# Patient Record
Sex: Female | Born: 1950 | Race: Black or African American | Hispanic: No | Smoking: Former smoker
Health system: Southern US, Community
[De-identification: ages and names within clinical notes are randomized; demographics above are authoritative.]

## PROBLEM LIST (undated history)

## (undated) DIAGNOSIS — I10 Essential (primary) hypertension: Secondary | ICD-10-CM

## (undated) DIAGNOSIS — M1990 Unspecified osteoarthritis, unspecified site: Secondary | ICD-10-CM

## (undated) DIAGNOSIS — E7800 Pure hypercholesterolemia, unspecified: Secondary | ICD-10-CM

## (undated) HISTORY — PX: TUBAL LIGATION: SHX77

## (undated) HISTORY — PX: BACK SURGERY: SHX140

---

## 2000-01-22 ENCOUNTER — Encounter: Payer: Self-pay | Admitting: Neurosurgery

## 2000-01-22 ENCOUNTER — Ambulatory Visit (HOSPITAL_COMMUNITY): Admission: RE | Admit: 2000-01-22 | Discharge: 2000-01-22 | Payer: Self-pay | Admitting: Neurosurgery

## 2000-02-05 ENCOUNTER — Encounter: Payer: Self-pay | Admitting: Neurosurgery

## 2000-02-06 ENCOUNTER — Inpatient Hospital Stay (HOSPITAL_COMMUNITY): Admission: RE | Admit: 2000-02-06 | Discharge: 2000-02-06 | Payer: Self-pay | Admitting: Neurosurgery

## 2000-03-23 ENCOUNTER — Ambulatory Visit (HOSPITAL_COMMUNITY): Admission: RE | Admit: 2000-03-23 | Discharge: 2000-03-23 | Payer: Self-pay | Admitting: Neurosurgery

## 2000-03-23 ENCOUNTER — Encounter: Payer: Self-pay | Admitting: Neurosurgery

## 2002-10-08 ENCOUNTER — Encounter: Payer: Self-pay | Admitting: Preventative Medicine

## 2002-10-08 ENCOUNTER — Ambulatory Visit (HOSPITAL_COMMUNITY): Admission: RE | Admit: 2002-10-08 | Discharge: 2002-10-08 | Payer: Self-pay | Admitting: Preventative Medicine

## 2002-11-17 ENCOUNTER — Emergency Department (HOSPITAL_COMMUNITY): Admission: EM | Admit: 2002-11-17 | Discharge: 2002-11-17 | Payer: Self-pay | Admitting: Emergency Medicine

## 2004-08-22 ENCOUNTER — Ambulatory Visit (HOSPITAL_COMMUNITY): Admission: RE | Admit: 2004-08-22 | Discharge: 2004-08-22 | Payer: Self-pay | Admitting: Family Medicine

## 2006-12-15 ENCOUNTER — Encounter (INDEPENDENT_AMBULATORY_CARE_PROVIDER_SITE_OTHER): Payer: Self-pay | Admitting: Family Medicine

## 2007-01-15 ENCOUNTER — Ambulatory Visit: Payer: Self-pay | Admitting: Family Medicine

## 2007-01-15 DIAGNOSIS — M5137 Other intervertebral disc degeneration, lumbosacral region: Secondary | ICD-10-CM | POA: Insufficient documentation

## 2007-01-15 DIAGNOSIS — M545 Low back pain, unspecified: Secondary | ICD-10-CM | POA: Insufficient documentation

## 2007-01-15 DIAGNOSIS — K5900 Constipation, unspecified: Secondary | ICD-10-CM | POA: Insufficient documentation

## 2007-01-15 DIAGNOSIS — R5383 Other fatigue: Secondary | ICD-10-CM

## 2007-01-15 DIAGNOSIS — R5381 Other malaise: Secondary | ICD-10-CM | POA: Insufficient documentation

## 2007-01-15 DIAGNOSIS — K589 Irritable bowel syndrome without diarrhea: Secondary | ICD-10-CM | POA: Insufficient documentation

## 2007-01-15 DIAGNOSIS — J42 Unspecified chronic bronchitis: Secondary | ICD-10-CM | POA: Insufficient documentation

## 2007-01-15 DIAGNOSIS — G43909 Migraine, unspecified, not intractable, without status migrainosus: Secondary | ICD-10-CM | POA: Insufficient documentation

## 2007-01-16 ENCOUNTER — Encounter (INDEPENDENT_AMBULATORY_CARE_PROVIDER_SITE_OTHER): Payer: Self-pay | Admitting: Family Medicine

## 2007-01-18 LAB — CONVERTED CEMR LAB
ALT: 19 units/L (ref 0–35)
AST: 21 units/L (ref 0–37)
Albumin: 4.5 g/dL (ref 3.5–5.2)
Alkaline Phosphatase: 147 units/L — ABNORMAL HIGH (ref 39–117)
BUN: 13 mg/dL (ref 6–23)
Basophils Absolute: 0 10*3/uL (ref 0.0–0.1)
Basophils Relative: 0 % (ref 0–1)
CO2: 24 meq/L (ref 19–32)
Calcium: 10 mg/dL (ref 8.4–10.5)
Chloride: 105 meq/L (ref 96–112)
Cholesterol: 203 mg/dL — ABNORMAL HIGH (ref 0–200)
Creatinine, Ser: 0.82 mg/dL (ref 0.40–1.20)
Eosinophils Absolute: 0.4 10*3/uL (ref 0.0–0.7)
Eosinophils Relative: 4 % (ref 0–5)
Glucose, Bld: 101 mg/dL — ABNORMAL HIGH (ref 70–99)
HCT: 40.8 % (ref 36.0–46.0)
HDL: 65 mg/dL (ref >39)
Hemoglobin: 13.2 g/dL (ref 12.0–15.0)
LDL Cholesterol: 118 mg/dL — ABNORMAL HIGH (ref 0–99)
Lymphocytes Relative: 21 % (ref 12–46)
Lymphs Abs: 1.8 10*3/uL (ref 0.7–3.3)
MCHC: 32.4 g/dL (ref 30.0–36.0)
MCV: 92.9 fL (ref 78.0–100.0)
Monocytes Absolute: 0.6 10*3/uL (ref 0.2–0.7)
Monocytes Relative: 7 % (ref 3–11)
Neutro Abs: 6 10*3/uL (ref 1.7–7.7)
Neutrophils Relative %: 68 % (ref 43–77)
Platelets: 333 10*3/uL (ref 150–400)
Potassium: 4.7 meq/L (ref 3.5–5.3)
RBC: 4.39 M/uL (ref 3.87–5.11)
RDW: 14 % (ref 11.5–14.0)
Sodium: 141 meq/L (ref 135–145)
TSH: 1.022 microintl units/mL (ref 0.350–5.50)
Total Bilirubin: 0.4 mg/dL (ref 0.3–1.2)
Total CHOL/HDL Ratio: 3.1
Total Protein: 7.7 g/dL (ref 6.0–8.3)
Triglycerides: 99 mg/dL (ref <150)
VLDL: 20 mg/dL (ref 0–40)
WBC: 8.8 10*3/uL (ref 4.0–10.5)

## 2007-01-19 ENCOUNTER — Telehealth (INDEPENDENT_AMBULATORY_CARE_PROVIDER_SITE_OTHER): Payer: Self-pay | Admitting: *Deleted

## 2007-01-24 ENCOUNTER — Emergency Department (HOSPITAL_COMMUNITY): Admission: EM | Admit: 2007-01-24 | Discharge: 2007-01-24 | Payer: Self-pay | Admitting: Emergency Medicine

## 2007-02-03 ENCOUNTER — Encounter (INDEPENDENT_AMBULATORY_CARE_PROVIDER_SITE_OTHER): Payer: Self-pay | Admitting: Family Medicine

## 2007-02-04 ENCOUNTER — Ambulatory Visit: Payer: Self-pay | Admitting: Family Medicine

## 2007-02-04 ENCOUNTER — Telehealth (INDEPENDENT_AMBULATORY_CARE_PROVIDER_SITE_OTHER): Payer: Self-pay | Admitting: *Deleted

## 2007-02-04 DIAGNOSIS — S63509A Unspecified sprain of unspecified wrist, initial encounter: Secondary | ICD-10-CM | POA: Insufficient documentation

## 2007-02-04 DIAGNOSIS — E785 Hyperlipidemia, unspecified: Secondary | ICD-10-CM | POA: Insufficient documentation

## 2007-02-04 LAB — CONVERTED CEMR LAB
Cholesterol, target level: 200 mg/dL
HDL goal, serum: 40 mg/dL
LDL Goal: 160 mg/dL

## 2007-03-24 ENCOUNTER — Telehealth (INDEPENDENT_AMBULATORY_CARE_PROVIDER_SITE_OTHER): Payer: Self-pay | Admitting: Family Medicine

## 2007-03-28 ENCOUNTER — Encounter (INDEPENDENT_AMBULATORY_CARE_PROVIDER_SITE_OTHER): Payer: Self-pay | Admitting: Family Medicine

## 2009-02-26 ENCOUNTER — Emergency Department (HOSPITAL_COMMUNITY): Admission: EM | Admit: 2009-02-26 | Discharge: 2009-02-26 | Payer: Self-pay | Admitting: Emergency Medicine

## 2009-10-24 ENCOUNTER — Emergency Department (HOSPITAL_COMMUNITY): Admission: EM | Admit: 2009-10-24 | Discharge: 2009-10-24 | Payer: Self-pay | Admitting: Emergency Medicine

## 2010-05-06 ENCOUNTER — Emergency Department (HOSPITAL_COMMUNITY)
Admission: EM | Admit: 2010-05-06 | Discharge: 2010-05-06 | Payer: Self-pay | Source: Home / Self Care | Admitting: Emergency Medicine

## 2010-05-08 NOTE — Assessment & Plan Note (Signed)
Summary: new patient/arc   Vital Signs:  Patient Profile:   59 Years Old Female Height:     66 inches Weight:      172 pounds BMI:     27.86 O2 Sat:      99 % Temp:     98.0 degrees F Pulse rate:   69 / minute Resp:     14 per minute BP sitting:   157 / 94  Vitals Entered By: Sherilyn Banker (January 15, 2007 1:33 PM)                 PCP:  Franchot Heidelberg, MD  Chief Complaint:  establish.  History of Present Illness: Pt in for establishment.  She was just certified as disabled by [employer]. She had surgery under Dr. Aliene Beams. She had multiple slipped discs and she had numbness and tingling in her left leg after. This was done in 2001. 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 This involved hard physical labor and she had to lie on her back, strecth, shvel, stoop and crawl. She was not supposed to lift more than 50 pounds. She lost this job as she could not perform functions for it. This happened Mar 10, 2007. She applied for diasbility in June of this year. She never had an FCE and never went to vocational rehab. Saw Gaurino and he did physical for disability. She brings in note from him and this shows she had chronic low back pain with left leg radiation post back surgery.  He could not speak to fact if she had maximal improvement or not. She was granted disability.  She stopped her car payments i.e. payed half for the last few months. SHe has not made payment this month. She needs me to complete statement on her disability for the insurance company and this will reliquish her from making payments.  The paperwork is for [employer] and the second is for [employer].  She now presents.  Current Allergies (reviewed today): ! ASA   Family History:    Reviewed history and no changes required:       Father: deceased, Lung CA       Mother: 3, low back pain, HTN, arthritis       3 Brothers: 59, 37, 68, HTN, Kidney problems       2  sisters: 55, 50, HTN       3 Children: 12, 35, glaucoma, 40  Social History:    Reviewed history and no changes required:       Single       Former Smoker       Alcohol use-no       Drug use-no   Risk Factors:  Tobacco use:  quit    Year quit:  1985    Comments:  1/2 pack for 12 years Drug use:  no Alcohol use:  no   Review of Systems      See HPI  General      Complains of fatigue and malaise.      She does not sleepthe best. Back hurts. States pain is 8/10. Describes as sharp,shoots down left leg. Has not had repeat MRI since then. Usually uses Excedrin. Not helping much. No fecal or urinary incontinence. Left leg numb and left foot numb as well with little to no sensation.  Eyes      Denies discharge, halos, and red eye.  ENT  Denies decreased hearing, earache, ringing in ears, and sore throat.  CV      Denies bluish discoloration of lips or nails, difficulty breathing at night, near fainting, and weight gain.  Resp      Denies chest discomfort, shortness of breath, sputum productive, and wheezing.  GI      Denies abdominal pain, constipation, diarrhea, nausea, and vomiting.      Ha snever had colonscopy  GU      Denies abnormal vaginal bleeding, nocturia, urinary frequency, and urinary hesitancy.      Has not had female exam.  Derm      Denies changes in nail beds, hair loss, and lesion(s).  Neuro      See HPI      Denies brief paralysis, falling down, memory loss, and visual disturbances.  Psych      Denies anxiety and depression.      She denies depression and anxiety. Has done well getting use to not working. Does not have all her friends. She is irritable at times. Concentration good. energy level is good. No suicidal ideations. She has kids and they listen to her - helps her.  Endo      Denies cold intolerance, excessive hunger, excessive thirst, excessive urination, heat intolerance, and polyuria.  Heme      Denies abnormal bruising,  bleeding, enlarge lymph nodes, fevers, pallor, and skin discoloration.     Impression & Recommendations:  Problem # 1:  DISC DISEASE, LUMBOSACRAL SPINE (ICD-722.52) Discussed. Get records. Start Medrol pack,flexeril and Ultram. Councelled back stretching. Advised heating pad and possible repeat MRI. Await response to medications as well as records, then optomize. Cannot complete paperwrok untilmore info available. Notes request from Dr. Jeanelle Malling. See scanned document on Dr. Wende Crease.  Problem # 2:  ANEMIA NOS (ICD-285.9) Hx of. Check labs. Orders: T-CBC w/Diff ([id number])   Problem # 3:  CONSTIPATION (ICD-564.00) Push fluids, fiber and exersize. Labs. Orders: T-Comprehensive Metabolic Panel ([id number])   Problem # 4:  Preventive Health Care (ICD-V70.0) Fl-shot today. Female exam near future as well as referal for mammogram and screening colonoscopy.  Complete Medication List: 1)  Excedrin Sinus Headache 5-325 Mg Tabs (Phenylephrine-acetaminophen) .... As needed 2)  Medrol (pak) 4 Mg Tabs (Methylprednisolone) .... As directed 3)  Flexeril 10 Mg Tabs (Cyclobenzaprine hcl) .... Three times a day 4)  Ultram 50 Mg Tabs (Tramadol hcl) .... One every 6 hours as needed  Other Orders: T-Lipid Profile [phone]) T-TSH [phone]) Flu Vaccine 46yrs + [phone]) Admin 1st Vaccine ([id number])   Patient Instructions: 1)  Please schedule a follow-up appointment in 2 weeks.    Prescriptions: ULTRAM 50 MG  TABS (TRAMADOL HCL) One every 6 hours as needed  #60 x 0   Entered and Authorized by:   Franchot Heidelberg MD   Signed by:   Franchot Heidelberg MD on 01/15/2007   Method used:   Print then Give to Patient   RxID:   [id number] FLEXERIL 10 MG  TABS (CYCLOBENZAPRINE HCL) three times a day  #15 x 0   Entered and Authorized by:   Franchot Heidelberg MD   Signed by:   Franchot Heidelberg MD on 01/15/2007   Method used:   Print then Give to Patient   RxID:   [id number]  MEDROL (PAK) 4 MG  TABS (METHYLPREDNISOLONE) As directed  #1 x 0   Entered and Authorized by:   Franchot Heidelberg MD   Signed by:   Franchot Heidelberg MD on 01/15/2007  Method used:   Print then Give to Patient   RxID:   [id number]  ]  Hepatitis B Immunization History:    Hep B # 1:  Historical (01/06/2001)  Tetanus/Td Immunization History:    Tetanus/Td # 1:  Historical (01/07/2003)  Hepatitis A Immunization History:    Hep A # 1:  Historical (01/06/2001)     Influenza Vaccine    Vaccine Type: Fluvax 3+    Site: left deltoid    Mfr: novartis    Dose: 0.5 ml    Route: IM    Given by: Sherilyn Banker    Exp. Date: 08/07/2007    VIS given: 10/05/04 version given January 15, 2007.  Flu Vaccine Consent Questions    Do you have a history of severe allergic reactions to this vaccine? no    Any prior history of allergic reactions to egg and/or gelatin? no    Do you have a sensitivity to the preservative Thimersol? no    Do you have a past history of Guillan-Barre Syndrome? no    Do you currently have an acute febrile illness? no    Have you ever had a severe reaction to latex? no    Vaccine information given and explained to patient? yes    Are you currently pregnant? no

## 2010-05-08 NOTE — Assessment & Plan Note (Signed)
Summary: 2 week follow up/arc   Vital Signs:  Patient Profile:   60 Years Old Female Height:     66 inches Weight:      173 pounds BMI:     28.02 O2 Sat:      97 % Temp:     97.1 degrees F Pulse rate:   75 / minute Resp:     12 per minute BP sitting:   135 / 89  Vitals Entered By: Sherilyn Banker (February 04, 2007 2:17 PM)                 PCP:  Franchot Heidelberg, MD  Chief Complaint:  Lwrist pain.  History of Present Illness: Pt in for follow-up.  She notes she is doing well.  She had labs after last visit and this is reviewed:  1. CBC - normal 2. CMP - Alk Phos 147, Glucose 101 3. TSH - normal 4. Lipids - see below  She is disabled due to her DDD. She had surgery under Dr. Gerlene Fee in 2000. She was relaed back to work. She never went bck to see him once she developed problems. She could not do her job stooping and lifting at the [employer] and hence applied for disability. She had a diasbility physical and the states gave her disability Mar 11, 2007. She needs paperwork completed. This is for [employer]. She has a car payment and this is for [employer]. She states she has made payment but has now fell behind due to her disability.   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 She is not sure what she can do. Has not had an FCE eval and has not seen vocationbal rehab.  She now presents.       Lipid Management History:      Positive NCEP/ATP III risk factors include female age 81 years old or older.  Negative NCEP/ATP III risk factors include no history of early menopause without estrogen hormone replacement, non-diabetic, HDL cholesterol greater than 60, no family history for ischemic heart disease, non-tobacco-user status, non-hypertensive, no ASHD (atherosclerotic heart disease), no prior stroke/TIA, no peripheral vascular disease, and no history of aortic aneurysm.     Current  Allergies (reviewed today): ! ASA  Past Surgical History:    Reviewed history and no changes required:       Laminectomy L5 and S1 with microdissection L5-S1   Family History:    Reviewed history from 01/15/2007 and no changes required:       Father: deceased, Lung CA       Mother: 41, low back pain, HTN, arthritis       3 Brothers: 14, 2, 58, HTN, Kidney problems       2 sisters: 107, 78, HTN       3 Children: 47, 35, glaucoma, 40  Social History:    Reviewed history from 01/15/2007 and no changes required:       Single       Former Smoker       Alcohol use-no       Drug use-no    Review of Systems  General      Denies fatigue and malaise.  Resp      Denies chest discomfort, shortness of breath, sputum productive, and wheezing.  GI      Denies abdominal pain, constipation, diarrhea, nausea, and vomiting.  GU  Denies abnormal vaginal bleeding, nocturia, urinary frequency, and urinary hesitancy.  MS      She fell a few weeks ago.Went to [facility]. Had xrays. Told all was well and she just had sprain. Started pain med and brace and this has helped.States pain is worse with flexion and better with pain medication.   Physical Exam  General:     Well-developed,well-nourished,in no acute distress; alert,appropriate and cooperative throughout examination Lungs:     Normal respiratory effort, chest expands symmetrically. Lungs are clear to auscultation, no crackles or wheezes. Heart:     Normal rate and regular rhythm. S1 and S2 normal without gallop, murmur, click, rub or other extra sounds. Abdomen:     Bowel sounds positive,abdomen soft and non-tender without masses, organomegaly or hernias noted. Extremities:     No clubbing, cyanosis, edema, or deformity noted with normal full range of motion of all joints.  Left witrst exam done.Good ROM. Pain on flexion and extension and medial rotation. Normal cap fill and reflexes. Neurologic:     No cranial nerve  deficits noted. Station and gait are normal. Plantar reflexes are down-going bilaterally. DTRs are symmetrical throughout. Sensory, motor and coordinative functions appear intact. Psych:     Cognition and judgment appear intact. Alert and cooperative with normal attention span and concentration. No apparent delusions, illusions, hallucinations    Impression & Recommendations:  Problem # 1:  DISC DISEASE, LUMBOSACRAL SPINE (ICD-722.52) Discussed. I have seen her twice. She needs paperwork completed to help her with her car payments. She has fallen behind since she got disabled. I advised her I have only seen her twice. We will fill paoperwork out as best I can. I feel however she is a good candidate for vocational rehab and we will get an FCE eval to see what her level of impairment is. She agrees. I councelled her on diet, exersize. Await FCE and optomize.  Problem # 2:  HYPERLIPIDEMIA (ICD-272.4) TLC only. Recheck one year.  Problem # 3:  WRIST SPRAIN, LEFT (ICD-842.00) Improved.Normal films per Echart review. Advised left wrist brace, ROM exersizes and need for heat. DC Narcotic and switch to naproxen 500 mg two times a day for ten days given need for ant-inflammatory action. Agrees.  Problem # 4:  Preventive Health Care (ICD-V70.0) Female exam in 4 weeks. Note up to date for flu-shot. She needs breast exam, mammo, pelvic, dexa based on elevated alk phos and screening colonoscopy. Agrees.  Complete Medication List: 1)  Excedrin Sinus Headache 5-325 Mg Tabs (Phenylephrine-acetaminophen) .... As needed 2)  Medrol (pak) 4 Mg Tabs (Methylprednisolone) .... As directed 3)  Flexeril 10 Mg Tabs (Cyclobenzaprine hcl) .... Three times a day 4)  Ultram 50 Mg Tabs (Tramadol hcl) .... One every 6 hours as needed  Lipid Assessment/Plan:      Based on NCEP/ATP III, the patient's risk factor category is "0-1 risk factors".  From this information, the patient's calculated lipid goals are as follows:  Total cholesterol goal is 200; LDL cholesterol goal is 160; HDL cholesterol goal is 40; Triglyceride goal is 150.     Patient Instructions: 1)  Please schedule a follow-up appointment in 1 month.    ]    Preventive Care Screening  Last Flu Shot:    Next Due:  01/2008  Last Tetanus Booster:    Next Due:  01/2013

## 2010-05-08 NOTE — Letter (Signed)
Summary: diablity forms  diablity forms   Imported By: Donneta Romberg 04/01/2007 11:36:10  _____________________________________________________________________  External Attachment:    Type:   Image     Comment:   External Document

## 2010-05-08 NOTE — Progress Notes (Signed)
Summary: FCE referral  Phone Note Outgoing Call   Call placed by: Sonny Dandy,  February 04, 2007 3:22 PM Summary of Call: order faxed to PT and patient notified .................................................................Marland KitchenMarland KitchenSonny Dandy  February 04, 2007 3:22 PM  Initial call taken by: Sonny Dandy,  February 04, 2007 3:23 PM

## 2010-05-08 NOTE — Progress Notes (Signed)
Summary: Disability  Phone Note Other Incoming   Summary of Call: patient came to office asking about paperwork to deam her disabled. Brought paperwork for physician to fill out to say that she was disabled. I contacted patient to let her know that due to the fact that she didn't go for her [facility] evaluation he can not deam that she is disabled. This evaluation has to be done for further paperwork can be filled out.  patient verbalized understanding. Initial call taken by: Donneta Romberg,  March 24, 2007 4:54 PM  Follow-up for Phone Call        Agree. Follow-up by: Franchot Heidelberg MD,  March 24, 2007 4:58 PM

## 2010-05-08 NOTE — Letter (Signed)
Summary: Evaluation form for disability determination services  Evaluation form for disability determination services   Imported By: Pablo Lawrence 01/19/2007 16:38:57  _____________________________________________________________________  External Attachment:    Type:   Image     Comment:   External Document

## 2010-05-08 NOTE — Miscellaneous (Signed)
Summary: Orders Update   Clinical Lists Changes  Orders: Added new Referral order of Physical Therapy Referral (PT) - Signed 

## 2010-05-08 NOTE — Letter (Signed)
Summary: letter for disablity   letter for disablity   Imported By: Donneta Romberg 04/01/2007 11:35:46  _____________________________________________________________________  External Attachment:    Type:   Image     Comment:   External Document

## 2010-05-08 NOTE — Letter (Signed)
Summary: Neuro Surgery-Dr. Gerlene Fee  Neuro Surgery-Dr. Gerlene Fee   Imported By: Lutricia Horsfall 02/03/2007 15:50:40  _____________________________________________________________________  External Attachment:    Type:   Image     Comment:   External Document

## 2010-05-08 NOTE — Letter (Signed)
Summary: DISABLITY BENEFIT INFO  DISABLITY BENEFIT INFO   Imported By: Donneta Romberg 02/10/2007 10:17:29  _____________________________________________________________________  External Attachment:    Type:   Image     Comment:   External Document

## 2010-05-08 NOTE — Progress Notes (Signed)
Summary: 01/16/07 lab results  Phone Note Outgoing Call   Call placed by: Sonny Dandy,  January 19, 2007 12:42 PM Summary of Call: Results given to patient, voices understanding .................................................................Marland KitchenMarland KitchenSonny Dandy  January 19, 2007 12:42 PM   Initial call taken by: Sonny Dandy,  January 19, 2007 12:42 PM

## 2010-06-23 LAB — URINALYSIS, ROUTINE W REFLEX MICROSCOPIC
Bilirubin Urine: NEGATIVE
Glucose, UA: NEGATIVE mg/dL
Hgb urine dipstick: NEGATIVE
Ketones, ur: NEGATIVE mg/dL
Nitrite: NEGATIVE
Protein, ur: 30 mg/dL — AB
Specific Gravity, Urine: >1.03 — ABNORMAL HIGH (ref 1.005–1.030)
Urobilinogen, UA: 0.2 mg/dL (ref 0.0–1.0)
pH: 5.5 (ref 5.0–8.0)

## 2010-06-23 LAB — CBC
HCT: 38.5 % (ref 36.0–46.0)
Hemoglobin: 13.1 g/dL (ref 12.0–15.0)
MCH: 30.8 pg (ref 26.0–34.0)
MCHC: 33.9 g/dL (ref 30.0–36.0)
MCV: 90.7 fL (ref 78.0–100.0)
Platelets: 257 10*3/uL (ref 150–400)
RBC: 4.24 MIL/uL (ref 3.87–5.11)
RDW: 13.9 % (ref 11.5–15.5)
WBC: 11.6 10*3/uL — ABNORMAL HIGH (ref 4.0–10.5)

## 2010-06-23 LAB — BASIC METABOLIC PANEL
BUN: 10 mg/dL (ref 6–23)
BUN: 9 mg/dL (ref 6–23)
CO2: 25 mEq/L (ref 19–32)
CO2: 26 mEq/L (ref 19–32)
Calcium: 9.1 mg/dL (ref 8.4–10.5)
Calcium: 9.9 mg/dL (ref 8.4–10.5)
Chloride: 103 mEq/L (ref 96–112)
Chloride: 109 mEq/L (ref 96–112)
Creatinine, Ser: 1.05 mg/dL (ref 0.4–1.2)
Creatinine, Ser: 1.34 mg/dL — ABNORMAL HIGH (ref 0.4–1.2)
GFR calc Af Amer: 49 mL/min — ABNORMAL LOW (ref >60)
GFR calc Af Amer: >60 mL/min (ref >60)
GFR calc non Af Amer: 40 mL/min — ABNORMAL LOW (ref >60)
GFR calc non Af Amer: 53 mL/min — ABNORMAL LOW (ref >60)
Glucose, Bld: 125 mg/dL — ABNORMAL HIGH (ref 70–99)
Glucose, Bld: 99 mg/dL (ref 70–99)
Potassium: 3.8 mEq/L (ref 3.5–5.1)
Potassium: 4.1 mEq/L (ref 3.5–5.1)
Sodium: 138 mEq/L (ref 135–145)
Sodium: 140 mEq/L (ref 135–145)

## 2010-06-23 LAB — URINE MICROSCOPIC-ADD ON

## 2010-06-23 LAB — DIFFERENTIAL
Basophils Absolute: 0 10*3/uL (ref 0.0–0.1)
Basophils Relative: 0 % (ref 0–1)
Eosinophils Absolute: 0.4 10*3/uL (ref 0.0–0.7)
Eosinophils Relative: 4 % (ref 0–5)
Lymphocytes Relative: 13 % (ref 12–46)
Lymphs Abs: 1.5 10*3/uL (ref 0.7–4.0)
Monocytes Absolute: 0.8 10*3/uL (ref 0.1–1.0)
Monocytes Relative: 7 % (ref 3–12)
Neutro Abs: 8.8 10*3/uL — ABNORMAL HIGH (ref 1.7–7.7)
Neutrophils Relative %: 76 % (ref 43–77)

## 2010-06-23 LAB — POCT CARDIAC MARKERS
CKMB, poc: 1 ng/mL (ref 1.0–8.0)
Myoglobin, poc: 355 ng/mL (ref 12–200)
Troponin i, poc: <0.05 ng/mL (ref 0.00–0.09)

## 2010-06-23 LAB — URINE CULTURE: Colony Count: 80000

## 2010-07-22 ENCOUNTER — Ambulatory Visit (HOSPITAL_COMMUNITY): Payer: Medicare Other

## 2010-08-24 NOTE — Op Note (Signed)
[place]  Patient:    Kathryn Woods, Kathryn Woods                     MRN: [id number] Proc. Date: 02/05/00 Adm. Date:  [id number] Attending:  Gerald Dexter                           Operative Report  PREOPERATIVE DIAGNOSIS:  Herniated disk L5-S1 left.  POSTOPERATIVE DIAGNOSIS:  Herniated disk L5-S1 left.  PROCEDURE: 1. Left L5-S1 interlaminar laminotomy for excision of herniated disk without    operating microscope. 2. Microdissection L5-S1 disk and S1 nerve root.  SURGEON:  Reinaldo Meeker, M.D.  ASSISTANT:  Julio Sicks, M.D.  PROCEDURE IN DETAIL:  After being placed in the prone position, the patients back was prepped and draped in the usual sterile fashion.  A localizing x-ray was taken prior to incision to identify the L5-S1 level.  A midline incision was made over the spinous process of L5-S1.  Using the Bovie cautery and curet the incision was carried down to the spinous processes.  Subperiosteal dissection was then carried out on the left side of the spinous processes and lamina and the McCullough self-retaining retractor was placed for exposure.  A second x-ray was taken to confirm approach to the L5-S1 level, and this was correct.  Using the high speed drill, the inferior one third of the L5 lamina and the medial one third of the facet joint were removed.  The drill was then used to remove the superior one third of the S1 lamina.  Any residual bone and ligamentum flavum were removed in a piecemeal fashion.  The microscope was then draped, brought into the field and used for the remainder of the case. Using microdissection technique, the lateral aspect of the thecal sac and S1 nerve were identified.  Further coagulation was carried out down the ______ canal to identify the L5-S1 disk which was markedly herniated with extension into the superior area towards the foramen.  The annulus of the disk was incised.  Using pituitary rongeurs and  curets a thorough disk space clean out was carried out.  Inspection in the superolateral direction above the disk space showed 2 large fragments of subligamentous disk and these were removed to give excellent decompression of the thecal sac and L5, and S1 nerve roots.  At this point inspection was carried out in all directions for any evidence of residual compression and none could be identified.  Large amounts of irrigation were carried out and bleeding controlled with bipolar coagulation and Gelfoam.  The wound was then closed using interrupted Vicryl in the muscle, fascia, subcutaneous and subcuticular tissues and staples on the skin.  A sterile dressing was then applied.  The patient was extubated and taken to the recovery room in stable condition. DD:  02/05/00 TD:  02/05/00 Job: [id number]

## 2010-09-09 ENCOUNTER — Emergency Department (HOSPITAL_COMMUNITY)
Admission: EM | Admit: 2010-09-09 | Discharge: 2010-09-09 | Disposition: A | Discharge status: Home / Self Care | Payer: No Typology Code available for payment source | Attending: Emergency Medicine | Admitting: Emergency Medicine

## 2010-09-09 ENCOUNTER — Emergency Department (HOSPITAL_COMMUNITY): Admission: EM | Admit: 2010-09-09 | Payer: Medicare Other | Source: Home / Self Care

## 2010-09-09 DIAGNOSIS — M542 Cervicalgia: Secondary | ICD-10-CM | POA: Insufficient documentation

## 2010-09-09 DIAGNOSIS — M25519 Pain in unspecified shoulder: Secondary | ICD-10-CM | POA: Insufficient documentation

## 2010-09-09 DIAGNOSIS — I10 Essential (primary) hypertension: Secondary | ICD-10-CM | POA: Insufficient documentation

## 2010-09-09 DIAGNOSIS — S139XXA Sprain of joints and ligaments of unspecified parts of neck, initial encounter: Secondary | ICD-10-CM | POA: Insufficient documentation

## 2010-12-30 ENCOUNTER — Emergency Department (HOSPITAL_COMMUNITY)
Admission: EM | Admit: 2010-12-30 | Discharge: 2010-12-30 | Disposition: A | Discharge status: Home / Self Care | Payer: Medicare PPO | Attending: Emergency Medicine | Admitting: Emergency Medicine

## 2010-12-30 ENCOUNTER — Encounter: Payer: Self-pay | Admitting: *Deleted

## 2010-12-30 DIAGNOSIS — Z87891 Personal history of nicotine dependence: Secondary | ICD-10-CM | POA: Insufficient documentation

## 2010-12-30 DIAGNOSIS — L0231 Cutaneous abscess of buttock: Secondary | ICD-10-CM | POA: Insufficient documentation

## 2010-12-30 MED ORDER — DOXYCYCLINE HYCLATE 100 MG PO CAPS: 100.0000 mg | ORAL_CAPSULE | Freq: Two times a day (BID) | ORAL | Status: AC

## 2010-12-30 MED ORDER — HYDROCODONE-ACETAMINOPHEN 5-325 MG PO TABS: ORAL_TABLET | ORAL | Status: DC

## 2010-12-30 MED ORDER — IBUPROFEN 800 MG PO TABS
800.0000 mg | ORAL_TABLET | Freq: Once | ORAL | Status: AC
  Administered 2010-12-30: 800 mg via ORAL
  Filled 2010-12-30: qty 1

## 2010-12-30 MED ORDER — DOXYCYCLINE HYCLATE 100 MG PO TABS
100.0000 mg | ORAL_TABLET | Freq: Once | ORAL | Status: AC
  Administered 2010-12-30: 100 mg via ORAL
  Filled 2010-12-30: qty 1

## 2010-12-30 NOTE — ED Provider Notes (Signed)
History     CSN: [id number] Arrival date & time: 12/30/2010 10:19 AM  Chief Complaint  Patient presents with  . Abscess    HPI  (Consider location/radiation/quality/duration/timing/severity/associated sxs/prior treatment)  Patient is a 60 y.o. female presenting with abscess. The history is provided by the patient. No language interpreter was used.  Abscess  This is a new problem. Episode onset: started 5 days ago. The onset was gradual. The problem has been gradually worsening. The abscess is present on the right buttock. The problem is moderate. The abscess is characterized by swelling (redness and induration). Pertinent negatives include no fever. Her past medical history does not include skin abscesses in family. There were no sick contacts. She has received no recent medical care.    History reviewed. No pertinent past medical history.  Past Surgical History  Procedure Date  . Back surgery     History reviewed. No pertinent family history.  History  Substance Use Topics  . Smoking status: Former Games developer  . Smokeless tobacco: Not on file  . Alcohol Use: No    OB History    Grav Para Term Preterm Abortions TAB SAB Ect Mult Living                  Review of Systems  Review of Systems  Constitutional: Negative for fever.  Skin:       abscess  All other systems reviewed and are negative.    Allergies  Aspirin  Home Medications   Current Outpatient Rx  Name Route Sig Dispense Refill  . IBUPROFEN 800 MG PO TABS Oral Take 800 mg by mouth every 8 (eight) hours as needed. For pain       Physical Exam    BP 132/90  Pulse 86  Temp(Src) 97.5 F (36.4 C) (Oral)  Resp 18  Ht 5\' 6"  (1.676 m)  Wt 158 lb (71.668 kg)  BMI 25.50 kg/m2  SpO2 97%  Physical Exam  Nursing note and vitals reviewed. Constitutional: She is oriented to person, place, and time. She appears well-developed and well-nourished. No distress.  HENT:  Head: Normocephalic and atraumatic.    Eyes: EOM are normal.  Neck: Normal range of motion.  Cardiovascular: Normal rate, regular rhythm and normal heart sounds.   Pulmonary/Chest: Effort normal and breath sounds normal.  Abdominal: Soft. She exhibits no distension. There is no tenderness.  Musculoskeletal: Normal range of motion. She exhibits tenderness.  Neurological: She is alert and oriented to person, place, and time.  Skin: Skin is warm and dry. Lesion noted. She is not diaphoretic.     Psychiatric: She has a normal mood and affect. Judgment normal.    ED Course  Procedures (including critical care time)  Labs Reviewed - No data to display No results found.   No diagnosis found.   MDM         Worthy Rancher, PA 12/30/10 1110

## 2010-12-30 NOTE — ED Notes (Signed)
Pt c/o abscess to right buttock; pt states it is painful and has caused her to not be able to sleep

## 2010-12-30 NOTE — ED Provider Notes (Signed)
Medical screening examination/treatment/procedure(s) were performed by non-physician practitioner and as supervising physician I was immediately available for consultation/collaboration.   Shelda Jakes, MD 12/30/10 [phone]

## 2013-02-15 ENCOUNTER — Emergency Department (HOSPITAL_COMMUNITY): Payer: Medicare Other

## 2013-02-15 ENCOUNTER — Encounter (HOSPITAL_COMMUNITY): Payer: Self-pay | Admitting: Emergency Medicine

## 2013-02-15 ENCOUNTER — Emergency Department (HOSPITAL_COMMUNITY)
Admission: EM | Admit: 2013-02-15 | Discharge: 2013-02-15 | Disposition: A | Discharge status: Home / Self Care | Payer: Medicare Other | Attending: Emergency Medicine | Admitting: Emergency Medicine

## 2013-02-15 DIAGNOSIS — M25511 Pain in right shoulder: Secondary | ICD-10-CM

## 2013-02-15 DIAGNOSIS — Z9889 Other specified postprocedural states: Secondary | ICD-10-CM | POA: Insufficient documentation

## 2013-02-15 DIAGNOSIS — M25519 Pain in unspecified shoulder: Secondary | ICD-10-CM | POA: Insufficient documentation

## 2013-02-15 DIAGNOSIS — Z87891 Personal history of nicotine dependence: Secondary | ICD-10-CM | POA: Insufficient documentation

## 2013-02-15 MED ORDER — NAPROXEN 375 MG PO TABS: 375.0000 mg | ORAL_TABLET | Freq: Two times a day (BID) | ORAL | Status: DC | PRN

## 2013-02-15 MED ORDER — DIAZEPAM 5 MG PO TABS: 5.0000 mg | ORAL_TABLET | Freq: Three times a day (TID) | ORAL | Status: DC | PRN

## 2013-02-15 MED ORDER — HYDROCODONE-ACETAMINOPHEN 5-325 MG PO TABS: 1.0000 | ORAL_TABLET | ORAL | Status: DC | PRN

## 2013-02-15 NOTE — ED Notes (Signed)
Patient with no complaints at this time. Respirations even and unlabored. Skin warm/dry. Discharge instructions reviewed with patient at this time. Patient given opportunity to voice concerns/ask questions. Patient discharged at this time and left Emergency Department with steady gait.   

## 2013-02-15 NOTE — ED Provider Notes (Signed)
CSN: [id number]     Arrival date & time 02/15/13  0710 History  This chart was scribed for Raeford Razor, MD by Bennett Scrape, ED Scribe. This patient was seen in room APA19/APA19 and the patient's care was started at 7:49 AM.    Chief Complaint  Patient presents with  . Shoulder Pain    The history is provided by the patient. No language interpreter was used.    HPI Comments: Kathryn Woods is a 62 y.o. female who presents to the Emergency Department complaining of gradual onset right shoulder pain that has been persistent and gradually worsening for the past 3 months. The pain is described as a "deep" dull and throbbing ache that radiates into her right neck and shoots down her arm with movement. She reports associated decreased ROM secondary to pain. She denies taking any OTC medications to improve her symptoms. 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 She states that she used to work a job with repetitive heavy lifting. She has no other complaints currently and denies numbness or weakness. She has no h/o chronic medical conditions.    History reviewed. No pertinent past medical history. Past Surgical History  Procedure Laterality Date  . Back surgery     No family history on file. History  Substance Use Topics  . Smoking status: Former Games developer  . Smokeless tobacco: Not on file  . Alcohol Use: No  Pt used to work as a Scientist, product/process development  No OB history provided.  Review of Systems  Musculoskeletal: Positive for arthralgias.  Neurological: Negative for weakness and numbness.  All other systems reviewed and are negative.    Allergies  Aspirin-confirmed by pt at bedside  Home Medications   Current Outpatient Rx  Name  Route  Sig  Dispense  Refill  . HYDROcodone-acetaminophen (NORCO) 5-325 MG per tablet      One po q 4-6 hrs prn pain   20 tablet   0   . ibuprofen (ADVIL,MOTRIN) 800 MG  tablet   Oral   Take 800 mg by mouth every 8 (eight) hours as needed. For pain           Triage Vitals: BP 121/77  Pulse 71  Temp(Src) 98.1 F (36.7 C) (Oral)  Resp 18  Ht 5\' 6"  (1.676 m)  Wt 162 lb (73.483 kg)  BMI 26.16 kg/m2  SpO2 96%  Physical Exam  Nursing note and vitals reviewed. Constitutional: She is oriented to person, place, and time. She appears well-developed and well-nourished. No distress.  HENT:  Head: Normocephalic and atraumatic.  Eyes: EOM are normal.  Neck: Normal range of motion.  Cardiovascular: Normal rate, regular rhythm and normal heart sounds.   Pulmonary/Chest: Effort normal and breath sounds normal.  Abdominal: She exhibits no distension.  Musculoskeletal:  Right shoulder is symmetric compared to the left. No swelling or concerning skin changes. Mild TTP over the anterior and lateral left shoulder. Increased pain with ROM particularly with abduction. Unable to abduct beyond 70 degrees secondary to pain. NVI distally   Neurological: She is alert and oriented to person, place, and time.  Skin: Skin is warm and dry.  Psychiatric: She has a normal mood and affect. Judgment normal.    ED Course  Procedures (including critical care time)  Meds ordered this encounter  Medications  . HYDROcodone-acetaminophen (NORCO/VICODIN) 5-325 MG per tablet    Sig: Take 1  tablet by mouth every 4 (four) hours as needed.    Dispense:  20 tablet    Refill:  0  . naproxen (NAPROSYN) 375 MG tablet    Sig: Take 1 tablet (375 mg total) by mouth 2 (two) times daily as needed (pain).    Dispense:  20 tablet    Refill:  0  . diazepam (VALIUM) 5 MG tablet    Sig: Take 1 tablet (5 mg total) by mouth every 8 (eight) hours as needed (muscle spasm).    Dispense:  12 tablet    Refill:  0   DIAGNOSTIC STUDIES: Oxygen Saturation is 96% on room air, adequate by my interpretation.    COORDINATION OF CARE: 7:51 AM-Advised pt that her symptoms are most likely rotator cuff  related. Discussed treatment plan which includes review of shoulder x-ray with pt at bedside and pt agreed to plan. Informed pt that discharge plan with be pain medications and advised pt to follow up with an orthopedist to discuss steroid injections. Pt drove herself to the ED.   Labs Review Labs Reviewed - No data to display Imaging Review Dg Shoulder Right  02/15/2013   CLINICAL DATA:  Shoulder pain  EXAM: RIGHT SHOULDER - 2+ VIEW  COMPARISON:  None.  FINDINGS: There is no evidence of fracture or dislocation. There is no evidence of arthropathy or other focal bone abnormality. Soft tissues are unremarkable.  IMPRESSION: No acute bony findings.   Electronically Signed   By: Kennith Center M.D.   On: 02/15/2013 07:47    EKG Interpretation   None       MDM   1. Right shoulder pain    62yF with atraumatic R shoulder pain. Doubt septic joint or other potentially emergent pathology. Symptomatic tx. Outpt FU.   I personally preformed the services scribed in my presence. The recorded information has been reviewed is accurate. Raeford Razor, MD.    Raeford Razor, MD 02/18/13 [phone]

## 2013-02-15 NOTE — ED Notes (Signed)
Patient reports right shoulder pain x 3 months with progression of intensity. CMS intact. Patient has difficulty with rotating movements due to pain.

## 2013-02-24 ENCOUNTER — Ambulatory Visit (INDEPENDENT_AMBULATORY_CARE_PROVIDER_SITE_OTHER): Payer: Medicare Other | Admitting: Orthopedic Surgery

## 2013-02-24 ENCOUNTER — Encounter: Payer: Self-pay | Admitting: Orthopedic Surgery

## 2013-02-24 VITALS — BP 142/94 | Ht 66.0 in | Wt 162.0 lb

## 2013-02-24 DIAGNOSIS — M67919 Unspecified disorder of synovium and tendon, unspecified shoulder: Secondary | ICD-10-CM

## 2013-02-24 DIAGNOSIS — M7550 Bursitis of unspecified shoulder: Secondary | ICD-10-CM | POA: Insufficient documentation

## 2013-02-24 DIAGNOSIS — M7551 Bursitis of right shoulder: Secondary | ICD-10-CM

## 2013-02-24 MED ORDER — HYDROCODONE-ACETAMINOPHEN 5-325 MG PO TABS: 1.0000 | ORAL_TABLET | ORAL | Status: DC | PRN

## 2013-02-24 MED ORDER — IBUPROFEN 800 MG PO TABS: 800.0000 mg | ORAL_TABLET | Freq: Three times a day (TID) | ORAL | Status: DC | PRN

## 2013-02-24 NOTE — Patient Instructions (Addendum)
Wear sling when you can for 2 weeks  Take medication as ordered  REST   You have received a steroid shot. 15% of patients experience increased pain at the injection site with in the next 24 hours. This is best treated with ice and tylenol extra strength 2 tabs every 8 hours. If you are still having pain please call the office.  Impingement Syndrome, Rotator Cuff, Bursitis  Impingement syndrome is a condition that involves inflammation of the tendons of the rotator cuff and the subacromial bursa, that causes pain in the shoulder. The rotator cuff consists of four tendons and muscles that control much of the shoulder and upper arm function. The subacromial bursa is a fluid filled sac that helps reduce friction between the rotator cuff and one of the bones of the shoulder (acromion). Impingement syndrome is usually an overuse injury that causes swelling of the bursa (bursitis), swelling of the tendon (tendonitis), and/or a tear of the tendon (strain). Strains are classified into three categories. Grade 1 strains cause pain, but the tendon is not lengthened. Grade 2 strains include a lengthened ligament, due to the ligament being stretched or partially ruptured. With grade 2 strains there is still function, although the function may be decreased. Grade 3 strains include a complete tear of the tendon or muscle, and function is usually impaired. SYMPTOMS   Pain around the shoulder, often at the outer portion of the upper arm.  Pain that gets worse with shoulder function, especially when reaching overhead or lifting.  Sometimes, aching when not using the arm.  Pain that wakes you up at night.  Sometimes, tenderness, swelling, warmth, or redness over the affected area.  Loss of strength.  Limited motion of the shoulder, especially reaching behind the back (to the back pocket or to unhook bra) or across your body.  Crackling sound (crepitation) when moving the arm.  Biceps tendon pain and  inflammation (in the front of the shoulder). Worse when bending the elbow or lifting. CAUSES  Impingement syndrome is often an overuse injury, in which chronic (repetitive) motions cause the tendons or bursa to become inflamed. A strain occurs when a force is paced on the tendon or muscle that is greater than it can withstand. Common mechanisms of injury include: Stress from sudden increase in duration, frequency, or intensity of training.  Direct hit (trauma) to the shoulder.  Aging, erosion of the tendon with normal use.  Bony bump on shoulder (acromial spur). RISK INCREASES WITH:  Contact sports (football, wrestling, boxing).  Throwing sports (baseball, tennis, volleyball).  Weightlifting and bodybuilding.  Heavy labor.  Previous injury to the rotator cuff, including impingement.  Poor shoulder strength and flexibility.  Failure to warm up properly before activity.  Inadequate protective equipment.  Old age.  Bony bump on shoulder (acromial spur). PREVENTION   Warm up and stretch properly before activity.  Allow for adequate recovery between workouts.  Maintain physical fitness:  Strength, flexibility, and endurance.  Cardiovascular fitness.  Learn and use proper exercise technique. PROGNOSIS  If treated properly, impingement syndrome usually goes away within 6 weeks. Sometimes surgery is required.  RELATED COMPLICATIONS   Longer healing time if not properly treated, or if not given enough time to heal.  Recurring symptoms, that result in a chronic condition.  Shoulder stiffness, frozen shoulder, or loss of motion.  Rotator cuff tendon tear.  Recurring symptoms, especially if activity is resumed too soon, with overuse, with a direct blow, or when using poor technique. TREATMENT  Treatment first involves the use of ice and medicine, to reduce pain and inflammation. The use of strengthening and stretching exercises may help reduce pain with activity. These  exercises may be performed at home or with a therapist. If non-surgical treatment is unsuccessful after more than 6 months, surgery may be advised. After surgery and rehabilitation, activity is usually possible in 3 months.  MEDICATION  If pain medicine is needed, nonsteroidal anti-inflammatory medicines (aspirin and ibuprofen), or other minor pain relievers (acetaminophen), are often advised.  Do not take pain medicine for 7 days before surgery.  Prescription pain relievers may be given, if your caregiver thinks they are needed. Use only as directed and only as much as you need.  Corticosteroid injections may be given by your caregiver. These injections should be reserved for the most serious cases, because they may only be given a certain number of times. HEAT AND COLD  Cold treatment (icing) should be applied for 10 to 15 minutes every 2 to 3 hours for inflammation and pain, and immediately after activity that aggravates your symptoms. Use ice packs or an ice massage.  Heat treatment may be used before performing stretching and strengthening activities prescribed by your caregiver, physical therapist, or athletic trainer. Use a heat pack or a warm water soak. SEEK MEDICAL CARE IF:   Symptoms get worse or do not improve in 4 to 6 weeks, despite treatment.  New, unexplained symptoms develop. (Drugs used in treatment may produce side effects.)

## 2013-02-24 NOTE — Progress Notes (Signed)
Patient ID: Kathryn Woods, female   DOB: February 03, 1951, 62 y.o.   MRN: [id number]  Chief Complaint  Patient presents with  . Shoulder Pain    ER follow up right shoulder pain, no injury   HISTORY: This is a 62 year old female comes to [place] from the emergency room and followup for 4 month history of right shoulder pain without trauma no previous treatment other than ibuprofen Valium and Norco from the ER. She presents with gradual onset of atraumatic pain in the right shoulder described as dull, throbbing with intensity of 10 out of 10. The symptoms come and go worse at night better with massage worse with certain motions associated with some numbness and tingling above the elbow and not into the neck review of systems constipation frequency itching bruises easily seasonal allergies  Allergic to aspirin  Back surgery 2001 microdiscectomy  Takes no chronic medications her meds family history arthritis, cancer, lung disease, diabetes  Social history married disabled because of her back surgery. Does not smoke or drink. She cares for her husband who is in the hospital do home to stay and he is full care. She also takes her grandchildren to the [place] take scare them while her daughter works  Vital signs BP 142/94  Ht 5\' 6"  (1.676 m)  Wt 162 lb (73.483 kg)  BMI 26.16 kg/m2   General appearance: Development, nutrition are normal. Body habitus normal No gross deformities are noted and grooming normal.  Peripheral vascular system no swelling or varicose veins are noted and pulses are palpable without tenderness, temperature warm to touch no edema.  No palpable lymph nodes are noted in the cervical area or axillae.  The skin overlying the right and left shoulder cervical and thoracic spine is normal without rash, lesion or ulceration  Deep tendon reflexes are normal and equal. And pathologic reflexes such as Hoffman sign are negative.  Sensation remains normal.  The patient is oriented to  person place and time, the mood and affect are normal  Ambulation remains normal  Cervical spine no mass or tenderness. Range of motion is normal. Muscle tone is normal. Skin is normal.  Right shoulder inspection of the shoulder reveals tenderness around the bursal tissue soft tissues of the deltoid. There is no crepitation her range of motion is limited but she has normal external rotation she has painful for elevation passively at 80 of abduction and 90 of flexion. Her stability tests are normal but we could not test her in abduction external rotation because of pain she had normal strength internal and external rotation we cannot test supraspinatus. We could not get her in position for impingement sign or Hawkins maneuver   Left shoulder  inspection reveals no tenderness or malalignment. There is no crepitation. The range of motion remains full flexion internal and external rotation. Stability tests are normal in abduction external rotation inferior subluxation test as well as the posterior stress test. Manual muscle testing of the supraspinatus, internal and external rotators 5 over 5  Impingement sign is normal, Hawkins maneuver normal, a.c. joint stress test normal.  X-rays are negative  Impression Encounter Diagnosis  Name Primary?  . Bursitis, shoulder, right Yes    I am recommending Subacromial injection Ibuprofen Norco Sling Rest   Shoulder Injection Procedure Note   Pre-operative Diagnosis: right  RC Syndrome  Post-operative Diagnosis: same  Indications: pain   Anesthesia: ethyl chloride   Procedure Details   Verbal consent was obtained for the procedure. The shoulder was prepped  withalcohol and the skin was anesthetized. A 20 gauge needle was advanced into the subacromial space through posterior approach without difficulty  The space was then injected with 3 ml 1% lidocaine and 1 ml of depomedrol. The injection site was cleansed with isopropyl alcohol and a  dressing was applied.  Complications:  None; patient tolerated the procedure well.

## 2013-03-11 ENCOUNTER — Encounter (HOSPITAL_COMMUNITY): Payer: Self-pay | Admitting: Emergency Medicine

## 2013-03-11 ENCOUNTER — Emergency Department (HOSPITAL_COMMUNITY)
Admission: EM | Admit: 2013-03-11 | Discharge: 2013-03-11 | Disposition: A | Discharge status: Home / Self Care | Payer: Medicare Other | Attending: Emergency Medicine | Admitting: Emergency Medicine

## 2013-03-11 DIAGNOSIS — M545 Low back pain, unspecified: Secondary | ICD-10-CM | POA: Insufficient documentation

## 2013-03-11 DIAGNOSIS — Z87891 Personal history of nicotine dependence: Secondary | ICD-10-CM | POA: Insufficient documentation

## 2013-03-11 DIAGNOSIS — Z79899 Other long term (current) drug therapy: Secondary | ICD-10-CM | POA: Insufficient documentation

## 2013-03-11 DIAGNOSIS — Z9889 Other specified postprocedural states: Secondary | ICD-10-CM | POA: Insufficient documentation

## 2013-03-11 DIAGNOSIS — G8929 Other chronic pain: Secondary | ICD-10-CM | POA: Insufficient documentation

## 2013-03-11 MED ORDER — DEXAMETHASONE 4 MG PO TABS: ORAL_TABLET | ORAL | Status: DC

## 2013-03-11 MED ORDER — CYCLOBENZAPRINE HCL 10 MG PO TABS: 10.0000 mg | ORAL_TABLET | Freq: Three times a day (TID) | ORAL | Status: DC | PRN

## 2013-03-11 MED ORDER — DEXAMETHASONE SODIUM PHOSPHATE 4 MG/ML IJ SOLN
10.0000 mg | Freq: Once | INTRAMUSCULAR | Status: AC
  Administered 2013-03-11: 10 mg via INTRAMUSCULAR
  Filled 2013-03-11: qty 3

## 2013-03-11 MED ORDER — DIAZEPAM 5 MG/ML IJ SOLN
10.0000 mg | Freq: Once | INTRAMUSCULAR | Status: AC
  Administered 2013-03-11: 10 mg via INTRAMUSCULAR
  Filled 2013-03-11: qty 2

## 2013-03-11 MED ORDER — FENTANYL CITRATE 0.05 MG/ML IJ SOLN
50.0000 ug | Freq: Once | INTRAMUSCULAR | Status: AC
  Administered 2013-03-11: 50 ug via INTRAMUSCULAR
  Filled 2013-03-11: qty 2

## 2013-03-11 NOTE — ED Notes (Signed)
Pt reports lower back pain x 5 days, states "it just went out". States the pain has gotten worse, and occasionally shoots down her right buttocks.

## 2013-03-11 NOTE — ED Provider Notes (Signed)
CSN: [id number]     Arrival date & time 03/11/13  0446 History   First MD Initiated Contact with Patient 03/11/13 0459     Chief Complaint  Patient presents with  . Back Pain   (Consider location/radiation/quality/duration/timing/severity/associated sxs/prior Treatment) HPI Patient reports "my back went out" 5 days ago. Patient states her husband has had 2 strokes and she does total care for him. He was in the hospital for a wound on his foot and he has been home for one week. She states she has to take care of him and take care of inside and outside of the house. In addition she also helps take care of her grandchildren. She does not report a specific injury but states "I've been doing too much". She states 5 days ago she started having pain in her lower back and it radiates into her right medial and posterior thigh and stops above her knee. She denies any numbness in her feet. She states the pain is sharp when it radiates into her leg. She states if she stands up or moves she feels like "there is an explosion" in her lower back and then she is able to move. She denies any nausea, vomiting, or fever. She reports she had micro- discectomy surgery in 2001 by Dr. Gerlene Fee. She states this feels similar. However she had pain in her right leg that time.   PCP none Orthopedist Dr Romeo Apple has an appt at 9:45 this morning for followup of a rotator cuff problem Neurosurgeon Dr Gerlene Fee  History reviewed. No pertinent past medical history. Past Surgical History  Procedure Laterality Date  . Back surgery     No family history on file. History  Substance Use Topics  . Smoking status: Former Games developer  . Smokeless tobacco: Not on file  . Alcohol Use: No   Lives at home Takes care of her husband   OB History   Grav Para Term Preterm Abortions TAB SAB Ect Mult Living                 Review of Systems  All other systems reviewed and are negative.    Allergies  Aspirin  Home Medications    No regular medications  Current Outpatient Rx  Name  Route  Sig  Dispense  Refill  . diazepam (VALIUM) 5 MG tablet   Oral   Take 1 tablet (5 mg total) by mouth every 8 (eight) hours as needed (muscle spasm).   12 tablet   0   . HYDROcodone-acetaminophen (NORCO/VICODIN) 5-325 MG per tablet   Oral   Take 1 tablet by mouth every 4 (four) hours as needed.   56 tablet   0   . ibuprofen (ADVIL,MOTRIN) 800 MG tablet   Oral   Take 1 tablet (800 mg total) by mouth every 8 (eight) hours as needed. For pain   90 tablet   5   . naproxen (NAPROSYN) 375 MG tablet   Oral   Take 1 tablet (375 mg total) by mouth 2 (two) times daily as needed (pain).   20 tablet   0    BP 135/92  Pulse 75  Temp(Src) 98.3 F (36.8 C) (Oral)  Resp 20  Ht 5' 6.5" (1.689 m)  Wt 165 lb (74.844 kg)  BMI 26.24 kg/m2  SpO2 95%  Vital signs normal   Physical Exam  Nursing note and vitals reviewed. Constitutional: She is oriented to person, place, and time. She appears well-developed and well-nourished.  Non-toxic  appearance. She does not appear ill. No distress.  HENT:  Head: Normocephalic and atraumatic.  Right Ear: External ear normal.  Left Ear: External ear normal.  Nose: Nose normal. No mucosal edema or rhinorrhea.  Mouth/Throat: Oropharynx is clear and moist and mucous membranes are normal. No dental abscesses or uvula swelling.  Eyes: Conjunctivae and EOM are normal. Pupils are equal, round, and reactive to light.  Neck: Normal range of motion and full passive range of motion without pain. Neck supple.  Cardiovascular: Normal rate, regular rhythm and normal heart sounds.  Exam reveals no gallop and no friction rub.   No murmur heard. Pulmonary/Chest: Effort normal and breath sounds normal. No respiratory distress. She has no wheezes. She has no rhonchi. She has no rales. She exhibits no tenderness and no crepitus.  Abdominal: Soft. Normal appearance and bowel sounds are normal. She exhibits no  distension. There is no tenderness. There is no rebound and no guarding.  Musculoskeletal: Normal range of motion. She exhibits no edema and no tenderness.       Back:  Patient is uncomfortable when changing positions. She has tenderness in her lower lumbar/sacral area and extending bilaterally over the paraspinous muscles. She has limited range of motion due to pain. Her reflexes at the patella are 2+ and equal. She has no pain with straight leg raising on the left, straight leg raising on the right causes pain in her thigh but not her back.  Neurological: She is alert and oriented to person, place, and time. She has normal strength. No cranial nerve deficit.  Skin: Skin is warm, dry and intact. No rash noted. No erythema. No pallor.  Psychiatric: She has a normal mood and affect. Her speech is normal and behavior is normal. Her mood appears not anxious.    ED Course  Procedures (including critical care time)  Medications  diazepam (VALIUM) injection 10 mg (10 mg Intramuscular Given 03/11/13 0532)  fentaNYL (SUBLIMAZE) injection 50 mcg (50 mcg Intramuscular Given 03/11/13 0531)  dexamethasone (DECADRON) injection 10 mg (10 mg Intramuscular Given 03/11/13 0532)    Pt still has hydrocodone (many) and naprosyn pills left in her bottles. Ibuprofen and valium are empty.  Pt trying to call her daughter to come pick her up from the ED, upset that she can't get the injections and drive home.  Pt also upset she didn't get an xray, states when she was here for her shoulder she got an xray. Patient advised most back problems are going to be muscular and most will improve over the next 6-8 weeks. So unless there was a trauma such as a fall, which she denies, xrays will not be helpful.  However if her pain persists then at that time further testing could be considered such as MRI.   Labs Review Labs Reviewed - No data to display Imaging Review No results found.   2001 FINDINGS  CLINICAL HISTORY:  LOW BACK PAIN; S/P MICRODISCECTOMY FOR LOW BACK/LT. LEG PAIN IN OCTOBER OF THIS  YEAR.  MRI SCAN OF THE LUMBOSACRAL SPINE PRE- AND POST-INFUSION:   IMPRESSION  1. L3-4 MILD DIFFUSE BULGE.  2. 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  3. L5-S1 SHALLOW CENTRAL FOCAL DISC PROTRUSION, ECCENTRICALLY PROMINENT RIGHT POSTEROLATERALLY,  WITH CONTACT WITH THE RIGHT-SIDED S-1 NERVE ROOT.  4. ENHANCING SCAR TISSUE IN THE LEFT ANTERIOR AND POSTERIOR EPIDURAL SPACE OF L5-S1, EXTENDING  INTO THE LEFT-SIDED NEURAL FORAMEN, INCLUDING THE L-5  AND S-1 NERVE ROOTS.  5. ASYMMETRICAL PROMINENCE OF THE LEFT-SIDED L-5 NERVE ROOT ASSOCIATED WITH MILD MASS EFFECT.  6. APPROXIMATELY 1 CM X 4 MM RING-ENHANCING ENTITY IN THE LEFT LAMINECTOMY DEFECT. THIS PROBABLY  REPRESENTS A POST-SURGICAL SEROMA.  7. ENHANCEMENT OF THE DISC AT L5-S1 AND OF THE END PLATE REGIONS IS PROBABLY POST-SURGICAL.  HOWEVER, AN EARLY INFLAMMATORY PROCESS MAY HAVE A SIMILAR NEUR0 IMAGING APPEARANCE. CLINICAL  CORRELATION WOULD BE HELPFUL.   EKG Interpretation   None       MDM   1. Acute low back pain     New Prescriptions   CYCLOBENZAPRINE (FLEXERIL) 10 MG TABLET    Take 1 tablet (10 mg total) by mouth 3 (three) times daily as needed for muscle spasms.   DEXAMETHASONE (DECADRON) 4 MG TABLET    Take 1 po BID with meals for 4 days then 1 po QD x 4 days    Plan discharge  Devoria Albe, MD, Franz Dell, MD 03/11/13 [phone]

## 2013-03-16 ENCOUNTER — Ambulatory Visit: Payer: Medicare Other | Admitting: Orthopedic Surgery

## 2013-03-16 ENCOUNTER — Encounter: Payer: Self-pay | Admitting: Orthopedic Surgery

## 2013-03-23 ENCOUNTER — Ambulatory Visit (INDEPENDENT_AMBULATORY_CARE_PROVIDER_SITE_OTHER): Payer: Medicare Other | Admitting: Orthopedic Surgery

## 2013-03-23 VITALS — BP 154/106 | Ht 66.0 in | Wt 162.0 lb

## 2013-03-23 DIAGNOSIS — M549 Dorsalgia, unspecified: Secondary | ICD-10-CM

## 2013-03-23 MED ORDER — GABAPENTIN 100 MG PO CAPS: 100.0000 mg | ORAL_CAPSULE | Freq: Every day | ORAL | Status: DC

## 2013-03-23 NOTE — Patient Instructions (Signed)
Continue ibuprofen   Call [facility] reference back and leg pain   Start gabapentin 100 mg at night

## 2013-03-23 NOTE — Progress Notes (Signed)
Patient ID: Kathryn Woods, female   DOB: [birth year], 62 y.o.   MRN: [id number]  Chief Complaint  Patient presents with  . Follow-up    recheck right shoulder bursitis    HISTORY:  62 year old female recently treated for right shoulder bursitis with injection. She is recovered nicely.  However she has a new complaint today of right-sided back pain with radiation into her right thigh status post lumbar disc surgery Dr. Gerlene Fee back in 2001 for left-sided symptoms. She is no acute trauma she's been taking care of her husband may have brought it on. She had to go to the emergency room for inability to walk. She feels that the leg will give out. She was placed on dexamethasone and cyclobenzaprine along with ibuprofen. She became too hyper on the dexamethasone and stopped it and the cyclobenzaprine and is now just on ibuprofen  She denies any bowel or bladder symptoms at this time she's not having any numbness or tingling below the knee her symptoms are in the right side of her back buttock and posterior right thigh  BP 154/106  Ht 5\' 6"  (1.676 m)  Wt 162 lb (73.483 kg)  BMI 26.16 kg/m2 General appearance is normal, the patient is alert and oriented x3 with normal mood and affect. Her ambulatory pattern seems to be normal.  She has some tenderness in the lower back and the right side of her buttocks with normal range of motion and stability in the right hip motor exam is normal skin is intact pulses are good no sensory deficit straight leg raises are reactive for pain in the right leg  Recommend followup with Dr. Gerlene Fee as this is a lumbar disc related problem  Her shoulder has improved  I did start her on 100 mg of gabapentin

## 2013-04-12 ENCOUNTER — Other Ambulatory Visit (HOSPITAL_COMMUNITY): Payer: Self-pay | Admitting: Neurosurgery

## 2013-04-12 DIAGNOSIS — M549 Dorsalgia, unspecified: Secondary | ICD-10-CM

## 2013-04-15 ENCOUNTER — Ambulatory Visit: Payer: Medicare Other | Admitting: Orthopedic Surgery

## 2013-04-16 ENCOUNTER — Encounter (HOSPITAL_COMMUNITY): Payer: Self-pay

## 2013-04-16 ENCOUNTER — Ambulatory Visit (HOSPITAL_COMMUNITY)
Admission: RE | Admit: 2013-04-16 | Discharge: 2013-04-16 | Disposition: A | Discharge status: Home / Self Care | Payer: Medicare Other | Source: Ambulatory Visit | Attending: Neurosurgery | Admitting: Neurosurgery

## 2013-04-16 DIAGNOSIS — M545 Low back pain, unspecified: Secondary | ICD-10-CM | POA: Insufficient documentation

## 2013-04-16 DIAGNOSIS — M539 Dorsopathy, unspecified: Secondary | ICD-10-CM | POA: Insufficient documentation

## 2013-04-16 DIAGNOSIS — M5126 Other intervertebral disc displacement, lumbar region: Secondary | ICD-10-CM | POA: Insufficient documentation

## 2013-04-16 DIAGNOSIS — M51379 Other intervertebral disc degeneration, lumbosacral region without mention of lumbar back pain or lower extremity pain: Secondary | ICD-10-CM | POA: Insufficient documentation

## 2013-04-16 DIAGNOSIS — M48061 Spinal stenosis, lumbar region without neurogenic claudication: Secondary | ICD-10-CM | POA: Insufficient documentation

## 2013-04-16 DIAGNOSIS — M5137 Other intervertebral disc degeneration, lumbosacral region: Secondary | ICD-10-CM | POA: Insufficient documentation

## 2013-04-16 DIAGNOSIS — M549 Dorsalgia, unspecified: Secondary | ICD-10-CM

## 2013-04-16 LAB — POCT I-STAT, CHEM 8
BUN: 12 mg/dL (ref 6–23)
Calcium, Ion: 1.24 mmol/L (ref 1.13–1.30)
Chloride: 104 mEq/L (ref 96–112)
Creatinine, Ser: 0.9 mg/dL (ref 0.50–1.10)
Glucose, Bld: 89 mg/dL (ref 70–99)
HCT: 41 % (ref 36.0–46.0)
Hemoglobin: 13.9 g/dL (ref 12.0–15.0)
Potassium: 3.7 mEq/L (ref 3.7–5.3)
Sodium: 143 mEq/L (ref 137–147)
TCO2: 27 mmol/L (ref 0–100)

## 2013-04-16 MED ORDER — GADOBENATE DIMEGLUMINE 529 MG/ML IV SOLN
15.0000 mL | Freq: Once | INTRAVENOUS | Status: AC | PRN
  Administered 2013-04-16: 15 mL via INTRAVENOUS

## 2013-04-19 ENCOUNTER — Emergency Department (HOSPITAL_COMMUNITY)
Admission: EM | Admit: 2013-04-19 | Discharge: 2013-04-19 | Disposition: A | Discharge status: Home / Self Care | Payer: Medicare Other | Attending: Emergency Medicine | Admitting: Emergency Medicine

## 2013-04-19 ENCOUNTER — Encounter (HOSPITAL_COMMUNITY): Payer: Self-pay | Admitting: Emergency Medicine

## 2013-04-19 DIAGNOSIS — L02519 Cutaneous abscess of unspecified hand: Secondary | ICD-10-CM | POA: Insufficient documentation

## 2013-04-19 DIAGNOSIS — I891 Lymphangitis: Secondary | ICD-10-CM

## 2013-04-19 DIAGNOSIS — Z87891 Personal history of nicotine dependence: Secondary | ICD-10-CM | POA: Insufficient documentation

## 2013-04-19 DIAGNOSIS — L03019 Cellulitis of unspecified finger: Principal | ICD-10-CM | POA: Insufficient documentation

## 2013-04-19 MED ORDER — CEPHALEXIN 500 MG PO CAPS: 500.0000 mg | ORAL_CAPSULE | Freq: Four times a day (QID) | ORAL | Status: DC

## 2013-04-19 MED ORDER — CEPHALEXIN 500 MG PO CAPS
1000.0000 mg | ORAL_CAPSULE | Freq: Once | ORAL | Status: AC
  Administered 2013-04-19: 1000 mg via ORAL
  Filled 2013-04-19: qty 2

## 2013-04-19 MED ORDER — LIDOCAINE HCL (PF) 2 % IJ SOLN
INTRAMUSCULAR | Status: AC
  Filled 2013-04-19: qty 10

## 2013-04-19 MED ORDER — OXYCODONE-ACETAMINOPHEN 5-325 MG PO TABS: 1.0000 | ORAL_TABLET | ORAL | Status: DC | PRN

## 2013-04-19 NOTE — ED Provider Notes (Signed)
CSN: [id number]     Arrival date & time 04/19/13  1733 History  This chart was scribed for Flint Melter, MD by Dorothey Baseman, ED Scribe. This patient was seen in room APA01/APA01 and the patient's care was started at 9:56 PM.    Chief Complaint  Patient presents with  . finger infection    The history is provided by the patient. No language interpreter was used.   HPI Comments: Kathryn Woods is a 63 y.o. female who presents to the Emergency Department complaining of a possible infection to the left pinky finger. She reports noticing a small bump to the area about a week ago that has been progressively increasing in size with associated red streaking up the left forearm. She reports applying peroxide and attempting to squeeze the area at home without relief. She denies any potential injury or trauma to the area. She denies history of DM, HTN, or any other pertinent medical history.   History reviewed. No pertinent past medical history. Past Surgical History  Procedure Laterality Date  . Back surgery     No family history on file. History  Substance Use Topics  . Smoking status: Former Games developer  . Smokeless tobacco: Not on file  . Alcohol Use: No   OB History   Grav Para Term Preterm Abortions TAB SAB Ect Mult Living                 Review of Systems  Skin: Positive for color change and wound.  All other systems reviewed and are negative.    Allergies  Aspirin  Home Medications   Current Outpatient Rx  Name  Route  Sig  Dispense  Refill  . cephALEXin (KEFLEX) 500 MG capsule   Oral   Take 1 capsule (500 mg total) by mouth 4 (four) times daily.   28 capsule   0   . cyclobenzaprine (FLEXERIL) 10 MG tablet   Oral   Take 10 mg by mouth 3 (three) times daily as needed for muscle spasms.         . diazepam (VALIUM) 5 MG tablet   Oral   Take 5 mg by mouth every 8 (eight) hours as needed for anxiety.         . gabapentin (NEURONTIN) 100 MG capsule   Oral   Take  100 mg by mouth at bedtime.         Marland Kitchen HYDROcodone-acetaminophen (NORCO/VICODIN) 5-325 MG per tablet   Oral   Take 1 tablet by mouth every 4 (four) hours as needed for moderate pain.         Marland Kitchen ibuprofen (ADVIL,MOTRIN) 800 MG tablet   Oral   Take 800 mg by mouth every 8 (eight) hours as needed.         . naproxen (NAPROSYN) 375 MG tablet   Oral   Take 375 mg by mouth 2 (two) times daily as needed for mild pain or moderate pain.         Marland Kitchen oxyCODONE-acetaminophen (PERCOCET/ROXICET) 5-325 MG per tablet   Oral   Take 1 tablet by mouth every 4 (four) hours as needed for severe pain.   6 tablet   0    Triage Vitals: BP 135/91  Pulse 90  Temp(Src) 98.3 F (36.8 C) (Oral)  Resp 20  Ht 5' 6.5" (1.689 m)  Wt 162 lb (73.483 kg)  BMI 25.76 kg/m2  SpO2 97%  Physical Exam  Nursing note and vitals reviewed. Constitutional: She is  oriented to person, place, and time. She appears well-developed and well-nourished.  HENT:  Head: Normocephalic and atraumatic.  Eyes: Conjunctivae and EOM are normal. Pupils are equal, round, and reactive to light.  Neck: Normal range of motion and phonation normal. Neck supple.  Cardiovascular: Normal rate, regular rhythm and intact distal pulses.   Pulmonary/Chest: Effort normal. She exhibits no tenderness.  Abdominal: Soft. She exhibits no distension. There is no tenderness. There is no guarding.  Musculoskeletal: Normal range of motion.  Neurological: She is alert and oriented to person, place, and time. She exhibits normal muscle tone.  Skin: Skin is warm and dry. There is erythema.  Red swollen area of the dorsal proximal left 5th finger with a red streak eminating from the point up the ulnar aspect of the forearm to the elbow. There are no epitrochlear nodes.   Psychiatric: She has a normal mood and affect. Her behavior is normal. Judgment and thought content normal.    ED Course  Procedures (including critical care time)  DIAGNOSTIC  STUDIES: Oxygen Saturation is 97% on room air, normal by my interpretation.    COORDINATION OF CARE: 9:59 PM- Will perform an incision and drainage of the area. Discussed treatment plan with patient at bedside and patient verbalized agreement.   10:13 PM- Performed incision and drainage. Advised patient to soak the area in warm water. Will discharge patient with antibiotics and pain medication to manage symptoms. Return precautions given. Discussed treatment plan with patient at bedside and patient verbalized agreement.   INCISION AND DRAINAGE Performed by: Gray BernhardtElliot Breckon Reeves, MD Consent: Verbal consent obtained. Risks and benefits: risks, benefits and alternatives were discussed Type: abscess Body area: Left 5th finger Anesthesia: local infiltration Incision was made with a scalpel. Local anesthetic: lidocaine 2% without epinephrine Anesthetic total: 2.5 ml Complexity: simple Blunt dissection to break up loculations Drainage: bloody, purulent Drainage amount: mild Packing material: 1/4 in iodoform gauze Paient tolerance: Patient tolerated the procedure well with no immediate complications.  EKG Interpretation   None       MDM   1. Abscess of finger   2. Lymphangitis    Finger abscess, treated with I&D. Lymphangitis, without evidence for joint sepsis, or systemic involvement.  Nursing Notes Reviewed/ Care Coordinated Applicable Imaging Reviewed Interpretation of Laboratory Data incorporated into ED treatment  The patient appears reasonably screened and/or stabilized for discharge and I doubt any other medical condition or other [facility] requiring further screening, evaluation, or treatment in the ED at this time prior to discharge.  Plan: Home Medications- Keflex, Percocet; Home Treatments- warm soaks; return here if the recommended treatment, does not improve the symptoms; Recommended follow up- Return here prn   I personally performed the services described in this  documentation, which was scribed in my presence. The recorded information has been reviewed and is accurate.       Flint MelterElliott L Geremy Rister, MD 04/20/13 [phone]

## 2013-04-19 NOTE — ED Notes (Signed)
Pt states she started with a small bump to pinky finger of left hand a week ago, has not gotten worse, appears infected, streaks up forearm.

## 2013-04-19 NOTE — Discharge Instructions (Signed)
°  Soak in warm water 3-5 times a day for 30 minutes, until the wound and redness are improved. Return here, or see the doctor of your choice, as needed for problems.  Abscess An abscess is an infected area that contains a collection of pus and debris.It can occur in almost any part of the body. An abscess is also known as a furuncle or boil. CAUSES  An abscess occurs when tissue gets infected. This can occur from blockage of oil or sweat glands, infection of hair follicles, or a minor injury to the skin. As the body tries to fight the infection, pus collects in the area and creates pressure under the skin. This pressure causes pain. People with weakened immune systems have difficulty fighting infections and get certain abscesses more often.  SYMPTOMS Usually an abscess develops on the skin and becomes a painful mass that is red, warm, and tender. If the abscess forms under the skin, you may feel a moveable soft area under the skin. Some abscesses break open (rupture) on their own, but most will continue to get worse without care. The infection can spread deeper into the body and eventually into the bloodstream, causing you to feel ill.  DIAGNOSIS  Your caregiver will take your medical history and perform a physical exam. A sample of fluid may also be taken from the abscess to determine what is causing your infection. TREATMENT  Your caregiver may prescribe antibiotic medicines to fight the infection. However, taking antibiotics alone usually does not cure an abscess. Your caregiver may need to make a small cut (incision) in the abscess to drain the pus. In some cases, gauze is packed into the abscess to reduce pain and to continue draining the area. HOME CARE INSTRUCTIONS   Only take over-the-counter or prescription medicines for pain, discomfort, or fever as directed by your caregiver.  If you were prescribed antibiotics, take them as directed. Finish them even if you start to feel better.  If  gauze is used, follow your caregiver's directions for changing the gauze.  To avoid spreading the infection:  Keep your draining abscess covered with a bandage.  Wash your hands well.  Do not share personal care items, towels, or whirlpools with others.  Avoid skin contact with others.  Keep your skin and clothes clean around the abscess.  Keep all follow-up appointments as directed by your caregiver. SEEK MEDICAL CARE IF:   You have increased pain, swelling, redness, fluid drainage, or bleeding.  You have muscle aches, chills, or a general ill feeling.  You have a fever. MAKE SURE YOU:   Understand these instructions.  Will watch your condition.  Will get help right away if you are not doing well or get worse. Document Released: 01/02/2005 Document Revised: 09/24/2011 Document Reviewed: 06/07/2011 [facility] Patient Information 2014 [place], [place].

## 2013-04-19 NOTE — ED Notes (Signed)
Finger dressed.  Discharge instructions given and reviewed with patient.  Prescription given for Keflex; effects and use explained.  Patient verbalized understanding to take all antibiotic.  Percocet pre-pack given.  Patient ambulatory; discharged home in good condition.

## 2013-05-05 MED FILL — Oxycodone w/ Acetaminophen Tab 5-325 MG: ORAL | Qty: 6 | Status: AC

## 2013-05-18 ENCOUNTER — Other Ambulatory Visit: Payer: Self-pay | Admitting: Neurosurgery

## 2013-05-18 DIAGNOSIS — M549 Dorsalgia, unspecified: Secondary | ICD-10-CM

## 2013-05-24 ENCOUNTER — Other Ambulatory Visit: Payer: Medicare Other

## 2013-06-02 ENCOUNTER — Other Ambulatory Visit: Payer: Medicare Other

## 2013-06-09 ENCOUNTER — Ambulatory Visit
Admission: RE | Admit: 2013-06-09 | Discharge: 2013-06-09 | Disposition: A | Discharge status: Home / Self Care | Payer: Medicare Other | Source: Ambulatory Visit | Attending: Neurosurgery | Admitting: Neurosurgery

## 2013-06-09 DIAGNOSIS — M549 Dorsalgia, unspecified: Secondary | ICD-10-CM

## 2013-06-09 MED ORDER — METHYLPREDNISOLONE ACETATE 40 MG/ML INJ SUSP (RADIOLOG
120.0000 mg | Freq: Once | INTRAMUSCULAR | Status: AC
  Administered 2013-06-09: 120 mg via EPIDURAL

## 2013-06-09 MED ORDER — IOHEXOL 180 MG/ML  SOLN
1.0000 mL | Freq: Once | INTRAMUSCULAR | Status: AC | PRN
  Administered 2013-06-09: 1 mL via EPIDURAL

## 2013-06-09 NOTE — Discharge Instructions (Signed)

## 2014-10-03 ENCOUNTER — Encounter (HOSPITAL_COMMUNITY): Payer: Self-pay | Admitting: *Deleted

## 2014-10-03 ENCOUNTER — Emergency Department (HOSPITAL_COMMUNITY)
Admission: EM | Admit: 2014-10-03 | Discharge: 2014-10-03 | Disposition: A | Discharge status: Home / Self Care | Payer: Medicare Other | Attending: Emergency Medicine | Admitting: Emergency Medicine

## 2014-10-03 DIAGNOSIS — Z87891 Personal history of nicotine dependence: Secondary | ICD-10-CM | POA: Insufficient documentation

## 2014-10-03 DIAGNOSIS — Z79899 Other long term (current) drug therapy: Secondary | ICD-10-CM | POA: Insufficient documentation

## 2014-10-03 DIAGNOSIS — R51 Headache: Secondary | ICD-10-CM | POA: Diagnosis present

## 2014-10-03 DIAGNOSIS — R519 Headache, unspecified: Secondary | ICD-10-CM

## 2014-10-03 DIAGNOSIS — H1132 Conjunctival hemorrhage, left eye: Secondary | ICD-10-CM | POA: Insufficient documentation

## 2014-10-03 LAB — CBC WITH DIFFERENTIAL/PLATELET
Basophils Absolute: 0 10*3/uL (ref 0.0–0.1)
Basophils Relative: 1 % (ref 0–1)
Eosinophils Absolute: 0.4 10*3/uL (ref 0.0–0.7)
Eosinophils Relative: 5 % (ref 0–5)
HCT: 38.2 % (ref 36.0–46.0)
Hemoglobin: 12.6 g/dL (ref 12.0–15.0)
Lymphocytes Relative: 31 % (ref 12–46)
Lymphs Abs: 2.7 10*3/uL (ref 0.7–4.0)
MCH: 30 pg (ref 26.0–34.0)
MCHC: 33 g/dL (ref 30.0–36.0)
MCV: 91 fL (ref 78.0–100.0)
Monocytes Absolute: 0.6 10*3/uL (ref 0.1–1.0)
Monocytes Relative: 7 % (ref 3–12)
Neutro Abs: 4.8 10*3/uL (ref 1.7–7.7)
Neutrophils Relative %: 56 % (ref 43–77)
Platelets: 275 10*3/uL (ref 150–400)
RBC: 4.2 MIL/uL (ref 3.87–5.11)
RDW: 13.8 % (ref 11.5–15.5)
WBC: 8.6 10*3/uL (ref 4.0–10.5)

## 2014-10-03 LAB — BASIC METABOLIC PANEL
Anion gap: 6 (ref 5–15)
BUN: 12 mg/dL (ref 6–20)
CO2: 24 mmol/L (ref 22–32)
Calcium: 8.9 mg/dL (ref 8.9–10.3)
Chloride: 108 mmol/L (ref 101–111)
Creatinine, Ser: 0.82 mg/dL (ref 0.44–1.00)
GFR calc Af Amer: >60 mL/min (ref >60)
GFR calc non Af Amer: >60 mL/min (ref >60)
Glucose, Bld: 108 mg/dL — ABNORMAL HIGH (ref 65–99)
Potassium: 3.4 mmol/L — ABNORMAL LOW (ref 3.5–5.1)
Sodium: 138 mmol/L (ref 135–145)

## 2014-10-03 LAB — SEDIMENTATION RATE: Sed Rate: 10 mm/hr (ref 0–22)

## 2014-10-03 MED ORDER — METOCLOPRAMIDE HCL 10 MG PO TABS: 10.0000 mg | ORAL_TABLET | Freq: Four times a day (QID) | ORAL | Status: DC | PRN

## 2014-10-03 MED ORDER — METOCLOPRAMIDE HCL 5 MG/ML IJ SOLN
10.0000 mg | Freq: Once | INTRAMUSCULAR | Status: AC
  Administered 2014-10-03: 10 mg via INTRAVENOUS
  Filled 2014-10-03: qty 2

## 2014-10-03 MED ORDER — SODIUM CHLORIDE 0.9 % IV SOLN
1000.0000 mL | INTRAVENOUS | Status: DC
Start: 2014-10-03 — End: 2014-10-03
  Administered 2014-10-03: 1000 mL via INTRAVENOUS

## 2014-10-03 MED ORDER — SODIUM CHLORIDE 0.9 % IV SOLN
1000.0000 mL | Freq: Once | INTRAVENOUS | Status: AC
  Administered 2014-10-03: 1000 mL via INTRAVENOUS

## 2014-10-03 MED ORDER — DIPHENHYDRAMINE HCL 50 MG/ML IJ SOLN
25.0000 mg | Freq: Once | INTRAMUSCULAR | Status: AC
  Administered 2014-10-03: 25 mg via INTRAVENOUS
  Filled 2014-10-03: qty 1

## 2014-10-03 NOTE — ED Provider Notes (Signed)
CSN: [id number]     Arrival date & time 10/03/14  0027 History   First MD Initiated Contact with Patient 10/03/14 0106     Chief Complaint  Patient presents with  . Headache     (Consider location/radiation/quality/duration/timing/severity/associated sxs/prior Treatment) Patient is a 64 y.o. female presenting with headaches. The history is provided by the patient.  Headache She complains of a left retro-orbital headache for the last 3 days. Pain is throbbing and she rates it at 7/10. There is no associated visual change or nausea. Pain is slightly worse with exposure to bright light but no phonophobia. She took ibuprofen with no relief. 2 days ago, she noticed redness in her left eye. There is no pain or irritation other than the headache. She denies any trauma. She states that she checked her blood pressure today and it was elevated but cannot, the exact number. She is not under medical care, but states that she does check her blood pressure periodically and it is usually normal. She gets occasional sinus headaches but, otherwise, usually does not get headaches.  History reviewed. No pertinent past medical history. Past Surgical History  Procedure Laterality Date  . Back surgery     No family history on file. History  Substance Use Topics  . Smoking status: Former Games developer  . Smokeless tobacco: Not on file  . Alcohol Use: No   OB History    No data available     Review of Systems  Neurological: Positive for headaches.  All other systems reviewed and are negative.     Allergies  Aspirin  Home Medications   Prior to Admission medications   Medication Sig Start Date End Date Taking? Authorizing Provider  cephALEXin (KEFLEX) 500 MG capsule Take 1 capsule (500 mg total) by mouth 4 (four) times daily. 04/19/13   Mancel Bale, MD  cyclobenzaprine (FLEXERIL) 10 MG tablet Take 10 mg by mouth 3 (three) times daily as needed for muscle spasms.    Historical Provider, MD  diazepam  (VALIUM) 5 MG tablet Take 5 mg by mouth every 8 (eight) hours as needed for anxiety.    Historical Provider, MD  gabapentin (NEURONTIN) 100 MG capsule Take 100 mg by mouth at bedtime.    Historical Provider, MD  HYDROcodone-acetaminophen (NORCO/VICODIN) 5-325 MG per tablet Take 1 tablet by mouth every 4 (four) hours as needed for moderate pain.    Historical Provider, MD  ibuprofen (ADVIL,MOTRIN) 800 MG tablet Take 800 mg by mouth every 8 (eight) hours as needed.    Historical Provider, MD  naproxen (NAPROSYN) 375 MG tablet Take 375 mg by mouth 2 (two) times daily as needed for mild pain or moderate pain.    Historical Provider, MD  oxyCODONE-acetaminophen (PERCOCET/ROXICET) 5-325 MG per tablet Take 1 tablet by mouth every 4 (four) hours as needed for severe pain. 04/19/13   Mancel Bale, MD   BP 154/101 mmHg  Pulse 68  Temp(Src) 97.9 F (36.6 C) (Oral)  Resp 20  Ht 5\' 6"  (1.676 m)  Wt 159 lb (72.122 kg)  BMI 25.68 kg/m2  SpO2 100% Physical Exam  Nursing note and vitals reviewed.  64 year old female, resting comfortably and in no acute distress. Vital signs are significant for hypertension. Oxygen saturation is 100%, which is normal. Head is normocephalic and atraumatic. PERRLA, EOMI. Oropharynx is clear. Fundi show no hemorrhage, exudate, or papilledema. There is a subconjunctival hemorrhage on the lateral aspect of the left eye. There is tenderness palpation over the  left temporalis muscle. Temporal artery is not palpable. Neck is nontender and supple without adenopathy or JVD. Back is nontender and there is no CVA tenderness. Lungs are clear without rales, wheezes, or rhonchi. Chest is nontender. Heart has regular rate and rhythm without murmur. Abdomen is soft, flat, nontender without masses or hepatosplenomegaly and peristalsis is normoactive. Extremities have no cyanosis or edema, full range of motion is present. Skin is warm and dry without rash. Neurologic: Mental status is  normal, cranial nerves are intact, there are no motor or sensory deficits.  ED Course  Procedures (including critical care time) Labs Review Results for orders placed or performed during the hospital encounter of 10/03/14  Basic metabolic panel  Result Value Ref Range   Sodium 138 135 - 145 mmol/L   Potassium 3.4 (L) 3.5 - 5.1 mmol/L   Chloride 108 101 - 111 mmol/L   CO2 24 22 - 32 mmol/L   Glucose, Bld 108 (H) 65 - 99 mg/dL   BUN 12 6 - 20 mg/dL   Creatinine, Ser 4.09 0.44 - 1.00 mg/dL   Calcium 8.9 8.9 - 81.1 mg/dL   GFR calc non Af Amer >60 >60 mL/min   GFR calc Af Amer >60 >60 mL/min   Anion gap 6 5 - 15  CBC with Differential  Result Value Ref Range   WBC 8.6 4.0 - 10.5 K/uL   RBC 4.20 3.87 - 5.11 MIL/uL   Hemoglobin 12.6 12.0 - 15.0 g/dL   HCT 91.4 78.2 - 95.6 %   MCV 91.0 78.0 - 100.0 fL   MCH 30.0 26.0 - 34.0 pg   MCHC 33.0 30.0 - 36.0 g/dL   RDW 21.3 08.6 - 57.8 %   Platelets 275 150 - 400 K/uL   Neutrophils Relative % 56 43 - 77 %   Neutro Abs 4.8 1.7 - 7.7 K/uL   Lymphocytes Relative 31 12 - 46 %   Lymphs Abs 2.7 0.7 - 4.0 K/uL   Monocytes Relative 7 3 - 12 %   Monocytes Absolute 0.6 0.1 - 1.0 K/uL   Eosinophils Relative 5 0 - 5 %   Eosinophils Absolute 0.4 0.0 - 0.7 K/uL   Basophils Relative 1 0 - 1 %   Basophils Absolute 0.0 0.0 - 0.1 K/uL  Sedimentation rate  Result Value Ref Range   Sed Rate 10 0 - 22 mm/hr    MDM   Final diagnoses:  Headache, unspecified headache type  Subconjunctival hemorrhage of left eye    Left retro-orbital headache which may be muscle contraction headache. Subconjunctival hemorrhage. I do not see any red flags to indicate severe causes of headaches and she will be treated with a headache cocktail. Sedimentation rate will be obtained to screen for temporal arteritis. Old records are reviewed and I did not see any prior ED visits for headaches.  She feels much better after above noted treatment. Laboratory workup is  unremarkable including normal sedimentation rate. She is discharged with prescription for metoclopramide.  Dione Booze, MD 10/03/14 [phone]

## 2014-10-03 NOTE — ED Notes (Signed)
Patient verbalizes understanding of discharge instructions, prescription medications, home care and follow up care. Patient ambulatory out of department at this time. 

## 2014-10-03 NOTE — Discharge Instructions (Signed)
General Headache Without Cause A headache is pain or discomfort felt around the head or neck area. The specific cause of a headache may not be found. There are many causes and types of headaches. A few common ones are:  Tension headaches.  Migraine headaches.  Cluster headaches.  Chronic daily headaches. HOME CARE INSTRUCTIONS   Keep all follow-up appointments with your caregiver or any specialist referral.  Only take over-the-counter or prescription medicines for pain or discomfort as directed by your caregiver.  Lie down in a dark, quiet room when you have a headache.  Keep a headache journal to find out what may trigger your migraine headaches. For example, write down:  What you eat and drink.  How much sleep you get.  Any change to your diet or medicines.  Try massage or other relaxation techniques.  Put ice packs or heat on the head and neck. Use these 3 to 4 times per day for 15 to 20 minutes each time, or as needed.  Limit stress.  Sit up straight, and do not tense your muscles.  Quit smoking if you smoke.  Limit alcohol use.  Decrease the amount of caffeine you drink, or stop drinking caffeine.  Eat and sleep on a regular schedule.  Get 7 to 9 hours of sleep, or as recommended by your caregiver.  Keep lights dim if bright lights bother you and make your headaches worse. SEEK MEDICAL CARE IF:   You have problems with the medicines you were prescribed.  Your medicines are not working.  You have a change from the usual headache.  You have nausea or vomiting. SEEK IMMEDIATE MEDICAL CARE IF:   Your headache becomes severe.  You have a fever.  You have a stiff neck.  You have loss of vision.  You have muscular weakness or loss of muscle control.  You start losing your balance or have trouble walking.  You feel faint or pass out.  You have severe symptoms that are different from your first symptoms. MAKE SURE YOU:   Understand these  instructions.  Will watch your condition.  Will get help right away if you are not doing well or get worse. Document Released: 03/25/2005 Document Revised: 06/17/2011 Document Reviewed: 04/10/2011 [facility] Patient Information 2015 [place], [place]. This information is not intended to replace advice given to you by your health care provider. Make sure you discuss any questions you have with your health care provider.  Metoclopramide tablets What is this medicine? METOCLOPRAMIDE (met oh kloe PRA mide) is used to treat the symptoms of gastroesophageal reflux disease (GERD) like heartburn. It is also used to treat people with slow emptying of the stomach and intestinal tract. This medicine may be used for other purposes; ask your health care provider or pharmacist if you have questions. COMMON BRAND NAME(S): Reglan What should I tell my health care provider before I take this medicine? They need to know if you have any of these conditions: -breast cancer -depression -diabetes -heart failure -high blood pressure -kidney disease -liver disease -Parkinson's disease or a movement disorder -pheochromocytoma -seizures -stomach obstruction, bleeding, or perforation -an unusual or allergic reaction to metoclopramide, procainamide, sulfites, other medicines, foods, dyes, or preservatives -pregnant or trying to get pregnant -breast-feeding How should I use this medicine? Take this medicine by mouth with a glass of water. Follow the directions on the prescription label. Take this medicine on an empty stomach, about 30 minutes before eating. Take your doses at regular intervals. Do not take  your medicine more often than directed. Do not stop taking except on the advice of your doctor or health care professional. A special MedGuide will be given to you by the pharmacist with each prescription and refill. Be sure to read this information carefully each time. Talk to your pediatrician regarding the use  of this medicine in children. Special care may be needed. Overdosage: If you think you have taken too much of this medicine contact a poison control center or emergency room at once. NOTE: This medicine is only for you. Do not share this medicine with others. What if I miss a dose? If you miss a dose, take it as soon as you can. If it is almost time for your next dose, take only that dose. Do not take double or extra doses. What may interact with this medicine? -acetaminophen -cyclosporine -digoxin -medicines for blood pressure -medicines for diabetes, including insulin -medicines for hay fever and other allergies -medicines for depression, especially an Monoamine Oxidase Inhibitor (MAOI) -medicines for Parkinson's disease, like levodopa -medicines for sleep or for pain -tetracycline This list may not describe all possible interactions. Give your health care provider a list of all the medicines, herbs, non-prescription drugs, or dietary supplements you use. Also tell them if you smoke, drink alcohol, or use illegal drugs. Some items may interact with your medicine. What should I watch for while using this medicine? It may take a few weeks for your stomach condition to start to get better. However, do not take this medicine for longer than 12 weeks. The longer you take this medicine, and the more you take it, the greater your chances are of developing serious side effects. If you are an elderly patient, a female patient, or you have diabetes, you may be at an increased risk for side effects from this medicine. Contact your doctor immediately if you start having movements you cannot control such as lip smacking, rapid movements of the tongue, involuntary or uncontrollable movements of the eyes, head, arms and legs, or muscle twitches and spasms. Patients and their families should watch out for worsening depression or thoughts of suicide. Also watch out for any sudden or severe changes in feelings  such as feeling anxious, agitated, panicky, irritable, hostile, aggressive, impulsive, severely restless, overly excited and hyperactive, or not being able to sleep. If this happens, especially at the beginning of treatment or after a change in dose, call your doctor. Do not treat yourself for high fever. Ask your doctor or health care professional for advice. You may get drowsy or dizzy. Do not drive, use machinery, or do anything that needs mental alertness until you know how this drug affects you. Do not stand or sit up quickly, especially if you are an older patient. This reduces the risk of dizzy or fainting spells. Alcohol can make you more drowsy and dizzy. Avoid alcoholic drinks. What side effects may I notice from receiving this medicine? Side effects that you should report to your doctor or health care professional as soon as possible: -allergic reactions like skin rash, itching or hives, swelling of the face, lips, or tongue -abnormal production of milk in females -breast enlargement in both males and females -change in the way you walk -difficulty moving, speaking or swallowing -drooling, lip smacking, or rapid movements of the tongue -excessive sweating -fever -involuntary or uncontrollable movements of the eyes, head, arms and legs -irregular heartbeat or palpitations -muscle twitches and spasms -unusually weak or tired Side effects that usually do not  require medical attention (report to your doctor or health care professional if they continue or are bothersome): -change in sex drive or performance -depressed mood -diarrhea -difficulty sleeping -headache -menstrual changes -restless or nervous This list may not describe all possible side effects. Call your doctor for medical advice about side effects. You may report side effects to FDA at 1-800-FDA-1088. Where should I keep my medicine? Keep out of the reach of children. Store at room temperature between 20 and 25 degrees C  (68 and 77 degrees F). Protect from light. Keep container tightly closed. Throw away any unused medicine after the expiration date. NOTE: This sheet is a summary. It may not cover all possible information. If you have questions about this medicine, talk to your doctor, pharmacist, or health care provider.  2015, [employer] Standard. (2011-07-23 13:04:38)  Subconjunctival Hemorrhage A subconjunctival hemorrhage is a bright red patch covering a portion of the white of the eye. The white part of the eye is called the sclera, and it is covered by a thin membrane called the conjunctiva. This membrane is clear, except for tiny blood vessels that you can see with the naked eye. When your eye is irritated or inflamed and becomes red, it is because the vessels in the conjunctiva are swollen. Sometimes, a blood vessel in the conjunctiva can break and bleed. When this occurs, the blood builds up between the conjunctiva and the sclera, and spreads out to create a red area. The red spot may be very small at first. It may then spread to cover a larger part of the surface of the eye, or even all of the visible white part of the eye. In almost all cases, the blood will go away and the eye will become white again. Before completely dissolving, however, the red area may spread. It may also become brownish-yellow in color before going away. If a lot of blood collects under the conjunctiva, it may look like a bulge on the surface of the eye. This looks scary, but it will also eventually flatten out and go away. Subconjunctival hemorrhages do not cause pain, but if swollen, may cause a feeling of irritation. There is no effect on vision.  CAUSES   The most common cause is mild trauma (rubbing the eye, irritation).  Subconjunctival hemorrhages can happen because of coughing or straining (lifting heavy objects), vomiting, or sneezing.  In some cases, your doctor may want to check your blood pressure. High blood pressure  can also cause a subconjunctival hemorrhage.  Severe trauma or blunt injuries.  Diseases that affect blood clotting (hemophilia, leukemia).  Abnormalities of blood vessels behind the eye (carotid cavernous sinus fistula).  Tumors behind the eye.  Certain drugs (aspirin, Coumadin, heparin).  Recent eye surgery. HOME CARE INSTRUCTIONS   Do not worry about the appearance of your eye. You may continue your usual activities.  Often, follow-up is not necessary. SEEK MEDICAL CARE IF:   Your eye becomes painful.  The bleeding does not disappear within 3 weeks.  Bleeding occurs elsewhere, for example, under the skin, in the mouth, or in the other eye.  You have recurring subconjunctival hemorrhages. SEEK IMMEDIATE MEDICAL CARE IF:   Your vision changes or you have difficulty seeing.  You develop a severe headache, persistent vomiting, confusion, or abnormal drowsiness (lethargy).  Your eye seems to bulge or protrude from the eye socket.  You notice the sudden appearance of bruises or have spontaneous bleeding elsewhere on your body. Document Released: 03/25/2005 Document Revised:  08/09/2013 Document Reviewed: 02/20/2009 [employer] Patient Information 2015 [place], [place]. This information is not intended to replace advice given to you by your health care provider. Make sure you discuss any questions you have with your health care provider.

## 2014-10-03 NOTE — ED Notes (Signed)
Pt reports a headache that started on Thursday. Pt also has her left eye was red on Friday that has gotten worse. Pt denies any vision problems.

## 2016-10-13 ENCOUNTER — Emergency Department (HOSPITAL_COMMUNITY)
Admission: EM | Admit: 2016-10-13 | Discharge: 2016-10-14 | Disposition: A | Discharge status: Home / Self Care | Payer: Medicare Other | Attending: Emergency Medicine | Admitting: Emergency Medicine

## 2016-10-13 ENCOUNTER — Emergency Department (HOSPITAL_COMMUNITY): Payer: Medicare Other

## 2016-10-13 ENCOUNTER — Encounter (HOSPITAL_COMMUNITY): Payer: Self-pay | Admitting: *Deleted

## 2016-10-13 DIAGNOSIS — M5442 Lumbago with sciatica, left side: Secondary | ICD-10-CM | POA: Insufficient documentation

## 2016-10-13 DIAGNOSIS — Z87891 Personal history of nicotine dependence: Secondary | ICD-10-CM | POA: Insufficient documentation

## 2016-10-13 DIAGNOSIS — M545 Low back pain: Secondary | ICD-10-CM | POA: Diagnosis present

## 2016-10-13 MED ORDER — HYDROCODONE-ACETAMINOPHEN 5-325 MG PO TABS
1.0000 | ORAL_TABLET | Freq: Once | ORAL | Status: AC
  Administered 2016-10-13: 1 via ORAL
  Filled 2016-10-13: qty 1

## 2016-10-13 MED ORDER — DIAZEPAM 5 MG PO TABS
5.0000 mg | ORAL_TABLET | Freq: Once | ORAL | Status: AC
  Administered 2016-10-13: 5 mg via ORAL
  Filled 2016-10-13: qty 1

## 2016-10-13 MED ORDER — IBUPROFEN 800 MG PO TABS
800.0000 mg | ORAL_TABLET | Freq: Once | ORAL | Status: AC
  Administered 2016-10-13: 800 mg via ORAL
  Filled 2016-10-13: qty 1

## 2016-10-13 NOTE — ED Triage Notes (Signed)
Pt reports pain in her right and left buttocks that radiates down her left leg for over 2 months.

## 2016-10-13 NOTE — ED Provider Notes (Signed)
[facility]-EMERGENCY DEPT Provider Note   CSN: [id number] Arrival date & time: 10/13/16  2249     History   Chief Complaint Chief Complaint  Patient presents with  . Back Pain    HPI Kathryn Woods is a 66 y.o. female.  Patient reports progressively worsening back pain over the past week, has had back pain for several months. Reports pain goes across her entire low back and radiates down her left leg. She's been taking BC powders and Excedrin without relief. No prescription medications. She reports having back surgery in 2001 with microdiscectomy. She denies any weakness in her leg, numbness, tingling, bowel or bladder incontinence. No fever. No vomiting. No chest pain or shortness of breath. No history Cancer or IV drug abuse. She reports seeing Dr. Gerlene FeeKritzer several years ago for her back pain but has not seen him recently. She came in tonight because the pain is progressively worsening and she is unable to sleep. She did drive herself to the hospital.   The history is provided by the patient.  Back Pain   Pertinent negatives include no chest pain, no fever, no headaches, no abdominal pain, no dysuria and no weakness.    History reviewed. No pertinent past medical history.  Patient Active Problem List   Diagnosis Date Noted  . Bursitis, shoulder 02/24/2013  . HYPERLIPIDEMIA 02/04/2007  . WRIST SPRAIN, LEFT 02/04/2007  . MIGRAINE HEADACHE 01/15/2007  . BRONCHITIS, CHRONIC NOS 01/15/2007  . CONSTIPATION 01/15/2007  . IBS 01/15/2007  . DISC DISEASE, LUMBOSACRAL SPINE 01/15/2007  . LOW BACK PAIN, CHRONIC 01/15/2007  . MALAISE AND FATIGUE 01/15/2007    Past Surgical History:  Procedure Laterality Date  . BACK SURGERY      OB History    No data available       Home Medications    Prior to Admission medications   Medication Sig Start Date End Date Taking? Authorizing Provider  cephALEXin (KEFLEX) 500 MG capsule Take 1 capsule (500 mg total) by mouth 4 (four) times  daily. 04/19/13   Mancel BaleWentz, Elliott, MD  cyclobenzaprine (FLEXERIL) 10 MG tablet Take 10 mg by mouth 3 (three) times daily as needed for muscle spasms.    [provider]  diazepam (VALIUM) 5 MG tablet Take 5 mg by mouth every 8 (eight) hours as needed for anxiety.    [provider]  gabapentin (NEURONTIN) 100 MG capsule Take 100 mg by mouth at bedtime.    [provider]  HYDROcodone-acetaminophen (NORCO/VICODIN) 5-325 MG per tablet Take 1 tablet by mouth every 4 (four) hours as needed for moderate pain.    [provider]  ibuprofen (ADVIL,MOTRIN) 800 MG tablet Take 800 mg by mouth every 8 (eight) hours as needed.    [provider]  metoCLOPramide (REGLAN) 10 MG tablet Take 1 tablet (10 mg total) by mouth every 6 (six) hours as needed for nausea (or headache). 10/03/14   Dione BoozeGlick, David, MD  naproxen (NAPROSYN) 375 MG tablet Take 375 mg by mouth 2 (two) times daily as needed for mild pain or moderate pain.    [provider]  oxyCODONE-acetaminophen (PERCOCET/ROXICET) 5-325 MG per tablet Take 1 tablet by mouth every 4 (four) hours as needed for severe pain. 04/19/13   Mancel BaleWentz, Elliott, MD    Family History History reviewed. No pertinent family history.  Social History Social History  Substance Use Topics  . Smoking status: Former Games developermoker  . Smokeless tobacco: Never Used  . Alcohol use No  Allergies   Aspirin   Review of Systems Review of Systems  Constitutional: Negative for activity change, appetite change and fever.  HENT: Negative for congestion and rhinorrhea.   Eyes: Negative for visual disturbance.  Respiratory: Negative for cough, chest tightness and shortness of breath.   Cardiovascular: Negative for chest pain.  Gastrointestinal: Negative for abdominal pain, nausea and vomiting.  Genitourinary: Negative for dysuria, hematuria, vaginal bleeding and vaginal discharge.  Musculoskeletal: Positive for arthralgias, back pain and  myalgias.  Skin: Negative for rash.  Neurological: Negative for dizziness, weakness and headaches.   all other systems are negative except as noted in the HPI and PMH.    Physical Exam Updated Vital Signs BP (!) 183/98 (BP Location: Right Arm)   Pulse 70   Temp 98 F (36.7 C) (Oral)   Resp 18   Ht 5\' 6"  (1.676 m)   Wt 72.6 kg (160 lb)   SpO2 97%   BMI 25.82 kg/m   Physical Exam  Constitutional: She is oriented to person, place, and time. She appears well-developed and well-nourished. No distress.  HENT:  Head: Normocephalic and atraumatic.  Mouth/Throat: Oropharynx is clear and moist. No oropharyngeal exudate.  Eyes: Conjunctivae and EOM are normal. Pupils are equal, round, and reactive to light.  Neck: Normal range of motion. Neck supple.  No meningismus.  Cardiovascular: Normal rate, regular rhythm, normal heart sounds and intact distal pulses.   No murmur heard. Pulmonary/Chest: Effort normal and breath sounds normal. No respiratory distress.  Abdominal: Soft. There is no tenderness. There is no rebound and no guarding.  Musculoskeletal: Normal range of motion. She exhibits tenderness. She exhibits no edema.  Paraspinal lumbar tenderness. Tenderness to palpation at L SI joint. 5/5 strength in bilateral lower extremities. Ankle plantar and dorsiflexion intact. Great toe extension intact bilaterally. +2 DP and PT pulses. +2 patellar reflexes bilaterally. Normal gait.   Neurological: She is alert and oriented to person, place, and time. No cranial nerve deficit. She exhibits normal muscle tone. Coordination normal.   5/5 strength throughout. CN 2-12 intact.Equal grip strength.   Skin: Skin is warm. Capillary refill takes less than 2 seconds.  Psychiatric: She has a normal mood and affect. Her behavior is normal.  Nursing note and vitals reviewed.    ED Treatments / Results  Labs (all labs ordered are listed, but only abnormal results are displayed) Labs Reviewed - No  data to display  EKG  EKG Interpretation None       Radiology Dg Lumbar Spine Complete  Result Date: 10/13/2016 CLINICAL DATA:  Low back pain EXAM: LUMBAR SPINE - COMPLETE 4+ VIEW COMPARISON:  MRI 04/16/2013 FINDINGS: Five non rib-bearing lumbar type vertebra. SI joints are patent. Coarse calcification in the left pelvis. Trace anterolisthesis of L3 on L4. Marked narrowing at L5-S1. Mild degenerative changes at L1-L2 and L3-L4. Vertebral body heights are normal. IMPRESSION: Degenerative changes, most marked at L5-S1. No acute osseous abnormality. Electronically Signed   By: Jasmine Pang M.D.   On: 10/13/2016 23:57    Procedures Procedures (including critical care time)  Medications Ordered in ED Medications  HYDROcodone-acetaminophen (NORCO/VICODIN) 5-325 MG per tablet 1 tablet (not administered)  diazepam (VALIUM) tablet 5 mg (not administered)  ibuprofen (ADVIL,MOTRIN) tablet 800 mg (not administered)     Initial Impression / Assessment and Plan / ED Course  I have reviewed the triage vital signs and the nursing notes.  Pertinent labs & imaging results that were available during my care of the  patient were reviewed by me and considered in my medical decision making (see chart for details).     Presently worsening ongoing low back pain rating on her left leg for the past week. Patient did fall but did not sustain direct trauma. She has intact strength and sensation intact pulses.  Low suspicion for cord compression or cauda equina.  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 She'll be treated with a steroid Dosepak as well as muscle relaxers. Follow-up with her neurosurgeon. No evidence of cord compression or cauda equina. Her BP is elevated in the ED without history of the same. Advised to follow up with her PCP regarding this. Return precautions discussed.  Final Clinical Impressions(s)  / ED Diagnoses   Final diagnoses:  Left-sided low back pain with left-sided sciatica, unspecified chronicity    New Prescriptions New Prescriptions   No medications on file     Glynn Octave, MD 10/14/16 [phone]

## 2016-10-14 MED ORDER — METHYLPREDNISOLONE 4 MG PO TBPK: ORAL_TABLET | ORAL | 0 refills | Status: DC

## 2016-10-14 MED ORDER — METHOCARBAMOL 500 MG PO TABS: 500.0000 mg | ORAL_TABLET | Freq: Two times a day (BID) | ORAL | 0 refills | Status: DC

## 2016-10-14 NOTE — ED Notes (Signed)
Dr Manus Gunningancour aware of bp, advised to have pt follow up with pcp

## 2016-10-14 NOTE — Discharge Instructions (Signed)
Take the steroids as prescribed. Follow-up with Dr. Gerlene FeeKritzer. Return to the ED if you develop worsening pain, weakness, numbness, incontinence or any other concerns.

## 2016-11-06 ENCOUNTER — Other Ambulatory Visit (HOSPITAL_COMMUNITY): Payer: Self-pay | Admitting: Neurological Surgery

## 2016-11-06 DIAGNOSIS — R030 Elevated blood-pressure reading, without diagnosis of hypertension: Secondary | ICD-10-CM | POA: Insufficient documentation

## 2016-11-06 DIAGNOSIS — M48061 Spinal stenosis, lumbar region without neurogenic claudication: Secondary | ICD-10-CM

## 2016-11-11 ENCOUNTER — Ambulatory Visit (HOSPITAL_COMMUNITY)
Admission: RE | Admit: 2016-11-11 | Discharge: 2016-11-11 | Disposition: A | Discharge status: Home / Self Care | Payer: Medicare Other | Source: Ambulatory Visit | Attending: Neurological Surgery | Admitting: Neurological Surgery

## 2016-11-11 DIAGNOSIS — M48061 Spinal stenosis, lumbar region without neurogenic claudication: Secondary | ICD-10-CM | POA: Diagnosis present

## 2016-11-11 DIAGNOSIS — Z9889 Other specified postprocedural states: Secondary | ICD-10-CM | POA: Insufficient documentation

## 2016-11-11 DIAGNOSIS — M5126 Other intervertebral disc displacement, lumbar region: Secondary | ICD-10-CM | POA: Insufficient documentation

## 2016-11-11 DIAGNOSIS — M4316 Spondylolisthesis, lumbar region: Secondary | ICD-10-CM | POA: Diagnosis not present

## 2016-11-16 ENCOUNTER — Encounter (HOSPITAL_COMMUNITY): Payer: Self-pay | Admitting: Emergency Medicine

## 2016-11-16 ENCOUNTER — Emergency Department (HOSPITAL_COMMUNITY): Payer: Medicare Other

## 2016-11-16 ENCOUNTER — Emergency Department (HOSPITAL_COMMUNITY)
Admission: EM | Admit: 2016-11-16 | Discharge: 2016-11-17 | Disposition: A | Discharge status: Home / Self Care | Payer: Medicare Other | Attending: Emergency Medicine | Admitting: Emergency Medicine

## 2016-11-16 DIAGNOSIS — R109 Unspecified abdominal pain: Secondary | ICD-10-CM | POA: Diagnosis not present

## 2016-11-16 DIAGNOSIS — Z79899 Other long term (current) drug therapy: Secondary | ICD-10-CM | POA: Diagnosis not present

## 2016-11-16 DIAGNOSIS — R110 Nausea: Secondary | ICD-10-CM | POA: Insufficient documentation

## 2016-11-16 DIAGNOSIS — M545 Low back pain: Secondary | ICD-10-CM | POA: Diagnosis not present

## 2016-11-16 DIAGNOSIS — Z87891 Personal history of nicotine dependence: Secondary | ICD-10-CM | POA: Diagnosis not present

## 2016-11-16 LAB — CBC WITH DIFFERENTIAL/PLATELET
Basophils Absolute: 0 10*3/uL (ref 0.0–0.1)
Basophils Relative: 0 %
Eosinophils Absolute: 0.3 10*3/uL (ref 0.0–0.7)
Eosinophils Relative: 4 %
HCT: 40.4 % (ref 36.0–46.0)
Hemoglobin: 13.5 g/dL (ref 12.0–15.0)
Lymphocytes Relative: 23 %
Lymphs Abs: 1.7 10*3/uL (ref 0.7–4.0)
MCH: 30.7 pg (ref 26.0–34.0)
MCHC: 33.4 g/dL (ref 30.0–36.0)
MCV: 91.8 fL (ref 78.0–100.0)
Monocytes Absolute: 0.5 10*3/uL (ref 0.1–1.0)
Monocytes Relative: 7 %
Neutro Abs: 4.9 10*3/uL (ref 1.7–7.7)
Neutrophils Relative %: 66 %
Platelets: 312 10*3/uL (ref 150–400)
RBC: 4.4 MIL/uL (ref 3.87–5.11)
RDW: 13.8 % (ref 11.5–15.5)
WBC: 7.5 10*3/uL (ref 4.0–10.5)

## 2016-11-16 LAB — HEPATIC FUNCTION PANEL
ALT: 17 U/L (ref 14–54)
AST: 26 U/L (ref 15–41)
Albumin: 4.4 g/dL (ref 3.5–5.0)
Alkaline Phosphatase: 117 U/L (ref 38–126)
Bilirubin, Direct: 0.1 mg/dL (ref 0.1–0.5)
Indirect Bilirubin: 0.4 mg/dL (ref 0.3–0.9)
Total Bilirubin: 0.5 mg/dL (ref 0.3–1.2)
Total Protein: 7.8 g/dL (ref 6.5–8.1)

## 2016-11-16 LAB — BASIC METABOLIC PANEL
Anion gap: 9 (ref 5–15)
BUN: 10 mg/dL (ref 6–20)
CO2: 25 mmol/L (ref 22–32)
Calcium: 9.7 mg/dL (ref 8.9–10.3)
Chloride: 104 mmol/L (ref 101–111)
Creatinine, Ser: 0.92 mg/dL (ref 0.44–1.00)
GFR calc Af Amer: >60 mL/min (ref >60)
GFR calc non Af Amer: >60 mL/min (ref >60)
Glucose, Bld: 107 mg/dL — ABNORMAL HIGH (ref 65–99)
Potassium: 4.1 mmol/L (ref 3.5–5.1)
Sodium: 138 mmol/L (ref 135–145)

## 2016-11-16 LAB — URINALYSIS, ROUTINE W REFLEX MICROSCOPIC
Bacteria, UA: NONE SEEN
Bilirubin Urine: NEGATIVE
Glucose, UA: NEGATIVE mg/dL
Hgb urine dipstick: NEGATIVE
Ketones, ur: NEGATIVE mg/dL
Nitrite: NEGATIVE
Protein, ur: NEGATIVE mg/dL
Specific Gravity, Urine: 1.02 (ref 1.005–1.030)
pH: 5 (ref 5.0–8.0)

## 2016-11-16 LAB — LIPASE, BLOOD: Lipase: 23 U/L (ref 11–51)

## 2016-11-16 MED ORDER — ONDANSETRON HCL 4 MG/2ML IJ SOLN
4.0000 mg | Freq: Once | INTRAMUSCULAR | Status: AC
  Administered 2016-11-16: 4 mg via INTRAVENOUS
  Filled 2016-11-16: qty 2

## 2016-11-16 MED ORDER — KETOROLAC TROMETHAMINE 30 MG/ML IJ SOLN
30.0000 mg | Freq: Once | INTRAMUSCULAR | Status: AC
  Administered 2016-11-16: 30 mg via INTRAVENOUS
  Filled 2016-11-16: qty 1

## 2016-11-16 NOTE — ED Triage Notes (Signed)
Rt side pain since this morning, worse with movement

## 2016-11-16 NOTE — ED Notes (Signed)
Pt denies hx of kidney stones 

## 2016-11-17 MED ORDER — CYCLOBENZAPRINE HCL 10 MG PO TABS: 10.0000 mg | ORAL_TABLET | Freq: Three times a day (TID) | ORAL | 0 refills | Status: DC | PRN

## 2016-11-17 MED ORDER — NAPROXEN 250 MG PO TABS: ORAL_TABLET | ORAL | 0 refills | Status: DC

## 2016-11-17 NOTE — ED Provider Notes (Signed)
[facility] DEPT Provider Note   CSN: [id number] Arrival date & time: 11/16/16  2216  Time seen 23:25 PM  History   Chief Complaint Chief Complaint  Patient presents with  . Flank Pain    HPI Midge Momon is a 66 y.o. female.  HPI  patient reports she's been having pain of her lower back for the past 3 months and has had x-rays and MRI she states the pain radiates down into her legs. She's been evaluated by neurosurgeon, Dr. Danielle Dess. She states she is thinking about surgery but is not ready to have it done at this time. She reports this morning she started having pain and she points to her right lateral abdomen area, she states it started when she was moving in her sleep and it "grabbed me" and it woke her up. He states the pain has been constant. She states any type of movement makes it worse. She states standing still makes it feel better. She denies any change in her activity or any injury that could've started the pain. The pain does not radiate. She has had nausea without vomiting areas she denies dysuria, hematuria, or fever. She states she's never had this pain before. She states she took one of her pain pills today without relief. She states Dr. Danielle Dess prescribed her pain medication however she's only taking 2 pills because "they don't do anything".  PCP Patient, No Pcp Per  Neurosurgery Dr Danielle Dess   History reviewed. No pertinent past medical history.  Patient Active Problem List   Diagnosis Date Noted  . Bursitis, shoulder 02/24/2013  . HYPERLIPIDEMIA 02/04/2007  . WRIST SPRAIN, LEFT 02/04/2007  . MIGRAINE HEADACHE 01/15/2007  . BRONCHITIS, CHRONIC NOS 01/15/2007  . CONSTIPATION 01/15/2007  . IBS 01/15/2007  . DISC DISEASE, LUMBOSACRAL SPINE 01/15/2007  . LOW BACK PAIN, CHRONIC 01/15/2007  . MALAISE AND FATIGUE 01/15/2007    Past Surgical History:  Procedure Laterality Date  . BACK SURGERY      OB History    No data available       Home Medications     Prior to Admission medications   Medication Sig Start Date End Date Taking? Authorizing Provider  cephALEXin (KEFLEX) 500 MG capsule Take 1 capsule (500 mg total) by mouth 4 (four) times daily. 04/19/13   Mancel Bale, MD  cyclobenzaprine (FLEXERIL) 10 MG tablet Take 10 mg by mouth 3 (three) times daily as needed for muscle spasms.    [provider]  diazepam (VALIUM) 5 MG tablet Take 5 mg by mouth every 8 (eight) hours as needed for anxiety.    [provider]  gabapentin (NEURONTIN) 100 MG capsule Take 100 mg by mouth at bedtime.    [provider]  HYDROcodone-acetaminophen (NORCO/VICODIN) 5-325 MG per tablet Take 1 tablet by mouth every 4 (four) hours as needed for moderate pain.    [provider]  ibuprofen (ADVIL,MOTRIN) 800 MG tablet Take 800 mg by mouth every 8 (eight) hours as needed.    [provider]  methocarbamol (ROBAXIN) 500 MG tablet Take 1 tablet (500 mg total) by mouth 2 (two) times daily. 10/14/16   Rancour, Jeannett Senior, MD  methylPREDNISolone (MEDROL DOSEPAK) 4 MG TBPK tablet As directed 10/14/16   Rancour, Jeannett Senior, MD  metoCLOPramide (REGLAN) 10 MG tablet Take 1 tablet (10 mg total) by mouth every 6 (six) hours as needed for nausea (or headache). 10/03/14   Dione Booze, MD  naproxen (NAPROSYN) 375 MG tablet Take 375 mg by mouth  2 (two) times daily as needed for mild pain or moderate pain.    [provider]  oxyCODONE-acetaminophen (PERCOCET/ROXICET) 5-325 MG per tablet Take 1 tablet by mouth every 4 (four) hours as needed for severe pain. 04/19/13   Mancel Bale, MD    Family History No family history on file.  Social History Social History  Substance Use Topics  . Smoking status: Former Games developer  . Smokeless tobacco: Never Used  . Alcohol use No  retired   Allergies   Aspirin   Review of Systems Review of Systems  All other systems reviewed and are negative.    Physical Exam Updated Vital Signs BP (!)  155/96 (BP Location: Right Arm)   Pulse 80   Temp 98.3 F (36.8 C) (Oral)   Resp 18   Ht 5\' 6"  (1.676 m)   Wt 76.7 kg (169 lb)   SpO2 96%   BMI 27.28 kg/m   Vital signs normal    Physical Exam  Constitutional: She is oriented to person, place, and time. She appears well-developed and well-nourished.  Non-toxic appearance. She does not appear ill.  Appears uncomfortable, sitting on the side of the bed holding her right lateral abdomen  HENT:  Head: Normocephalic and atraumatic.  Right Ear: External ear normal.  Left Ear: External ear normal.  Nose: Nose normal. No mucosal edema or rhinorrhea.  Mouth/Throat: Oropharynx is clear and moist and mucous membranes are normal. No dental abscesses or uvula swelling.  Eyes: Pupils are equal, round, and reactive to light. Conjunctivae and EOM are normal.  Neck: Normal range of motion and full passive range of motion without pain. Neck supple.  Cardiovascular: Normal rate, regular rhythm and normal heart sounds.  Exam reveals no gallop and no friction rub.   No murmur heard. Pulmonary/Chest: Effort normal and breath sounds normal. No respiratory distress. She has no wheezes. She has no rhonchi. She has no rales. She exhibits no tenderness and no crepitus.  Abdominal: Soft. Normal appearance and bowel sounds are normal. She exhibits no distension. There is no tenderness. There is no rebound and no guarding.    She is tender to palpation along the right lateral abdominal wall.  Musculoskeletal: Normal range of motion. She exhibits no edema or tenderness.  Moves all extremities well.  nontender thoracic and lumbar spine, nontender in the right CVA Patient does not have pain when she does lumbar flexion to left, however lumbar flexion to the right is extremely limited due to discomfort. She also states when she took a big deep breath for her lung exam and made the area hurt.  Neurological: She is alert and oriented to person, place, and time. She  has normal strength. No cranial nerve deficit.  Skin: Skin is warm, dry and intact. No rash noted. No erythema. No pallor.  Psychiatric: She has a normal mood and affect. Her speech is normal and behavior is normal. Her mood appears not anxious.  Nursing note and vitals reviewed.    ED Treatments / Results  Labs (all labs ordered are listed, but only abnormal results are displayed) Results for orders placed or performed during the hospital encounter of 11/16/16  Urinalysis, Routine w reflex microscopic  Result Value Ref Range   Color, Urine YELLOW YELLOW   APPearance HAZY (A) CLEAR   Specific Gravity, Urine 1.020 1.005 - 1.030   pH 5.0 5.0 - 8.0   Glucose, UA NEGATIVE NEGATIVE mg/dL   Hgb urine dipstick NEGATIVE NEGATIVE   Bilirubin  Urine NEGATIVE NEGATIVE   Ketones, ur NEGATIVE NEGATIVE mg/dL   Protein, ur NEGATIVE NEGATIVE mg/dL   Nitrite NEGATIVE NEGATIVE   Leukocytes, UA LARGE (A) NEGATIVE   RBC / HPF 0-5 0 - 5 RBC/hpf   WBC, UA 0-5 0 - 5 WBC/hpf   Bacteria, UA NONE SEEN NONE SEEN   Squamous Epithelial / LPF 0-5 (A) NONE SEEN   Mucous PRESENT   CBC with Differential  Result Value Ref Range   WBC 7.5 4.0 - 10.5 K/uL   RBC 4.40 3.87 - 5.11 MIL/uL   Hemoglobin 13.5 12.0 - 15.0 g/dL   HCT 16.1 09.6 - 04.5 %   MCV 91.8 78.0 - 100.0 fL   MCH 30.7 26.0 - 34.0 pg   MCHC 33.4 30.0 - 36.0 g/dL   RDW 40.9 81.1 - 91.4 %   Platelets 312 150 - 400 K/uL   Neutrophils Relative % 66 %   Neutro Abs 4.9 1.7 - 7.7 K/uL   Lymphocytes Relative 23 %   Lymphs Abs 1.7 0.7 - 4.0 K/uL   Monocytes Relative 7 %   Monocytes Absolute 0.5 0.1 - 1.0 K/uL   Eosinophils Relative 4 %   Eosinophils Absolute 0.3 0.0 - 0.7 K/uL   Basophils Relative 0 %   Basophils Absolute 0.0 0.0 - 0.1 K/uL  Basic metabolic panel  Result Value Ref Range   Sodium 138 135 - 145 mmol/L   Potassium 4.1 3.5 - 5.1 mmol/L   Chloride 104 101 - 111 mmol/L   CO2 25 22 - 32 mmol/L   Glucose, Bld 107 (H) 65 - 99 mg/dL    BUN 10 6 - 20 mg/dL   Creatinine, Ser 7.82 0.44 - 1.00 mg/dL   Calcium 9.7 8.9 - 95.6 mg/dL   GFR calc non Af Amer >60 >60 mL/min   GFR calc Af Amer >60 >60 mL/min   Anion gap 9 5 - 15  Lipase, blood  Result Value Ref Range   Lipase 23 11 - 51 U/L  Hepatic function panel  Result Value Ref Range   Total Protein 7.8 6.5 - 8.1 g/dL   Albumin 4.4 3.5 - 5.0 g/dL   AST 26 15 - 41 U/L   ALT 17 14 - 54 U/L   Alkaline Phosphatase 117 38 - 126 U/L   Total Bilirubin 0.5 0.3 - 1.2 mg/dL   Bilirubin, Direct 0.1 0.1 - 0.5 mg/dL   Indirect Bilirubin 0.4 0.3 - 0.9 mg/dL   Laboratory interpretation all normal except Leukocytes in urine without other indication of UTI    EKG  EKG Interpretation None       Radiology Ct Renal Stone Study  Result Date: 11/17/2016 CLINICAL DATA:  Acute onset of right-sided abdominal pain. Initial encounter. EXAM: CT ABDOMEN AND PELVIS WITHOUT CONTRAST TECHNIQUE: Multidetector CT imaging of the abdomen and pelvis was performed following the standard protocol without IV contrast. COMPARISON:  MRI of the lumbar spine performed 11/11/2016 FINDINGS: Lower chest: Minimal bibasilar scarring is noted. The visualized portions of the mediastinum are unremarkable. Hepatobiliary: The liver is unremarkable in appearance. The gallbladder is unremarkable in appearance. The common bile duct remains normal in caliber. Pancreas: The pancreas is within normal limits. Spleen: The spleen is unremarkable in appearance. Adrenals/Urinary Tract: The adrenal glands are unremarkable in appearance. A 3 mm nonobstructing stone is noted at the interpole region of the right kidney. There is incomplete rotation of the left kidney. There is no evidence of hydronephrosis. No obstructing  ureteral stones are identified. No perinephric stranding is seen. Stomach/Bowel: The stomach is unremarkable in appearance. The small bowel is within normal limits. The appendix is normal in caliber, without evidence of  appendicitis. Minimal diverticulosis is noted along the ascending colon, without evidence of diverticulitis. Vascular/Lymphatic: Mild scattered calcification is seen along the abdominal aorta and its branches. No retroperitoneal or pelvic sidewall lymphadenopathy is seen. Reproductive: The bladder is mildly distended and within normal limits. Small calcified uterine fibroids are noted. The ovaries are relatively symmetric. No suspicious adnexal masses are seen. Other: No additional soft tissue abnormalities are seen. Musculoskeletal: No acute osseous abnormalities are identified. Endplate sclerotic change is noted at L5-S1. The visualized musculature is unremarkable in appearance. IMPRESSION: 1. No acute abnormality seen to explain the patient's symptoms. 2. 3 mm nonobstructing stone at the interpole region of the right kidney. 3. Minimal diverticulosis along the ascending colon, without evidence of diverticulitis. 4. Mild scattered aortic atherosclerosis. 5. Small calcified uterine fibroids noted. Electronically Signed   By: Roanna RaiderJeffery  Chang M.D.   On: 11/17/2016 00:08    Procedures Procedures (including critical care time)  Medications Ordered in ED Medications  ketorolac (TORADOL) 30 MG/ML injection 30 mg (30 mg Intravenous Given 11/16/16 2348)  ondansetron (ZOFRAN) injection 4 mg (4 mg Intravenous Given 11/16/16 2348)     Initial Impression / Assessment and Plan / ED Course  I have reviewed the triage vital signs and the nursing notes.  Pertinent labs & imaging results that were available during my care of the patient were reviewed by me and considered in my medical decision making (see chart for details).    She was given IV Toradol and IV Zofran for her pain and nausea. CT scan of the abdomen, renal study was done to look for kidney stone, inflammation gallbladder, colitis. Although by her exam I suspect this is going to be musculoskeletal in origin.  Recheck it 12:55 AM patient is laying flat  on the stretcher now. She states her pain is better but not gone. We discussed her test results. Patient was discharged home with anti-inflammatory and muscle relaxer.   [facility] shows Dr. Danielle DessElsner prescribed #20 hydrocodone 5/325 on August 8  Final Clinical Impressions(s) / ED Diagnoses   Final diagnoses:  None    New Prescriptions New Prescriptions   No medications on file     Devoria AlbeKnapp, Lisl Slingerland, MD 11/17/16 0126

## 2016-11-17 NOTE — Discharge Instructions (Signed)
Use ice and heat for comfort. Take the medications for your pain as prescribed. Recheck if you get a fever or seem worse.

## 2016-11-18 ENCOUNTER — Other Ambulatory Visit: Payer: Self-pay | Admitting: Neurological Surgery

## 2016-11-25 ENCOUNTER — Encounter (HOSPITAL_COMMUNITY): Payer: Self-pay

## 2016-11-25 ENCOUNTER — Telehealth (HOSPITAL_COMMUNITY): Payer: Self-pay | Admitting: General Practice

## 2016-11-25 ENCOUNTER — Ambulatory Visit (HOSPITAL_COMMUNITY): Payer: Medicare Other | Admitting: Physical Therapy

## 2016-11-25 NOTE — Telephone Encounter (Signed)
11/25/16  pt left a message that she was cancelling becuase she feels that therapy will be too painful, she is going back to her dr and if she decides to do therapy she will call us back

## 2016-12-04 NOTE — Pre-Procedure Instructions (Signed)
Nobie Putnamatricia Nicolini  12/04/2016      [facility] [id number] - [place], [place] - [address] [place] [address] Phone: [phone] Fax: [phone]  [facility] [address] - [place], [place] - [address] [place] [address] [place] [address] [place] [address] Phone: [phone] Fax: [phone]    Your procedure is scheduled on  Tuesday 12/10/16  [facility] at 530 A.M.  Call this number if you have problems the morning of surgery:  [phone]   Remember:  Do not eat food or drink liquids after midnight.  Take these medicines the morning of surgery with A SIP OF WATER  HYDROCODONE IF NEEDED  7 days prior to surgery STOP taking any Aspirin, Aleve, Naproxen, Ibuprofen, Motrin, Advil, Goody's, BC's, all herbal medications, fish oil, and all vitamins   Do not wear jewelry, make-up or nail polish.  Do not wear lotions, powders, or perfumes, or deoderant.  Do not shave 48 hours prior to surgery.  Men may shave face and neck.  Do not bring valuables to the hospital.  [facility] is not responsible for any belongings or valuables.  Contacts, dentures or bridgework may not be worn into surgery.  Leave your suitcase in the car.  After surgery it may be brought to your room.  For patients admitted to the hospital, discharge time will be determined by your treatment team.  Patients discharged the day of surgery will not be allowed to drive home.   Name and phone number of your driver:    Special instructions:  [facility] - Preparing for Surgery  Before surgery, you can play an important role.  Because skin is not sterile, your skin needs to be as free of germs as possible.  You can reduce the number of germs on you skin by washing with CHG (chlorahexidine gluconate) soap before surgery.  CHG is an antiseptic cleaner which kills germs and bonds with the skin to continue killing germs even  after washing.  Please DO NOT use if you have an allergy to CHG or antibacterial soaps.  If your skin becomes reddened/irritated stop using the CHG and inform your nurse when you arrive at Short Stay.  Do not shave (including legs and underarms) for at least 48 hours prior to the first CHG shower.  You may shave your face.  Please follow these instructions carefully:   1.  Shower with CHG Soap the night before surgery and the                                morning of Surgery.  2.  If you choose to wash your hair, wash your hair first as usual with your       normal shampoo.  3.  After you shampoo, rinse your hair and body thoroughly to remove the                      Shampoo.  4.  Use CHG as you would any other liquid soap.  You can apply chg directly       to the skin and wash gently with scrungie or a clean washcloth.  5.  Apply the CHG Soap to your body ONLY FROM THE NECK DOWN.        Do not use on open  wounds or open sores.  Avoid contact with your eyes,       ears, mouth and genitals (private parts).  Wash genitals (private parts)       with your normal soap.  6.  Wash thoroughly, paying special attention to the area where your surgery        will be performed.  7.  Thoroughly rinse your body with warm water from the neck down.  8.  DO NOT shower/wash with your normal soap after using and rinsing off       the CHG Soap.  9.  Pat yourself dry with a clean towel.            10.  Wear clean pajamas.            11.  Place clean sheets on your bed the night of your first shower and do not        sleep with pets.  Day of Surgery  Do not apply any lotions/deoderants the morning of surgery.  Please wear clean clothes to the hospital/surgery center.    Please read over the following fact sheets that you were given. Pain Booklet, Coughing and Deep Breathing, MRSA Information and Surgical Site Infection Prevention

## 2016-12-05 ENCOUNTER — Encounter (HOSPITAL_COMMUNITY)
Admission: RE | Admit: 2016-12-05 | Discharge: 2016-12-05 | Disposition: A | Discharge status: Home / Self Care | Payer: Medicare Other | Source: Ambulatory Visit | Attending: Neurological Surgery | Admitting: Neurological Surgery

## 2016-12-05 ENCOUNTER — Encounter (HOSPITAL_COMMUNITY): Payer: Self-pay

## 2016-12-05 DIAGNOSIS — Z01818 Encounter for other preprocedural examination: Secondary | ICD-10-CM | POA: Insufficient documentation

## 2016-12-05 DIAGNOSIS — M4316 Spondylolisthesis, lumbar region: Secondary | ICD-10-CM | POA: Insufficient documentation

## 2016-12-05 HISTORY — DX: Unspecified osteoarthritis, unspecified site: M19.90

## 2016-12-05 LAB — CBC
HCT: 38.3 % (ref 36.0–46.0)
Hemoglobin: 12.5 g/dL (ref 12.0–15.0)
MCH: 29.1 pg (ref 26.0–34.0)
MCHC: 32.6 g/dL (ref 30.0–36.0)
MCV: 89.3 fL (ref 78.0–100.0)
Platelets: 270 10*3/uL (ref 150–400)
RBC: 4.29 MIL/uL (ref 3.87–5.11)
RDW: 13.7 % (ref 11.5–15.5)
WBC: 6.5 10*3/uL (ref 4.0–10.5)

## 2016-12-05 LAB — ABO/RH: ABO/RH(D): O POS

## 2016-12-05 LAB — TYPE AND SCREEN
ABO/RH(D): O POS
Antibody Screen: NEGATIVE

## 2016-12-05 LAB — BASIC METABOLIC PANEL
Anion gap: 8 (ref 5–15)
BUN: 7 mg/dL (ref 6–20)
CO2: 27 mmol/L (ref 22–32)
Calcium: 9.5 mg/dL (ref 8.9–10.3)
Chloride: 105 mmol/L (ref 101–111)
Creatinine, Ser: 1.01 mg/dL — ABNORMAL HIGH (ref 0.44–1.00)
GFR calc Af Amer: >60 mL/min (ref >60)
GFR calc non Af Amer: 56 mL/min — ABNORMAL LOW (ref >60)
Glucose, Bld: 108 mg/dL — ABNORMAL HIGH (ref 65–99)
Potassium: 3.3 mmol/L — ABNORMAL LOW (ref 3.5–5.1)
Sodium: 140 mmol/L (ref 135–145)

## 2016-12-05 LAB — SURGICAL PCR SCREEN
MRSA, PCR: NEGATIVE
Staphylococcus aureus: NEGATIVE

## 2016-12-05 NOTE — Progress Notes (Signed)
Pt. Reports that she doesn't have a PCP.  Pt. States that she played softball regularly up until a yr. Ago.  Pt. Reports that she doesn't like to take med.s & much of what they have Rx for the back problem she has not taken.  The only med. that has helped is the Hydrocodone.  Pt. Denies chest concerns. Pt. Denies any cardiac testing in the past.

## 2016-12-09 NOTE — Anesthesia Preprocedure Evaluation (Addendum)
Anesthesia Evaluation  Patient identified by MRN, date of birth, ID band  Reviewed: Allergy & Precautions, NPO status , Patient's Chart, lab work & pertinent test results  History of Anesthesia Complications Negative for: history of anesthetic complications  Airway Mallampati: II  TM Distance: <3 FB Neck ROM: Full    Dental  (+) Edentulous Upper, Edentulous Lower   Pulmonary former smoker,    breath sounds clear to auscultation       Cardiovascular negative cardio ROS   Rhythm:Regular Rate:Normal     Neuro/Psych    GI/Hepatic negative GI ROS, Neg liver ROS,   Endo/Other  negative endocrine ROS  Renal/GU negative Renal ROS     Musculoskeletal  (+) Arthritis , Back pain   Abdominal   Peds  Hematology   Anesthesia Other Findings   Reproductive/Obstetrics                            Anesthesia Physical Anesthesia Plan  ASA: II  Anesthesia Plan: General   Post-op Pain Management:    Induction: Intravenous  PONV Risk Score and Plan: 4 or greater and Ondansetron, Dexamethasone, Midazolam, Scopolamine patch - Pre-op and Treatment may vary due to age or medical condition  Airway Management Planned: Oral ETT  Additional Equipment:   Intra-op Plan:   Post-operative Plan: Extubation in OR  Informed Consent: I have reviewed the patients History and Physical, chart, labs and discussed the procedure including the risks, benefits and alternatives for the proposed anesthesia with the patient or authorized representative who has indicated his/her understanding and acceptance.   Dental advisory given  Plan Discussed with: CRNA  Anesthesia Plan Comments:         Anesthesia Quick Evaluation

## 2016-12-10 ENCOUNTER — Encounter (HOSPITAL_COMMUNITY)
Admission: RE | Disposition: A | Discharge status: Home / Self Care | Payer: Self-pay | Source: Home / Self Care | Attending: Neurological Surgery

## 2016-12-10 ENCOUNTER — Inpatient Hospital Stay (HOSPITAL_COMMUNITY)
Admission: RE | Admit: 2016-12-10 | Discharge: 2016-12-12 | DRG: 455 | Disposition: A | Discharge status: Home / Self Care | Payer: Medicare Other | Attending: Neurological Surgery | Admitting: Neurological Surgery

## 2016-12-10 ENCOUNTER — Inpatient Hospital Stay (HOSPITAL_COMMUNITY): Payer: Medicare Other | Admitting: Anesthesiology

## 2016-12-10 ENCOUNTER — Inpatient Hospital Stay (HOSPITAL_COMMUNITY): Payer: Medicare Other

## 2016-12-10 ENCOUNTER — Encounter (HOSPITAL_COMMUNITY): Payer: Self-pay | Admitting: Anesthesiology

## 2016-12-10 DIAGNOSIS — M48061 Spinal stenosis, lumbar region without neurogenic claudication: Secondary | ICD-10-CM | POA: Diagnosis present

## 2016-12-10 DIAGNOSIS — M545 Low back pain: Secondary | ICD-10-CM | POA: Diagnosis present

## 2016-12-10 DIAGNOSIS — Z87891 Personal history of nicotine dependence: Secondary | ICD-10-CM

## 2016-12-10 DIAGNOSIS — M479 Spondylosis, unspecified: Secondary | ICD-10-CM | POA: Diagnosis present

## 2016-12-10 DIAGNOSIS — M5416 Radiculopathy, lumbar region: Secondary | ICD-10-CM | POA: Diagnosis present

## 2016-12-10 DIAGNOSIS — Z419 Encounter for procedure for purposes other than remedying health state, unspecified: Secondary | ICD-10-CM

## 2016-12-10 DIAGNOSIS — M4316 Spondylolisthesis, lumbar region: Secondary | ICD-10-CM

## 2016-12-10 SURGERY — POSTERIOR LUMBAR FUSION 1 LEVEL
Anesthesia: General | Site: Spine Lumbar

## 2016-12-10 MED ORDER — THROMBIN 20000 UNITS EX SOLR
CUTANEOUS | Status: AC
  Filled 2016-12-10: qty 20000

## 2016-12-10 MED ORDER — SODIUM CHLORIDE 0.9 % IV SOLN
INTRAVENOUS | Status: DC
  Administered 2016-12-10: 12:00:00 via INTRAVENOUS

## 2016-12-10 MED ORDER — EPHEDRINE SULFATE 50 MG/ML IJ SOLN
INTRAMUSCULAR | Status: DC | PRN
  Administered 2016-12-10: 5 mg via INTRAVENOUS

## 2016-12-10 MED ORDER — ONDANSETRON HCL 4 MG/2ML IJ SOLN
INTRAMUSCULAR | Status: AC
  Filled 2016-12-10: qty 2

## 2016-12-10 MED ORDER — SCOPOLAMINE 1 MG/3DAYS TD PT72
MEDICATED_PATCH | TRANSDERMAL | Status: DC | PRN
  Administered 2016-12-10: 1 via TRANSDERMAL

## 2016-12-10 MED ORDER — PROPOFOL 10 MG/ML IV BOLUS
INTRAVENOUS | Status: AC
  Filled 2016-12-10: qty 40

## 2016-12-10 MED ORDER — LIDOCAINE HCL (CARDIAC) 20 MG/ML IV SOLN
INTRAVENOUS | Status: DC | PRN
  Administered 2016-12-10: 100 mg via INTRAVENOUS

## 2016-12-10 MED ORDER — ALBUMIN HUMAN 5 % IV SOLN
INTRAVENOUS | Status: DC | PRN
  Administered 2016-12-10: 10:00:00 via INTRAVENOUS

## 2016-12-10 MED ORDER — DEXAMETHASONE SODIUM PHOSPHATE 10 MG/ML IJ SOLN
INTRAMUSCULAR | Status: AC
  Filled 2016-12-10: qty 2

## 2016-12-10 MED ORDER — SUCCINYLCHOLINE CHLORIDE 20 MG/ML IJ SOLN
INTRAMUSCULAR | Status: DC | PRN
  Administered 2016-12-10: 100 mg via INTRAVENOUS

## 2016-12-10 MED ORDER — BISACODYL 10 MG RE SUPP: 10.0000 mg | Freq: Every day | RECTAL | Status: DC | PRN

## 2016-12-10 MED ORDER — 0.9 % SODIUM CHLORIDE (POUR BTL) OPTIME
TOPICAL | Status: DC | PRN
  Administered 2016-12-10: 1000 mL

## 2016-12-10 MED ORDER — SUGAMMADEX SODIUM 200 MG/2ML IV SOLN
INTRAVENOUS | Status: DC | PRN
  Administered 2016-12-10: 160 mg via INTRAVENOUS

## 2016-12-10 MED ORDER — SURGIFOAM 100 EX MISC
CUTANEOUS | Status: DC | PRN
  Administered 2016-12-10: 09:00:00 via TOPICAL

## 2016-12-10 MED ORDER — SUCCINYLCHOLINE CHLORIDE 200 MG/10ML IV SOSY
PREFILLED_SYRINGE | INTRAVENOUS | Status: AC
  Filled 2016-12-10: qty 10

## 2016-12-10 MED ORDER — PHENYLEPHRINE HCL 10 MG/ML IJ SOLN
INTRAMUSCULAR | Status: DC | PRN
  Administered 2016-12-10: 80 ug via INTRAVENOUS

## 2016-12-10 MED ORDER — LIDOCAINE-EPINEPHRINE 1 %-1:100000 IJ SOLN
INTRAMUSCULAR | Status: DC | PRN
  Administered 2016-12-10: 5 mL

## 2016-12-10 MED ORDER — PROMETHAZINE HCL 25 MG/ML IJ SOLN: 6.2500 mg | INTRAMUSCULAR | Status: DC | PRN

## 2016-12-10 MED ORDER — FENTANYL CITRATE (PF) 100 MCG/2ML IJ SOLN
INTRAMUSCULAR | Status: DC | PRN
  Administered 2016-12-10 (×4): 50 ug via INTRAVENOUS

## 2016-12-10 MED ORDER — METHOCARBAMOL 500 MG PO TABS
500.0000 mg | ORAL_TABLET | Freq: Four times a day (QID) | ORAL | Status: DC | PRN
  Administered 2016-12-10 – 2016-12-12 (×7): 500 mg via ORAL
  Filled 2016-12-10 (×7): qty 1

## 2016-12-10 MED ORDER — ACETAMINOPHEN 650 MG RE SUPP: 650.0000 mg | RECTAL | Status: DC | PRN

## 2016-12-10 MED ORDER — SODIUM CHLORIDE 0.9 % IV SOLN: 250.0000 mL | INTRAVENOUS | Status: DC

## 2016-12-10 MED ORDER — FLEET ENEMA 7-19 GM/118ML RE ENEM: 1.0000 | ENEMA | Freq: Once | RECTAL | Status: DC | PRN

## 2016-12-10 MED ORDER — PHENOL 1.4 % MT LIQD: 1.0000 | OROMUCOSAL | Status: DC | PRN

## 2016-12-10 MED ORDER — DEXAMETHASONE SODIUM PHOSPHATE 10 MG/ML IJ SOLN
INTRAMUSCULAR | Status: DC | PRN
  Administered 2016-12-10: 10 mg via INTRAVENOUS

## 2016-12-10 MED ORDER — ROCURONIUM BROMIDE 10 MG/ML (PF) SYRINGE
PREFILLED_SYRINGE | INTRAVENOUS | Status: AC
  Filled 2016-12-10: qty 5

## 2016-12-10 MED ORDER — SENNA 8.6 MG PO TABS
1.0000 | ORAL_TABLET | Freq: Two times a day (BID) | ORAL | Status: DC
  Administered 2016-12-10 – 2016-12-11 (×4): 8.6 mg via ORAL
  Filled 2016-12-10 (×4): qty 1

## 2016-12-10 MED ORDER — SODIUM CHLORIDE 0.9% FLUSH
3.0000 mL | Freq: Two times a day (BID) | INTRAVENOUS | Status: DC
  Administered 2016-12-10: 3 mL via INTRAVENOUS

## 2016-12-10 MED ORDER — MIDAZOLAM HCL 2 MG/2ML IJ SOLN
INTRAMUSCULAR | Status: AC
  Filled 2016-12-10: qty 2

## 2016-12-10 MED ORDER — ROCURONIUM BROMIDE 100 MG/10ML IV SOLN
INTRAVENOUS | Status: DC | PRN
  Administered 2016-12-10: 20 mg via INTRAVENOUS
  Administered 2016-12-10: 50 mg via INTRAVENOUS

## 2016-12-10 MED ORDER — BUPIVACAINE HCL (PF) 0.5 % IJ SOLN
INTRAMUSCULAR | Status: AC
  Filled 2016-12-10: qty 30

## 2016-12-10 MED ORDER — HYDROMORPHONE HCL 1 MG/ML IJ SOLN
0.2500 mg | INTRAMUSCULAR | Status: DC | PRN
  Administered 2016-12-10: 0.5 mg via INTRAVENOUS

## 2016-12-10 MED ORDER — METHOCARBAMOL 1000 MG/10ML IJ SOLN
500.0000 mg | Freq: Four times a day (QID) | INTRAVENOUS | Status: DC | PRN
  Filled 2016-12-10: qty 5

## 2016-12-10 MED ORDER — ONDANSETRON HCL 4 MG PO TABS: 4.0000 mg | ORAL_TABLET | Freq: Four times a day (QID) | ORAL | Status: DC | PRN

## 2016-12-10 MED ORDER — ONDANSETRON HCL 4 MG/2ML IJ SOLN
INTRAMUSCULAR | Status: DC | PRN
  Administered 2016-12-10: 4 mg via INTRAVENOUS

## 2016-12-10 MED ORDER — THROMBIN 5000 UNITS EX SOLR
OROMUCOSAL | Status: DC | PRN
  Administered 2016-12-10: 09:00:00 via TOPICAL

## 2016-12-10 MED ORDER — HYDROCODONE-ACETAMINOPHEN 5-325 MG PO TABS: 1.0000 | ORAL_TABLET | Freq: Two times a day (BID) | ORAL | Status: DC | PRN

## 2016-12-10 MED ORDER — ACETAMINOPHEN 325 MG PO TABS: 650.0000 mg | ORAL_TABLET | ORAL | Status: DC | PRN

## 2016-12-10 MED ORDER — PROPOFOL 10 MG/ML IV BOLUS
INTRAVENOUS | Status: DC | PRN
  Administered 2016-12-10: 160 mg via INTRAVENOUS

## 2016-12-10 MED ORDER — DEXAMETHASONE SODIUM PHOSPHATE 10 MG/ML IJ SOLN
INTRAMUSCULAR | Status: AC
  Filled 2016-12-10: qty 1

## 2016-12-10 MED ORDER — CHLORHEXIDINE GLUCONATE CLOTH 2 % EX PADS: 6.0000 | MEDICATED_PAD | Freq: Once | CUTANEOUS | Status: DC

## 2016-12-10 MED ORDER — PHENYLEPHRINE 40 MCG/ML (10ML) SYRINGE FOR IV PUSH (FOR BLOOD PRESSURE SUPPORT)
PREFILLED_SYRINGE | INTRAVENOUS | Status: AC
  Filled 2016-12-10: qty 10

## 2016-12-10 MED ORDER — SUGAMMADEX SODIUM 200 MG/2ML IV SOLN
INTRAVENOUS | Status: AC
  Filled 2016-12-10: qty 2

## 2016-12-10 MED ORDER — BUPIVACAINE HCL (PF) 0.5 % IJ SOLN
INTRAMUSCULAR | Status: DC | PRN
  Administered 2016-12-10: 5 mL
  Administered 2016-12-10: 20 mL

## 2016-12-10 MED ORDER — SCOPOLAMINE 1 MG/3DAYS TD PT72
MEDICATED_PATCH | TRANSDERMAL | Status: AC
  Filled 2016-12-10: qty 1

## 2016-12-10 MED ORDER — LIDOCAINE-EPINEPHRINE 1 %-1:100000 IJ SOLN
INTRAMUSCULAR | Status: AC
  Filled 2016-12-10: qty 1

## 2016-12-10 MED ORDER — CEFAZOLIN SODIUM-DEXTROSE 2-4 GM/100ML-% IV SOLN
2.0000 g | Freq: Three times a day (TID) | INTRAVENOUS | Status: AC
  Administered 2016-12-10 (×2): 2 g via INTRAVENOUS
  Filled 2016-12-10 (×2): qty 100

## 2016-12-10 MED ORDER — SODIUM CHLORIDE 0.9 % IR SOLN
Status: DC | PRN
  Administered 2016-12-10: 08:00:00

## 2016-12-10 MED ORDER — HYDROMORPHONE HCL 1 MG/ML IJ SOLN
INTRAMUSCULAR | Status: AC
  Filled 2016-12-10: qty 1

## 2016-12-10 MED ORDER — SUGAMMADEX SODIUM 200 MG/2ML IV SOLN
INTRAVENOUS | Status: AC
  Filled 2016-12-10: qty 4

## 2016-12-10 MED ORDER — POLYETHYLENE GLYCOL 3350 17 G PO PACK
17.0000 g | PACK | Freq: Every day | ORAL | Status: DC | PRN
  Administered 2016-12-11: 17 g via ORAL
  Filled 2016-12-10: qty 1

## 2016-12-10 MED ORDER — HYDROCODONE-ACETAMINOPHEN 5-325 MG PO TABS
1.0000 | ORAL_TABLET | ORAL | Status: DC | PRN
  Administered 2016-12-10 – 2016-12-12 (×12): 2 via ORAL
  Filled 2016-12-10 (×12): qty 2

## 2016-12-10 MED ORDER — EPHEDRINE 5 MG/ML INJ
INTRAVENOUS | Status: AC
  Filled 2016-12-10: qty 10

## 2016-12-10 MED ORDER — SODIUM CHLORIDE 0.9% FLUSH: 3.0000 mL | INTRAVENOUS | Status: DC | PRN

## 2016-12-10 MED ORDER — ONDANSETRON HCL 4 MG/2ML IJ SOLN: 4.0000 mg | Freq: Four times a day (QID) | INTRAMUSCULAR | Status: DC | PRN

## 2016-12-10 MED ORDER — LACTATED RINGERS IV SOLN
INTRAVENOUS | Status: DC | PRN
  Administered 2016-12-10 (×2): via INTRAVENOUS

## 2016-12-10 MED ORDER — MENTHOL 3 MG MT LOZG: 1.0000 | LOZENGE | OROMUCOSAL | Status: DC | PRN

## 2016-12-10 MED ORDER — LIDOCAINE 2% (20 MG/ML) 5 ML SYRINGE
INTRAMUSCULAR | Status: AC
  Filled 2016-12-10: qty 5

## 2016-12-10 MED ORDER — THROMBIN 5000 UNITS EX SOLR
CUTANEOUS | Status: AC
  Filled 2016-12-10: qty 5000

## 2016-12-10 MED ORDER — CEFAZOLIN SODIUM-DEXTROSE 2-4 GM/100ML-% IV SOLN
2.0000 g | INTRAVENOUS | Status: AC
  Administered 2016-12-10: 2 g via INTRAVENOUS
  Filled 2016-12-10: qty 100

## 2016-12-10 MED ORDER — MORPHINE SULFATE (PF) 4 MG/ML IV SOLN: 2.0000 mg | INTRAVENOUS | Status: DC | PRN

## 2016-12-10 MED ORDER — FENTANYL CITRATE (PF) 250 MCG/5ML IJ SOLN
INTRAMUSCULAR | Status: AC
  Filled 2016-12-10: qty 5

## 2016-12-10 MED ORDER — DOCUSATE SODIUM 100 MG PO CAPS
100.0000 mg | ORAL_CAPSULE | Freq: Two times a day (BID) | ORAL | Status: DC
  Administered 2016-12-10 – 2016-12-12 (×5): 100 mg via ORAL
  Filled 2016-12-10 (×4): qty 1

## 2016-12-10 MED FILL — Sodium Chloride IV Soln 0.9%: INTRAVENOUS | Qty: 1000 | Status: AC

## 2016-12-10 MED FILL — Heparin Sodium (Porcine) Inj 1000 Unit/ML: INTRAMUSCULAR | Qty: 30 | Status: AC

## 2016-12-10 SURGICAL SUPPLY — 58 items
ADH SKN CLS APL DERMABOND .7 (GAUZE/BANDAGES/DRESSINGS) ×1
BAG DECANTER FOR FLEXI CONT (MISCELLANEOUS) ×2 IMPLANT
BASKET BONE COLLECTION (BASKET) ×2 IMPLANT
BONE EQUIVA 5CC (Bone Implant) ×2 IMPLANT
BUR MATCHSTICK NEURO 3.0 LAGG (BURR) ×2 IMPLANT
CANISTER SUCT 3000ML PPV (MISCELLANEOUS) ×2 IMPLANT
CONT SPEC 4OZ CLIKSEAL STRL BL (MISCELLANEOUS) ×2 IMPLANT
COVER BACK TABLE 60X90IN (DRAPES) ×2 IMPLANT
DERMABOND ADVANCED (GAUZE/BANDAGES/DRESSINGS) ×1
DERMABOND ADVANCED .7 DNX12 (GAUZE/BANDAGES/DRESSINGS) ×1 IMPLANT
DEVICE DISSECT PLASMABLAD 3.0S (MISCELLANEOUS) ×1 IMPLANT
DRAPE C-ARM 42X72 X-RAY (DRAPES) ×4 IMPLANT
DRAPE HALF SHEET 40X57 (DRAPES) IMPLANT
DRAPE LAPAROTOMY 100X72X124 (DRAPES) ×2 IMPLANT
DRAPE POUCH INSTRU U-SHP 10X18 (DRAPES) ×2 IMPLANT
DRSG OPSITE POSTOP 4X6 (GAUZE/BANDAGES/DRESSINGS) ×1 IMPLANT
DURAPREP 26ML APPLICATOR (WOUND CARE) ×2 IMPLANT
ELECT REM PT RETURN 9FT ADLT (ELECTROSURGICAL) ×2
ELECTRODE REM PT RTRN 9FT ADLT (ELECTROSURGICAL) ×1 IMPLANT
GAUZE SPONGE 4X4 12PLY STRL (GAUZE/BANDAGES/DRESSINGS) ×1 IMPLANT
GAUZE SPONGE 4X4 16PLY XRAY LF (GAUZE/BANDAGES/DRESSINGS) ×1 IMPLANT
GLOVE BIOGEL PI IND STRL 7.5 (GLOVE) IMPLANT
GLOVE BIOGEL PI IND STRL 8 (GLOVE) IMPLANT
GLOVE BIOGEL PI IND STRL 8.5 (GLOVE) ×2 IMPLANT
GLOVE BIOGEL PI INDICATOR 7.5 (GLOVE) ×1
GLOVE BIOGEL PI INDICATOR 8 (GLOVE) ×1
GLOVE BIOGEL PI INDICATOR 8.5 (GLOVE) ×2
GLOVE ECLIPSE 7.5 STRL STRAW (GLOVE) ×4 IMPLANT
GLOVE ECLIPSE 8.5 STRL (GLOVE) ×4 IMPLANT
GOWN STRL REUS W/ TWL LRG LVL3 (GOWN DISPOSABLE) IMPLANT
GOWN STRL REUS W/ TWL XL LVL3 (GOWN DISPOSABLE) IMPLANT
GOWN STRL REUS W/TWL 2XL LVL3 (GOWN DISPOSABLE) ×5 IMPLANT
GOWN STRL REUS W/TWL LRG LVL3 (GOWN DISPOSABLE)
GOWN STRL REUS W/TWL XL LVL3 (GOWN DISPOSABLE)
HEMOSTAT POWDER KIT SURGIFOAM (HEMOSTASIS) ×1 IMPLANT
KIT BASIN OR (CUSTOM PROCEDURE TRAY) ×2 IMPLANT
KIT ROOM TURNOVER OR (KITS) ×2 IMPLANT
NEEDLE HYPO 22GX1.5 SAFETY (NEEDLE) ×2 IMPLANT
NS IRRIG 1000ML POUR BTL (IV SOLUTION) ×2 IMPLANT
PACK LAMINECTOMY NEURO (CUSTOM PROCEDURE TRAY) ×2 IMPLANT
PAD ARMBOARD 7.5X6 YLW CONV (MISCELLANEOUS) ×6 IMPLANT
PATTIES SURGICAL .5 X1 (DISPOSABLE) ×2 IMPLANT
PLASMABLADE 3.0S (MISCELLANEOUS) ×2
ROD TI ALLOY CVD VIT 40MM (Rod) ×2 IMPLANT
SCREW VITALITY PA 6.5X45MM (Screw) ×4 IMPLANT
SPACER ZYSTON STR 11X25X10X8 (Spacer) ×2 IMPLANT
SPONGE LAP 4X18 X RAY DECT (DISPOSABLE) IMPLANT
SPONGE SURGIFOAM ABS GEL 100 (HEMOSTASIS) ×2 IMPLANT
SUT VIC AB 1 CT1 18XBRD ANBCTR (SUTURE) ×1 IMPLANT
SUT VIC AB 1 CT1 8-18 (SUTURE) ×2
SUT VIC AB 2-0 CP2 18 (SUTURE) ×2 IMPLANT
SUT VIC AB 3-0 SH 8-18 (SUTURE) ×2 IMPLANT
SYR 3ML LL SCALE MARK (SYRINGE) ×8 IMPLANT
TOP CLOSURE TORQ LIMIT (Neuro Prosthesis/Implant) ×4 IMPLANT
TOWEL GREEN STERILE (TOWEL DISPOSABLE) ×2 IMPLANT
TOWEL GREEN STERILE FF (TOWEL DISPOSABLE) ×2 IMPLANT
TRAY FOLEY W/METER SILVER 16FR (SET/KITS/TRAYS/PACK) ×2 IMPLANT
WATER STERILE IRR 1000ML POUR (IV SOLUTION) ×2 IMPLANT

## 2016-12-10 NOTE — H&P (Signed)
CHIEF COMPLAINT: Pain in the lower back into both buttocks and lower extremities.  HISTORY OF PRESENT ILLNESS: Kathryn Woods is a 66 year old right handed individual, who tells me that she had surgery back in 2001 by Dr. Gerlene FeeKritzer, which included a microdiscectomy. She did well for a long period of time, but then in 2015 she returned with increasing back pain and episodes of pain that would radiate into the buttocks or the lower extremities. Occasionally it would be on the left, and occasionally it would be on the right. Ultimately, she had a repeat MRI in January of 2015 that demonstrated spondylosis and stenosis at L3-4 and L4-5, in addition to some high-grade degenerative changes at the disc space at L5-S1. At that time, an epidural injection was recommended, which she underwent and she notes that she had some modest relief with that injection. Pain, however, has continued to bother her and she is seen now for further evaluation of this process. She feels the strength in her legs is good, although her capacity to engage in activity is somewhat limited. She does note that, in general, she has experienced good health, takes no medication on a chronic basis, and up until last year was playing softball on a regular basis.  PAST MEDICAL HISTORY: Her general health has been excellent.  PAST SURGICAL HISTORY: The only surgery has been the microdiscectomy.  REVIEW OF SYSTEMS: Her systems review notes that she has some sinus issues, some leg pain while walking, back pain, leg pain, and occasional difficulty with starting or stopping her urinary stream. This was all noted on a 14-point review sheet.  PHYSICAL EXAM: I note that she stands straight and erect. She walks without antalgia and has good motor strength in the iliopsoas, quads, tibialis anterior and gastrocs. Her deep tendon reflexes are 2+ in the patellae and 2+ in the Achilles. Babinski's downgoing. Straight leg raising is -45 degrees in either  lower extremity. Patrick's maneuver is negative bilaterally also.  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 She has been tolerating symptoms, but now they seem to be worsening. Plan: Repeat Mri shows that the patient has severe stenosis at L3-L4. L4-L5 appears well preserved. She is admitted now to undergo surgical decompression at L3-L4 with posterior lumbar interbody fusion and fixation L3-L4.

## 2016-12-10 NOTE — Anesthesia Postprocedure Evaluation (Signed)
Anesthesia Post Note  Patient: Kathryn Woods  Procedure(s) Performed: Procedure(s) (LRB): Lumbar Three-Four Posterior Lumbar Interbody Fusion (N/A)     Patient location during evaluation: PACU Anesthesia Type: General Level of consciousness: awake and sedated Pain management: pain level controlled (difficultt to control pain) Vital Signs Assessment: post-procedure vital signs reviewed and stable Respiratory status: spontaneous breathing, nonlabored ventilation, respiratory function stable and patient connected to nasal cannula oxygen Cardiovascular status: blood pressure returned to baseline and stable Postop Assessment: no signs of nausea or vomiting Anesthetic complications: no    Last Vitals:  Vitals:   12/10/16 1220 12/10/16 1241  BP: 125/78 134/86  Pulse: 77 79  Resp: 12 16  Temp: 36.5 C 36.6 C  SpO2: 93% 93%    Last Pain:  Vitals:   12/10/16 1410  TempSrc:   PainSc: 10-Worst pain ever                 Ezekiel Menzer,JAMES TERRILL

## 2016-12-10 NOTE — Transfer of Care (Signed)
Immediate Anesthesia Transfer of Care Note  Patient: Kathryn Woods  Procedure(s) Performed: Procedure(s) with comments: Lumbar Three-Four Posterior Lumbar Interbody Fusion (N/A) - L3-4 Posterior lumbar interbody fusion  Patient Location: PACU  Anesthesia Type:General  Level of Consciousness: awake, alert , oriented and sedated  Airway & Oxygen Therapy: Patient Spontanous Breathing and Patient connected to nasal cannula oxygen  Post-op Assessment: Report given to RN, Post -op Vital signs reviewed and stable and Patient moving all extremities  Post vital signs: Reviewed and stable  Last Vitals:  Vitals:   12/10/16 0636 12/10/16 1113  BP: (!) 164/99 123/82  Pulse: 75 85  Resp: 18 12  Temp: 36.6 C (!) 36.4 C  SpO2: 100% 97%    Last Pain:  Vitals:   12/10/16 1113  TempSrc:   PainSc: (P) Asleep      Patients Stated Pain Goal: 2 (12/10/16 0645)  Complications: No apparent anesthesia complications

## 2016-12-10 NOTE — Op Note (Signed)
Procedure: L3-4 decompressive laminectomy decompression of L3 and L4 nerve roots, posterior lumbar interbody arthrodesis with peek spacers local autograft and allograft, pedicle screw fixation L3-4, posterior lateral arthrodesis L3-4  Surgeon: Barnett AbuHenry Cortlin Marano M.D.  Asst.: Cherrie DistanceBenjamin ditty M.D.  Indications: Patient is Kathryn Woods is a 66 y.o. female who who's had significant back pain and lumbar radiculopathy for over a years period time. A lumbar MRI demonstrates advanced spondylolisthesis with high-grade canal stenosis. she was advised regarding surgical intervention.  Procedure: The patient was brought to the operating room supine on a stretcher. After the smooth induction of general endotracheal anesthesia she was turned prone and the back was prepped with alcohol and DuraPrep. The back was then draped sterilely. A midline incision was created and carried down to the lumbar dorsal fascia. A localizing radiograph identified the L3 and L4 spinous processes. A subligamentous dissection was created at L4 and L5 to expose the interlaminar space at L4 and L3 and the facet joints over the L3-4 interspace. Laminotomies were were then created removing the entire inferior margin of the lamina of L3 including the inferior facet at the L3-4joint. The yellow ligament was taken up and the common dural tube was exposed along with the L3 nerve root superiorly, and the L4 nerve root inferiorly, the disc space was exposed and epidural veins in this region were cauterized and divided. The L4 nerve roots and the L3 nerve root were dissected with care taken to protect them. The disc space was opened and a combination of curettes and rongeurs was used to evacuate the disc space fully. The endplates were removed using sharp curettes. An interbody spacer was placed to distract the disc space while the contralateral discectomy was performed. When the entirety of the disc was removed and the endplates were prepared final  sizing of the disc space was obtained 11 mm peek spacerswith a degrees of lordosis and 25 mm of length were chosen and packed with autograft and allograft and placed into the interspace. The remainder of the interspace was packed with autograft and allograft. Pedicle entry sites were then chosen using fluoroscopic guidance and 6.5 x 45 mm screws were placed in L3 and 6.5 x 45 mm screws were placed in L4. The lateral gutters were decorticated and graft was packed in the posterolateral gutters between L4 and L5. Final radiographs were obtained after placing appropriately sized rods between the pedicle screws at L4-L5 and torquing these to the appropriate tension. The surgical site was inspected carefully to assure the L4 and L5 nerve roots were well decompressed, hemostasis was obtained, and the graft was well packed. Then the retractors were removed and the wound was closed with #1 Vicryl in the lumbar dorsal fascia 2-0 Vicryl in the subcutaneous tissue and 3-0 Vicryl subcuticularly. When he cc of half percent Marcaine was injected into the paraspinous musculature at the time of closure. Blood loss was estimated at 250 cc. The patient tolerated procedure well and was returned to the recovery room in stable condition.

## 2016-12-10 NOTE — Anesthesia Procedure Notes (Signed)
Procedure Name: Intubation Date/Time: 12/10/2016 7:42 AM Performed by: Scheryl Darter Pre-anesthesia Checklist: Patient identified, Emergency Drugs available, Suction available and Patient being monitored Patient Re-evaluated:Patient Re-evaluated prior to induction Oxygen Delivery Method: Circle System Utilized Preoxygenation: Pre-oxygenation with 100% oxygen Induction Type: IV induction Ventilation: Mask ventilation without difficulty Laryngoscope Size: Mac and 3 Grade View: Grade I Tube type: Oral Tube size: 7.5 mm Number of attempts: 1 Airway Equipment and Method: Stylet and Oral airway Placement Confirmation: ETT inserted through vocal cords under direct vision,  positive ETCO2 and breath sounds checked- equal and bilateral Secured at: 21 cm Tube secured with: Tape Dental Injury: Teeth and Oropharynx as per pre-operative assessment

## 2016-12-10 NOTE — Evaluation (Signed)
Physical Therapy Evaluation Patient Details Name: Kathryn Woods MRN: [id number] DOB: 10/23/1950 Today's Date: 12/10/2016   History of Present Illness  Pt is a 66 y/o female s/p L3-4 PLIF. Pt with no significant PMH.   Clinical Impression  Patient is s/p above surgery resulting in the deficits listed below (see PT Problem List). PTA, pt was independent with functional mobility. Upon eval, pt limited by pain, weakness, and decreased balance. Pt requiring min guard to min A for mobility this session. Reports brother will be able to assist as needed upon d/c. Has all necessary DME at home. Patient will benefit from skilled PT to increase their independence and safety with mobility (while adhering to their precautions) to allow discharge to the venue listed below.     Follow Up Recommendations No PT follow up;Supervision for mobility/OOB    Equipment Recommendations  None recommended by PT (has necessary DMe )    Recommendations for Other Services       Precautions / Restrictions Precautions Precautions: Back Precaution Booklet Issued: Yes (comment) Precaution Comments: Reviewed back precaution handout with pt.  Required Braces or Orthoses: Spinal Brace Spinal Brace: Lumbar corset;Applied in sitting position Restrictions Weight Bearing Restrictions: No      Mobility  Bed Mobility Overal bed mobility: Needs Assistance Bed Mobility: Sidelying to Sit;Sit to Sidelying   Sidelying to sit: Min guard     Sit to sidelying: Min guard General bed mobility comments: Min guard to ensure maintenance of precautions. Pt requesting to stay in sidelying at end of session   Transfers Overall transfer level: Needs assistance Equipment used: None Transfers: Sit to/from Stand Sit to Stand: Min assist         General transfer comment: Min A for steadying assist.   Ambulation/Gait Ambulation/Gait assistance: Min guard Ambulation Distance (Feet): 150 Feet Assistive device:  (IV pole  ) Gait Pattern/deviations: Step-through pattern;Decreased stride length;Trunk flexed Gait velocity: Decreased Gait velocity interpretation: Below normal speed for age/gender General Gait Details: Slow, very cautious gait. Use of IV pole and min guard for steadying assist. Verbal cues for upright posture.   Stairs            Wheelchair Mobility    Modified Rankin (Stroke Patients Only)       Balance Overall balance assessment: Needs assistance Sitting-balance support: No upper extremity supported;Feet supported Sitting balance-[name]: Good     Standing balance support: Single extremity supported;During functional activity;No upper extremity supported Standing balance-[name]: Fair Standing balance comment: Able to maintain static standing without UE support                              Pertinent Vitals/Pain Pain Assessment: 0-10 Pain Score: 10-Worst pain ever Pain Location: back  Pain Descriptors / Indicators: Aching;Operative site guarding;Sore Pain Intervention(s): Limited activity within patient's tolerance;Premedicated before session;Monitored during session;Repositioned    Home Living Family/patient expects to be discharged to:: Private residence Living Arrangements: Parent;Other relatives (brother ) Available Help at Discharge: Family;Available 24 hours/day Type of Home: House Home Access: Stairs to enter Entrance Stairs-Rails: None Entrance Stairs-Number of Steps: 1 (threshold step ) Home Layout: One level Home Equipment: Walker - 2 wheels;Cane - single point;Bedside commode;Shower seat      Prior Function Level of Independence: Independent               Hand Dominance        Extremity/Trunk Assessment   Upper Extremity Assessment Upper  Extremity Assessment: Defer to OT evaluation    Lower Extremity Assessment Lower Extremity Assessment: Generalized weakness    Cervical / Trunk Assessment Cervical / Trunk Assessment:  Other exceptions Cervical / Trunk Exceptions: s/p PLIF   Communication   Communication: No difficulties  Cognition Arousal/Alertness: Awake/alert Behavior During Therapy: WFL for tasks assessed/performed Overall Cognitive Status: Within Functional Limits for tasks assessed                                        General Comments General comments (skin integrity, edema, etc.): Pt's daughter present during session. Educated about generalized walking program to perform at home     Exercises     Assessment/Plan    PT Assessment Patient needs continued PT services  PT Problem List Decreased strength;Decreased range of motion;Decreased balance;Decreased mobility;Decreased knowledge of precautions;Pain       PT Treatment Interventions DME instruction;Gait training;Stair training;Therapeutic activities;Functional mobility training;Balance training;Therapeutic exercise;Neuromuscular re-education;Patient/family education    PT Goals (Current goals can be found in the Care Plan section)  Acute Rehab PT Goals Patient Stated Goal: to decrease pain  PT Goal Formulation: With patient Time For Goal Achievement: 12/17/16 Potential to Achieve Goals: Good    Frequency Min 5X/week   Barriers to discharge        Co-evaluation               AM-PAC PT "6 Clicks" Daily Activity  Outcome Measure Difficulty turning over in bed (including adjusting bedclothes, sheets and blankets)?: A Little Difficulty moving from lying on back to sitting on the side of the bed? : A Little Difficulty sitting down on and standing up from a chair with arms (e.g., wheelchair, bedside commode, etc,.)?: Unable Help needed moving to and from a bed to chair (including a wheelchair)?: A Little Help needed walking in hospital room?: A Little Help needed climbing 3-5 steps with a railing? : A Little 6 Click Score: 16    End of Session Equipment Utilized During Treatment: Gait belt;Back  brace Activity Tolerance: Patient tolerated treatment well Patient left: in bed;with call bell/phone within reach;with family/visitor present Nurse Communication: Mobility status PT Visit Diagnosis: Other abnormalities of gait and mobility (R26.89);Pain Pain - part of body:  (back )    Time: 1420-1443 PT Time Calculation (min) (ACUTE ONLY): 23 min   Charges:   PT Evaluation $PT Eval Low Complexity: 1 Low PT Treatments $Gait Training: 8-22 mins   PT G Codes:        Gladys Damme, PT, DPT  Acute Rehabilitation Services  Pager: [phone]   Lehman Prom 12/10/2016, 3:48 PM

## 2016-12-11 MED ORDER — DEXAMETHASONE 1 MG PO TABS: ORAL_TABLET | ORAL | 0 refills | Status: DC

## 2016-12-11 MED ORDER — DEXAMETHASONE 4 MG PO TABS
2.0000 mg | ORAL_TABLET | Freq: Two times a day (BID) | ORAL | Status: DC
  Administered 2016-12-11 – 2016-12-12 (×3): 2 mg via ORAL
  Filled 2016-12-11 (×3): qty 1

## 2016-12-11 MED ORDER — BACLOFEN 5 MG PO TABS: 10.0000 mg | ORAL_TABLET | Freq: Four times a day (QID) | ORAL | 3 refills | Status: DC | PRN

## 2016-12-11 MED ORDER — MAGNESIUM CITRATE PO SOLN
1.0000 | Freq: Once | ORAL | Status: AC
  Administered 2016-12-11: 1 via ORAL
  Filled 2016-12-11: qty 296

## 2016-12-11 MED ORDER — HYDROCODONE-ACETAMINOPHEN 5-325 MG PO TABS: 1.0000 | ORAL_TABLET | ORAL | 0 refills | Status: DC | PRN

## 2016-12-11 NOTE — Evaluation (Signed)
Occupational Therapy Evaluation Patient Details Name: Kathryn Putnamatricia Oswald MRN: [id number] DOB: 09/01/1950 Today's Date: 12/11/2016    History of Present Illness Pt is a 66 y/o female s/p L3-4 PLIF. Pt with no significant PMH.    Clinical Impression   PTA, pt was independent with ADL and functional mobility. She currently requires min assist for dressing tasks and min guard assist for toilet transfers. Initiated education concerning compensatory strategies for ADL to adhere to back precautions. Additionally introduced AE for LB ADL and pt reports she is considering pursuing this. Pt has been caring for her mother along with assistance from her siblings. She would benefit from continued OT services while admitted to maximize independence and safety with ADL participation in preparation for return home with assistance from family. Will continue to follow.     Follow Up Recommendations  No OT follow up;Supervision/Assistance - 24 hour    Equipment Recommendations  None recommended by OT    Recommendations for Other Services       Precautions / Restrictions Precautions Precautions: Back Precaution Booklet Issued: Yes (comment) Precaution Comments: Educated pt concerning back precautions during ADL. Required Braces or Orthoses: Spinal Brace Spinal Brace: Lumbar corset;Applied in sitting position Restrictions Weight Bearing Restrictions: No      Mobility Bed Mobility Overal bed mobility: Needs Assistance Bed Mobility: Rolling;Sidelying to Sit;Sit to Sidelying Rolling: Supervision Sidelying to sit: Supervision     Sit to sidelying: Supervision General bed mobility comments: supervision for safety, demonstrated good log roll technique  Transfers Overall transfer level: Needs assistance Equipment used: None Transfers: Sit to/from Stand Sit to Stand: Min guard         General transfer comment: increased time, min guard for safety    Balance Overall balance assessment: Needs  assistance Sitting-balance support: No upper extremity supported;Feet supported Sitting balance-[name]: Good     Standing balance support: During functional activity;No upper extremity supported Standing balance-[name]: Fair Standing balance comment: Able to maintain static standing without UE support                            ADL either performed or assessed with clinical judgement   ADL Overall ADL's : Needs assistance/impaired Eating/Feeding: Set up;Sitting   Grooming: Standing;Min guard   Upper Body Bathing: Sitting;Supervision/ safety   Lower Body Bathing: With adaptive equipment;Sit to/from stand;Min guard   Upper Body Dressing : Minimal assistance;Sitting   Lower Body Dressing: Minimal assistance;With adaptive equipment;Sit to/from stand   Toilet Transfer: Min guard;Ambulation   Toileting- Clothing Manipulation and Hygiene: Min guard;Sit to/from stand   Tub/ Shower Transfer: Min guard;Ambulation;Walk-in shower   Functional mobility during ADLs: Min guard General ADL Comments: Pt educated concerning use of AE for LB ADL as well as compensatory strategies for dressing, bathing, and grooming tasks.     Vision Patient Visual Report: No change from baseline Vision Assessment?: No apparent visual deficits     Perception     Praxis      Pertinent Vitals/Pain Pain Assessment: Faces Faces Pain Scale: Hurts even more Pain Location: back  Pain Descriptors / Indicators: Aching;Operative site guarding;Sore Pain Intervention(s): Limited activity within patient's tolerance;Monitored during session;Repositioned     Hand Dominance     Extremity/Trunk Assessment Upper Extremity Assessment Upper Extremity Assessment: Overall WFL for tasks assessed   Lower Extremity Assessment Lower Extremity Assessment: Generalized weakness   Cervical / Trunk Assessment Cervical / Trunk Assessment: Other exceptions Cervical / Trunk Exceptions:  s/p PLIF     Communication Communication Communication: No difficulties   Cognition Arousal/Alertness: Awake/alert Behavior During Therapy: WFL for tasks assessed/performed Overall Cognitive Status: Within Functional Limits for tasks assessed                                     General Comments       Exercises     Shoulder Instructions      Home Living Family/patient expects to be discharged to:: Private residence Living Arrangements: Parent;Other relatives (brother) Available Help at Discharge: Family;Available 24 hours/day Type of Home: House Home Access: Stairs to enter [employer] of Steps: 1 (threshold step) Entrance Stairs-Rails: None Home Layout: One level     Bathroom Shower/Tub: Producer, television/film/video: Handicapped height     Home Equipment: Environmental consultant - 2 wheels;Cane - single point;Bedside commode;Shower seat          Prior Functioning/Environment Level of Independence: Independent                 OT Problem List: Decreased strength;Decreased activity tolerance;Impaired balance (sitting and/or standing);Decreased safety awareness;Decreased knowledge of use of DME or AE;Decreased knowledge of precautions;Pain      OT Treatment/Interventions: Self-care/ADL training;Therapeutic exercise;Energy conservation;DME and/or AE instruction;Therapeutic activities;Patient/family education;Balance training    OT Goals(Current goals can be found in the care plan section) Acute Rehab OT Goals Patient Stated Goal: to decrease pain  OT Goal Formulation: With patient Time For Goal Achievement: 12/25/16 Potential to Achieve Goals: Good ADL Goals Pt Will Perform Upper Body Dressing: with modified independence;sitting (including brace) Pt Will Perform Lower Body Dressing: with modified independence;sit to/from stand;with adaptive equipment Pt Will Transfer to Toilet: with modified independence;bedside commode;ambulating (BSC over toilet) Pt Will  Perform Toileting - Clothing Manipulation and hygiene: with modified independence;sit to/from stand  OT Frequency: Min 2X/week   Barriers to D/C:            Co-evaluation              AM-PAC PT "6 Clicks" Daily Activity     Outcome Measure Help from another person eating meals?: None Help from another person taking care of personal grooming?: A Little Help from another person toileting, which includes using toliet, bedpan, or urinal?: A Little Help from another person bathing (including washing, rinsing, drying)?: A Little Help from another person to put on and taking off regular upper body clothing?: A Little Help from another person to put on and taking off regular lower body clothing?: A Little 6 Click Score: 19   End of Session Nurse Communication: Mobility status  Activity Tolerance: Patient tolerated treatment well Patient left: in bed;with call bell/phone within reach  OT Visit Diagnosis: Unsteadiness on feet (R26.81);Other abnormalities of gait and mobility (R26.89)                Time: 6962-9528 OT Time Calculation (min): 18 min Charges:  OT General Charges $OT Visit: 1 Visit OT Evaluation $OT Eval Moderate Complexity: 1 Mod G-Codes:     Doristine Section, MS OTR/L  Pager: [phone]   Adelene Polivka A Arleene Settle 12/11/2016, 11:49 AM

## 2016-12-11 NOTE — Progress Notes (Signed)
Signs are stable Motor function appears to be doing quite well Patient a appears to be comfortable Dressing is clean and dry Plan discharge for 2 more morning We'll use slow Decadron taper

## 2016-12-11 NOTE — Progress Notes (Signed)
Physical Therapy Treatment Patient Details Name: Kathryn Woods MRN: [id number] DOB: 08/27/1950 Today's Date: 12/11/2016    History of Present Illness Pt is a 66 y/o female s/p L3-4 PLIF. Pt with no significant PMH.     PT Comments    Pt making good progress towards achieving her current functional mobility goals. PT will continue to follow acutely to ensure a safe d/c home.   Follow Up Recommendations  No PT follow up;Supervision for mobility/OOB     Equipment Recommendations  None recommended by PT    Recommendations for Other Services       Precautions / Restrictions Precautions Precautions: Back Precaution Booklet Issued: Yes (comment) Precaution Comments: Reviewed 3/3 back precautions with pt throughout Required Braces or Orthoses: Spinal Brace Spinal Brace: Lumbar corset;Applied in sitting position Restrictions Weight Bearing Restrictions: No    Mobility  Bed Mobility Overal bed mobility: Needs Assistance Bed Mobility: Rolling;Sidelying to Sit;Sit to Sidelying Rolling: Supervision Sidelying to sit: Supervision     Sit to sidelying: Supervision General bed mobility comments: supervision for safety, demonstrated good log roll technique  Transfers Overall transfer level: Needs assistance Equipment used: None Transfers: Sit to/from Stand Sit to Stand: Min guard         General transfer comment: increased time, min guard for safety  Ambulation/Gait Ambulation/Gait assistance: Min guard Ambulation Distance (Feet): 200 Feet Assistive device: None Gait Pattern/deviations: Step-through pattern;Decreased stride length Gait velocity: Decreased Gait velocity interpretation: Below normal speed for age/gender General Gait Details: slow, cautious gait with mild instability but no overt LOB or need for physical assistance, min guard for safety   Stairs            Wheelchair Mobility    Modified Rankin (Stroke Patients Only)       Balance Overall  balance assessment: Needs assistance Sitting-balance support: No upper extremity supported;Feet supported Sitting balance-[name]: Good     Standing balance support: During functional activity;No upper extremity supported Standing balance-[name]: Fair                              Cognition Arousal/Alertness: Awake/alert Behavior During Therapy: WFL for tasks assessed/performed Overall Cognitive Status: Within Functional Limits for tasks assessed                                        Exercises      General Comments        Pertinent Vitals/Pain Pain Assessment: Faces Faces Pain Scale: Hurts little more Pain Location: back  Pain Descriptors / Indicators: Aching;Operative site guarding;Sore Pain Intervention(s): Monitored during session;Repositioned    Home Living                      Prior Function            PT Goals (current goals can now be found in the care plan section) Acute Rehab PT Goals PT Goal Formulation: With patient Time For Goal Achievement: 12/17/16 Potential to Achieve Goals: Good Progress towards PT goals: Progressing toward goals    Frequency    Min 5X/week      PT Plan Current plan remains appropriate    Co-evaluation              AM-PAC PT "6 Clicks" Daily Activity  Outcome Measure  Difficulty turning over in bed (  including adjusting bedclothes, sheets and blankets)?: A Little Difficulty moving from lying on back to sitting on the side of the bed? : A Little Difficulty sitting down on and standing up from a chair with arms (e.g., wheelchair, bedside commode, etc,.)?: A Little Help needed moving to and from a bed to chair (including a wheelchair)?: A Little Help needed walking in hospital room?: A Little Help needed climbing 3-5 steps with a railing? : A Little 6 Click Score: 18    End of Session Equipment Utilized During Treatment: Gait belt;Back brace Activity Tolerance: Patient  tolerated treatment well Patient left: in bed;with call bell/phone within reach Nurse Communication: Mobility status PT Visit Diagnosis: Other abnormalities of gait and mobility (R26.89);Pain Pain - part of body:  (back)     Time: 1610-9604 PT Time Calculation (min) (ACUTE ONLY): 13 min  Charges:  $Gait Training: 8-22 mins                    G Codes:       [place], [place], [place] [phone]    Alessandra Bevels Raelynne Ludwick 12/11/2016, 11:09 AM

## 2016-12-11 NOTE — Discharge Summary (Signed)
Date of admission: 12/10/2016 Date of discharge: 12/12/2016 Admitting diagnosis: Spondylolisthesis with stenosis L3-L4, neurogenic claudication, lumbar radiculopathy Discharge and final diagnosis: Spondylolisthesis with stenosis L3-L4, neurogenic claudication, lumbar radiculopathy. Condition on discharge: Improved Hospital course: Patient was admitted to undergo surgical decompression and arthrodesis at L3-L4. She tolerated surgery well. She is ambulatory. Her incision is clean and dry. She is discharged home.

## 2016-12-12 NOTE — Progress Notes (Signed)
Physical Therapy Treatment Patient Details Name: Kathryn Woods MRN: [id number] DOB: 09/18/1950 Today's Date: 12/12/2016    History of Present Illness Pt is a 66 y/o female s/p L3-4 PLIF. Pt with no significant PMH.     PT Comments    Pt making steady progress with mobility. PT will continue to follow acutely to ensure a safe d/c home.   Follow Up Recommendations  No PT follow up;Supervision for mobility/OOB     Equipment Recommendations  None recommended by PT    Recommendations for Other Services       Precautions / Restrictions Precautions Precautions: Back Precaution Booklet Issued: Yes (comment) Precaution Comments: Pt able to recall 3/3 back precautions Required Braces or Orthoses: Spinal Brace Spinal Brace: Lumbar corset;Applied in sitting position Restrictions Weight Bearing Restrictions: No    Mobility  Bed Mobility Overal bed mobility: Needs Assistance Bed Mobility: Rolling;Sidelying to Sit Rolling: Supervision Sidelying to sit: Supervision       General bed mobility comments: HOB flat, no bed rails to simulate home set up  Transfers Overall transfer level: Needs assistance Equipment used: None Transfers: Sit to/from Stand Sit to Stand: Min guard         General transfer comment: increased time, min guard for safety  Ambulation/Gait Ambulation/Gait assistance: Supervision Ambulation Distance (Feet): 350 Feet Assistive device: None Gait Pattern/deviations: Step-through pattern;Decreased stride length Gait velocity: Decreased Gait velocity interpretation: Below normal speed for age/gender General Gait Details: slow, cautious gait with mild instability but no overt LOB or need for physical assistance, supervision for safety   Stairs            Wheelchair Mobility    Modified Rankin (Stroke Patients Only)       Balance Overall balance assessment: Needs assistance Sitting-balance support: No upper extremity supported;Feet  supported Sitting balance-[name]: Good Sitting balance - Comments: Pt able to sit EOB and use sock aide to don socks with no LOB    Standing balance support: During functional activity;No upper extremity supported Standing balance-[name]: Fair                              Cognition Arousal/Alertness: Awake/alert Behavior During Therapy: WFL for tasks assessed/performed Overall Cognitive Status: Within Functional Limits for tasks assessed                                        Exercises      General Comments General comments (skin integrity, edema, etc.): Pt interested in purchasing AE for LB ADLs.      Pertinent Vitals/Pain Pain Assessment: Faces Pain Score: 3  Faces Pain Scale: No hurt Pain Location: back  Pain Descriptors / Indicators: Aching;Sore;Operative site guarding Pain Intervention(s): Limited activity within patient's tolerance;Premedicated before session;Monitored during session    Home Living                      Prior Function            PT Goals (current goals can now be found in the care plan section) Acute Rehab PT Goals Patient Stated Goal: To go home  PT Goal Formulation: With patient Time For Goal Achievement: 12/17/16 Potential to Achieve Goals: Good Progress towards PT goals: Progressing toward goals    Frequency    Min 5X/week      PT  Plan Current plan remains appropriate    Co-evaluation              AM-PAC PT "6 Clicks" Daily Activity  Outcome Measure  Difficulty turning over in bed (including adjusting bedclothes, sheets and blankets)?: None Difficulty moving from lying on back to sitting on the side of the bed? : A Little Difficulty sitting down on and standing up from a chair with arms (e.g., wheelchair, bedside commode, etc,.)?: A Little Help needed moving to and from a bed to chair (including a wheelchair)?: None Help needed walking in hospital room?: None Help needed  climbing 3-5 steps with a railing? : A Little 6 Click Score: 21    End of Session Equipment Utilized During Treatment: Gait belt;Back brace Activity Tolerance: Patient tolerated treatment well Patient left: with call bell/phone within reach;in chair Nurse Communication: Mobility status PT Visit Diagnosis: Other abnormalities of gait and mobility (R26.89);Pain Pain - part of body:  (back)     Time: [id number] PT Time Calculation (min) (ACUTE ONLY): 10 min  Charges:  $Gait Training: 8-22 mins                    G Codes:       [place] PrairieJennifer Ouit[place], [place]PT, [place]PT [phone]    Kathryn BevelsJennifer Woods Amandalee Lacap 12/12/2016, 9:33 AM

## 2016-12-12 NOTE — Progress Notes (Signed)
Patient alert and oriented, mae's well, voiding adequate amount of urine, swallowing without difficulty, no c/o pain at time of discharge. Patient discharged home with family. Script and discharged instructions given to patient. Patient and family stated understanding of instructions given. Patient has an appointment with Dr. Elsner  

## 2016-12-12 NOTE — Therapy (Signed)
Occupational Therapy Treatment Patient Details Name: Kathryn Woods MRN: [id number] DOB: [birth year] Today's Date: 12/12/2016    History of present illness Pt is a 66 y/o female s/p L3-4 PLIF. Pt with no significant PMH.    OT comments  Pt educated on use of AE for LB ADLs. Pt completed shower transfer with min guard for safety and continues to require min guard for functional mobility. Will continue to follow acutely to address established goals.    Follow Up Recommendations  No OT follow up;Supervision/Assistance - 24 hour    Equipment Recommendations  None recommended by OT    Recommendations for Other Services      Precautions / Restrictions Precautions Precautions: Back Precaution Booklet Issued: Yes (comment) Precaution Comments: Pt able to recall 3/3 back precautions Required Braces or Orthoses: Spinal Brace Spinal Brace: Lumbar corset;Applied in sitting position Restrictions Weight Bearing Restrictions: No       Mobility Bed Mobility               General bed mobility comments: Pt sitting in chair upon OT arrival.  Transfers Overall transfer level: Needs assistance Equipment used: None Transfers: Sit to/from Stand Sit to Stand: Min guard         General transfer comment: increased time, min guard for safety    Balance Overall balance assessment: Needs assistance Sitting-balance support: No upper extremity supported;Feet supported Sitting balance-[name]: Good Sitting balance - Comments: Pt able to sit EOB and use sock aide to don socks with no LOB    Standing balance support: During functional activity;No upper extremity supported Standing balance-[name]: Fair                             ADL either performed or assessed with clinical judgement   ADL Overall ADL's : Needs assistance/impaired             Lower Body Bathing: Min guard;With adaptive equipment;Sit to/from stand Lower Body Bathing Details (indicate cue  type and reason): Pt educatd on use of long handled sponge to complete LB bathing.     Lower Body Dressing: Min guard;With adaptive equipment;Sitting/lateral leans Lower Body Dressing Details (indicate cue type and reason): Pt able to don socks using sock aide.          Tub/ Shower Transfer: Min guard;Ambulation;Walk-in shower;Grab bars Tub/Shower Transfer Details (indicate cue type and reason): Pt able to transfer in/out of shower with min guard for safety and use of grab bars to boost up.  Functional mobility during ADLs: Min guard General ADL Comments: Pt educated on AE to increase independence in ADLs. Completed shower transfer.      Vision   Vision Assessment?: No apparent visual deficits   Perception     Praxis      Cognition Arousal/Alertness: Awake/alert Behavior During Therapy: WFL for tasks assessed/performed Overall Cognitive Status: Within Functional Limits for tasks assessed                                          Exercises     Shoulder Instructions       General Comments Pt interested in purchasing AE for LB ADLs.    Pertinent Vitals/ Pain       Pain Assessment: 0-10 Pain Score: 3  Pain Location: back  Pain Descriptors / Indicators: Aching;Sore;Operative site guarding Pain  Intervention(s): Limited activity within patient's tolerance;Premedicated before session;Monitored during session  Home Living                                          Prior Functioning/Environment              Frequency  Min 2X/week        Progress Toward Goals  OT Goals(current goals can now be found in the care plan section)  Progress towards OT goals: Progressing toward goals  Acute Rehab OT Goals Patient Stated Goal: To go home  OT Goal Formulation: With patient Time For Goal Achievement: 12/25/16 Potential to Achieve Goals: Good  Plan Discharge plan remains appropriate    Co-evaluation                 AM-PAC PT  "6 Clicks" Daily Activity     Outcome Measure   Help from another person eating meals?: None Help from another person taking care of personal grooming?: None Help from another person toileting, which includes using toliet, bedpan, or urinal?: A Little Help from another person bathing (including washing, rinsing, drying)?: A Little Help from another person to put on and taking off regular upper body clothing?: None Help from another person to put on and taking off regular lower body clothing?: A Little 6 Click Score: 21    End of Session Equipment Utilized During Treatment: Gait belt  OT Visit Diagnosis: Unsteadiness on feet (R26.81);Other abnormalities of gait and mobility (R26.89)   Activity Tolerance Patient tolerated treatment well   Patient Left in chair;with call bell/phone within reach   Nurse Communication Mobility status        Time: [id number] OT Time Calculation (min): 13 min  Charges: OT General Charges $OT Visit: 1 Visit OT Treatments $Self Care/Home Management : 8-22 mins  Cammy Copaourtney Maysen Bonsignore, [place]OTS #[phone]    Cammy Copaourtney Elston Aldape 12/12/2016, 9:24 AM

## 2018-01-04 ENCOUNTER — Emergency Department (HOSPITAL_COMMUNITY): Payer: Medicare PPO

## 2018-01-04 ENCOUNTER — Encounter (HOSPITAL_COMMUNITY): Payer: Self-pay | Admitting: Radiology

## 2018-01-04 ENCOUNTER — Emergency Department (HOSPITAL_COMMUNITY)
Admission: EM | Admit: 2018-01-04 | Discharge: 2018-01-04 | Disposition: A | Discharge status: Home / Self Care | Payer: Medicare PPO | Attending: Emergency Medicine | Admitting: Emergency Medicine

## 2018-01-04 ENCOUNTER — Other Ambulatory Visit: Payer: Self-pay

## 2018-01-04 DIAGNOSIS — X58XXXA Exposure to other specified factors, initial encounter: Secondary | ICD-10-CM | POA: Insufficient documentation

## 2018-01-04 DIAGNOSIS — Y998 Other external cause status: Secondary | ICD-10-CM | POA: Diagnosis not present

## 2018-01-04 DIAGNOSIS — Z87891 Personal history of nicotine dependence: Secondary | ICD-10-CM | POA: Diagnosis not present

## 2018-01-04 DIAGNOSIS — S39012A Strain of muscle, fascia and tendon of lower back, initial encounter: Secondary | ICD-10-CM | POA: Diagnosis not present

## 2018-01-04 DIAGNOSIS — Y929 Unspecified place or not applicable: Secondary | ICD-10-CM | POA: Insufficient documentation

## 2018-01-04 DIAGNOSIS — Y9389 Activity, other specified: Secondary | ICD-10-CM | POA: Insufficient documentation

## 2018-01-04 DIAGNOSIS — S3992XA Unspecified injury of lower back, initial encounter: Secondary | ICD-10-CM | POA: Diagnosis present

## 2018-01-04 MED ORDER — IBUPROFEN 400 MG PO TABS
400.0000 mg | ORAL_TABLET | Freq: Once | ORAL | Status: AC
  Administered 2018-01-04: 400 mg via ORAL
  Filled 2018-01-04: qty 1

## 2018-01-04 MED ORDER — MELOXICAM 7.5 MG PO TABS: 7.5000 mg | ORAL_TABLET | Freq: Every day | ORAL | 0 refills | Status: AC

## 2018-01-04 MED ORDER — CYCLOBENZAPRINE HCL 5 MG PO TABS: 5.0000 mg | ORAL_TABLET | Freq: Three times a day (TID) | ORAL | 0 refills | Status: DC

## 2018-01-04 NOTE — Discharge Instructions (Addendum)
Thank you for allowing me to care for you today in the Emergency Department.   Take 1 tablet of meloxicam with food daily for pain.  Avoid ibuprofen, naproxen, Motrin, or Advil.  You can also take 650 mg of Tylenol every 6 hours or as prescribed on the bottle.  Take 1 tablet of Flexeril every 8 hours.  This is a muscle relaxer and can help with muscle pain and spasms.  Apply ice for 15 to 20 minutes up to 3-4 times a day.  Start to stretch as your pain allows.  I have included some exercise with your paperwork.  Return to the emergency department if you develop new or worsening symptoms including burning or discomfort when you pee, a high fever, if he start to pee or poop on yourself, have new numbness or weakness in your legs, develop vomiting, abdominal pain, or other new, concerning symptoms.

## 2018-01-04 NOTE — ED Triage Notes (Addendum)
Back pain in left lumbar region. Previous back surgery one year ago where hardware was installed. Denies incontinence. Tylenol last taken yesterday at 4pm without relief.

## 2018-01-04 NOTE — ED Provider Notes (Signed)
[facility] EMERGENCY DEPARTMENT Provider Note   CSN: [id number] Arrival date & time: 01/04/18  1257     History   Chief Complaint Chief Complaint  Patient presents with  . Back Pain    HPI Kathryn Woods is a 67 y.o. female with a history of spondylolisthesis at L3-L4 status post L3-L4 posterior lumbar interbody fusion who presents to the emergency department with a chief complaint of back pain.  The patient endorses left-sided low back pain that began 4 to 5 days ago.  She characterizes the pain as gnawing.  She states the pain is worse with walking and alleviated by nothing.  She has been treating her symptoms with Tylenol arthritis, last dose was yesterday at 4 PM with no relief.  No new exercises, falls, or trauma.  She denies urinary or fecal incontinence, saddle paresthesias, numbness, weakness, fever, chills, dysuria, hematuria, abdominal pain, nausea, vomiting, or diarrhea.  She states that she is concerned that some of the hardware is out of place because she feels as if her pain is "right over where 1 of the screws was placed".   The history is provided by the patient. No language interpreter was used.    Past Medical History:  Diagnosis Date  . Arthritis    lumbar region, spondylolisthesis     Patient Active Problem List   Diagnosis Date Noted  . Spondylolisthesis at L3-L4 level 12/10/2016  . Bursitis, shoulder 02/24/2013  . HYPERLIPIDEMIA 02/04/2007  . WRIST SPRAIN, LEFT 02/04/2007  . MIGRAINE HEADACHE 01/15/2007  . BRONCHITIS, CHRONIC NOS 01/15/2007  . CONSTIPATION 01/15/2007  . IBS 01/15/2007  . DISC DISEASE, LUMBOSACRAL SPINE 01/15/2007  . LOW BACK PAIN, CHRONIC 01/15/2007  . MALAISE AND FATIGUE 01/15/2007    Past Surgical History:  Procedure Laterality Date  . BACK SURGERY    . TUBAL LIGATION       OB History   None      Home Medications    Prior to Admission medications   Medication Sig Start Date End Date Taking? Authorizing Provider    Baclofen 5 MG TABS Take 10 mg by mouth 4 (four) times daily as needed. 12/11/16   Barnett Abu, MD  cyclobenzaprine (FLEXERIL) 5 MG tablet Take 1 tablet (5 mg total) by mouth 3 (three) times daily. 01/04/18   Lewi Drost A, PA-C  dexamethasone (DECADRON) 1 MG tablet 2 tablets twice daily for 2 days, one tablet twice daily for 2 days, one tablet daily for 2 days. 12/11/16   Barnett Abu, MD  HYDROcodone-acetaminophen (NORCO/VICODIN) 5-325 MG per tablet Take 1 tablet by mouth 2 (two) times daily as needed for moderate pain.     [provider]  HYDROcodone-acetaminophen (NORCO/VICODIN) 5-325 MG tablet Take 1-2 tablets by mouth every 4 (four) hours as needed (breakthrough pain). 12/11/16   Barnett Abu, MD  meloxicam (MOBIC) 7.5 MG tablet Take 1 tablet (7.5 mg total) by mouth daily for 7 days. 01/04/18 01/11/18  Charmane Protzman, Pedro Earls A, PA-C    Family History No family history on file.  Social History Social History   Tobacco Use  . Smoking status: Former Smoker    Last attempt to quit: 12/06/1982    Years since quitting: 35.1  . Smokeless tobacco: Never Used  Substance Use Topics  . Alcohol use: No  . Drug use: Yes    Types: Marijuana    Comment: a couple times- for pain relief, pt. informed to not use anymore before surgery      Allergies  Aspirin   Review of Systems Review of Systems  Constitutional: Negative for activity change and chills.  Respiratory: Negative for shortness of breath.   Cardiovascular: Negative for chest pain.  Gastrointestinal: Negative for abdominal pain, constipation, diarrhea, nausea and vomiting.  Genitourinary: Negative for dysuria, flank pain, pelvic pain, urgency and vaginal discharge.  Musculoskeletal: Positive for back pain.  Skin: Negative for rash.  Allergic/Immunologic: Negative for immunocompromised state.  Neurological: Negative for weakness, numbness and headaches.  Psychiatric/Behavioral: Negative for confusion.     Physical  Exam Updated Vital Signs Pulse 78   Temp 98.2 F (36.8 C) (Oral)   Resp 17   Ht 5\' 6"  (1.676 m)   Wt 76.7 kg   SpO2 98%   BMI 27.28 kg/m   Physical Exam  Constitutional: No distress.  HENT:  Head: Normocephalic.  Eyes: Conjunctivae are normal.  Neck: Neck supple.  Cardiovascular: Normal rate, regular rhythm, normal heart sounds and intact distal pulses. Exam reveals no gallop and no friction rub.  No murmur heard. Pulmonary/Chest: Effort normal. No stridor. No respiratory distress. She has no wheezes. She has no rales. She exhibits no tenderness.  Abdominal: Soft. Bowel sounds are normal. She exhibits no distension and no mass. There is no tenderness. There is no rebound and no guarding. No hernia.  Musculoskeletal: She exhibits tenderness. She exhibits no edema or deformity.  Tender to palpation to the musculature of the left lumbar spine.  Right lumbar musculature is nontender.  No tenderness to the bilateral SI joints. No tenderness to the cervical, thoracic, or lumbar spinous processes.  There is a well-healed midline scar to the lumbar spine.  No surrounding erythema, edema, or warmth.  No rashes.  5 out of 5 strength against resistance of the bilateral lower extremities with dorsiflexion plantarflexion.  Sensation is intact and equal throughout.  DP pulses are 2+ and symmetric.  Able to bear weight on the bilateral lower extremities.  Negative straight leg raise bilaterally.  Ambulatory without difficulty.  Neurological: She is alert.  Skin: Skin is warm. No rash noted.  Psychiatric: Her behavior is normal.  Nursing note and vitals reviewed.    ED Treatments / Results  Labs (all labs ordered are listed, but only abnormal results are displayed) Labs Reviewed - No data to display  EKG None  Radiology Dg Lumbar Spine Complete  Result Date: 01/04/2018 CLINICAL DATA:  Back pain without trauma. EXAM: LUMBAR SPINE - COMPLETE 4+ VIEW COMPARISON:  June 18, 2016 FINDINGS:  Pedicle rods and screws seen at L3 and L4. A disc spacer device is also identified in good position. No inter pedicular widening. No traumatic malalignment. Minimal anterior wedging of L1 was not seen in March of 2019. However, the patient reports no trauma in the finding could be positional. No hardware failure. No other abnormalities. IMPRESSION: 1. Minimal anterior wedging of L1 may be artifactual due to positioning. This was not seen in March of 2019. I suspect, even if real, it is nonacute. Recommend clinical correlation. 2. No hardware failure in the L3-4 surgical hardware. 3. No other abnormalities. Electronically Signed   By: Gerome Sam III M.D   On: 01/04/2018 15:50    Procedures Procedures (including critical care time)  Medications Ordered in ED Medications  ibuprofen (ADVIL,MOTRIN) tablet 400 mg (400 mg Oral Given 01/04/18 1523)     Initial Impression / Assessment and Plan / ED Course  I have reviewed the triage vital signs and the nursing notes.  Pertinent labs &  imaging results that were available during my care of the patient were reviewed by me and considered in my medical decision making (see chart for details).     67 year old female with a history of spondylolisthesis at L3-L4 status post L3-L4 posterior lumbar interbody fusion presenting with left-sided, atraumatic low back pain for 4-5 days.  Patient was discussed and evaluated by Dr. Juleen China, attending physician.  No neurological deficits and normal neuro exam.  Patient can walk but states it is painful.  No urinary or bowel incontinence.  No concern for cauda equina, infection, AAA, infection, or fracture.  Doubt UTI, pyelonephritis, or other intra-abdominal pathology.  No constitutional symptoms including fever, chills, or weight loss,  No h/o cancer, IVDU.  X-ray of the lumbar spine with no acute abnormalities.  There is suggestion of minimal anterior wedging of L1, but was thought to be due to positioning.  Suspect this  is positioning as the patient is nontender on exam in this area.  Will discharge to home with RICE protocol and anti-inflammatory medicine. Discussed ED return precaution and indications for PCP follow up. The patient acknowledges the plan and is agreeable at this time.   Final Clinical Impressions(s) / ED Diagnoses   Final diagnoses:  Acute myofascial strain of lumbar region, initial encounter    ED Discharge Orders         Ordered    cyclobenzaprine (FLEXERIL) 5 MG tablet  3 times daily     01/04/18 1632    meloxicam (MOBIC) 7.5 MG tablet  Daily     01/04/18 1632           Arrianna Catala A, PA-C 01/04/18 1656    Raeford Razor, MD 01/05/18 1356

## 2018-06-09 ENCOUNTER — Ambulatory Visit: Payer: Medicare Other | Admitting: Orthopaedic Surgery

## 2018-06-09 ENCOUNTER — Other Ambulatory Visit: Payer: Self-pay

## 2018-06-09 ENCOUNTER — Encounter: Payer: Self-pay | Admitting: Orthopaedic Surgery

## 2018-06-09 ENCOUNTER — Ambulatory Visit (INDEPENDENT_AMBULATORY_CARE_PROVIDER_SITE_OTHER): Payer: Medicare Other

## 2018-06-09 VITALS — BP 160/104 | HR 71 | Ht 66.0 in | Wt 179.0 lb

## 2018-06-09 DIAGNOSIS — M25511 Pain in right shoulder: Secondary | ICD-10-CM | POA: Diagnosis not present

## 2018-06-09 MED ORDER — HYDROCODONE-ACETAMINOPHEN 5-325 MG PO TABS: ORAL_TABLET | ORAL | 0 refills | Status: DC

## 2018-06-09 NOTE — Progress Notes (Signed)
Subjective:    Patient ID: Kathryn Woods, female    DOB: 1950-11-14, 68 y.o.   MRN: [id number]  HPI She started having right shoulder pain about three weeks ago that has gotten steadily worse.  She has no trauma, no numbness, no weakness, no swelling, no redness.  She does not know why it started then.  She has used ice, Tylenol and heat and rubs to the area with only slight help.  She has pain at night rolling over on it and with overhead use.   Review of Systems  Constitutional: Positive for activity change.  Musculoskeletal: Positive for arthralgias and back pain.  All other systems reviewed and are negative.  For Review of Systems, all other systems reviewed and are negative.  The following is a summary of the past history medically, past history surgically, known current medicines, social history and family history.  This information is gathered electronically by the computer from prior information and documentation.  I review this each visit and have found including this information at this point in the chart is beneficial and informative.   Past Medical History:  Diagnosis Date  . Arthritis    lumbar region, spondylolisthesis     Past Surgical History:  Procedure Laterality Date  . BACK SURGERY    . TUBAL LIGATION      No current outpatient medications on file prior to visit.   No current facility-administered medications on file prior to visit.     Social History   Socioeconomic History  . Marital status: Married    Spouse name: Not on file  . Number of children: Not on file  . Years of education: Not on file  . Highest education level: Not on file  Occupational History  . Not on file  Social Needs  . Financial resource strain: Not on file  . Food insecurity:    Worry: Not on file    Inability: Not on file  . Transportation needs:    Medical: Not on file    Non-medical: Not on file  Tobacco Use  . Smoking status: Former Smoker    Last attempt to  quit: 12/06/1982    Years since quitting: 35.5  . Smokeless tobacco: Never Used  Substance and Sexual Activity  . Alcohol use: No  . Drug use: Yes    Types: Marijuana    Comment: a couple times- for pain relief, pt. informed to not use anymore before surgery   . Sexual activity: Not on file  Lifestyle  . Physical activity:    Days per week: Not on file    Minutes per session: Not on file  . Stress: Not on file  Relationships  . Social connections:    Talks on phone: Not on file    Gets together: Not on file    Attends religious service: Not on file    Active member of club or organization: Not on file    Attends meetings of clubs or organizations: Not on file    Relationship status: Not on file  . Intimate partner violence:    Fear of current or ex partner: Not on file    Emotionally abused: Not on file    Physically abused: Not on file    Forced sexual activity: Not on file  Other Topics Concern  . Not on file  Social History Narrative  . Not on file    Family History  Problem Relation Age of Onset  . Heart attack Mother   .  Cancer Father        lung    BP (!) 160/104   Pulse 71   Ht 5\' 6"  (1.676 m)   Wt 179 lb (81.2 kg)   BMI 28.89 kg/m   Body mass index is 28.89 kg/m.     Objective:   Physical Exam Vitals signs reviewed.  Constitutional:      Appearance: She is well-developed.  HENT:     Head: Normocephalic and atraumatic.  Eyes:     Conjunctiva/sclera: Conjunctivae normal.     Pupils: Pupils are equal, round, and reactive to light.  Neck:     Musculoskeletal: Normal range of motion and neck supple.  Cardiovascular:     Rate and Rhythm: Normal rate and regular rhythm.  Pulmonary:     Effort: Pulmonary effort is normal.  Abdominal:     Palpations: Abdomen is soft.  Musculoskeletal:     Right shoulder: She exhibits decreased range of motion and tenderness.       Arms:  Skin:    General: Skin is warm and dry.  Neurological:     Mental Status:  She is alert and oriented to person, place, and time.     Cranial Nerves: No cranial nerve deficit.     Motor: No abnormal muscle tone.     Coordination: Coordination normal.     Deep Tendon Reflexes: Reflexes are normal and symmetric. Reflexes normal.  Psychiatric:        Behavior: Behavior normal.        Thought Content: Thought content normal.        Judgment: Judgment normal.      X-rays were done of the right shoulder, reported separately.  Negative.     Assessment & Plan:   Encounter Diagnosis  Name Primary?  . Pain in joint of right shoulder Yes   I feel she has a bursitis of the right shoulder.  PROCEDURE NOTE:  The patient request injection, verbal consent was obtained.  The right shoulder was prepped appropriately after time out was performed.   Sterile technique was observed and injection of 1 cc of Depo-Medrol 40 mg with several cc's of plain xylocaine. Anesthesia was provided by ethyl chloride and a 20-gauge needle was used to inject the shoulder area. A posterior approach was used.  The injection was tolerated well.  A band aid dressing was applied.  The patient was advised to apply ice later today and tomorrow to the injection sight as needed.  Begin Aleve one bid pc.  Return in two weeks.  Consider PT/OT if not better.  Call if any problem.  Precautions discussed.   Electronically Signed Darreld Mclean, MD 3/3/20209:09 AM

## 2018-06-22 DIAGNOSIS — M79601 Pain in right arm: Secondary | ICD-10-CM | POA: Diagnosis not present

## 2018-06-23 ENCOUNTER — Encounter: Payer: Self-pay | Admitting: Orthopaedic Surgery

## 2018-06-23 ENCOUNTER — Other Ambulatory Visit: Payer: Self-pay

## 2018-06-23 ENCOUNTER — Ambulatory Visit (INDEPENDENT_AMBULATORY_CARE_PROVIDER_SITE_OTHER): Payer: Medicare Other | Admitting: Orthopaedic Surgery

## 2018-06-23 VITALS — BP 150/96 | HR 87 | Ht 66.0 in | Wt 176.0 lb

## 2018-06-23 DIAGNOSIS — M25511 Pain in right shoulder: Secondary | ICD-10-CM | POA: Diagnosis not present

## 2018-06-23 MED ORDER — HYDROCODONE-ACETAMINOPHEN 5-325 MG PO TABS: ORAL_TABLET | ORAL | 0 refills | Status: DC

## 2018-06-23 NOTE — Progress Notes (Signed)
Patient [id number] Kathryn Woods, female DOB:06-30-1950, 68 y.o. OTR:[id number]  Chief Complaint  Patient presents with  . Shoulder Pain    right    HPI  Kathryn Woods is a 68 y.o. female who has increased pain of the right shoulder and right upper arm.  She went bowling and then had marked pain and swelling of the upper arm.  She has ecchymosis now and pain.  She has no other injury.  Nothing seems to help it.   Body mass index is 28.41 kg/m.  ROS  Review of Systems  Constitutional: Positive for activity change.  Musculoskeletal: Positive for arthralgias and back pain.  All other systems reviewed and are negative.   All other systems reviewed and are negative.  The following is a summary of the past history medically, past history surgically, known current medicines, social history and family history.  This information is gathered electronically by the computer from prior information and documentation.  I review this each visit and have found including this information at this point in the chart is beneficial and informative.    Past Medical History:  Diagnosis Date  . Arthritis    lumbar region, spondylolisthesis     Past Surgical History:  Procedure Laterality Date  . BACK SURGERY    . TUBAL LIGATION      Family History  Problem Relation Age of Onset  . Heart attack Mother   . Cancer Father        lung    Social History Social History   Tobacco Use  . Smoking status: Former Smoker    Last attempt to quit: 12/06/1982    Years since quitting: 35.5  . Smokeless tobacco: Never Used  Substance Use Topics  . Alcohol use: No  . Drug use: Yes    Types: Marijuana    Comment: a couple times- for pain relief, pt. informed to not use anymore before surgery     Allergies  Allergen Reactions  . Aspirin Other (See Comments)    "Messes stomach up real bad"    No current outpatient medications on file.   No current facility-administered medications for this  visit.      Physical Exam  Blood pressure (!) 150/96, pulse 87, height 5\' 6"  (1.676 m), weight 176 lb (79.8 kg).  Constitutional: overall normal hygiene, normal nutrition, well developed, normal grooming, normal body habitus. Assistive device:none  Musculoskeletal: gait and station Limp none, muscle tone and strength are normal, no tremors or atrophy is present.  .  Neurological: coordination overall normal.  Deep tendon reflex/nerve stretch intact.  Sensation normal.  Cranial nerves II-XII intact.   Skin:   Normal overall no scars, lesions, ulcers or rashes. No psoriasis.  Psychiatric: Alert and oriented x 3.  Recent memory intact, remote memory unclear.  Normal mood and affect. Well groomed.  Good eye contact.  Cardiovascular: overall no swelling, no varicosities, no edema bilaterally, normal temperatures of the legs and arms, no clubbing, cyanosis and good capillary refill.  Lymphatic: palpation is normal.  There is considerable ecchymosis of the right upper arm above elbow and extending proximally.  She is very tender over the biceps muscle.  NV intact. ROM is full but tender. All other systems reviewed and are negative   The patient has been educated about the nature of the problem(s) and counseled on treatment options.  The patient appeared to understand what I have discussed and is in agreement with it.  Encounter Diagnosis  Name Primary?  Marland Kitchen  Pain in joint of right shoulder Yes   I am concerned about a biceps tendon tear.  I will get MRI. I will give pain medicine. PLAN Call if any problems.  Precautions discussed.  Continue current medications.   Return to clinic after MRI of the right shoulder   I have reviewed the [facility] Controlled Substance Reporting System web site prior to prescribing narcotic medicine for this patient.   Electronically Signed Darreld Mclean, MD 3/17/20209:24 AM

## 2018-07-01 ENCOUNTER — Ambulatory Visit (HOSPITAL_COMMUNITY)
Admission: RE | Admit: 2018-07-01 | Discharge: 2018-07-01 | Disposition: A | Discharge status: Home / Self Care | Payer: Medicare Other | Source: Ambulatory Visit | Attending: Orthopaedic Surgery | Admitting: Orthopaedic Surgery

## 2018-07-01 ENCOUNTER — Other Ambulatory Visit: Payer: Self-pay

## 2018-07-01 DIAGNOSIS — M25511 Pain in right shoulder: Secondary | ICD-10-CM | POA: Insufficient documentation

## 2018-07-06 ENCOUNTER — Telehealth: Payer: Medicare Other | Admitting: Orthopedic Surgery

## 2018-07-07 ENCOUNTER — Telehealth (INDEPENDENT_AMBULATORY_CARE_PROVIDER_SITE_OTHER): Payer: Medicare Other | Admitting: Orthopaedic Surgery

## 2018-07-07 ENCOUNTER — Other Ambulatory Visit: Payer: Self-pay

## 2018-07-07 DIAGNOSIS — M25511 Pain in right shoulder: Secondary | ICD-10-CM

## 2018-07-07 MED ORDER — HYDROCODONE-ACETAMINOPHEN 5-325 MG PO TABS: ORAL_TABLET | ORAL | 0 refills | Status: DC

## 2018-07-07 NOTE — Progress Notes (Signed)
Virtual Visit via Telephone Note  I connected with Kathryn Woods on 07/07/18 at 10:10 AM EDT by telephone and verified that I am speaking with the correct person using two identifiers.   I discussed the limitations, risks, security and privacy concerns of performing an evaluation and management service by telephone and the availability of in person appointments. I also discussed with the patient that there may be a patient responsible charge related to this service. The patient expressed understanding and agreed to proceed.   History of Present Illness: Her right shoulder is worse.  Her MRI report shows: IMPRESSION: Complete tear of the long head of biceps from the superior labrum with retraction of 5-6 cm below the top of the humeral head.  Rotator cuff tendinopathy with a 1.6 cm from front to back full-thickness tear of the anterior and far lateral supraspinatus. Retraction is 1.5-2.5 cm. No atrophy.  Moderate acromioclavicular osteoarthritis.  Small volume of subacromial/subdeltoid fluid compatible with Bursitis.  I have explained the findings to her.  She still hurts.  She cannot have surgery secondary to the COVID-19 problem at this time.  I will call in more pain medicine for her.  She has no new trauma.   Observations/Objective: She says her right shoulder is more painful.  She has problems raising her arm up. She has no new trauma. She has no numbness.  Assessment and Plan: Encounter Diagnosis  Name Primary?  . Pain in joint of right shoulder Yes     Follow Up Instructions: I have called in pain medicine.  One month for another virtural visit.  I have reviewed the [place] Controlled Substance Reporting System web site prior to prescribing narcotic medicine for this patient.      I discussed the assessment and treatment plan with the patient. The patient was provided an opportunity to ask questions and all were answered. The patient agreed with the  plan and demonstrated an understanding of the instructions.   The patient was advised to call back or seek an in-person evaluation if the symptoms worsen or if the condition fails to improve as anticipated.  I provided 8 minutes of non-face-to-face time during this encounter.   Darreld Mclean, MD

## 2018-07-09 IMAGING — CR DG LUMBAR SPINE 2-3V
2 series · 2 of 2 positions shown · non-contrast
Comparison: MRI of 11/11/2016

CLINICAL DATA: L3-4 posterior lumbar interbody fusion.

EXAM:
LUMBAR SPINE - 2-3 VIEW

[lateral (1 of 2)]
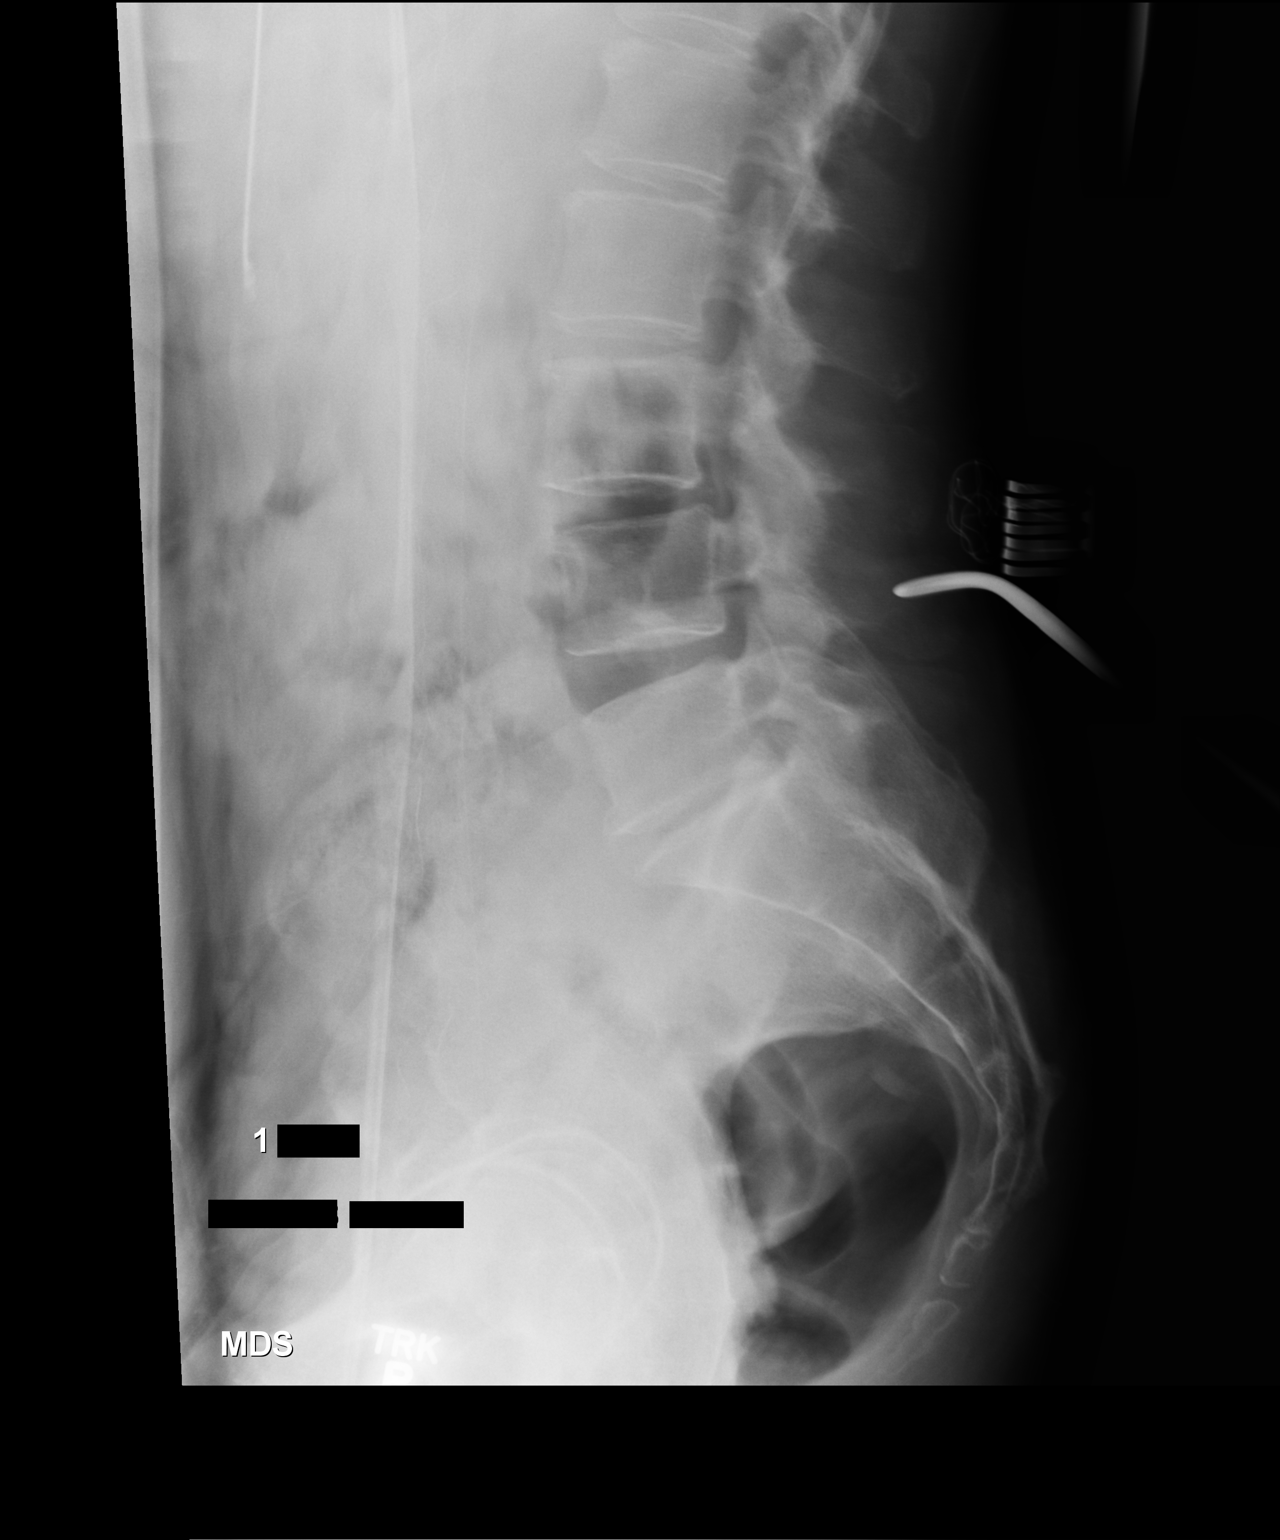

[lateral (2 of 2)]
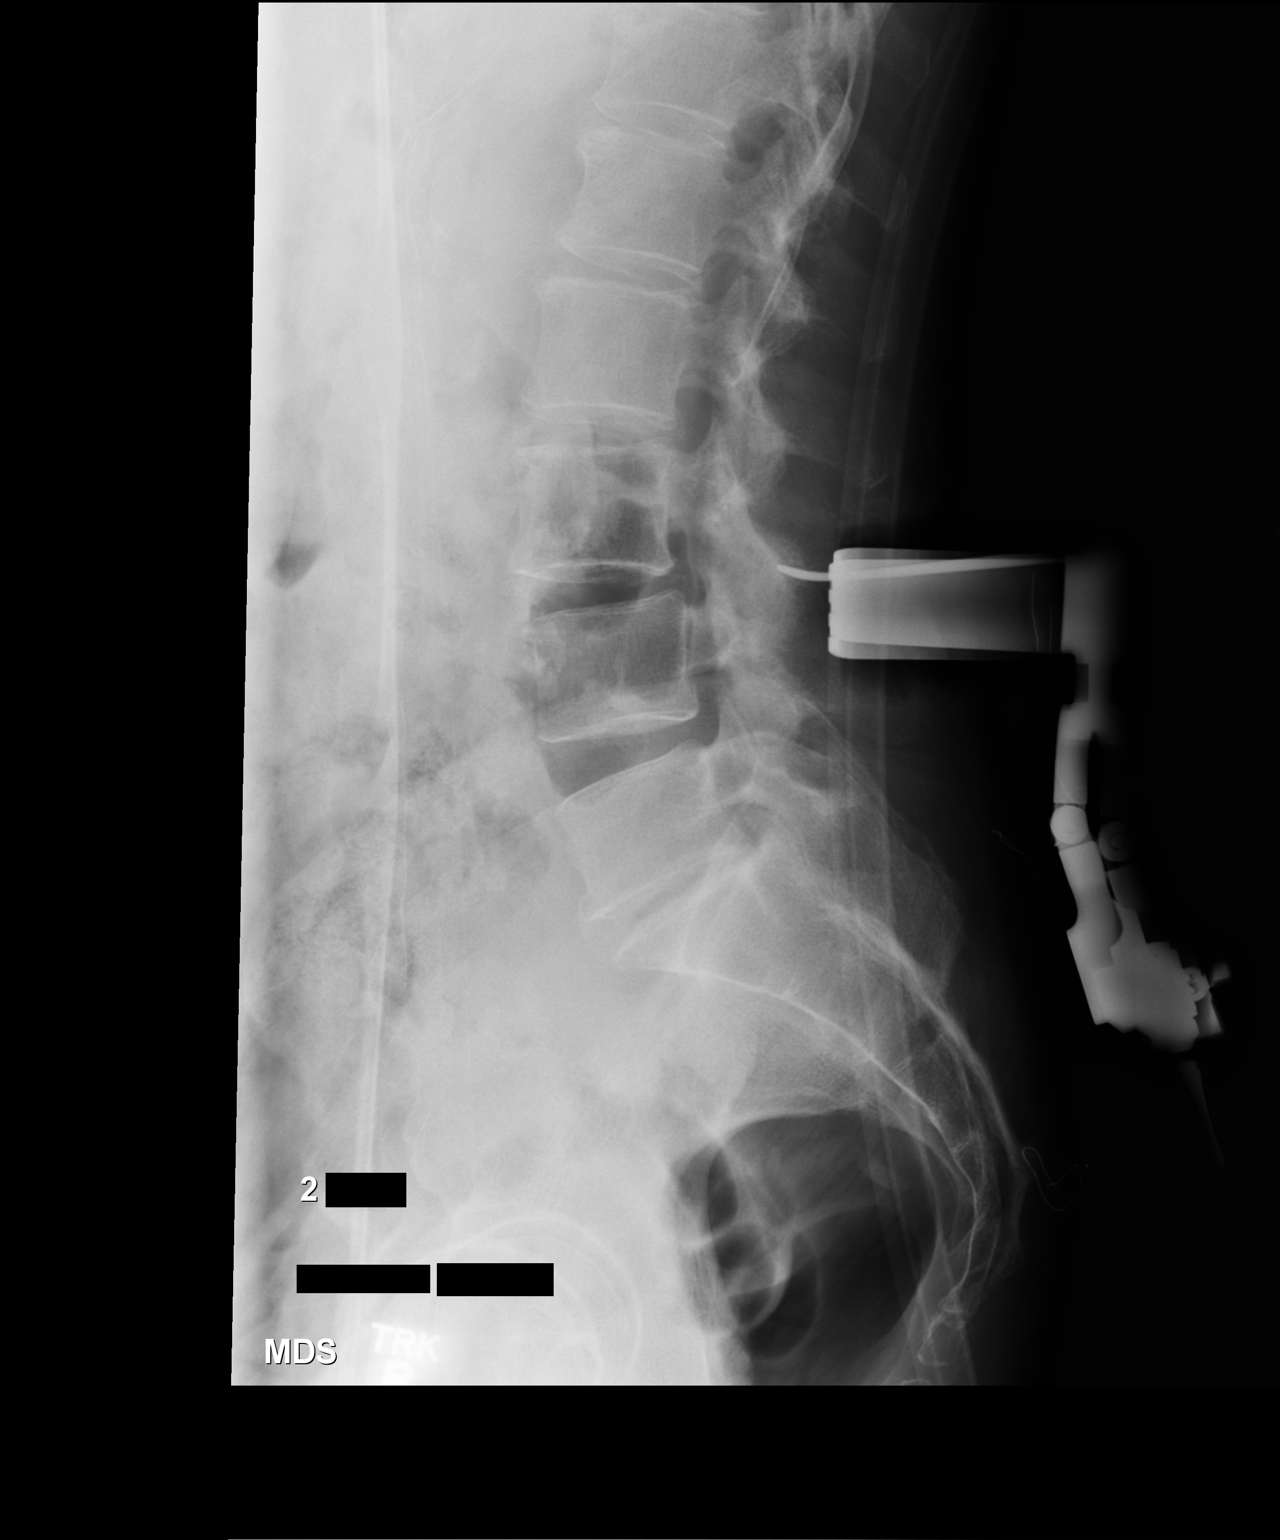

[2 of 2 positions shown; findings below may reference images not displayed]

FINDINGS: Single lateral view labeled 8926 hours. This demonstrates a surgical
device projecting posterior to the L4-5 interspace. Loss of
intervertebral disc height at the lumbosacral junction. Maintenance
of vertebral body height.
IMPRESSION: Intraoperative localization of L4-5.

## 2018-07-20 DIAGNOSIS — Z0000 Encounter for general adult medical examination without abnormal findings: Secondary | ICD-10-CM | POA: Diagnosis not present

## 2018-07-28 ENCOUNTER — Telehealth: Payer: Self-pay | Admitting: Orthopaedic Surgery

## 2018-07-28 DIAGNOSIS — E559 Vitamin D deficiency, unspecified: Secondary | ICD-10-CM | POA: Diagnosis not present

## 2018-07-28 DIAGNOSIS — Z0001 Encounter for general adult medical examination with abnormal findings: Secondary | ICD-10-CM | POA: Diagnosis not present

## 2018-07-28 DIAGNOSIS — E7849 Other hyperlipidemia: Secondary | ICD-10-CM | POA: Diagnosis not present

## 2018-07-28 DIAGNOSIS — R748 Abnormal levels of other serum enzymes: Secondary | ICD-10-CM | POA: Diagnosis not present

## 2018-07-28 MED ORDER — HYDROCODONE-ACETAMINOPHEN 5-325 MG PO TABS: ORAL_TABLET | ORAL | 0 refills | Status: DC

## 2018-07-28 NOTE — Telephone Encounter (Signed)
Patient requests refill on Hydrocodone/Acetaminophen 5-325  Mgs.   Qty 30  Sig: One tablet every four hours as needed for acute pain. Limit of five days per [place].  Patient states she uses [facility] in [place]

## 2018-08-03 ENCOUNTER — Emergency Department (HOSPITAL_COMMUNITY)
Admission: EM | Admit: 2018-08-03 | Discharge: 2018-08-03 | Disposition: A | Discharge status: Home / Self Care | Payer: Medicare Other | Attending: Emergency Medicine | Admitting: Emergency Medicine

## 2018-08-03 ENCOUNTER — Encounter (HOSPITAL_COMMUNITY): Payer: Self-pay | Admitting: *Deleted

## 2018-08-03 ENCOUNTER — Emergency Department (HOSPITAL_COMMUNITY): Payer: Medicare Other

## 2018-08-03 ENCOUNTER — Other Ambulatory Visit: Payer: Self-pay

## 2018-08-03 DIAGNOSIS — S8991XA Unspecified injury of right lower leg, initial encounter: Secondary | ICD-10-CM

## 2018-08-03 DIAGNOSIS — Y92009 Unspecified place in unspecified non-institutional (private) residence as the place of occurrence of the external cause: Secondary | ICD-10-CM | POA: Insufficient documentation

## 2018-08-03 DIAGNOSIS — Y998 Other external cause status: Secondary | ICD-10-CM | POA: Insufficient documentation

## 2018-08-03 DIAGNOSIS — Y9389 Activity, other specified: Secondary | ICD-10-CM | POA: Insufficient documentation

## 2018-08-03 DIAGNOSIS — X500XXA Overexertion from strenuous movement or load, initial encounter: Secondary | ICD-10-CM | POA: Diagnosis not present

## 2018-08-03 DIAGNOSIS — M150 Primary generalized (osteo)arthritis: Secondary | ICD-10-CM | POA: Diagnosis not present

## 2018-08-03 DIAGNOSIS — M25561 Pain in right knee: Secondary | ICD-10-CM | POA: Diagnosis not present

## 2018-08-03 DIAGNOSIS — Z87891 Personal history of nicotine dependence: Secondary | ICD-10-CM | POA: Insufficient documentation

## 2018-08-03 DIAGNOSIS — M1711 Unilateral primary osteoarthritis, right knee: Secondary | ICD-10-CM | POA: Diagnosis not present

## 2018-08-03 MED ORDER — HYDROCODONE-ACETAMINOPHEN 5-325 MG PO TABS
1.0000 | ORAL_TABLET | Freq: Once | ORAL | Status: AC
  Administered 2018-08-03: 1 via ORAL
  Filled 2018-08-03: qty 1

## 2018-08-03 NOTE — ED Triage Notes (Signed)
Patient stated she was going up a step when she felt a "pop" in her right knee one day ago.  Patient complains of unrelieved pain after use of ice, rest and Aleve.

## 2018-08-03 NOTE — ED Provider Notes (Signed)
[facility] Provider Note   [id number] Arrival date & time: 08/03/18  1333    History   Chief Complaint Chief Complaint  Patient presents with  . Knee Injury    right knee    HPI Kathryn Woods is a 68 y.o. female.     Pt presents to the ED today with right knee pain.  She was walking down a step yesterday evening and felt a pop in her knee.  Since then, she's had pain on the lateral aspect of her knee.  The pt has been walking with her husband's old crutches.  She denies any other injuries.     Past Medical History:  Diagnosis Date  . Arthritis    lumbar region, spondylolisthesis     Patient Active Problem List   Diagnosis Date Noted  . Spondylolisthesis at L3-L4 level 12/10/2016  . Bursitis, shoulder 02/24/2013  . HYPERLIPIDEMIA 02/04/2007  . WRIST SPRAIN, LEFT 02/04/2007  . MIGRAINE HEADACHE 01/15/2007  . BRONCHITIS, CHRONIC NOS 01/15/2007  . CONSTIPATION 01/15/2007  . IBS 01/15/2007  . DISC DISEASE, LUMBOSACRAL SPINE 01/15/2007  . LOW BACK PAIN, CHRONIC 01/15/2007  . MALAISE AND FATIGUE 01/15/2007    Past Surgical History:  Procedure Laterality Date  . BACK SURGERY    . TUBAL LIGATION       OB History   No obstetric history on file.      Home Medications    Prior to Admission medications   Medication Sig Start Date End Date Taking? Authorizing Provider  HYDROcodone-acetaminophen (NORCO/VICODIN) 5-325 MG tablet One tablet every six hours as needed for pain. 07/28/18   Darreld McleanKeeling, Wayne, MD    Family History Family History  Problem Relation Age of Onset  . Heart attack Mother   . Cancer Father        lung    Social History Social History   Tobacco Use  . Smoking status: Former Smoker    Last attempt to quit: 12/06/1982    Years since quitting: 35.6  . Smokeless tobacco: Never Used  Substance Use Topics  . Alcohol use: No  . Drug use: Yes    Types: Marijuana    Comment: a couple times- for pain relief,  pt. informed to not use anymore before surgery      Allergies   Aspirin   Review of Systems Review of Systems  Musculoskeletal:       Right knee pain  All other systems reviewed and are negative.    Physical Exam Updated Vital Signs BP 128/90   Pulse 77   Temp 98.1 F (36.7 C) (Oral)   Resp 16   Ht 5\' 6"  (1.676 m)   Wt 76.7 kg   SpO2 98%   BMI 27.28 kg/m   Physical Exam Vitals signs and nursing note reviewed.  Constitutional:      Appearance: Normal appearance.  HENT:     Head: Normocephalic and atraumatic.     Right Ear: External ear normal.     Left Ear: External ear normal.     Nose: Nose normal.     Mouth/Throat:     Mouth: Mucous membranes are moist.  Eyes:     Extraocular Movements: Extraocular movements intact.     Conjunctiva/sclera: Conjunctivae normal.     Pupils: Pupils are equal, round, and reactive to light.  Neck:     Musculoskeletal: Normal range of motion and neck supple.  Cardiovascular:     Rate and Rhythm: Normal rate  and regular rhythm.     Pulses: Normal pulses.     Heart sounds: Normal heart sounds.  Pulmonary:     Effort: Pulmonary effort is normal.     Breath sounds: Normal breath sounds.  Abdominal:     General: Abdomen is flat. Bowel sounds are normal.     Palpations: Abdomen is soft.  Musculoskeletal:     Right knee: She exhibits decreased range of motion and swelling. She exhibits normal patellar mobility. Tenderness found. Lateral joint line tenderness noted.  Skin:    General: Skin is warm.     Capillary Refill: Capillary refill takes less than 2 seconds.  Neurological:     General: No focal deficit present.     Mental Status: She is alert and oriented to person, place, and time.  Psychiatric:        Mood and Affect: Mood normal.        Behavior: Behavior normal.      ED Treatments / Results  Labs (all labs ordered are listed, but only abnormal results are displayed) Labs Reviewed - No data to display  EKG None   Radiology Dg Knee Complete 4 Views Right  Result Date: 08/03/2018 CLINICAL DATA:  Felt pop in right knee going up steps yesterday. EXAM: RIGHT KNEE - COMPLETE 4+ VIEW COMPARISON:  None. FINDINGS: Mild tricompartmental osteoarthritis. No evidence of acute fracture or dislocation. Possible small amount of fluid in the right knee joint. IMPRESSION: No acute findings. Mild osteoarthritis and possible small amount of joint fluid. Electronically Signed   By: Elberta Fortis M.D.   On: 08/03/2018 15:02    Procedures Procedures (including critical care time)  Medications Ordered in ED Medications  HYDROcodone-acetaminophen (NORCO/VICODIN) 5-325 MG per tablet 1 tablet (1 tablet Oral Given 08/03/18 1414)     Initial Impression / Assessment and Plan / ED Course  I have reviewed the triage vital signs and the nursing notes.  Pertinent labs & imaging results that were available during my care of the patient were reviewed by me and considered in my medical decision making (see chart for details).       No fx.  Pt placed in a knee immobilizer and given crutches.  She was also given a rx for a walker.  She sees Dr. Hilda Lias and wants to f/u with him.  She just received a rx for lortab a few days ago by Dr. Hilda Lias.  She is instructed to return if worse.  Final Clinical Impressions(s) / ED Diagnoses   Final diagnoses:  Injury of right knee, initial encounter    ED Discharge Orders         Ordered    For home use only DME 4 wheeled rolling walker with seat     08/03/18 1516           Jacalyn Lefevre, MD 08/04/18 [phone]

## 2018-08-03 NOTE — Discharge Instructions (Addendum)
Take your normal pain medications as needed.

## 2018-08-04 ENCOUNTER — Telehealth: Payer: Self-pay | Admitting: Orthopaedic Surgery

## 2018-08-04 NOTE — Telephone Encounter (Signed)
I can see, Wednesday or Thursday.

## 2018-08-04 NOTE — Telephone Encounter (Signed)
Patient has just been d/c from your care for her shoulder.  She went to the ED with a NEW PROBLEM of right knee popping on Monday, 08/03/18.  She said she was placed in a splint and given crutches to walk with.  She said she was not given any other medications.  She wants to come in this week to be seen by you for this right knee pain.  Do you want to see her this week or next week?

## 2018-08-06 ENCOUNTER — Ambulatory Visit: Payer: Medicare Other | Admitting: Orthopaedic Surgery

## 2018-08-06 ENCOUNTER — Other Ambulatory Visit: Payer: Self-pay

## 2018-08-06 ENCOUNTER — Encounter: Payer: Self-pay | Admitting: Orthopaedic Surgery

## 2018-08-06 ENCOUNTER — Telehealth: Payer: Self-pay | Admitting: Radiology

## 2018-08-06 VITALS — BP 143/91 | HR 78 | Temp 97.7°F | Ht 66.0 in | Wt 178.0 lb

## 2018-08-06 DIAGNOSIS — M25511 Pain in right shoulder: Secondary | ICD-10-CM

## 2018-08-06 DIAGNOSIS — M25561 Pain in right knee: Secondary | ICD-10-CM

## 2018-08-06 NOTE — Telephone Encounter (Signed)
Call patient with surgical consult appointment, I asked Dr Romeo Apple about scheduling and he will let me know.

## 2018-08-06 NOTE — Progress Notes (Signed)
Patient Kathryn Woods, female DOB:08/01/1950, 68 y.o. [id number]  Chief Complaint  Patient presents with  . Knee Pain    right     HPI  Kathryn Putnamatricia Woods is a 68 y.o. female who has acute pain in the right knee.  Monday she was making a step and felt severe pain and a pop in the right knee. She had swelling and lateral pain. She used ice and Advil but the pain did not go away. She went to the ER Tuesday.  X-rays were negative. She was given crutches and splint.  She has used ice.  She is better today.  She has no swelling today. She has no giving way. She has lateral tenderness.  She has no redness.  Her right shoulder is still painful. We had delayed seeing Dr. Romeo AppleHarrison about this because of COVID-19.  She has abnormal MRI with rotator cuff tear of supraspinatus and retraction and also biceps long head tear.  I will try to get appoint to see Dr. Romeo AppleHarrison.   Body mass index is 28.73 kg/m.  ROS  Review of Systems  Constitutional: Positive for activity change.  Musculoskeletal: Positive for arthralgias, back pain, gait problem and joint swelling.  All other systems reviewed and are negative.   All other systems reviewed and are negative.  The following is a summary of the past history medically, past history surgically, known current medicines, social history and family history.  This information is gathered electronically by the computer from prior information and documentation.  I review this each visit and have found including this information at this point in the chart is beneficial and informative.    Past Medical History:  Diagnosis Date  . Arthritis    lumbar region, spondylolisthesis     Past Surgical History:  Procedure Laterality Date  . BACK SURGERY    . TUBAL LIGATION      Family History  Problem Relation Age of Onset  . Heart attack Mother   . Cancer Father        lung    Social History Social History   Tobacco Use  . Smoking status: Former  Smoker    Last attempt to quit: 12/06/1982    Years since quitting: 35.6  . Smokeless tobacco: Never Used  Substance Use Topics  . Alcohol use: No  . Drug use: Yes    Types: Marijuana    Comment: a couple times- for pain relief, pt. informed to not use anymore before surgery     Allergies  Allergen Reactions  . Aspirin Other (See Comments)    "Messes stomach up real bad"    Current Outpatient Medications  Medication Sig Dispense Refill  . HYDROcodone-acetaminophen (NORCO/VICODIN) 5-325 MG tablet One tablet every six hours as needed for pain. 28 tablet 0   No current facility-administered medications for this visit.      Physical Exam  Blood pressure (!) 143/91, pulse 78, temperature 97.7 F (36.5 C), height 5\' 6"  (1.676 m), weight 178 lb (80.7 kg).  Constitutional: overall normal hygiene, normal nutrition, well developed, normal grooming, normal body habitus. Assistive device:none  Musculoskeletal: gait and station Limp right, muscle tone and strength are normal, no tremors or atrophy is present.  .  Neurological: coordination overall normal.  Deep tendon reflex/nerve stretch intact.  Sensation normal.  Cranial nerves II-XII intact.   Skin:   Normal overall no scars, lesions, ulcers or rashes. No psoriasis.  Psychiatric: Alert and oriented x 3.  Recent memory intact, remote  memory unclear.  Normal mood and affect. Well groomed.  Good eye contact.  Cardiovascular: overall no swelling, no varicosities, no edema bilaterally, normal temperatures of the legs and arms, no clubbing, cyanosis and good capillary refill.  Lymphatic: palpation is normal.  Right knee is tender laterally, she has no effusion, ROM is 0 to 110 with limp to the right.  The knee is stable.  NV intact.  Right shoulder has painful ROM more past 90 of abduction and flexion forward.  NV intact. She has no redness, no effusion.   All other systems reviewed and are negative   The patient has been educated  about the nature of the problem(s) and counseled on treatment options.  The patient appeared to understand what I have discussed and is in agreement with it.  Encounter Diagnoses  Name Primary?  . Acute pain of right knee Yes  . Pain in joint of right shoulder     PLAN Call if any problems.  Precautions discussed.  Continue current medications.   Return to clinic to see Dr. Romeo Apple about shoulder, contact me about knee if not improved in two weeks.   Electronically Signed Darreld Mclean, MD 4/30/20209:11 AM

## 2018-08-10 ENCOUNTER — Other Ambulatory Visit: Payer: Self-pay

## 2018-08-10 ENCOUNTER — Encounter: Payer: Self-pay | Admitting: Orthopedic Surgery

## 2018-08-10 ENCOUNTER — Telehealth (HOSPITAL_COMMUNITY): Payer: Self-pay

## 2018-08-10 ENCOUNTER — Ambulatory Visit: Payer: Medicare Other | Admitting: Orthopedic Surgery

## 2018-08-10 VITALS — BP 142/97 | HR 80 | Temp 98.4°F | Ht 66.0 in | Wt 178.0 lb

## 2018-08-10 DIAGNOSIS — S46211A Strain of muscle, fascia and tendon of other parts of biceps, right arm, initial encounter: Secondary | ICD-10-CM

## 2018-08-10 DIAGNOSIS — S46011A Strain of muscle(s) and tendon(s) of the rotator cuff of right shoulder, initial encounter: Secondary | ICD-10-CM | POA: Diagnosis not present

## 2018-08-10 DIAGNOSIS — M25511 Pain in right shoulder: Secondary | ICD-10-CM

## 2018-08-10 MED ORDER — HYDROCODONE-ACETAMINOPHEN 5-325 MG PO TABS: ORAL_TABLET | ORAL | 0 refills | Status: DC

## 2018-08-10 NOTE — Progress Notes (Signed)
Chief Complaint  Patient presents with  . Shoulder Pain    Right shoulder    Preop surgical consult referral from Dr. Hilda Lias  Chief complaint pain right shoulder  History 68 year old female injured her right shoulder bowling.  She was thought to have a biceps tear she had an injection she did not improve she had an MRI MRI shows tears of the right rotator cuff.  She was placed on hydrocodone in the interim but is still having pain and loss of function in the right shoulder  She would like to consider surgical treatment  Review of Systems  Constitutional: Negative for fever.  Respiratory: Negative for shortness of breath.   Cardiovascular: Negative for chest pain.  Skin: Negative.   Neurological: Negative for tingling and sensory change.   Past Medical History:  Diagnosis Date  . Arthritis    lumbar region, spondylolisthesis    Past Surgical History:  Procedure Laterality Date  . BACK SURGERY    . TUBAL LIGATION      BP (!) 142/97   Pulse 80   Temp 98.4 F (36.9 C)   Ht 5\' 6"  (1.676 m)   Wt 178 lb (80.7 kg)   BMI 28.73 kg/m  Physical Exam Vitals signs and nursing note reviewed.  Constitutional:      Appearance: Normal appearance.  Musculoskeletal:       Arms:  Lymphadenopathy:     Cervical: No cervical adenopathy.     Upper Body:     Right upper body: No supraclavicular adenopathy.     Left upper body: No supraclavicular adenopathy.  Neurological:     Mental Status: She is alert and oriented to person, place, and time.     Deep Tendon Reflexes:     Reflex Scores:      Bicep reflexes are 2+ on the right side and 2+ on the left side. Psychiatric:        Mood and Affect: Mood normal.     MEDICAL DECISION MAKING   Plain films  Clinical:   Right shoulder pain, no trauma   X-rays were done of the right shoulder, two views   There is some degenerative changes of the [facility] joint on the right.  Humeral head is well located within the glenoid area.  No  fracture or loose body is noted.  Bone quality is good.   Impression:  Negative left shoulder, no acute findings.   Electronically Signed Darreld Mclean, MD 3/3/20209:01 AM   MRI SCAN  CLINICAL DATA:  Right shoulder pain for 3 months.  No known injury.   EXAM: MRI OF THE RIGHT SHOULDER WITHOUT CONTRAST   TECHNIQUE: Multiplanar, multisequence MR imaging of the shoulder was performed. No intravenous contrast was administered.   COMPARISON:  Plain films right shoulder 06/09/2018.   FINDINGS: Rotator cuff: The patient has rotator cuff tendinopathy. There is a full-thickness and near full width tear of the supraspinatus measuring approximately 1.6 cm from front to back. Retraction is to the top of the humeral head, 1.5-2.5 cm. The rotator cuff is otherwise intact.   Muscles:  No focal atrophy or lesion.   Biceps long head: Completely torn from the superior labrum and retracted 5-6 cm below the top of the humeral head.   Acromioclavicular Joint: Moderate osteoarthritis. Type 2 acromion. Small volume of subacromial/subdeltoid fluid.   Glenohumeral Joint: Negative.   Labrum:  Intact.   Bones:  No fracture or focal lesion.   Other: None.   IMPRESSION: Complete tear of  the long head of biceps from the superior labrum with retraction of 5-6 cm below the top of the humeral head.   Rotator cuff tendinopathy with a 1.6 cm from front to back full-thickness tear of the anterior and far lateral supraspinatus. Retraction is 1.5-2.5 cm. No atrophy.   Moderate acromioclavicular osteoarthritis.   Small volume of subacromial/subdeltoid fluid compatible with bursitis.     Electronically Signed   By: Drusilla Kannerhomas  Dalessio M.D.   On: 07/01/2018 12:04   Both sets of x-rays were reviewed including the MRI scan On x-ray acromion appears to be type I there may be just a small osteophyte anteriorly  MRI shows a proximal biceps tendon tear 1.6 cm full-thickness rotator cuff tear of the  supraspinatus with 1-1/2 to 2-1/2 cm of retraction no atrophy moderate AC joint arthritis is seen without impinging osteophyte   Encounter Diagnoses  Name Primary?  . Pain in joint of right shoulder   . Rupture of right proximal biceps tendon, initial encounter   . Traumatic complete tear of right rotator cuff, initial encounter Yes    Nonoperative treatment has included injection of the right shoulder opioid medication  The patient will have physical therapy for the next 4 weeks she will follow-up in 5 weeks if she is not improved we will revisit the surgical option  Meds ordered this encounter  Medications  . HYDROcodone-acetaminophen (NORCO/VICODIN) 5-325 MG tablet    Sig: One tablet every six hours as needed for pain.    Dispense:  28 tablet    Refill:  0

## 2018-08-10 NOTE — Patient Instructions (Signed)
Physical therapy has been ordered for you at [facility] [phone] is the phone number to call if you want to call to schedule.  

## 2018-08-10 NOTE — Telephone Encounter (Signed)
Called patient to discuss recent referral for OT that was sent to our clinic. Patient did not answer and message was left. Will try to get a hold of patient at a later time.   Limmie Shagun, OTR/L,CBIS  [phone]

## 2018-08-14 NOTE — Telephone Encounter (Signed)
Patient seen on 08/04/18 by Dr Hilda Lias

## 2018-08-20 ENCOUNTER — Other Ambulatory Visit: Payer: Self-pay

## 2018-08-20 ENCOUNTER — Ambulatory Visit (HOSPITAL_COMMUNITY): Payer: Medicare Other | Attending: Orthopedic Surgery | Admitting: Occupational Therapy

## 2018-08-20 ENCOUNTER — Encounter (HOSPITAL_COMMUNITY): Payer: Self-pay | Admitting: Occupational Therapy

## 2018-08-20 DIAGNOSIS — M25511 Pain in right shoulder: Secondary | ICD-10-CM | POA: Diagnosis not present

## 2018-08-20 DIAGNOSIS — R29898 Other symptoms and signs involving the musculoskeletal system: Secondary | ICD-10-CM | POA: Insufficient documentation

## 2018-08-20 DIAGNOSIS — M25611 Stiffness of right shoulder, not elsewhere classified: Secondary | ICD-10-CM | POA: Insufficient documentation

## 2018-08-20 DIAGNOSIS — G8929 Other chronic pain: Secondary | ICD-10-CM | POA: Insufficient documentation

## 2018-08-20 NOTE — Patient Instructions (Signed)
Perform each exercise __10-15______ reps. 2-3x days.   Protraction  Start by holding a wand or cane at chest height.  Next, slowly push the wand outwards in front of your body so that your elbows become fully straightened. Then, return to the original position.     Shoulder FLEXION  In the standing position, hold a wand/cane with both arms, palms down on both sides. Raise up the wand/cane allowing your unaffected arm to perform most of the effort. Your affected arm should be partially relaxed.      Internal/External ROTATION  In the standing position, hold a wand/cane with both hands keeping your elbows bent. Move your arms and wand/cane to one side.  Your affected arm should be partially relaxed while your unaffected arm performs most of the effort.       Shoulder ABDUCTION   While holding a wand/cane palm face up on the injured side and palm face down on the uninjured side, slowly raise up your injured arm to the side.                     Horizontal Abduction/Adduction      Straight arms holding cane at shoulder height, bring cane to right, center, left. Repeat starting to left.   Copyright  VHI. All rights reserved.      Seated Row   Sit up straight with elbows by your sides. Pull back with shoulders/elbows, keeping forearms straight, as if pulling back on the reins of a horse. Squeeze shoulder blades together.      Shoulder Elevation    Sit up straight with arms by your sides. Slowly bring your shoulders up towards your ears.      Shoulder Extension    Sit up straight with both arms by your side, draw your arms back behind your waist. Keep your elbows straight.

## 2018-08-20 NOTE — Therapy (Signed)
[facility] [address] [place], [place], [address] Phone: [phone]   Fax:  [phone]  Occupational Therapy Evaluation  Patient Details  Name: Kathryn Woods MRN: [id number] Date of Birth: 07/31/1950 Referring Provider (OT): Dr. Fuller CanadaStanley Harrison   Encounter Date: 08/20/2018  OT End of Session - 08/20/18 1508    Visit Number  1    Number of Visits  2    Date for OT Re-Evaluation  09/04/18    Authorization Type  UHC Medicare; $35 copay    Authorization Time Period  no visit limit, based on medical necessity    OT Start Time  1426    OT Stop Time  1500    OT Time Calculation (min)  34 min    Activity Tolerance  Patient tolerated treatment well    Behavior During Therapy  [facility] assessed/performed       Past Medical History:  Diagnosis Date  . Arthritis    lumbar region, spondylolisthesis     Past Surgical History:  Procedure Laterality Date  . BACK SURGERY    . TUBAL LIGATION      There were no vitals filed for this visit.  Subjective Assessment - 08/20/18 1506    Subjective   S: I might have to have surgery.     Pertinent History  Pt is a 68 y/o female presenting with right shoulder pain due to a rotator cuff tear and complete tear of long head of biceps tendon. Pt has been referred to occupational therapy for evaluation and treatment by Dr. Fuller CanadaStanley Harrison.     Special Tests  FOTO: 61/100    Currently in Pain?  Yes    Pain Score  5     Pain Location  Shoulder    Pain Orientation  Right    Pain Descriptors / Indicators  Aching;Sore    Pain Type  Chronic pain    Pain Radiating Towards  n/a    Pain Onset  More than a month ago    Pain Frequency  Constant    Aggravating Factors   certain movements, reaching up and out    Pain Relieving Factors  ice, pain medication    Effect of Pain on Daily Activities  mod effect on ADLs    Multiple Pain Sites  No        [facility] OT Assessment - 08/20/18 1427      Assessment   Medical Diagnosis  right RC tear    Referring Provider (OT)  Dr. Fuller CanadaStanley Harrison    Onset Date/Surgical Date  03/22/19    Hand Dominance  Right    Prior Therapy  None      Precautions   Precautions  None      Restrictions   Weight Bearing Restrictions  No      Balance Screen   Has the patient fallen in the past 6 months  No    Has the patient had a decrease in activity level because of a fear of falling?   No    Is the patient reluctant to leave their home because of a fear of falling?   No      Prior Function   Level of Independence  Independent    Vocation  Retired    Electrical engineerLeisure  yardwork, plays softball      ADL   ADL comments  Pt is having difficulty with dressing, bathing, sweeping & housework, reaching up, lifting heavy/weighted objects  Written Expression   Dominant Hand  Right      Cognition   Overall Cognitive Status  Within Functional Limits for tasks assessed      Observation/Other Assessments   Focus on Therapeutic Outcomes (FOTO)   61/100      ROM / Strength   AROM / PROM / Strength  AROM;PROM;Strength      Palpation   Palpation comment  moderate fascial restrictions in right upper arm, deltoid, trapezius, and scapularis regions      AROM   Overall AROM Comments  Assessed seated, er/IR adducted    AROM Assessment Site  Shoulder    Right/Left Shoulder  Right    Right Shoulder Flexion  131 Degrees    Right Shoulder ABduction  85 Degrees    Right Shoulder Internal Rotation  90 Degrees    Right Shoulder External Rotation  25 Degrees      PROM   Overall PROM Comments  Assessed supine, er/IR adducted    PROM Assessment Site  Shoulder    Right/Left Shoulder  Right    Right Shoulder Flexion  155 Degrees    Right Shoulder ABduction  152 Degrees    Right Shoulder Internal Rotation  90 Degrees    Right Shoulder External Rotation  55 Degrees      Strength   Overall Strength  Within functional limits for tasks performed    Overall Strength  Comments  Assessed seated, er/IR adducted    Strength Assessment Site  Shoulder    Right/Left Shoulder  Right    Right Shoulder Flexion  4+/5    Right Shoulder ABduction  4/5    Right Shoulder Internal Rotation  4+/5    Right Shoulder External Rotation  4/5                      OT Education - 08/20/18 1508    Education Details  AA/ROM, scapular A/ROM, educated on self-myofascial release with tennis ball.     Person(s) Educated  Patient    Methods  Explanation;Demonstration;Handout    Comprehension  Verbalized understanding;Returned demonstration       OT Short Term Goals - 08/20/18 1512      OT SHORT TERM GOAL #1   Title  Pt will be provided with and educated on HEP to improve ability to use RUE as dominant during ADLs.     Time  2    Period  Weeks    Status  New    Target Date  09/04/18      OT SHORT TERM GOAL #2   Title  Pt will decrease pain in RUE to 3/10 to improve ability to perform yardwork tasks.     Time  2    Period  Weeks    Status  New      OT SHORT TERM GOAL #3   Title  Pt will increase RUE A/ROM to [facility] to increase ability to perform functional reaching tasks during ADLs and housework.     Time  2    Period  Weeks    Status  New      OT SHORT TERM GOAL #4   Title  Pt will decrease fascial restrictions in RUE to minimal amounts to improve mobility required for reaching overhead and behind back.     Time  2    Period  Weeks    Status  New  Plan - 08/20/18 1509    Clinical Impression Statement  A: Pt is a 68 y/o female presenting with right shoulder pain secondary to torn RC and bicep tendon limiting ability to complete functional tasks. Pt reports she is considering surgery, but wanted to try therapy as a conservative measure first. Pt would like to complete evaluation and then 1 treatment visit with plan to continue through HEP due to financial issues.     OT Occupational Profile and History  Problem Focused Assessment -  Including review of records relating to presenting problem    Occupational performance deficits (Please refer to evaluation for details):  ADL's;IADL's;Leisure    Body Structure / Function / Physical Skills  ADL;Strength;Pain;UE functional use;IADL;ROM;Fascial restriction    Rehab Potential  Good    Clinical Decision Making  Limited treatment options, no task modification necessary    Comorbidities Affecting Occupational Performance:  None    Modification or Assistance to Complete Evaluation   No modification of tasks or assist necessary to complete eval    OT Frequency  Biweekly    OT Duration  2 weeks    OT Treatment/Interventions  Self-care/ADL training;Moist Heat;Therapeutic activities;Ultrasound;Therapeutic exercise;Cryotherapy;Passive range of motion;Electrical Stimulation;Manual Therapy;Patient/family education    Plan  P: Pt will benefit from skilled OT services to decrease pain and fascial restrictions, increase ROM, strength, and functional use of RUE during ADLs. Treatment plan: eval and 1 treatment visit to update HEP; myofascial release, manual techniques, P/ROM, AA/ROM, A/ROM, general RUE strengthening, scapular mobility/strengthening, modalities prn    Consulted and Agree with Plan of Care  Patient       Patient will benefit from skilled therapeutic intervention in order to improve the following deficits and impairments:  Body Structure / Function / Physical Skills  Visit Diagnosis: Chronic right shoulder pain  Other symptoms and signs involving the musculoskeletal system  Stiffness of right shoulder, not elsewhere classified    Problem List Patient Active Problem List   Diagnosis Date Noted  . Spondylolisthesis at L3-L4 level 12/10/2016  . Bursitis, shoulder 02/24/2013  . HYPERLIPIDEMIA 02/04/2007  . WRIST SPRAIN, LEFT 02/04/2007  . MIGRAINE HEADACHE 01/15/2007  . BRONCHITIS, CHRONIC NOS 01/15/2007  . CONSTIPATION 01/15/2007  . IBS 01/15/2007  . DISC DISEASE,  LUMBOSACRAL SPINE 01/15/2007  . LOW BACK PAIN, CHRONIC 01/15/2007  . MALAISE AND FATIGUE 01/15/2007   Ezra Sites, OTR/L  [phone] 08/20/2018, 3:15 PM  [facility] [address] [place], [place], [address] Phone: [phone]   Fax:  [phone]  Name: Kathryn Woods MRN: [id number] Date of Birth: [birth year]

## 2018-08-26 ENCOUNTER — Other Ambulatory Visit: Payer: Self-pay | Admitting: Radiology

## 2018-08-26 DIAGNOSIS — M25511 Pain in right shoulder: Secondary | ICD-10-CM

## 2018-08-26 NOTE — Telephone Encounter (Signed)
Patient called and asked for a refill of hydrocodone.

## 2018-08-27 NOTE — Telephone Encounter (Signed)
PATIENT USES [facility]

## 2018-09-01 ENCOUNTER — Telehealth: Payer: Self-pay | Admitting: Orthopedic Surgery

## 2018-09-01 NOTE — Telephone Encounter (Signed)
I called her to advise. She voiced understanding.  

## 2018-09-01 NOTE — Telephone Encounter (Signed)
Declined, on opioids too long  Use alternate pain reliever s Tooical Tylenol Naprosyn

## 2018-09-01 NOTE — Telephone Encounter (Signed)
Patient requests refill on Hydrocodone/Acetaminophen 5-325  Mgs.  Qty  28  Sig: One tablet every six hours as needed for pain.  Patient uses [facility] in [place]

## 2018-09-04 ENCOUNTER — Ambulatory Visit (HOSPITAL_COMMUNITY): Payer: Medicare Other | Admitting: Occupational Therapy

## 2018-09-09 ENCOUNTER — Ambulatory Visit (HOSPITAL_COMMUNITY): Payer: Medicare Other | Attending: Orthopedic Surgery

## 2018-09-09 ENCOUNTER — Telehealth (HOSPITAL_COMMUNITY): Payer: Self-pay | Admitting: Internal Medicine

## 2018-09-09 ENCOUNTER — Other Ambulatory Visit: Payer: Self-pay

## 2018-09-09 ENCOUNTER — Encounter (HOSPITAL_COMMUNITY): Payer: Self-pay

## 2018-09-09 DIAGNOSIS — R29898 Other symptoms and signs involving the musculoskeletal system: Secondary | ICD-10-CM

## 2018-09-09 DIAGNOSIS — M25511 Pain in right shoulder: Secondary | ICD-10-CM | POA: Insufficient documentation

## 2018-09-09 DIAGNOSIS — M25611 Stiffness of right shoulder, not elsewhere classified: Secondary | ICD-10-CM | POA: Diagnosis not present

## 2018-09-09 DIAGNOSIS — G8929 Other chronic pain: Secondary | ICD-10-CM

## 2018-09-09 NOTE — Telephone Encounter (Signed)
09/09/18  8:48 spoke to patient and let her know that I moved her today's appt to 11:30 instead of 11:15 and she said ok

## 2018-09-09 NOTE — Patient Instructions (Signed)
(  Home) Extension: Isometric / Bilateral Arm Retraction - Sitting   Facing anchor, hold hands and elbow at shoulder height, with elbow bent.  Pull arms back to squeeze shoulder blades together. Repeat 10-15 times. 1-2 times/day.   (Clinic) Extension / Flexion (Assist)   Face anchor, pull arms back, keeping elbow straight, and squeze shoulder blades together. Repeat 10-15 times. 1-2 times/day.   Copyright  VHI. All rights reserved.   (Home) Retraction: Row - Bilateral (Anchor)   Facing anchor, arms reaching forward, pull hands toward stomach, keeping elbows bent and at your sides and pinching shoulder blades together. Repeat 10-15 times. 1-2 times/day.   Copyright  VHI. All rights reserved.

## 2018-09-09 NOTE — Therapy (Signed)
[facility] [address] [place], [place], [address] Phone: [phone]   Fax:  [phone]  Occupational Therapy Treatment Reassessment/discharge Patient Details  Name: Kathryn Woods MRN: [id number] Date of Birth: 06/07/1950 Referring Provider (OT): Dr. Arther Abbott   Encounter Date: 09/09/2018  OT End of Session - 09/09/18 1255    Visit Number  2    Number of Visits  2    Authorization Type  [employer]HC Medicare; $35 copay    Authorization Time Period  no visit limit, based on medical necessity    OT Start Time  1135   reassess and discharge   OT Stop Time  1210    OT Time Calculation (min)  35 min    Activity Tolerance  Patient tolerated treatment well    Behavior During Therapy  [facility] for tasks assessed/performed       Past Medical History:  Diagnosis Date  . Arthritis    lumbar region, spondylolisthesis     Past Surgical History:  Procedure Laterality Date  . BACK SURGERY    . TUBAL LIGATION      There were no vitals filed for this visit.  Subjective Assessment - 09/09/18 1251    Subjective   S: It hurts if I move it the wrong way or move it too much.     Special Tests  FOTO score: 68/100    Currently in Pain?  No/denies         [facility] OT Assessment - 09/09/18 1138      Assessment   Medical Diagnosis  right RC tear      Precautions   Precautions  None      ROM / Strength   AROM / PROM / Strength  Strength;PROM;AROM      Palpation   Palpation comment  moderate fascial restrictions in right upper arm, deltoid, trapezius, and scapularis regions      AROM   Overall AROM Comments  Assessed seated, er/IR adducted    AROM Assessment Site  Shoulder    Right/Left Shoulder  Right    Right Shoulder Flexion  155 Degrees   previous: 131   Right Shoulder ABduction  151 Degrees   previous: 85   Right Shoulder Internal Rotation  90 Degrees   previous: same   Right Shoulder External Rotation  58 Degrees   previous:  25     PROM   Overall PROM Comments  Assessed supine, er/IR adducted    PROM Assessment Site  Shoulder    Right/Left Shoulder  Right    Right Shoulder Flexion  165 Degrees   previous: 155   Right Shoulder ABduction  180 Degrees   previous: 152   Right Shoulder Internal Rotation  90 Degrees   previous: same   Right Shoulder External Rotation  65 Degrees   previous: 55     Strength   Overall Strength Comments  Assessed seated, er/IR adducted    Strength Assessment Site  Shoulder    Right/Left Shoulder  Right    Right Shoulder Flexion  5/5   previous: 4+/5   Right Shoulder ABduction  4+/5   previous: 4/5   Right Shoulder Internal Rotation  5/5   previous: 4+/5   Right Shoulder External Rotation  5/5   previous: 4/5              OT Treatments/Exercises (OP) - 09/09/18 1146      Exercises   Exercises  Shoulder  Shoulder Exercises: Supine   Protraction  PROM;5 reps;AROM;10 reps    Horizontal ABduction  PROM;5 reps;AROM;10 reps    External Rotation  PROM;5 reps;AROM;10 reps    Internal Rotation  PROM;5 reps;AROM;10 reps    Flexion  PROM;5 reps;AROM;10 reps    ABduction  PROM;5 reps;AROM;10 reps      Shoulder Exercises: Standing   Extension  Theraband;10 reps    Theraband Level (Shoulder Extension)  Level 3 (Green)    Row  Theraband;10 reps    Theraband Level (Shoulder Row)  Level 3 (Green)    Retraction  Theraband;10 reps    Theraband Level (Shoulder Retraction)  Level 3 (Green)      Manual Therapy   Manual Therapy  Myofascial release    Manual therapy comments  Manual therapy completed prior to exercises    Myofascial Release  myofascial release and manual stretching completed to right upper arm, trapezius, and scapularis region to decrease fascial restrictions and increase joint mobility in a pain free zone.              OT Education - 09/09/18 1254    Education Details  HEP updated. Provided green theraband scapular strengthening. Pt is to spot  AA/ROM exercises. Continue with self-myofascial release with ball. Education on alternating between ice and heat for pain management and to relax muscles.    Person(s) Educated  Patient    Methods  Explanation;Demonstration;Verbal cues;Handout    Comprehension  Returned demonstration;Verbalized understanding       OT Short Term Goals - 09/09/18 1256      OT SHORT TERM GOAL #1   Title  Pt will be provided with and educated on HEP to improve ability to use RUE as dominant during ADLs.     Time  2    Period  Weeks    Status  Achieved    Target Date  09/04/18      OT SHORT TERM GOAL #2   Title  Pt will decrease pain in RUE to 3/10 to improve ability to perform yardwork tasks.     Time  2    Period  Weeks    Status  Achieved      OT SHORT TERM GOAL #3   Title  Pt will increase RUE A/ROM to [facility] to increase ability to perform functional reaching tasks during ADLs and housework.     Time  2    Period  Weeks    Status  Achieved      OT SHORT TERM GOAL #4   Title  Pt will decrease fascial restrictions in RUE to minimal amounts to improve mobility required for reaching overhead and behind back.     Time  2    Period  Weeks    Status  Not Met               Plan - 09/09/18 1256    Clinical Impression Statement  A: Reassessment completed this date. patient has met 3/4 therapy goals. She has been completing her HEP independently at home since evaluation and reports completing 2 times a day. Patient has made improvement with both A/ROM and strength. She continues to have pain with overuse and certain movements. HEP was updated for scapular strengthening with the green band. Patient wishes to continue with HEP independently at home until her follow up with Dr. Aline Brochure to determine when her surgery will be.     Body Structure / Function / Physical Skills  ADL;Strength;Pain;UE functional  use;IADL;ROM;Fascial restriction    Plan  P: D/C from OT services with HEP.     Consulted and Agree  with Plan of Care  Patient       Patient will benefit from skilled therapeutic intervention in order to improve the following deficits and impairments:  Body Structure / Function / Physical Skills  Visit Diagnosis: Chronic right shoulder pain  Other symptoms and signs involving the musculoskeletal system  Stiffness of right shoulder, not elsewhere classified    Problem List Patient Active Problem List   Diagnosis Date Noted  . Spondylolisthesis at L3-L4 level 12/10/2016  . Bursitis, shoulder 02/24/2013  . HYPERLIPIDEMIA 02/04/2007  . WRIST SPRAIN, LEFT 02/04/2007  . MIGRAINE HEADACHE 01/15/2007  . BRONCHITIS, CHRONIC NOS 01/15/2007  . CONSTIPATION 01/15/2007  . IBS 01/15/2007  . [place] DISEASE, LUMBOSACRAL SPINE 01/15/2007  . LOW BACK PAIN, CHRONIC 01/15/2007  . MALAISE AND FATIGUE 01/15/2007   OCCUPATIONAL THERAPY DISCHARGE SUMMARY  Visits from Start of Care: 2  Current functional level related to goals / functional outcomes: See above   Remaining deficits: See above   Education / Equipment: See above Plan: Patient agrees to discharge.  Patient goals were met. Patient is being discharged due to meeting the stated rehab goals.  ?????        Ailene Ravel, OTR/L,CBIS  [phone]  09/09/2018, 12:59 PM  [facility] [address] [place], [place], [address] Phone: [phone]   Fax:  [phone]  Name: Shelvia Fojtik MRN: [id number] Date of Birth: 11/04/1950

## 2018-09-21 ENCOUNTER — Encounter: Payer: Self-pay | Admitting: Orthopedic Surgery

## 2018-09-21 ENCOUNTER — Other Ambulatory Visit: Payer: Self-pay

## 2018-09-21 ENCOUNTER — Ambulatory Visit (INDEPENDENT_AMBULATORY_CARE_PROVIDER_SITE_OTHER): Payer: Medicare Other | Admitting: Orthopedic Surgery

## 2018-09-21 DIAGNOSIS — M25511 Pain in right shoulder: Secondary | ICD-10-CM | POA: Diagnosis not present

## 2018-09-21 MED ORDER — HYDROCODONE-ACETAMINOPHEN 5-325 MG PO TABS: ORAL_TABLET | ORAL | 0 refills | Status: DC

## 2018-09-21 NOTE — Progress Notes (Signed)
Chief Complaint  Patient presents with  . Shoulder Pain    Right shoulder pain since March   Preop history and physical for right shoulder surgery  History: This is a 68 year old female's been treated since March 2020 with right shoulder pain.  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  Because of COVID-19 surgery was postponed she went to physical therapy this therapy has made her shoulder pain worse.  She complains of moderate to severe nonradiating aching pain over the right shoulder painful range of motion crepitance trouble sleeping and weakness in abduction and flexion despite physical therapy and hydrocodone for pain  Review of Systems  Musculoskeletal: Positive for joint pain.  Neurological: Positive for focal weakness.  All other systems reviewed and are negative.   Past Medical History:  Diagnosis Date  . Arthritis    lumbar region, spondylolisthesis    Past Surgical History:  Procedure Laterality Date  . BACK SURGERY    . TUBAL LIGATION     Social History   Tobacco Use  . Smoking status: Former Smoker    Quit date: 12/06/1982    Years since quitting: 35.8  . Smokeless tobacco: Never Used  Substance Use Topics  . Alcohol use: No  . Drug use: Yes    Types: Marijuana    Comment: a couple times- for pain relief, pt. informed to not use anymore before surgery     Current Outpatient Medications:  .  HYDROcodone-acetaminophen (NORCO/VICODIN) 5-325 MG tablet, One tablet every six hours as needed for pain., Disp: 28 tablet, Rfl: 0   BP (!) 159/104   Pulse 68   Temp (!) 97 F (36.1 C)   Ht 5\' 6"  (1.676 m)   Wt 179 lb (81.2 kg)   BMI 28.89 kg/m  Physical Exam Vitals signs and nursing note reviewed.  Constitutional:      Appearance: Normal appearance.  Neurological:     Mental Status: She is alert and oriented to person, place, and time.  Psychiatric:        Mood and  Affect: Mood normal.    Cardiovascular findings normal pulse and perfusion both upper extremities without swelling or varicose veins  Lymph nodes in the cervical region are normal  Her skin is clean dry and intact without caf au lait spots ulcerations or nodules  She has excellent coordination of her upper extremities normal reflexes normal sensation  Her right shoulder is weak decreased range of motion severe crepitation on range of motion she is not contracted as her external rotation is normal she has weakness in abduction and flexion and mild weakness in external rotation with a stable shoulder muscle tone is normal   Medical decision making  surgery will be recommended for the patient arthroscopy right shoulder rotator cuff repair with a speed bridge construct  The procedure has been fully reviewed with the patient; The risks and benefits of surgery have been discussed and explained and understood. Alternative treatment has also been reviewed, questions were encouraged and answered. The postoperative plan is also been reviewed.   Encounter Diagnosis  Name Primary?  . Pain in joint of right shoulder      Prescription written  Meds ordered this encounter  Medications  . HYDROcodone-acetaminophen (NORCO/VICODIN) 5-325 MG tablet    Sig: One tablet every six hours as needed for pain.    Dispense:  28 tablet    Refill:  0  Image interpretation The patient has chronic tendinopathy of the rotator cuff she has a full-thickness tear of the supraspinatus with retraction of 2-1/2 cm and a torn biceps tendon with further retraction and some arthritis and type II acromion near the [facility]  MRI right shoulder report  FINDINGS: Rotator cuff: The patient has rotator cuff tendinopathy. There is a full-thickness and near full width tear of the supraspinatus measuring approximately 1.6 cm from front to back. Retraction is to the top of the humeral head, 1.5-2.5 cm. The rotator cuff  is otherwise intact.  Muscles:  No focal atrophy or lesion.  Biceps long head: Completely torn from the superior labrum and retracted 5-6 cm below the top of the humeral head.  Acromioclavicular Joint: Moderate osteoarthritis. Type 2 acromion. Small volume of subacromial/subdeltoid fluid.  Glenohumeral Joint: Negative.  Labrum:  Intact.

## 2018-09-21 NOTE — Patient Instructions (Signed)
Surgery for Rotator Cuff Tear  Rotator cuff surgery is only recommended for individuals who have experienced persistent disability for greater than 3 months of non-surgical (conservative) treatment. Surgery is not necessary but is recommended for individuals who experience difficulty completing daily activities or athletes who are unable to compete. Rotator cuff tears do not usually heal without surgical intervention. If left alone small rotator cuff tears usually become larger. Younger athletes who have a rotator cuff tear may be recommended for surgery without attempting conservative rehabilitation. The purpose of surgery is to regain function of the shoulder joint and eliminate pain associated with the injury. In addition to repairing the tendon tear, the surgery may often remove a portion of the bony roof of the shoulder (acromion) as well as the chronically thickened and inflamed membrane below the acromion (subacromial bursa). REASONS NOT TO OPERATE  Infection of the shoulder. Inability to complete a rehabilitation program. Patients who have other conditions (emotional or psychological) conditions that contribute to their shoulder condition. RISKS AND COMPLICATIONS Infection. Re-tear of the rotator cuff tendons or muscles. Shoulder stiffness and/or weakness. Inability to compete in athletics. Acromioclavicular (AC) joint pain Risks of surgery: infection, bleeding, nerve damage, or damage to surrounding tissues. TECHNIQUE There are different surgical procedures used to treat rotator cuff tears. The type of procedure depends on the extent of injury as well as the surgeon's preference. All of the surgical techniques for rotator cuff tears have the same goal of repairing the torn tendon, removing part of the acromion, and removing the subacromial bursa. There are two main types of procedures: arthroscopic and open incision. Arthroscopic procedures are usually completed and you go home the same day  as surgery (outpatient). These procedures use multiple small incisions in which tools and a video camera are placed to work on the shoulder. An electric shaver removes the bursa, then a power burr shaves down the portion of the acromion that places pressure on the rotator cuff. Finally the rotator cuff is sewed (sutured) back to the humeral head. Open incision procedures require a larger incision. The deltoid muscle is detached from the acromion and a ligament in the shoulder (coracoacromial) is cut in order for the surgeon to access the rotator cuff. The subacromial bursa is removed as well as part of the acromion to give the rotator cuff room to move freely. The torn tendon is then sutured to the humeral head. After the rotator cuff is repaired, then the deltoid is reattached and the incision is closed up.  RECOVERY  Post-operative care depends on the surgical technique and the preferences of your therapist. Keep the wound clean and dry for the first 10 to 14 days after surgery. Keep your shoulder and arm in the sling provided to you for as long as you have been instructed to. You will be given pain medications by your caregiver. Passive (without using muscles) shoulder movements may be begun when instructed. It is important to follow through with you rehabilitation program in order to have the best possible recovery. RETURN TO SPORTS  The rehabilitation period will depend on the sport and position you play as well as the success of the operation. The minimum recovery period is 6 months. You must have regained complete shoulder motion and strength before returning to sports. SEEK IMMEDIATE MEDICAL CARE IF:  Any medications produce adverse side effects. Any complications from surgery occur: Pain, numbness, or coldness in the extremity operated upon. Discoloration of the nail beds (they become blue or gray) of   the extremity operated upon. Signs of infections (fever, pain, inflammation, redness, or  persistent bleeding).   You have decided to proceed with rotator cuff repair surgery. You have decided not to continue with nonoperative measures such as but not limited to oral medication,   activity modification, physical therapy, bracing, or injection.  We will perform rotator cuff repair. Some of the risks associated with rotator cuff repair include but are not limited to Bleeding Infection Swelling Stiffness Blood clot Pain Re-tearing of the rotator cuff Failure of the rotator cuff to heal   If you're not comfortable with these risks and would like to continue with nonoperative treatment please let Dr. Harrison know prior to your surgery.  

## 2018-10-05 ENCOUNTER — Emergency Department (HOSPITAL_COMMUNITY): Payer: Medicare Other

## 2018-10-05 ENCOUNTER — Emergency Department (HOSPITAL_COMMUNITY)
Admission: EM | Admit: 2018-10-05 | Discharge: 2018-10-05 | Disposition: A | Discharge status: Home / Self Care | Payer: Medicare Other | Attending: Emergency Medicine | Admitting: Emergency Medicine

## 2018-10-05 ENCOUNTER — Encounter (HOSPITAL_COMMUNITY): Payer: Self-pay | Admitting: Emergency Medicine

## 2018-10-05 ENCOUNTER — Other Ambulatory Visit: Payer: Self-pay

## 2018-10-05 DIAGNOSIS — M545 Low back pain, unspecified: Secondary | ICD-10-CM

## 2018-10-05 DIAGNOSIS — Y999 Unspecified external cause status: Secondary | ICD-10-CM | POA: Diagnosis not present

## 2018-10-05 DIAGNOSIS — S3992XA Unspecified injury of lower back, initial encounter: Secondary | ICD-10-CM | POA: Diagnosis not present

## 2018-10-05 DIAGNOSIS — Y939 Activity, unspecified: Secondary | ICD-10-CM | POA: Diagnosis not present

## 2018-10-05 DIAGNOSIS — W010XXA Fall on same level from slipping, tripping and stumbling without subsequent striking against object, initial encounter: Secondary | ICD-10-CM | POA: Diagnosis not present

## 2018-10-05 DIAGNOSIS — Z87891 Personal history of nicotine dependence: Secondary | ICD-10-CM | POA: Insufficient documentation

## 2018-10-05 DIAGNOSIS — M25561 Pain in right knee: Secondary | ICD-10-CM | POA: Insufficient documentation

## 2018-10-05 DIAGNOSIS — Z79899 Other long term (current) drug therapy: Secondary | ICD-10-CM | POA: Diagnosis not present

## 2018-10-05 DIAGNOSIS — M25551 Pain in right hip: Secondary | ICD-10-CM | POA: Diagnosis not present

## 2018-10-05 DIAGNOSIS — M25511 Pain in right shoulder: Secondary | ICD-10-CM | POA: Diagnosis not present

## 2018-10-05 DIAGNOSIS — Y92512 Supermarket, store or market as the place of occurrence of the external cause: Secondary | ICD-10-CM | POA: Diagnosis not present

## 2018-10-05 DIAGNOSIS — W19XXXA Unspecified fall, initial encounter: Secondary | ICD-10-CM

## 2018-10-05 DIAGNOSIS — M25461 Effusion, right knee: Secondary | ICD-10-CM | POA: Diagnosis not present

## 2018-10-05 DIAGNOSIS — S79911A Unspecified injury of right hip, initial encounter: Secondary | ICD-10-CM | POA: Diagnosis not present

## 2018-10-05 DIAGNOSIS — S4991XA Unspecified injury of right shoulder and upper arm, initial encounter: Secondary | ICD-10-CM | POA: Diagnosis not present

## 2018-10-05 NOTE — Discharge Instructions (Signed)
1. Medications: take tylenol for pain control, usual home medications 2. Treatment: rest, ice, elevate, drink plenty of fluids, gentle stretching 3. Follow Up: Please follow up with orthopedics as directed or your PCP in 1 week if no improvement for discussion of your diagnoses and further evaluation after today's visit; if you do not have a primary care doctor use the resource guide provided to find one; Please return to the ER for worsening symptoms or other concerns

## 2018-10-05 NOTE — ED Provider Notes (Signed)
  Face-to-face evaluation   History: She complains of injuries, when she fell at [employer], 2 days ago.  She is able to walk.  She has pre-existing problems with her right shoulder, and plans an upcoming surgery.  Currently she is complaining of pain in the right shoulder, low back, and right knee.  She is ambulating using a cane now because she has pain in her right knee.  She had another knee injury about 2 months ago and saw orthopedics at that time.  Physical exam: Elderly female.  She is able to walk but limps because of pain in the right knee.  Right knee is not swollen, or unstable.  Mild tenderness of the right shoulder without swelling, deformity or significant limitation of motion.  Patient does not exhibit back pain, while walking.   Medical screening examination/treatment/procedure(s) were conducted as a shared visit with non-physician practitioner(s) and myself.  I personally evaluated the patient during the encounter    Daleen Bo, MD 10/06/18 1740

## 2018-10-05 NOTE — ED Provider Notes (Signed)
[facility] EMERGENCY DEPARTMENT Provider Note   CSN: [id number] Arrival date & time: 10/05/18  1651    History   Chief Complaint Chief Complaint  Patient presents with  . Back Pain    HPI Kathryn Woods is a 68 y.o. female with a PMH of chronic low back pain, IBS, Migraines, and Shoulder Bursitis presenting after a fall yesterday. Patient reports she slipped on wet ground at [employer] and slid down the floor. Patient denies hitting her head or LOC. Patient reports non radiating back pain that she describes as an ache. Patient reports right shoulder pain and right knee pain. Patient is unsure if pain is the same or different than the pain she has had prior. Patient reports she is scheduled to have a rotator cuff repair on the right shoulder in July. Patient reports pain is worse with movement and better with rest. Patient denies taking any medications for her symptoms. Patient denies numbness, tingling, weakness, incontinence to bowel/bladder, fever, chills, IV drug use, or hx of cancer. Patient denies fever, cough, congestion, dysuria, sick contacts, or recent travel.       HPI  Past Medical History:  Diagnosis Date  . Arthritis    lumbar region, spondylolisthesis     Patient Active Problem List   Diagnosis Date Noted  . Spondylolisthesis at L3-L4 level 12/10/2016  . Bursitis, shoulder 02/24/2013  . HYPERLIPIDEMIA 02/04/2007  . WRIST SPRAIN, LEFT 02/04/2007  . MIGRAINE HEADACHE 01/15/2007  . BRONCHITIS, CHRONIC NOS 01/15/2007  . CONSTIPATION 01/15/2007  . IBS 01/15/2007  . DISC DISEASE, LUMBOSACRAL SPINE 01/15/2007  . LOW BACK PAIN, CHRONIC 01/15/2007  . MALAISE AND FATIGUE 01/15/2007    Past Surgical History:  Procedure Laterality Date  . BACK SURGERY    . TUBAL LIGATION       OB History   No obstetric history on file.      Home Medications    Prior to Admission medications   Medication Sig Start Date End Date Taking? Authorizing Provider   HYDROcodone-acetaminophen (NORCO/VICODIN) 5-325 MG tablet Take 1 tablet by mouth every 6 (six) hours as needed for moderate pain.    [provider]  vitamin B-12 (CYANOCOBALAMIN) 1000 MCG tablet Take 1,000 mcg by mouth daily.    [provider]    Family History Family History  Problem Relation Age of Onset  . Heart attack Mother   . Cancer Father        lung    Social History Social History   Tobacco Use  . Smoking status: Former Smoker    Quit date: 12/06/1982    Years since quitting: 35.8  . Smokeless tobacco: Never Used  Substance Use Topics  . Alcohol use: No  . Drug use: Yes    Types: Marijuana    Comment: a couple times- for pain relief, pt. informed to not use anymore before surgery      Allergies   Aspirin   Review of Systems Review of Systems  Constitutional: Negative for activity change, chills, diaphoresis, fever and unexpected weight change.  HENT: Negative for congestion, rhinorrhea and sore throat.   Eyes: Negative for visual disturbance.  Respiratory: Negative for cough and shortness of breath.   Cardiovascular: Negative for chest pain, palpitations and leg swelling.  Gastrointestinal: Negative for abdominal pain, constipation, diarrhea, nausea and vomiting.  Genitourinary: Negative for difficulty urinating, dysuria, flank pain and hematuria.  Musculoskeletal: Positive for arthralgias and back pain. Negative for gait problem, joint swelling, myalgias, neck  pain and neck stiffness.  Skin: Negative for rash.  Allergic/Immunologic: Negative for immunocompromised state.  Neurological: Negative for dizziness, tremors, seizures, syncope, facial asymmetry, speech difficulty, weakness, light-headedness, numbness and headaches.  Hematological: Does not bruise/bleed easily.     Physical Exam Updated Vital Signs BP (!) 148/90 (BP Location: Right Arm)   Pulse 99   Temp 98.5 F (36.9 C) (Temporal)   Resp 18   SpO2 99%   Physical Exam  Vitals signs and nursing note reviewed.  Constitutional:      General: She is not in acute distress.    Appearance: She is well-developed. She is not diaphoretic.  HENT:     Head: Normocephalic and atraumatic.     Right Ear: Tympanic membrane and ear canal normal.     Left Ear: Tympanic membrane, ear canal and external ear normal.     Nose: Nose normal.     Mouth/Throat:     Mouth: Mucous membranes are moist.     Pharynx: No posterior oropharyngeal erythema.  Eyes:     Extraocular Movements: Extraocular movements intact.     Conjunctiva/sclera: Conjunctivae normal.     Pupils: Pupils are equal, round, and reactive to light.  Neck:     Musculoskeletal: Normal range of motion.  Cardiovascular:     Rate and Rhythm: Normal rate and regular rhythm.     Heart sounds: Normal heart sounds. No murmur. No friction rub. No gallop.   Pulmonary:     Effort: Pulmonary effort is normal. No respiratory distress.     Breath sounds: Normal breath sounds. No wheezing or rales.  Abdominal:     Palpations: Abdomen is soft.     Tenderness: There is no abdominal tenderness.  Musculoskeletal:     Right shoulder: She exhibits decreased range of motion, tenderness and bony tenderness. She exhibits no swelling and no deformity.     Right hip: She exhibits decreased range of motion, tenderness and bony tenderness. She exhibits normal strength and no swelling.     Left hip: Normal. She exhibits normal range of motion, normal strength, no tenderness and no bony tenderness.     Right knee: She exhibits decreased range of motion. She exhibits no swelling, no effusion, no ecchymosis, no deformity, no laceration, no erythema, normal alignment, no LCL laxity, no bony tenderness, normal meniscus and no MCL laxity. Tenderness found. LCL tenderness noted. No medial joint line, no lateral joint line, no MCL and no patellar tendon tenderness noted.     Left knee: Normal. She exhibits normal range of motion, no swelling, no  effusion and no ecchymosis. No tenderness found.     Right ankle: Normal. She exhibits normal range of motion, no swelling, no ecchymosis and no deformity. No tenderness.     Cervical back: Normal. She exhibits normal range of motion, no tenderness and no bony tenderness.     Thoracic back: She exhibits normal range of motion, no tenderness and no bony tenderness.     Lumbar back: She exhibits decreased range of motion, tenderness and bony tenderness. She exhibits no swelling, no edema and no deformity.     Comments: No skin changes noted. Midline lumbar spine tenderness to palpation.  Tenderness to palpation diffusely over right shoulder. Lateral right knee tenderness to palpation. Decreased ROM of lumbar spine, right hip, and right knee. Decreased ROM of right shoulder. Negative anterior/posterior drawer of right knee. No ligament laxity noted on right knee. 5/5 strength in upper and lower extremities. 2+ DP  and radial pulses. Sensation intact. Patient is able to ambulate with a cane at this time.  Skin:    General: Skin is warm.     Findings: No erythema or rash.  Neurological:     Mental Status: She is alert.    ED Treatments / Results  Labs (all labs ordered are listed, but only abnormal results are displayed) Labs Reviewed - No data to display  EKG None  Radiology Dg Lumbar Spine Complete  Result Date: 10/05/2018 CLINICAL DATA:  Initial evaluation for acute trauma, fall. EXAM: LUMBAR SPINE - COMPLETE 4+ VIEW COMPARISON:  Prior radiograph from 01/04/2018 FINDINGS: Vertebral bodies normally aligned with preservation of the normal lumbar lordosis. No listhesis or subluxation. Vertebral body height maintained without evidence for acute or interval fracture. Minimal anterior wedging at the superior endplate of L1 is unchanged. Visualized sacrum and pelvis intact. Prior PLIF at L3-4 without adverse features. Moderate degenerative intervertebral disc space narrowing at L5-S1, stable. No acute  soft tissue abnormality. IMPRESSION: 1. No radiographic evidence for acute traumatic injury within the lumbar spine. 2. Prior PLIF at L3-4 without adverse features. Electronically Signed   By: Rise MuBenjamin  McClintock M.D.   On: 10/05/2018 18:27   Dg Shoulder Right  Result Date: 10/05/2018 CLINICAL DATA:  Initial evaluation for acute trauma, fall. EXAM: RIGHT SHOULDER - 2+ VIEW COMPARISON:  None. FINDINGS: No acute fracture or dislocation. Humeral head in normal alignment with the glenoid. AC joint approximated. No appreciable soft tissue injury. Visualized right hemithorax grossly clear. IMPRESSION: No acute osseous abnormality about the right shoulder. Electronically Signed   By: Rise MuBenjamin  McClintock M.D.   On: 10/05/2018 18:20   Dg Knee Complete 4 Views Right  Result Date: 10/05/2018 CLINICAL DATA:  Initial evaluation for acute trauma, fall. EXAM: RIGHT KNEE - COMPLETE 4+ VIEW COMPARISON:  Prior radiograph from 08/03/2018. FINDINGS: No acute fracture or dislocation. Small to moderate joint effusion present within the suprapatellar recess. Moderate osteoarthritic changes present about the knee, most notable at the lateral femorotibial joint space compartment. Osseous mineralization normal. No acute soft tissue abnormality. IMPRESSION: 1. No acute fracture or dislocation. 2. Small to moderate joint effusion. 3. Moderate degenerative osteoarthrosis. Electronically Signed   By: Rise MuBenjamin  McClintock M.D.   On: 10/05/2018 18:23   Dg Hip Unilat With Pelvis 2-3 Views Right  Result Date: 10/05/2018 CLINICAL DATA:  Initial evaluation for acute trauma, fall. Right posterior head pain. EXAM: DG HIP (WITH OR WITHOUT PELVIS) 2-3V RIGHT COMPARISON:  None. FINDINGS: No acute fracture or dislocation. Femoral head in normal alignment within the acetabulum. Femoral head height maintained. Bony pelvis intact. SI joints approximated. Moderate osteoarthritic changes about the hips bilaterally, right slightly worse than left.  Sequelae of prior fusion partially visualized L3-4. Degenerative spondylolysis noted within the lower lumbar spine. No acute soft tissue abnormality. IMPRESSION: 1. No acute osseous abnormality about the right hip. 2. Moderate degenerative osteoarthritic changes about the hips bilaterally, right slightly worse than left. Electronically Signed   By: Rise MuBenjamin  McClintock M.D.   On: 10/05/2018 18:30    Procedures Procedures (including critical care time)  Medications Ordered in ED Medications - No data to display   Initial Impression / Assessment and Plan / ED Course  I have reviewed the triage vital signs and the nursing notes.  Pertinent labs & imaging results that were available during my care of the patient were reviewed by me and considered in my medical decision making (see chart for details).  Clinical Course as  of Oct 05 2015  Mon Oct 05, 2018  1823 No acute osseous abnormality about the right shoulder.  DG Shoulder Right [AH]  1834 1. No radiographic evidence for acute traumatic injury within the lumbar spine. 2. Prior PLIF at L3-4 without adverse features.    DG Lumbar Spine Complete [AH]  1834 1. No acute fracture or dislocation. 2. Small to moderate joint effusion. 3. Moderate degenerative osteoarthrosis.    DG Knee Complete 4 Views Right [AH]  1835 1. No acute osseous abnormality about the right hip. 2. Moderate degenerative osteoarthritic changes about the hips bilaterally, right slightly worse than left.    DG HIP UNILAT WITH PELVIS 2-3 VIEWS RIGHT [AH]    Clinical Course User Index [AH] Arville Lime, PA-C      Patient presents after a mechanical fall. X rays without acute osseous abnormalities. No signs of other injury present. Pain has been managed in the ER without medications per patient's request. Patient is stable in no acute distress. Advised patient to follow up with orthopedics and PCP. Patient is able to ambulate with minimal discomfort. Discussed  return precautions. Patient states he understands and agrees with plan.   Findings and plan of care discussed with supervising physician Dr. Eulis Foster who personally evaluated and examined this patient.  Final Clinical Impressions(s) / ED Diagnoses   Final diagnoses:  Fall  Acute midline low back pain without sciatica  Acute pain of right shoulder  Acute pain of right knee  Right hip pain    ED Discharge Orders    None       Arville Lime, [place] 10/05/18 2017    Daleen Bo, MD 10/06/18 1740

## 2018-10-05 NOTE — ED Triage Notes (Signed)
Pt slipped in [employer] yesterday.  Never fell. Pt now c/o of back pain, right leg pain and right arm pain.

## 2018-10-05 NOTE — Patient Instructions (Signed)
Kathryn Woods  10/05/2018     @   Your procedure is scheduled on  10/15/2018  Report to [facility] at  615  A.M.  Call this number if you have problems the morning of surgery:  [phone]   Remember:  Do not eat or drink after midnight.                        Take these medicines the morning of surgery with A SIP OF WATER  Hydrocodoen(if needed).    Do not wear jewelry, make-up or nail polish.  Do not wear lotions, powders, or perfumes, or deodorant.  Do not shave 48 hours prior to surgery.  Men may shave face and neck.  Do not bring valuables to the hospital.  [facility] is not responsible for any belongings or valuables.  Contacts, dentures or bridgework may not be worn into surgery.  Leave your suitcase in the car.  After surgery it may be brought to your room.  For patients admitted to the hospital, discharge time will be determined by your treatment team.  Patients discharged the day of surgery will not be allowed to drive home.   Name and phone number of your driver:   family Special instructions:  None  Please read over the following fact sheets that you were given. Anesthesia Post-op Instructions and Care and Recovery After Surgery       Surgery for Rotator Cuff Tear, Care After This sheet gives you information about how to care for yourself after your procedure. Your health care provider may also give you more specific instructions. If you have problems or questions, contact your health care provider. What can I expect after the procedure? After the procedure, it is common to have:  Swelling.  Pain.  Stiffness.  Tenderness. Follow these instructions at home: If you have a sling or a shoulder immobilizer:  Wear it as told by your health care provider. Remove it only as told by your health care provider.  Loosen it if your fingers tingle, become numb, or turn cold and blue.  Keep it clean. Bathing  Do not  take baths, swim, or use a hot tub until your health care provider approves. Ask your health care provider if you may take showers. You may only be allowed to take sponge baths.  Keep your bandage (dressing) dry until your health care provider says it can be removed.  If your sling or shoulder immobilizer is not waterproof: ? Do not let it get wet. ? Remove it when you take a bath or shower as told by your health care provider. Once the sling or shoulder immobilizer is removed, try not to move your shoulder until your health care provider says that you can. Incision care   Follow instructions from your health care provider about how to take care of your incision. Make sure you: ? Wash your hands with soap and water before and after you change your dressing. If soap and water are not available, use hand sanitizer. ? Change your dressing as told by your health care provider. ? Leave stitches (sutures), skin glue, or adhesive strips in place. These skin closures may need to stay in place for 2 weeks or longer. If adhesive strip edges start to loosen and curl up, you may trim the loose edges. Do not remove adhesive strips completely unless your health care provider tells you to  do that.  Check your incision area every day for signs of infection. Check for: ? More redness, swelling, or pain. ? More fluid or blood. ? Warmth. ? Pus or a bad smell. Managing pain, stiffness, and swelling   If directed, put ice on your shoulder area. ? Put ice in a plastic bag. ? Place a towel between your skin and the bag. ? Leave the ice on for 20 minutes, 2-3 times a day.  Move your fingers often to reduce stiffness and swelling.  Raise (elevate) your upper body on pillows when you lie down and when you sleep. ? Do not sleep on the front of your body (abdomen). ? Do not sleep on the side that your surgery was performed on. Medicines  Take over-the-counter and prescription medicines only as told by your  health care provider.  Ask your health care provider if the medicine prescribed to you: ? Requires you to avoid driving or using heavy machinery. ? Can cause constipation. You may need to take actions to prevent or treat constipation, such as:  Drink enough fluid to keep your urine pale yellow.  Take over-the-counter or prescription medicines.  Eat foods that are high in fiber, such as beans, whole grains, and fresh fruits and vegetables.  Limit foods that are high in fat and processed sugars, such as fried or sweet foods. Driving  Do not drive for 24 hours if you were given a sedative during your procedure.  Do not drive while wearing a sling or a shoulder immobilizer. Ask your health care provider when it is safe to drive. Activity  Do not use your arm to support your body weight until your health care provider says that you can.  Do not lift or hold anything with your arm until your health care provider approves.  Return to your normal activities as told by your health care provider. Ask your health care provider what activities are safe for you.  Do exercises as told by your health care provider. General instructions  Do not use any products that contain nicotine or tobacco, such as cigarettes, e-cigarettes, and chewing tobacco. These can delay healing after surgery. If you need help quitting, ask your health care provider.  Keep all follow-up visits as told by your health care provider. This is important. Contact a health care provider if:  You have a fever.  You have more redness, swelling, or pain around your incision.  You have more fluid or blood coming from your incision.  Your incision feels warm to the touch.  You have pus or a bad smell coming from your incision.  You have pain that gets worse or does not get better with medicine. Get help right away if:  You have severe pain.  You lose feeling in your arm or hand.  Your hand or fingers turn very pale  or blue. Summary  If you have a sling, wear it as told by your health care provider. Remove it only as told by your health care provider.  Change your dressing as told by your health care provider. Check the incision area every day for signs of infection.  If directed, put ice on your shoulder area 2-3 times a day.  Do not use your arm to lift anything or to support your body weight until your health care provider says that you can. This information is not intended to replace advice given to you by your health care provider. Make sure you discuss any questions you  have with your health care provider. Document Released: 03/25/2005 Document Revised: 12/29/2017 Document Reviewed: 12/31/2017 Elsevier Patient Education  2020 [employer]  General Anesthesia, Adult, Care After This sheet gives you information about how to care for yourself after your procedure. Your health care provider may also give you more specific instructions. If you have problems or questions, contact your health care provider. What can I expect after the procedure? After the procedure, the following side effects are common:  Pain or discomfort at the IV site.  Nausea.  Vomiting.  Sore throat.  Trouble concentrating.  Feeling cold or chills.  Weak or tired.  Sleepiness and fatigue.  Soreness and body aches. These side effects can affect parts of the body that were not involved in surgery. Follow these instructions at home:  For at least 24 hours after the procedure:  Have a responsible adult stay with you. It is important to have someone help care for you until you are awake and alert.  Rest as needed.  Do not: ? Participate in activities in which you could fall or become injured. ? Drive. ? Use heavy machinery. ? Drink alcohol. ? Take sleeping pills or medicines that cause drowsiness. ? Make important decisions or sign legal documents. ? Take care of children on your own. Eating and drinking   Follow any instructions from your health care provider about eating or drinking restrictions.  When you feel hungry, start by eating small amounts of foods that are soft and easy to digest (bland), such as toast. Gradually return to your regular diet.  Drink enough fluid to keep your urine pale yellow.  If you vomit, rehydrate by drinking water, juice, or clear broth. General instructions  If you have sleep apnea, surgery and certain medicines can increase your risk for breathing problems. Follow instructions from your health care provider about wearing your sleep device: ? Anytime you are sleeping, including during daytime naps. ? While taking prescription pain medicines, sleeping medicines, or medicines that make you drowsy.  Return to your normal activities as told by your health care provider. Ask your health care provider what activities are safe for you.  Take over-the-counter and prescription medicines only as told by your health care provider.  If you smoke, do not smoke without supervision.  Keep all follow-up visits as told by your health care provider. This is important. Contact a health care provider if:  You have nausea or vomiting that does not get better with medicine.  You cannot eat or drink without vomiting.  You have pain that does not get better with medicine.  You are unable to pass urine.  You develop a skin rash.  You have a fever.  You have redness around your IV site that gets worse. Get help right away if:  You have difficulty breathing.  You have chest pain.  You have blood in your urine or stool, or you vomit blood. Summary  After the procedure, it is common to have a sore throat or nausea. It is also common to feel tired.  Have a responsible adult stay with you for the first 24 hours after general anesthesia. It is important to have someone help care for you until you are awake and alert.  When you feel hungry, start by eating small amounts  of foods that are soft and easy to digest (bland), such as toast. Gradually return to your regular diet.  Drink enough fluid to keep your urine pale yellow.  Return to your  normal activities as told by your health care provider. Ask your health care provider what activities are safe for you. This information is not intended to replace advice given to you by your health care provider. Make sure you discuss any questions you have with your health care provider. Document Released: 07/01/2000 Document Revised: 03/28/2017 Document Reviewed: 11/08/2016 Elsevier Patient Education  2020 [employer]. How to Use Chlorhexidine for Bathing Chlorhexidine gluconate (CHG) is a germ-killing (antiseptic) solution that is used to clean the skin. It can get rid of the bacteria that normally live on the skin and can keep them away for about 24 hours. To clean your skin with CHG, you may be given:  A CHG solution to use in the shower or as part of a sponge bath.  A prepackaged cloth that contains CHG. Cleaning your skin with CHG may help lower the risk for infection:  While you are staying in the intensive care unit of the hospital.  If you have a vascular access, such as a central line, to provide short-term or long-term access to your veins.  If you have a catheter to drain urine from your bladder.  If you are on a ventilator. A ventilator is a machine that helps you breathe by moving air in and out of your lungs.  After surgery. What are the risks? Risks of using CHG include:  A skin reaction.  Hearing loss, if CHG gets in your ears.  Eye injury, if CHG gets in your eyes and is not rinsed out.  The CHG product catching fire. Make sure that you avoid smoking and flames after applying CHG to your skin. Do not use CHG:  If you have a chlorhexidine allergy or have previously reacted to chlorhexidine.  On babies younger than 53 months of age. How to use CHG solution  Use CHG only as told by your  health care provider, and follow the instructions on the label.  Use the full amount of CHG as directed. Usually, this is one bottle. During a shower Follow these steps when using CHG solution during a shower (unless your health care provider gives you different instructions): 1. Start the shower. 2. Use your normal soap and shampoo to wash your face and hair. 3. Turn off the shower or move out of the shower stream. 4. Pour the CHG onto a clean washcloth. Do not use any type of brush or rough-edged sponge. 5. Starting at your neck, lather your body down to your toes. Make sure you follow these instructions: ? If you will be having surgery, pay special attention to the part of your body where you will be having surgery. Scrub this area for at least 1 minute. ? Do not use CHG on your head or face. If the solution gets into your ears or eyes, rinse them well with water. ? Avoid your genital area. ? Avoid any areas of skin that have broken skin, cuts, or scrapes. ? Scrub your back and under your arms. Make sure to wash skin folds. 6. Let the lather sit on your skin for 1-2 minutes or as long as told by your health care provider. 7. Thoroughly rinse your entire body in the shower. Make sure that all body creases and crevices are rinsed well. 8. Dry off with a clean towel. Do not put any substances on your body afterward-such as powder, lotion, or perfume-unless you are told to do so by your health care provider. Only use lotions that are recommended by  the manufacturer. 9. Put on clean clothes or pajamas. 10. If it is the night before your surgery, sleep in clean sheets.  During a sponge bath Follow these steps when using CHG solution during a sponge bath (unless your health care provider gives you different instructions): 1. Use your normal soap and shampoo to wash your face and hair. 2. Pour the CHG onto a clean washcloth. 3. Starting at your neck, lather your body down to your toes. Make sure  you follow these instructions: ? If you will be having surgery, pay special attention to the part of your body where you will be having surgery. Scrub this area for at least 1 minute. ? Do not use CHG on your head or face. If the solution gets into your ears or eyes, rinse them well with water. ? Avoid your genital area. ? Avoid any areas of skin that have broken skin, cuts, or scrapes. ? Scrub your back and under your arms. Make sure to wash skin folds. 4. Let the lather sit on your skin for 1-2 minutes or as long as told by your health care provider. 5. Using a different clean, wet washcloth, thoroughly rinse your entire body. Make sure that all body creases and crevices are rinsed well. 6. Dry off with a clean towel. Do not put any substances on your body afterward-such as powder, lotion, or perfume-unless you are told to do so by your health care provider. Only use lotions that are recommended by the manufacturer. 7. Put on clean clothes or pajamas. 8. If it is the night before your surgery, sleep in clean sheets. How to use CHG prepackaged cloths  Only use CHG cloths as told by your health care provider, and follow the instructions on the label.  Use the CHG cloth on clean, dry skin.  Do not use the CHG cloth on your head or face unless your health care provider tells you to.  When washing with the CHG cloth: ? Avoid your genital area. ? Avoid any areas of skin that have broken skin, cuts, or scrapes. Before surgery Follow these steps when using a CHG cloth to clean before surgery (unless your health care provider gives you different instructions): 1. Using the CHG cloth, vigorously scrub the part of your body where you will be having surgery. Scrub using a back-and-forth motion for 3 minutes. The area on your body should be completely wet with CHG when you are done scrubbing. 2. Do not rinse. Discard the cloth and let the area air-dry. Do not put any substances on the area afterward,  such as powder, lotion, or perfume. 3. Put on clean clothes or pajamas. 4. If it is the night before your surgery, sleep in clean sheets.  For general bathing Follow these steps when using CHG cloths for general bathing (unless your health care provider gives you different instructions). 1. Use a separate CHG cloth for each area of your body. Make sure you wash between any folds of skin and between your fingers and toes. Wash your body in the following order, switching to a new cloth after each step: ? The front of your neck, shoulders, and chest. ? Both of your arms, under your arms, and your hands. ? Your stomach and groin area, avoiding the genitals. ? Your right leg and foot. ? Your left leg and foot. ? The back of your neck, your back, and your buttocks. 2. Do not rinse. Discard the cloth and let the area air-dry. Do  not put any substances on your body afterward-such as powder, lotion, or perfume-unless you are told to do so by your health care provider. Only use lotions that are recommended by the manufacturer. 3. Put on clean clothes or pajamas. Contact a health care provider if:  Your skin gets irritated after scrubbing.  You have questions about using your solution or cloth. Get help right away if:  Your eyes become very red or swollen.  Your eyes itch badly.  Your skin itches badly and is red or swollen.  Your hearing changes.  You have trouble seeing.  You have swelling or tingling in your mouth or throat.  You have trouble breathing.  You swallow any chlorhexidine. Summary  Chlorhexidine gluconate (CHG) is a germ-killing (antiseptic) solution that is used to clean the skin. Cleaning your skin with CHG may help to lower your risk for infection.  You may be given CHG to use for bathing. It may be in a bottle or in a prepackaged cloth to use on your skin. Carefully follow your health care provider's instructions and the instructions on the product label.  Do not  use CHG if you have a chlorhexidine allergy.  Contact your health care provider if your skin gets irritated after scrubbing. This information is not intended to replace advice given to you by your health care provider. Make sure you discuss any questions you have with your health care provider. Document Released: 12/18/2011 Document Revised: 06/11/2018 Document Reviewed: 02/20/2017 Elsevier Patient Education  2020 [employer].

## 2018-10-08 DIAGNOSIS — M25551 Pain in right hip: Secondary | ICD-10-CM | POA: Diagnosis not present

## 2018-10-08 DIAGNOSIS — W19XXXD Unspecified fall, subsequent encounter: Secondary | ICD-10-CM | POA: Diagnosis not present

## 2018-10-08 DIAGNOSIS — M545 Low back pain: Secondary | ICD-10-CM | POA: Diagnosis not present

## 2018-10-08 DIAGNOSIS — M25561 Pain in right knee: Secondary | ICD-10-CM | POA: Diagnosis not present

## 2018-10-12 ENCOUNTER — Other Ambulatory Visit: Payer: Self-pay

## 2018-10-12 ENCOUNTER — Encounter (HOSPITAL_COMMUNITY)
Admission: RE | Admit: 2018-10-12 | Discharge: 2018-10-12 | Disposition: A | Discharge status: Home / Self Care | Payer: Medicare Other | Source: Ambulatory Visit | Attending: Orthopedic Surgery | Admitting: Orthopedic Surgery

## 2018-10-12 ENCOUNTER — Other Ambulatory Visit (HOSPITAL_COMMUNITY)
Admission: RE | Admit: 2018-10-12 | Discharge: 2018-10-12 | Disposition: A | Discharge status: Home / Self Care | Payer: Medicare Other | Source: Ambulatory Visit | Attending: Orthopedic Surgery | Admitting: Orthopedic Surgery

## 2018-10-12 DIAGNOSIS — M479 Spondylosis, unspecified: Secondary | ICD-10-CM | POA: Diagnosis not present

## 2018-10-12 DIAGNOSIS — Z87891 Personal history of nicotine dependence: Secondary | ICD-10-CM | POA: Diagnosis not present

## 2018-10-12 DIAGNOSIS — M75101 Unspecified rotator cuff tear or rupture of right shoulder, not specified as traumatic: Secondary | ICD-10-CM | POA: Diagnosis not present

## 2018-10-12 DIAGNOSIS — M7551 Bursitis of right shoulder: Secondary | ICD-10-CM | POA: Diagnosis not present

## 2018-10-12 DIAGNOSIS — M65811 Other synovitis and tenosynovitis, right shoulder: Secondary | ICD-10-CM | POA: Diagnosis not present

## 2018-10-12 DIAGNOSIS — Z1159 Encounter for screening for other viral diseases: Secondary | ICD-10-CM | POA: Diagnosis not present

## 2018-10-12 LAB — CBC WITH DIFFERENTIAL/PLATELET
Abs Immature Granulocytes: 0.01 10*3/uL (ref 0.00–0.07)
Basophils Absolute: 0 10*3/uL (ref 0.0–0.1)
Basophils Relative: 0 %
Eosinophils Absolute: 0.7 10*3/uL — ABNORMAL HIGH (ref 0.0–0.5)
Eosinophils Relative: 9 %
HCT: 37.5 % (ref 36.0–46.0)
Hemoglobin: 11.9 g/dL — ABNORMAL LOW (ref 12.0–15.0)
Immature Granulocytes: 0 %
Lymphocytes Relative: 30 %
Lymphs Abs: 2.4 10*3/uL (ref 0.7–4.0)
MCH: 29.4 pg (ref 26.0–34.0)
MCHC: 31.7 g/dL (ref 30.0–36.0)
MCV: 92.6 fL (ref 80.0–100.0)
Monocytes Absolute: 0.7 10*3/uL (ref 0.1–1.0)
Monocytes Relative: 8 %
Neutro Abs: 4.4 10*3/uL (ref 1.7–7.7)
Neutrophils Relative %: 53 %
Platelets: 292 10*3/uL (ref 150–400)
RBC: 4.05 MIL/uL (ref 3.87–5.11)
RDW: 14.6 % (ref 11.5–15.5)
WBC: 8.2 10*3/uL (ref 4.0–10.5)
nRBC: 0 % (ref 0.0–0.2)

## 2018-10-12 LAB — BASIC METABOLIC PANEL
Anion gap: 9 (ref 5–15)
BUN: 11 mg/dL (ref 8–23)
CO2: 24 mmol/L (ref 22–32)
Calcium: 9 mg/dL (ref 8.9–10.3)
Chloride: 108 mmol/L (ref 98–111)
Creatinine, Ser: 0.87 mg/dL (ref 0.44–1.00)
GFR calc Af Amer: >60 mL/min (ref >60)
GFR calc non Af Amer: >60 mL/min (ref >60)
Glucose, Bld: 94 mg/dL (ref 70–99)
Potassium: 3.6 mmol/L (ref 3.5–5.1)
Sodium: 141 mmol/L (ref 135–145)

## 2018-10-13 LAB — SARS CORONAVIRUS 2 (TAT 6-24 HRS): SARS Coronavirus 2: NEGATIVE

## 2018-10-14 NOTE — H&P (Signed)
Chief Complaint  Patient presents with  . Shoulder Pain      Right shoulder pain since March   Preop history and physical for right shoulder surgery   History: This is a 68 year old female's been treated since March 2020 with right shoulder pain.  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  Because of COVID-19 surgery was postponed she went to physical therapy this therapy has made her shoulder pain worse.   She complains of moderate to severe nonradiating aching pain over the right shoulder painful range of motion crepitance trouble sleeping and weakness in abduction and flexion despite physical therapy and hydrocodone for pain   Review of Systems  Musculoskeletal: Positive for joint pain.  Neurological: Positive for focal weakness.  All other systems reviewed and are negative.         Past Medical History:  Diagnosis Date  . Arthritis      lumbar region, spondylolisthesis         Past Surgical History:  Procedure Laterality Date  . BACK SURGERY      . TUBAL LIGATION       Social History         Tobacco Use  . Smoking status: Former Smoker      Quit date: 12/06/1982      Years since quitting: 35.8  . Smokeless tobacco: Never Used  Substance Use Topics  . Alcohol use: No  . Drug use: Yes      Types: Marijuana      Comment: a couple times- for pain relief, pt. informed to not use anymore before surgery     Current Outpatient Medications:  .  HYDROcodone-acetaminophen (NORCO/VICODIN) 5-325 MG tablet, One tablet every six hours as needed for pain., Disp: 28 tablet, Rfl: 0     BP (!) 159/104   Pulse 68   Temp (!) 97 F (36.1 C)   Ht 5\' 6"  (1.676 m)   Wt 179 lb (81.2 kg)   BMI 28.89 kg/m  Physical Exam Vitals signs and nursing note reviewed.  Constitutional:      Appearance: Normal appearance.  Neurological:     Mental Status: She is alert and oriented to person, place, and  time.  Psychiatric:        Mood and Affect: Mood normal.      Cardiovascular findings normal pulse and perfusion both upper extremities without swelling or varicose veins   Lymph nodes in the cervical region are normal   Her skin is clean dry and intact without caf au lait spots ulcerations or nodules   She has excellent coordination of her upper extremities normal reflexes normal sensation   Her right shoulder is weak decreased range of motion severe crepitation on range of motion she is not contracted as her external rotation is normal she has weakness in abduction and flexion and mild weakness in external rotation with a stable shoulder muscle tone is normal     Medical decision making  surgery will be recommended for the patient arthroscopy right shoulder rotator cuff repair with a speed bridge construct   The procedure has been fully reviewed with the patient; The risks and benefits of surgery have been discussed and explained and understood. Alternative treatment has also been reviewed, questions were encouraged and answered. The postoperative plan is also been reviewed.    Diagnoses torn right rotator cuff      Image interpretation  The patient has chronic tendinopathy of the rotator cuff she has a full-thickness tear of the supraspinatus with retraction of 2-1/2 cm and a torn biceps tendon with further retraction and some arthritis and type II acromion near the [facility] joint   MRI right shoulder report   FINDINGS: Rotator cuff: The patient has rotator cuff tendinopathy. There is a full-thickness and near full width tear of the supraspinatus measuring approximately 1.6 cm from front to back. Retraction is to the top of the humeral head, 1.5-2.5 cm. The rotator cuff is otherwise intact.   Muscles:  No focal atrophy or lesion.   Biceps long head: Completely torn from the superior labrum and retracted 5-6 cm below the top of the humeral head.   Acromioclavicular Joint:  Moderate osteoarthritis. Type 2 acromion. Small volume of subacromial/subdeltoid fluid.   Glenohumeral Joint: Negative.   Labrum:  Intact.   Plan arthroscopic rotator cuff repair

## 2018-10-15 ENCOUNTER — Encounter (HOSPITAL_COMMUNITY)
Admission: RE | Disposition: A | Discharge status: Home / Self Care | Payer: Self-pay | Source: Home / Self Care | Attending: Orthopedic Surgery

## 2018-10-15 ENCOUNTER — Other Ambulatory Visit: Payer: Self-pay

## 2018-10-15 ENCOUNTER — Ambulatory Visit (HOSPITAL_COMMUNITY): Payer: Medicare Other | Admitting: Anesthesiology

## 2018-10-15 ENCOUNTER — Ambulatory Visit (HOSPITAL_COMMUNITY)
Admission: RE | Admit: 2018-10-15 | Discharge: 2018-10-15 | Disposition: A | Discharge status: Home / Self Care | Payer: Medicare Other | Attending: Orthopedic Surgery | Admitting: Orthopedic Surgery

## 2018-10-15 DIAGNOSIS — M75101 Unspecified rotator cuff tear or rupture of right shoulder, not specified as traumatic: Secondary | ICD-10-CM | POA: Insufficient documentation

## 2018-10-15 DIAGNOSIS — M7581 Other shoulder lesions, right shoulder: Secondary | ICD-10-CM | POA: Diagnosis not present

## 2018-10-15 DIAGNOSIS — M65911 Unspecified synovitis and tenosynovitis, right shoulder: Secondary | ICD-10-CM

## 2018-10-15 DIAGNOSIS — S46211D Strain of muscle, fascia and tendon of other parts of biceps, right arm, subsequent encounter: Secondary | ICD-10-CM

## 2018-10-15 DIAGNOSIS — M479 Spondylosis, unspecified: Secondary | ICD-10-CM | POA: Insufficient documentation

## 2018-10-15 DIAGNOSIS — M19011 Primary osteoarthritis, right shoulder: Secondary | ICD-10-CM | POA: Diagnosis not present

## 2018-10-15 DIAGNOSIS — G8918 Other acute postprocedural pain: Secondary | ICD-10-CM | POA: Diagnosis not present

## 2018-10-15 DIAGNOSIS — M7551 Bursitis of right shoulder: Secondary | ICD-10-CM | POA: Insufficient documentation

## 2018-10-15 DIAGNOSIS — M24111 Other articular cartilage disorders, right shoulder: Secondary | ICD-10-CM | POA: Diagnosis not present

## 2018-10-15 DIAGNOSIS — M65811 Other synovitis and tenosynovitis, right shoulder: Secondary | ICD-10-CM | POA: Insufficient documentation

## 2018-10-15 DIAGNOSIS — M75121 Complete rotator cuff tear or rupture of right shoulder, not specified as traumatic: Secondary | ICD-10-CM | POA: Diagnosis not present

## 2018-10-15 DIAGNOSIS — Z87891 Personal history of nicotine dependence: Secondary | ICD-10-CM | POA: Diagnosis not present

## 2018-10-15 DIAGNOSIS — Z1159 Encounter for screening for other viral diseases: Secondary | ICD-10-CM | POA: Diagnosis not present

## 2018-10-15 HISTORY — PX: SHOULDER ARTHROSCOPY WITH OPEN ROTATOR CUFF REPAIR: SHX6092

## 2018-10-15 SURGERY — ARTHROSCOPY, SHOULDER WITH REPAIR, ROTATOR CUFF, OPEN
Anesthesia: General | Site: Shoulder | Laterality: Right

## 2018-10-15 MED ORDER — METHOCARBAMOL 1000 MG/10ML IJ SOLN
500.0000 mg | Freq: Once | INTRAVENOUS | Status: AC
  Administered 2018-10-15: 500 mg via INTRAVENOUS
  Filled 2018-10-15: qty 5

## 2018-10-15 MED ORDER — MIDAZOLAM HCL 5 MG/5ML IJ SOLN
INTRAMUSCULAR | Status: DC | PRN
  Administered 2018-10-15: 1 mg via INTRAVENOUS

## 2018-10-15 MED ORDER — PHENYLEPHRINE 40 MCG/ML (10ML) SYRINGE FOR IV PUSH (FOR BLOOD PRESSURE SUPPORT)
PREFILLED_SYRINGE | INTRAVENOUS | Status: AC
  Filled 2018-10-15: qty 20

## 2018-10-15 MED ORDER — MIDAZOLAM HCL 2 MG/2ML IJ SOLN
INTRAMUSCULAR | Status: AC
  Filled 2018-10-15: qty 2

## 2018-10-15 MED ORDER — MIDAZOLAM HCL 2 MG/2ML IJ SOLN: 0.5000 mg | Freq: Once | INTRAMUSCULAR | Status: DC | PRN

## 2018-10-15 MED ORDER — EPHEDRINE SULFATE 50 MG/ML IJ SOLN
INTRAMUSCULAR | Status: DC | PRN
  Administered 2018-10-15 (×4): 5 mg via INTRAVENOUS

## 2018-10-15 MED ORDER — BUPIVACAINE LIPOSOME 1.3 % IJ SUSP
INTRAMUSCULAR | Status: DC | PRN
  Administered 2018-10-15: 20 mL

## 2018-10-15 MED ORDER — PREGABALIN 50 MG PO CAPS
50.0000 mg | ORAL_CAPSULE | Freq: Once | ORAL | Status: AC
  Administered 2018-10-15: 50 mg via ORAL
  Filled 2018-10-15: qty 1

## 2018-10-15 MED ORDER — SUGAMMADEX SODIUM 200 MG/2ML IV SOLN
INTRAVENOUS | Status: DC | PRN
  Administered 2018-10-15: 200 mg via INTRAVENOUS

## 2018-10-15 MED ORDER — PHENYLEPHRINE HCL (PRESSORS) 10 MG/ML IV SOLN
INTRAVENOUS | Status: DC | PRN
  Administered 2018-10-15 (×4): 100 ug via INTRAVENOUS
  Administered 2018-10-15: 80 ug via INTRAVENOUS
  Administered 2018-10-15: 40 ug via INTRAVENOUS
  Administered 2018-10-15: 100 ug via INTRAVENOUS

## 2018-10-15 MED ORDER — DEXAMETHASONE SODIUM PHOSPHATE 10 MG/ML IJ SOLN
INTRAMUSCULAR | Status: DC | PRN
  Administered 2018-10-15: 4 mg via INTRAVENOUS

## 2018-10-15 MED ORDER — SUCCINYLCHOLINE CHLORIDE 200 MG/10ML IV SOSY
PREFILLED_SYRINGE | INTRAVENOUS | Status: AC
  Filled 2018-10-15: qty 10

## 2018-10-15 MED ORDER — ONDANSETRON HCL 4 MG/2ML IJ SOLN
4.0000 mg | Freq: Once | INTRAMUSCULAR | Status: AC
  Administered 2018-10-15: 4 mg via INTRAVENOUS

## 2018-10-15 MED ORDER — CEFAZOLIN SODIUM-DEXTROSE 2-4 GM/100ML-% IV SOLN
2.0000 g | INTRAVENOUS | Status: AC
  Administered 2018-10-15: 2 g via INTRAVENOUS

## 2018-10-15 MED ORDER — FENTANYL CITRATE (PF) 100 MCG/2ML IJ SOLN
INTRAMUSCULAR | Status: DC | PRN
  Administered 2018-10-15 (×3): 50 ug via INTRAVENOUS

## 2018-10-15 MED ORDER — BUPIVACAINE-EPINEPHRINE 0.5% -1:200000 IJ SOLN
INTRAMUSCULAR | Status: DC | PRN
  Administered 2018-10-15: 20 mL

## 2018-10-15 MED ORDER — PROPOFOL 10 MG/ML IV BOLUS
INTRAVENOUS | Status: AC
  Filled 2018-10-15: qty 40

## 2018-10-15 MED ORDER — 0.9 % SODIUM CHLORIDE (POUR BTL) OPTIME
TOPICAL | Status: DC | PRN
  Administered 2018-10-15: 1000 mL

## 2018-10-15 MED ORDER — DEXAMETHASONE SODIUM PHOSPHATE 10 MG/ML IJ SOLN
INTRAMUSCULAR | Status: AC
  Filled 2018-10-15: qty 1

## 2018-10-15 MED ORDER — LIDOCAINE 2% (20 MG/ML) 5 ML SYRINGE
INTRAMUSCULAR | Status: AC
  Filled 2018-10-15: qty 10

## 2018-10-15 MED ORDER — EPHEDRINE 5 MG/ML INJ
INTRAVENOUS | Status: AC
  Filled 2018-10-15: qty 10

## 2018-10-15 MED ORDER — ROPIVACAINE HCL 5 MG/ML IJ SOLN
INTRAMUSCULAR | Status: AC
  Filled 2018-10-15: qty 30

## 2018-10-15 MED ORDER — EPINEPHRINE PF 1 MG/ML IJ SOLN
INTRAMUSCULAR | Status: AC
  Filled 2018-10-15: qty 8

## 2018-10-15 MED ORDER — HYDROCODONE-ACETAMINOPHEN 7.5-325 MG PO TABS: 1.0000 | ORAL_TABLET | Freq: Once | ORAL | Status: DC | PRN

## 2018-10-15 MED ORDER — ONDANSETRON HCL 4 MG/2ML IJ SOLN
INTRAMUSCULAR | Status: AC
  Filled 2018-10-15: qty 2

## 2018-10-15 MED ORDER — HYDROMORPHONE HCL 1 MG/ML IJ SOLN: 0.2500 mg | INTRAMUSCULAR | Status: DC | PRN

## 2018-10-15 MED ORDER — ONDANSETRON HCL 4 MG/2ML IJ SOLN
INTRAMUSCULAR | Status: DC | PRN
  Administered 2018-10-15: 4 mg via INTRAVENOUS

## 2018-10-15 MED ORDER — TIZANIDINE HCL 4 MG PO TABS: 4.0000 mg | ORAL_TABLET | Freq: Three times a day (TID) | ORAL | 1 refills | Status: DC

## 2018-10-15 MED ORDER — ONDANSETRON HCL 4 MG/2ML IJ SOLN
4.0000 mg | Freq: Once | INTRAMUSCULAR | Status: DC
  Filled 2018-10-15: qty 2

## 2018-10-15 MED ORDER — BUPIVACAINE-EPINEPHRINE (PF) 0.5% -1:200000 IJ SOLN
INTRAMUSCULAR | Status: AC
  Filled 2018-10-15: qty 30

## 2018-10-15 MED ORDER — BUPIVACAINE LIPOSOME 1.3 % IJ SUSP
INTRAMUSCULAR | Status: AC
  Filled 2018-10-15: qty 20

## 2018-10-15 MED ORDER — SODIUM CHLORIDE (PF) 0.9 % IJ SOLN
INTRAMUSCULAR | Status: AC
  Filled 2018-10-15: qty 10

## 2018-10-15 MED ORDER — LIDOCAINE HCL (CARDIAC) PF 100 MG/5ML IV SOSY
PREFILLED_SYRINGE | INTRAVENOUS | Status: DC | PRN
  Administered 2018-10-15: 40 mg via INTRAVENOUS

## 2018-10-15 MED ORDER — CEFAZOLIN SODIUM-DEXTROSE 2-4 GM/100ML-% IV SOLN
INTRAVENOUS | Status: AC
  Filled 2018-10-15: qty 100

## 2018-10-15 MED ORDER — KETOROLAC TROMETHAMINE 30 MG/ML IJ SOLN
INTRAMUSCULAR | Status: AC
  Filled 2018-10-15: qty 1

## 2018-10-15 MED ORDER — PROMETHAZINE HCL 25 MG/ML IJ SOLN: 6.2500 mg | INTRAMUSCULAR | Status: DC | PRN

## 2018-10-15 MED ORDER — ROCURONIUM BROMIDE 100 MG/10ML IV SOLN
INTRAVENOUS | Status: DC | PRN
  Administered 2018-10-15: 50 mg via INTRAVENOUS

## 2018-10-15 MED ORDER — SODIUM CHLORIDE 0.9 % IR SOLN
Status: DC | PRN
  Administered 2018-10-15 (×6): 3000 mL

## 2018-10-15 MED ORDER — LACTATED RINGERS IV SOLN
INTRAVENOUS | Status: DC | PRN
  Administered 2018-10-15: 08:00:00 via INTRAVENOUS

## 2018-10-15 MED ORDER — LACTATED RINGERS IV SOLN
INTRAVENOUS | Status: DC
  Administered 2018-10-15: 10:00:00 via INTRAVENOUS

## 2018-10-15 MED ORDER — PROPOFOL 10 MG/ML IV BOLUS
INTRAVENOUS | Status: DC | PRN
  Administered 2018-10-15: 150 mg via INTRAVENOUS
  Administered 2018-10-15 (×2): 50 mg via INTRAVENOUS

## 2018-10-15 MED ORDER — CHLORHEXIDINE GLUCONATE 4 % EX LIQD: 60.0000 mL | Freq: Once | CUTANEOUS | Status: DC

## 2018-10-15 MED ORDER — PROMETHAZINE HCL 12.5 MG PO TABS: 12.5000 mg | ORAL_TABLET | Freq: Four times a day (QID) | ORAL | 0 refills | Status: DC | PRN

## 2018-10-15 MED ORDER — ROCURONIUM BROMIDE 10 MG/ML (PF) SYRINGE
PREFILLED_SYRINGE | INTRAVENOUS | Status: AC
  Filled 2018-10-15: qty 10

## 2018-10-15 MED ORDER — FENTANYL CITRATE (PF) 250 MCG/5ML IJ SOLN
INTRAMUSCULAR | Status: AC
  Filled 2018-10-15: qty 5

## 2018-10-15 MED ORDER — HYDROCODONE-ACETAMINOPHEN 7.5-325 MG PO TABS: 1.0000 | ORAL_TABLET | ORAL | 0 refills | Status: DC | PRN

## 2018-10-15 MED ORDER — HYDROCODONE-ACETAMINOPHEN 7.5-325 MG PO TABS
1.0000 | ORAL_TABLET | Freq: Once | ORAL | Status: AC
  Administered 2018-10-15: 1 via ORAL
  Filled 2018-10-15: qty 1

## 2018-10-15 SURGICAL SUPPLY — 86 items
ANCH SUT SWLK 19.1X4.75 (Anchor) ×4 IMPLANT
ANCHOR SUT BIO SW 4.75X19.1 (Anchor) ×2 IMPLANT
APL PRP STRL LF DISP 70% ISPRP (MISCELLANEOUS) ×2
APL SKNCLS STERI-STRIP NONHPOA (GAUZE/BANDAGES/DRESSINGS) ×2
BENZOIN TINCTURE PRP APPL 2/3 (GAUZE/BANDAGES/DRESSINGS) ×3 IMPLANT
BIT DRILL 2.0X128 (BIT) IMPLANT
BLADE 10 SAFETY STRL DISP (BLADE) ×6 IMPLANT
BLADE 11 SAFETY STRL DISP (BLADE) ×3 IMPLANT
BLADE HEX COATED 2.75 (ELECTRODE) ×3 IMPLANT
BNDG COHESIVE 4X5 TAN STRL (GAUZE/BANDAGES/DRESSINGS) ×3 IMPLANT
CANNULA DRILOCK 6.5X75 (CANNULA) ×1 IMPLANT
CHLORAPREP W/TINT 26 (MISCELLANEOUS) ×3 IMPLANT
CLOTH BEACON ORANGE TIMEOUT ST (SAFETY) ×3 IMPLANT
COVER LIGHT HANDLE STERIS (MISCELLANEOUS) ×10 IMPLANT
COVER WAND RF STERILE (DRAPES) ×1 IMPLANT
DECANTER SPIKE VIAL GLASS SM (MISCELLANEOUS) ×6 IMPLANT
DRAPE PROXIMA HALF (DRAPES) ×3 IMPLANT
DRAPE SHOULDER BEACH CHAIR (DRAPES) ×3 IMPLANT
DRAPE U-SHAPE 47X51 STRL (DRAPES) ×3 IMPLANT
DRESSING MEPILEX BORDER 6X8 (GAUZE/BANDAGES/DRESSINGS) ×2 IMPLANT
DRSG MEPILEX BORDER 4X8 (GAUZE/BANDAGES/DRESSINGS) ×3 IMPLANT
DRSG MEPILEX BORDER 6X8 (GAUZE/BANDAGES/DRESSINGS) ×3
ELECT REM PT RETURN 9FT ADLT (ELECTROSURGICAL) ×3
ELECTRODE REM PT RTRN 9FT ADLT (ELECTROSURGICAL) ×2 IMPLANT
FIBERSTICK 2 (SUTURE) IMPLANT
GLOVE BIO SURGEON STRL SZ7 (GLOVE) ×2 IMPLANT
GLOVE BIOGEL PI IND STRL 7.0 (GLOVE) ×2 IMPLANT
GLOVE BIOGEL PI INDICATOR 7.0 (GLOVE) ×3
GLOVE SKINSENSE NS SZ8.0 LF (GLOVE) ×1
GLOVE SKINSENSE STRL SZ8.0 LF (GLOVE) ×2 IMPLANT
GLOVE SS N UNI LF 8.5 STRL (GLOVE) ×3 IMPLANT
GOWN STRL REUS W/TWL LRG LVL3 (GOWN DISPOSABLE) ×10 IMPLANT
GOWN STRL REUS W/TWL XL LVL3 (GOWN DISPOSABLE) ×3 IMPLANT
IMMOBILIZER SHOULDER MED (ORTHOPEDIC SUPPLIES) ×1 IMPLANT
INST SET MINOR BONE (KITS) ×3 IMPLANT
IV NS IRRIG 3000ML ARTHROMATIC (IV SOLUTION) ×10 IMPLANT
KIT BLADEGUARD II DBL (SET/KITS/TRAYS/PACK) ×3 IMPLANT
KIT POSITION SHOULDER SCHLEI (MISCELLANEOUS) ×3 IMPLANT
KIT TURNOVER KIT A (KITS) ×3 IMPLANT
MANIFOLD NEPTUNE II (INSTRUMENTS) ×3 IMPLANT
MARKER SKIN DUAL TIP RULER LAB (MISCELLANEOUS) ×3 IMPLANT
NDL HYPO 21X1.5 SAFETY (NEEDLE) ×2 IMPLANT
NDL MAYO 6 CRC TAPER PT (NEEDLE) IMPLANT
NDL SCORPION (NEEDLE) IMPLANT
NDL SCORPION MULTI FIRE (NEEDLE) IMPLANT
NDL SPNL 18GX3.5 QUINCKE PK (NEEDLE) ×2 IMPLANT
NEEDLE HYPO 21X1.5 SAFETY (NEEDLE) ×3 IMPLANT
NEEDLE MAYO 6 CRC TAPER PT (NEEDLE) IMPLANT
NEEDLE SCORPION (NEEDLE) IMPLANT
NEEDLE SCORPION MULTI FIRE (NEEDLE) ×3 IMPLANT
NEEDLE SPNL 18GX3.5 QUINCKE PK (NEEDLE) ×3 IMPLANT
NS IRRIG 1000ML POUR BTL (IV SOLUTION) ×3 IMPLANT
PACK TOTAL JOINT (CUSTOM PROCEDURE TRAY) ×1 IMPLANT
PAD ARMBOARD 7.5X6 YLW CONV (MISCELLANEOUS) ×3 IMPLANT
PASSER SUT CAPTURE FIRST (SUTURE) IMPLANT
PENCIL HANDSWITCHING (ELECTRODE) ×3 IMPLANT
PROBE BIPOLAR ATHRO 135MM 90D (MISCELLANEOUS) ×3 IMPLANT
SET ARTHROSCOPY INST (INSTRUMENTS) ×3 IMPLANT
SET BASIN LINEN APH (SET/KITS/TRAYS/PACK) ×3 IMPLANT
SET TUBE SUCT SHAVER OUTFL 24K (TUBING) ×6 IMPLANT
SPONGE GAUZE 2X2 8PLY STRL LF (GAUZE/BANDAGES/DRESSINGS) ×3 IMPLANT
STOCKINETTE IMPERVIOUS LG (DRAPES) ×3 IMPLANT
STRIP CLOSURE SKIN 1/2X4 (GAUZE/BANDAGES/DRESSINGS) ×1 IMPLANT
SUT BONE WAX W31G (SUTURE) IMPLANT
SUT ETHIBOND NAB OS 4 #2 30IN (SUTURE) IMPLANT
SUT ETHILON 3 0 FSL (SUTURE) ×1 IMPLANT
SUT FIBERWIRE #2 38 REV NDL BL (SUTURE) ×3
SUT FIBERWIRE #2 38 T-5 BLUE (SUTURE)
SUT LASSO 45 DEGREE (SUTURE) IMPLANT
SUT LASSO 45 DEGREE LEFT (SUTURE) IMPLANT
SUT LASSO 45D RIGHT (SUTURE) IMPLANT
SUT MON AB 0 CT1 (SUTURE) ×3 IMPLANT
SUT MON AB 2-0 CT1 36 (SUTURE) IMPLANT
SUT PROLENE 2 0 FS (SUTURE) IMPLANT
SUT PROLENE 2 0 SH 30 (SUTURE) IMPLANT
SUT PROLENE 3 0 PS 1 (SUTURE) IMPLANT
SUT VIC AB 1 CT1 27 (SUTURE)
SUT VIC AB 1 CT1 27XBRD ANTBC (SUTURE) IMPLANT
SUTURE FIBERWR #2 38 T-5 BLUE (SUTURE) IMPLANT
SUTURE FIBERWR#2 38 REV NDL BL (SUTURE) IMPLANT
SYR 30ML LL (SYRINGE) ×3 IMPLANT
SYR BULB IRRIGATION 50ML (SYRINGE) ×5 IMPLANT
TAPE FIBER 2MM 7IN #2 BLUE (SUTURE) ×2 IMPLANT
TOWEL OR 17X26 4PK STRL BLUE (TOWEL DISPOSABLE) ×3 IMPLANT
YANKAUER SUCT 12FT TUBE ARGYLE (SUCTIONS) ×3 IMPLANT
YANKAUER SUCT BULB TIP 10FT TU (MISCELLANEOUS) ×6 IMPLANT

## 2018-10-15 NOTE — Anesthesia Postprocedure Evaluation (Signed)
Anesthesia Post Note  Patient: Kathryn Woods  Procedure(s) Performed: SHOULDER ARTHROSCOPY WITH OPEN ROTATOR CUFF REPAIR (Right Shoulder)  Patient location during evaluation: PACU Anesthesia Type: General Level of consciousness: awake and alert, patient cooperative and oriented Pain management: pain level controlled Vital Signs Assessment: post-procedure vital signs reviewed and stable Respiratory status: spontaneous breathing, respiratory function stable, nonlabored ventilation and patient connected to face mask oxygen Cardiovascular status: stable Postop Assessment: no apparent nausea or vomiting Anesthetic complications: no     Last Vitals:  Vitals:   10/15/18 0704 10/15/18 0739  BP: (!) 151/98 (!) 158/100  Pulse: 69 67  Resp:    Temp:    SpO2: 95% 92%    Last Pain:  Vitals:   10/15/18 0650  TempSrc: Oral  PainSc: 7                  Robbi Spells

## 2018-10-15 NOTE — Transfer of Care (Signed)
Immediate Anesthesia Transfer of Care Note  Patient: Kathryn Woods  Procedure(s) Performed: SHOULDER ARTHROSCOPY WITH OPEN ROTATOR CUFF REPAIR (Right Shoulder)  Patient Location: PACU  Anesthesia Type:General  Level of Consciousness: awake, alert  and oriented  Airway & Oxygen Therapy: Patient Spontanous Breathing and Patient connected to face mask oxygen  Post-op Assessment: Report given to RN and Post -op Vital signs reviewed and stable  Post vital signs: Reviewed and stable  Last Vitals:  Vitals Value Taken Time  BP 135/89 10/15/18 1000  Temp    Pulse 80 10/15/18 1003  Resp 16 10/15/18 1003  SpO2 99 % 10/15/18 1003  Vitals shown include unvalidated device data.  Last Pain:  Vitals:   10/15/18 0650  TempSrc: Oral  PainSc: 7       Patients Stated Pain Goal: 10 (61/44/31 5400)  Complications: No apparent anesthesia complications

## 2018-10-15 NOTE — Interval H&P Note (Signed)
History and Physical Interval Note:  10/15/2018 7:15 AM  Kathryn Woods  has presented today for surgery, with the diagnosis of TORN ROTATOR CUFF RIGHT / [place].  The various methods of treatment have been discussed with the patient and family. After consideration of risks, benefits and other options for treatment, the patient has consented to  Procedure(s): ARTHROSCOPIC ROTATOR CUFF REPAIR WITH  DEBRIDEMENT OF BICEPS RIGHT (Right) as a surgical intervention.  The patient's history has been reviewed, patient examined, no change in status, stable for surgery.  I have reviewed the patient's chart and labs.  Questions were answered to the patient's satisfaction.     Arther Abbott

## 2018-10-15 NOTE — Anesthesia Preprocedure Evaluation (Addendum)
Anesthesia Evaluation  Patient identified by MRN, date of birth, ID band Patient awake    Reviewed: Allergy & Precautions, NPO status , Patient's Chart, lab work & pertinent test results  Airway Mallampati: II  TM Distance: >3 FB Neck ROM: Full    Dental no notable dental hx. (+) Edentulous Upper, Edentulous Lower   Pulmonary neg pulmonary ROS, former smoker,    Pulmonary exam normal breath sounds clear to auscultation       Cardiovascular Exercise Tolerance: Good negative cardio ROS Normal cardiovascular examI Rhythm:Regular Rate:Normal     Neuro/Psych  Headaches, negative psych ROS   GI/Hepatic negative GI ROS, Neg liver ROS,   Endo/Other  negative endocrine ROS  Renal/GU Renal InsufficiencyRenal disease  negative genitourinary   Musculoskeletal  (+) Arthritis , Osteoarthritis,    Abdominal   Peds negative pediatric ROS (+)  Hematology negative hematology ROS (+) anemia ,   Anesthesia Other Findings   Reproductive/Obstetrics negative OB ROS                             Anesthesia Physical Anesthesia Plan  ASA: II  Anesthesia Plan: General   Post-op Pain Management: GA combined w/ Regional for post-op pain   Induction: Intravenous  PONV Risk Score and Plan: 3 and Treatment may vary due to age or medical condition, Ondansetron and Dexamethasone  Airway Management Planned: Oral ETT  Additional Equipment:   Intra-op Plan:   Post-operative Plan: Extubation in OR  Informed Consent: I have reviewed the patients History and Physical, chart, labs and discussed the procedure including the risks, benefits and alternatives for the proposed anesthesia with the patient or authorized representative who has indicated his/her understanding and acceptance.     Dental advisory given  Plan Discussed with: CRNA  Anesthesia Plan Comments: (Plan Full PPE use  Plan GETA with R ISB for POPM  -PSR-WTP with same )       Anesthesia Quick Evaluation

## 2018-10-15 NOTE — Anesthesia Procedure Notes (Signed)
Procedure Name: Intubation Date/Time: 10/15/2018 7:54 AM Performed by: Jonna Munro, CRNA Pre-anesthesia Checklist: Patient identified, Emergency Drugs available, Suction available, Patient being monitored and Timeout performed Patient Re-evaluated:Patient Re-evaluated prior to induction Oxygen Delivery Method: Circle system utilized Preoxygenation: Pre-oxygenation with 100% oxygen Induction Type: IV induction Ventilation: Mask ventilation without difficulty Laryngoscope Size: Mac and 3 Grade View: Grade I Tube type: Oral Tube size: 7.0 mm Number of attempts: 1 Airway Equipment and Method: Stylet Placement Confirmation: ETT inserted through vocal cords under direct vision,  positive ETCO2 and breath sounds checked- equal and bilateral Secured at: 22 cm Tube secured with: Tape Dental Injury: Teeth and Oropharynx as per pre-operative assessment

## 2018-10-15 NOTE — Brief Op Note (Addendum)
10/15/2018  9:51 AM  PATIENT:  Kathryn Woods  68 y.o. female  PRE-OPERATIVE DIAGNOSIS:  TORN ROTATOR CUFF RIGHT / BICEPS TEAR RIGHT SHOULDER  POST-OPERATIVE DIAGNOSIS:  TORN ROTATOR CUFF RIGHT / BICEPS TEAR RIGHT SHOULDER  Operative findings Glenohumeral joint  Mild arthrosis with 2 areas of chondral defect grade 3 on the glenoid side humeral head was normal Degenerative tearing of the labrum anteriorly and posteriorly Synovitis Torn biceps tendon with retraction  Subacromial space Extensive bursitis rotator cuff tear with retraction just past the midpoint of the humeral head 2.5 cm but easily reducible with external rotation of the arm Rotator cuff tear front to back 2 cm Acromion spurring  PROCEDURE:  Procedure(s): SHOULDER ARTHROSCOPY LIMITED DEBRIDEMENT 29 822, WITH OPEN ROTATOR CUFF REPAIR [id number] (Right)  SURGEON:  Surgeon(s) and Role:    Vickki Hearing* Harrison, Stanley E, MD - Primary  Details of surgery  Preop: The patient was seen in preop the chart was reviewed and the surgical site was confirmed and marked his right shoulder.  interscalene block was placed  Surgical suite: The patient was taken to the operating room Ancef was started she was intubated.  She was placed in the beachchair position.  Preop range of motion was normal.  After sterile prep and drape timeout was completed  -The posterior portal was made and the scope was placed in the joint.  Diagnostic arthroscopy was performed.  An anterior portal was established and a shaver blade was placed into the anterior portal above the subscapularis and the rim remnants of the biceps tendon were debrided the anterior labrum and posterior labrum were debrided the glenohumeral joint on the glenoid side was debrided for chondral flaps.  Synovitis was removed.  The cuff was evaluated and found to be a complete tear of the supraspinatus tendon  The axillary pouch was normal  -The scope was then placed in the subacromial  space and lateral portal was established and a bursectomy was performed.  The anterior cannula was placed into the subacromial space.  After cleaning out the bursal tissue and acromioplasty was performed.  The greater tuberosity was then debrided.  The cuff was then assessed for repair.  The tissue grasper was used to confirm reduction of the cuff with external rotation of the arm.  We passed 3 sutures 1 FiberWire 2 fiber tapes.  I then passed them out the anterolateral portal.  Extended the anterior portal to a mini open incision.  I split the deltoid.  I used finger dissection to free up the sub-deltoid tissue.  I then used a suture passer to place 2 of the sutures in an inverted mattress fashion.  And then I used a swivel lock anchors to anchor the inverted mattress sutures in a crisscross fashion.  This gave an excellent repair I used the 2 accessory sutures and the swivel lock anchor to repair dogears anteriorly and posteriorly.  I irrigated the wound I examined the repair it was excellent with a good footprint and complete coverage of the humeral head there was no tension on the repair with the arm at the side  I injected the exparel.  I repaired the deltoid with an inverted running 0 Monocryl suture I then placed 2-0 Monocryl sutures in the subcu tissue and then used a 2-0 Monocryl to close the subcutaneous tissue along with Steri-Strips.  I used 3-0 nylon to close the 3 portals  I placed a sterile dressing and a sling and then the patient was extubated and  taken to recovery room in stable condition   PHYSICIAN ASSISTANT:   ASSISTANTS: Simonne Maffucci  ANESTHESIA: Preop interscalene block and general anesthesia EBL:  none   BLOOD ADMINISTERED:none  DRAINS: none   LOCAL MEDICATIONS USED:  MARCAINE   30 cc and 20 cc of exparel undiluted  SPECIMEN:  No Specimen  DISPOSITION OF SPECIMEN:  N/A  COUNTS:  YES  DICTATION: .Dragon Dictation  PLAN OF CARE: Discharge to home after  PACU  PATIENT DISPOSITION:  PACU - hemodynamically stable.   Delay start of Pharmacological VTE agent (>24hrs) due to surgical blood loss or risk of bleeding: not applicable

## 2018-10-15 NOTE — Discharge Instructions (Signed)
Ice your shoulder 30 minutes every 2 hours while you are awake  Try to sleep sitting upright in a recliner for 5 to 7 days  You can remove your sling for changing your close.  No shower until you see the doctor.  When you take the sling off keep your arm touching your body do not move it away from your body or try to lift anything with your right arm.  At mealtime remove your sling keep your arm close to your body and let your elbow straighten completely out.  Leave the sling off for 30 minutes.            General Anesthesia, Adult, Care After This sheet gives you information about how to care for yourself after your procedure. Your health care provider may also give you more specific instructions. If you have problems or questions, contact your health care provider. What can I expect after the procedure? After the procedure, the following side effects are common:  Pain or discomfort at the IV site.  Nausea.  Vomiting.  Sore throat.  Trouble concentrating.  Feeling cold or chills.  Weak or tired.  Sleepiness and fatigue.  Soreness and body aches. These side effects can affect parts of the body that were not involved in surgery. Follow these instructions at home:  For at least 24 hours after the procedure:  Have a responsible adult stay with you. It is important to have someone help care for you until you are awake and alert.  Rest as needed.  Do not: ? Participate in activities in which you could fall or become injured. ? Drive. ? Use heavy machinery. ? Drink alcohol. ? Take sleeping pills or medicines that cause drowsiness. ? Make important decisions or sign legal documents. ? Take care of children on your own. Eating and drinking  Follow any instructions from your health care provider about eating or drinking restrictions.  When you feel hungry, start by eating small amounts of foods that are soft and easy to digest (bland), such as toast. Gradually  return to your regular diet.  Drink enough fluid to keep your urine pale yellow.  If you vomit, rehydrate by drinking water, juice, or clear broth. General instructions  If you have sleep apnea, surgery and certain medicines can increase your risk for breathing problems. Follow instructions from your health care provider about wearing your sleep device: ? Anytime you are sleeping, including during daytime naps. ? While taking prescription pain medicines, sleeping medicines, or medicines that make you drowsy.  Return to your normal activities as told by your health care provider. Ask your health care provider what activities are safe for you.  Take over-the-counter and prescription medicines only as told by your health care provider.  If you smoke, do not smoke without supervision.  Keep all follow-up visits as told by your health care provider. This is important. Contact a health care provider if:  You have nausea or vomiting that does not get better with medicine.  You cannot eat or drink without vomiting.  You have pain that does not get better with medicine.  You are unable to pass urine.  You develop a skin rash.  You have a fever.  You have redness around your IV site that gets worse. Get help right away if:  You have difficulty breathing.  You have chest pain.  You have blood in your urine or stool, or you vomit blood. Summary  After the procedure, it is common  to have a sore throat or nausea. It is also common to feel tired.  Have a responsible adult stay with you for the first 24 hours after general anesthesia. It is important to have someone help care for you until you are awake and alert.  When you feel hungry, start by eating small amounts of foods that are soft and easy to digest (bland), such as toast. Gradually return to your regular diet.  Drink enough fluid to keep your urine pale yellow.  Return to your normal activities as told by your health care  provider. Ask your health care provider what activities are safe for you. This information is not intended to replace advice given to you by your health care provider. Make sure you discuss any questions you have with your health care provider. Document Released: 07/01/2000 Document Revised: 03/28/2017 Document Reviewed: 11/08/2016 Elsevier Patient Education  2020 [employer].

## 2018-10-15 NOTE — Anesthesia Procedure Notes (Signed)
Anesthesia Regional Block: Interscalene brachial plexus block   Pre-Anesthetic Checklist: ,, timeout performed, Correct Patient, Correct Site, Correct Laterality, Correct Procedure, Correct Position, site marked, Risks and benefits discussed, at surgeon's request and post-op pain management  Laterality: Upper and Right  Prep: chloraprep       Needles:  Injection technique: Single-shot  Needle Type: Stimiplex     Needle Length: 4cm  Needle Gauge: 22     Additional Needles:   Procedures:,,,, ultrasound used (permanent image in chart),,,,   Nerve Stimulator or Paresthesia:  Response: Twitch elicited, 0.5 mA, 0.3 ms,   Additional Responses:   Narrative:  Start time: 10/15/2018 7:26 AM End time: 10/15/2018 7:36 AM  Performed by: Personally  Anesthesiologist: Lenice Llamas, MD  Additional Notes: Block assessed prior to start of surgery Using U/S -unable to print pictures-  Sitting in preop, + mon , 30cc of 0.5% ropiv  Plain  3-5 cc incremental injections, no POI, one slight parasthesia which resolved immediately  Tolerated well  To OR after block  +relief

## 2018-10-15 NOTE — Op Note (Addendum)
10/15/2018  9:51 AM  PATIENT:  Kathryn Woods  68 y.o. female  PRE-OPERATIVE DIAGNOSIS:  TORN ROTATOR CUFF RIGHT / BICEPS TEAR RIGHT SHOULDER  POST-OPERATIVE DIAGNOSIS:  TORN ROTATOR CUFF RIGHT / BICEPS TEAR RIGHT SHOULDER  Operative findings Glenohumeral joint  Mild arthrosis with 2 areas of chondral defect grade 3 on the glenoid side humeral head was normal Degenerative tearing of the labrum anteriorly and posteriorly Synovitis Torn biceps tendon with retraction  Subacromial space Extensive bursitis rotator cuff tear with retraction just past the midpoint of the humeral head 2.5 cm but easily reducible with external rotation of the arm Rotator cuff tear front to back 2 cm Acromion spurring  PROCEDURE:  Procedure(s): SHOULDER ARTHROSCOPY LIMITED DEBRIDEMENT 29 822, WITH OPEN ROTATOR CUFF REPAIR [id number] (Right)  SURGEON:  Surgeon(s) and Role:    Carole Civil, MD - Primary  Implants Arthrex fiber tape times 2/ FiberWire x2 Westley Chandler lock x2 Details of surgery  Preop: The patient was seen in preop the chart was reviewed and the surgical site was confirmed and marked his right shoulder.  Interscalene  block was placed  Surgical suite: The patient was taken to the operating room Ancef was started she was intubated.  She was placed in the beachchair position.  Preop range of motion was normal.  After sterile prep and drape timeout was completed  -The posterior portal was made and the scope was placed in the joint.  Diagnostic arthroscopy was performed.  An anterior portal was established and a shaver blade was placed into the anterior portal above the subscapularis and the rim remnants of the biceps tendon were debrided the anterior labrum and posterior labrum were debrided the glenohumeral joint on the glenoid side was debrided for chondral flaps.  Synovitis was removed.  The cuff was evaluated and found to be a complete tear of the supraspinatus tendon  The axillary  pouch was normal  -The scope was then placed in the subacromial space and lateral portal was established and a bursectomy was performed.  The anterior cannula was placed into the subacromial space.  After cleaning out the bursal tissue and acromioplasty was performed.  The greater tuberosity was then debrided.  The cuff was then assessed for repair.  The tissue grasper was used to confirm reduction of the cuff with external rotation of the arm.  We passed 3 sutures 1 FiberWire 2 fiber tapes.  I then passed them out the anterolateral portal.  Extended the anterior portal to a mini open incision.  I split the deltoid.  I used finger dissection to free up the sub-deltoid tissue.  I then used a suture passer to place 2 of the sutures in an inverted mattress fashion.  And then I used a swivel lock anchors to anchor the inverted mattress sutures in a crisscross fashion.  This gave an excellent repair I used the 2 accessory sutures and the swivel lock anchor to repair dogears anteriorly and posteriorly.  I irrigated the wound I examined the repair it was excellent with a good footprint and complete coverage of the humeral head there was no tension on the repair with the arm at the side  I injected the exparel.  I repaired the deltoid with an inverted running 0 Monocryl suture I then placed 2-0 Monocryl sutures in the subcu tissue and then used a 2-0 Monocryl to close the subcutaneous tissue along with Steri-Strips.  I used 3-0 nylon to close the 3 portals  I placed a  sterile dressing and a sling and then the patient was extubated and taken to recovery room in stable condition   PHYSICIAN ASSISTANT:   ASSISTANTS: [place] NationBetty Ashley  ANESTHESIA: Preop interscalene block and general anesthesia EBL:  none   BLOOD ADMINISTERED:none  DRAINS: none   LOCAL MEDICATIONS USED:  MARCAINE   30 cc and 20 cc of exparel undiluted  SPECIMEN:  No Specimen  DISPOSITION OF SPECIMEN:  N/A  COUNTS:   YES  DICTATION: .Dragon Dictation  PLAN OF CARE: Discharge to home after PACU  PATIENT DISPOSITION:  PACU - hemodynamically stable.   Delay start of Pharmacological VTE agent (>24hrs) due to surgical blood loss or risk of bleeding: not applicable

## 2018-10-16 ENCOUNTER — Encounter (HOSPITAL_COMMUNITY): Payer: Self-pay | Admitting: Orthopedic Surgery

## 2018-10-20 ENCOUNTER — Telehealth: Payer: Self-pay | Admitting: Orthopedic Surgery

## 2018-10-20 NOTE — Telephone Encounter (Signed)
Yes, she is using Tizanidine  She states she does not think the cramping is from surgery, thinks it is from falling in [employer] before the surgery.

## 2018-10-20 NOTE — Telephone Encounter (Signed)
Post operative bandage stays on until appointment. She complains of cramping, I have advised her to use ice and to make sure she relaxes her shoulder in the sling and to let the sling hold her shoulder  She voiced understanding, to you FYI only

## 2018-10-20 NOTE — Telephone Encounter (Signed)
I have her on xanaflex make sure she is taking it

## 2018-10-20 NOTE — Telephone Encounter (Signed)
Kathryn Woods called this morning stating she had surgery last week.  She wanted to know if she could take the bandage off or does she need to leave it on until her appointment?  She said her discharge summary did not tell her.  I told her that I would have to get a message to Dr. Aline Brochure as he was not in the office at this time.  I told her that someone from the office would call her back.

## 2018-10-23 ENCOUNTER — Other Ambulatory Visit: Payer: Self-pay

## 2018-10-23 ENCOUNTER — Ambulatory Visit (INDEPENDENT_AMBULATORY_CARE_PROVIDER_SITE_OTHER): Payer: Medicare Other | Admitting: Orthopedic Surgery

## 2018-10-23 ENCOUNTER — Encounter: Payer: Self-pay | Admitting: Orthopedic Surgery

## 2018-10-23 VITALS — Temp 97.2°F | Ht 66.0 in | Wt 172.0 lb

## 2018-10-23 DIAGNOSIS — Z9889 Other specified postprocedural states: Secondary | ICD-10-CM

## 2018-10-23 DIAGNOSIS — G8918 Other acute postprocedural pain: Secondary | ICD-10-CM

## 2018-10-23 MED ORDER — HYDROCODONE-ACETAMINOPHEN 7.5-325 MG PO TABS: 1.0000 | ORAL_TABLET | ORAL | 0 refills | Status: DC | PRN

## 2018-10-23 NOTE — Progress Notes (Signed)
POSTOP VISIT  POD # 8  Chief Complaint  Patient presents with  . Routine Post Op    Rt RCR 10/15/18    Status post cuff repair visit today to remove sutures in order therapy  Patient is having continued pain in the shoulder very sore  Her wounds look clean dry and intact portal sutures were removed absorbable stitch for mini open repair was normal  10/15/2018  9:51 AM  PATIENT:  Kathryn Woods  68 y.o. female  PRE-OPERATIVE DIAGNOSIS:  Reform / BICEPS TEAR RIGHT SHOULDER  POST-OPERATIVE DIAGNOSIS:  TORN ROTATOR CUFF RIGHT / BICEPS TEAR RIGHT SHOULDER  Operative findings Glenohumeral joint  Mild arthrosis with 2 areas of chondral defect grade 3 on the glenoid side humeral head was normal Degenerative tearing of the labrum anteriorly and posteriorly Synovitis Torn biceps tendon with retraction  Subacromial space Extensive bursitis rotator cuff tear with retraction just past the midpoint of the humeral head 2.5 cm but easily reducible with external rotation of the arm Rotator cuff tear front to back 2 cm Acromion spurring  PROCEDURE:  Procedure(s): SHOULDER ARTHROSCOPY LIMITED DEBRIDEMENT 29 822, WITH OPEN ROTATOR CUFF REPAIR [id number] (Right)  SURGEON:  Surgeon(s) and Role:    Carole Civil, MD - Primary     Encounter Diagnoses  Name Primary?  . Post-operative pain Yes  . Status post rotator cuff repair      Postoperative plan (Work, [employer],  Meds ordered this encounter  Medications  . HYDROcodone-acetaminophen (NORCO) 7.5-325 MG tablet    Sig: Take 1 tablet by mouth every 4 (four) hours as needed for moderate pain.    Dispense:  42 tablet    Refill:  0  ,FU)  Start therapy follow-up in 4 weeks

## 2018-10-23 NOTE — Patient Instructions (Signed)
Shower ok   Sling 2 more weeks   Start therapy   You can take the sling off for eating, bathing  but keep the arm close to the body

## 2018-10-26 DIAGNOSIS — R202 Paresthesia of skin: Secondary | ICD-10-CM | POA: Diagnosis not present

## 2018-10-26 DIAGNOSIS — M25461 Effusion, right knee: Secondary | ICD-10-CM | POA: Diagnosis not present

## 2018-10-26 DIAGNOSIS — M5441 Lumbago with sciatica, right side: Secondary | ICD-10-CM | POA: Diagnosis not present

## 2018-10-26 DIAGNOSIS — M25561 Pain in right knee: Secondary | ICD-10-CM | POA: Diagnosis not present

## 2018-10-26 DIAGNOSIS — M5442 Lumbago with sciatica, left side: Secondary | ICD-10-CM | POA: Diagnosis not present

## 2018-11-05 ENCOUNTER — Other Ambulatory Visit (HOSPITAL_COMMUNITY): Payer: Self-pay | Admitting: Family Medicine

## 2018-11-05 ENCOUNTER — Other Ambulatory Visit: Payer: Self-pay | Admitting: Family Medicine

## 2018-11-05 DIAGNOSIS — M5442 Lumbago with sciatica, left side: Secondary | ICD-10-CM

## 2018-11-05 DIAGNOSIS — M545 Low back pain, unspecified: Secondary | ICD-10-CM

## 2018-11-05 DIAGNOSIS — M5441 Lumbago with sciatica, right side: Secondary | ICD-10-CM

## 2018-11-09 DIAGNOSIS — M545 Low back pain: Secondary | ICD-10-CM | POA: Diagnosis not present

## 2018-11-09 DIAGNOSIS — M25561 Pain in right knee: Secondary | ICD-10-CM | POA: Diagnosis not present

## 2018-11-09 DIAGNOSIS — T148XXD Other injury of unspecified body region, subsequent encounter: Secondary | ICD-10-CM | POA: Diagnosis not present

## 2018-11-11 ENCOUNTER — Other Ambulatory Visit: Payer: Self-pay

## 2018-11-11 ENCOUNTER — Telehealth: Payer: Self-pay | Admitting: Orthopedic Surgery

## 2018-11-11 ENCOUNTER — Ambulatory Visit (HOSPITAL_COMMUNITY)
Admission: RE | Admit: 2018-11-11 | Discharge: 2018-11-11 | Disposition: A | Discharge status: Home / Self Care | Payer: Medicare Other | Source: Ambulatory Visit | Attending: Family Medicine | Admitting: Family Medicine

## 2018-11-11 DIAGNOSIS — M5442 Lumbago with sciatica, left side: Secondary | ICD-10-CM | POA: Diagnosis not present

## 2018-11-11 DIAGNOSIS — M545 Low back pain, unspecified: Secondary | ICD-10-CM

## 2018-11-11 DIAGNOSIS — M5441 Lumbago with sciatica, right side: Secondary | ICD-10-CM | POA: Insufficient documentation

## 2018-11-11 NOTE — Telephone Encounter (Signed)
Patient called following visit with her primary care provider, Denyce Robert at [facility]; said she will be referring her for new problem of knee pain. Patient aware of her current post op care and next scheduled appointment with Dr Aline Brochure 11/20/18. Chart notes indicate MRI of lumbar spine. Awaiting referral.

## 2018-11-20 ENCOUNTER — Ambulatory Visit (INDEPENDENT_AMBULATORY_CARE_PROVIDER_SITE_OTHER): Payer: Medicare Other | Admitting: Orthopedic Surgery

## 2018-11-20 ENCOUNTER — Other Ambulatory Visit: Payer: Self-pay

## 2018-11-20 VITALS — BP 139/93 | HR 76 | Temp 97.0°F | Ht 66.0 in | Wt 171.0 lb

## 2018-11-20 DIAGNOSIS — Z9889 Other specified postprocedural states: Secondary | ICD-10-CM

## 2018-11-20 DIAGNOSIS — G8918 Other acute postprocedural pain: Secondary | ICD-10-CM

## 2018-11-20 MED ORDER — HYDROCODONE-ACETAMINOPHEN 7.5-325 MG PO TABS: 1.0000 | ORAL_TABLET | ORAL | 0 refills | Status: DC | PRN

## 2018-11-20 NOTE — Progress Notes (Signed)
POST OP   RT RCR   Chief Complaint  Patient presents with  . Follow-up    Recheck on right shoulder, DOS 10-15-18.    36 DAYS   I removed the end of a suture knot from the shoulder it was cleaned  She can start outpatient therapy at [facility] in 4 weeks  Hydrocodone refill 7.5 mg

## 2018-11-23 DIAGNOSIS — M5126 Other intervertebral disc displacement, lumbar region: Secondary | ICD-10-CM | POA: Diagnosis not present

## 2018-11-23 DIAGNOSIS — M25561 Pain in right knee: Secondary | ICD-10-CM | POA: Diagnosis not present

## 2018-11-23 DIAGNOSIS — M5116 Intervertebral disc disorders with radiculopathy, lumbar region: Secondary | ICD-10-CM | POA: Diagnosis not present

## 2018-11-30 ENCOUNTER — Telehealth: Payer: Self-pay | Admitting: Radiology

## 2018-11-30 DIAGNOSIS — Z9889 Other specified postprocedural states: Secondary | ICD-10-CM

## 2018-11-30 NOTE — Telephone Encounter (Signed)
Thanks. I have put in the order, looks like I missed it when she was here.

## 2018-11-30 NOTE — Telephone Encounter (Signed)
Patient called.  1- Kathryn Woods no one has called her to setup PT.  I do not see a PT order.  2- Carol/Betty/Angie- she wants to know if we have received the faxed referral for her to be seen for her knee.   Please call her to discuss- thanks.

## 2018-11-30 NOTE — Addendum Note (Signed)
Addended byCandice Camp on: 11/30/2018 02:31 PM   Modules accepted: Orders

## 2018-12-03 ENCOUNTER — Encounter (HOSPITAL_COMMUNITY): Payer: Self-pay

## 2018-12-03 ENCOUNTER — Other Ambulatory Visit: Payer: Self-pay

## 2018-12-03 ENCOUNTER — Ambulatory Visit (HOSPITAL_COMMUNITY): Payer: Medicare Other | Attending: Orthopedic Surgery

## 2018-12-03 DIAGNOSIS — R29898 Other symptoms and signs involving the musculoskeletal system: Secondary | ICD-10-CM | POA: Diagnosis not present

## 2018-12-03 DIAGNOSIS — G8929 Other chronic pain: Secondary | ICD-10-CM

## 2018-12-03 DIAGNOSIS — M25511 Pain in right shoulder: Secondary | ICD-10-CM | POA: Diagnosis not present

## 2018-12-03 DIAGNOSIS — M25611 Stiffness of right shoulder, not elsewhere classified: Secondary | ICD-10-CM | POA: Insufficient documentation

## 2018-12-03 NOTE — Therapy (Signed)
[facility] [address] [place], [place], [address] Phone: [phone]   Fax:  [phone]  Occupational Therapy Evaluation  Patient Details  Name: Kathryn Woods MRN: [id number] Date of Birth: 08/27/1950 Referring Provider (OT): Ty Hilts, MD   Encounter Date: 12/03/2018  OT End of Session - 12/03/18 1700    Visit Number  1    Number of Visits  16    Date for OT Re-Evaluation  01/28/19   mini reassessment:12/31/18   Authorization Type  [employer] Medicare; $35 copay (copay waived until 01/06/19)    Authorization Time Period  no visit limit, based on medical necessity    OT Start Time  1631    OT Stop Time  1715    OT Time Calculation (min)  44 min    Activity Tolerance  Patient tolerated treatment well    Behavior During Therapy  [facility] for tasks assessed/performed       Past Medical History:  Diagnosis Date  . Arthritis    lumbar region, spondylolisthesis     Past Surgical History:  Procedure Laterality Date  . BACK SURGERY    . SHOULDER ARTHROSCOPY WITH OPEN ROTATOR CUFF REPAIR Right 10/15/2018   Procedure: SHOULDER ARTHROSCOPY WITH OPEN ROTATOR CUFF REPAIR;  Surgeon: Vickki Hearing, MD;  Location: [facility];  Service: Orthopedics;  Laterality: Right;  . TUBAL LIGATION      There were no vitals filed for this visit.  Subjective Assessment - 12/03/18 1637    Subjective   S: I don't think i'll be able to go back to playing softball but I'd like to be able to do my yardwork.    Pertinent History  Patien tis a 68 y/o female S/P right shoulder arthrscopy with debridement and RTR which was completed on 10/15/18. Dr. Romeo Apple has referred patient to occupational therapy for evaluation and treatment.    Special Tests  Completed FOTO next session    Patient Stated Goals  To be able to use her right arm as normal as possible.    Currently in Pain?  Yes    Pain Score  5     Pain Location  Shoulder    Pain Orientation  Right     Pain Descriptors / Indicators  Aching;Constant    Pain Type  Acute pain    Pain Radiating Towards  down arm    Pain Onset  More than a month ago    Pain Frequency  Constant    Aggravating Factors   certain movement    Pain Relieving Factors  pain medication, rest, ice    Effect of Pain on Daily Activities  severe effect    Multiple Pain Sites  No        [facility] OT Assessment - 12/03/18 1639      Assessment   Medical Diagnosis  right shoulder athroscopy debridement and repair    Referring Provider (OT)  Ty Hilts, MD    Onset Date/Surgical Date  10/15/18    Hand Dominance  Right    Next MD Visit  12/18/2018    Prior Therapy  Patient received an eval and 1 treatment prior to surgery at this clinic with OT      Precautions   Precautions  Shoulder    Type of Shoulder Precautions  Per patient report: She was removed from sling at last MD visit. She is to refrain from lifting and reaching above shoulder level.  Restrictions   Weight Bearing Restrictions  Yes    RUE Weight Bearing  Non weight bearing      Balance Screen   Has the patient fallen in the past 6 months  Yes    How many times?  2    Has the patient had a decrease in activity level because of a fear of falling?   No    Is the patient reluctant to leave their home because of a fear of falling?   No      Home  Environment   Family/patient expects to be discharged to:  Private residence      Prior Function   Level of Ida  Retired    Leisure  yardwork      ADL   ADL comments  Pt reports that she is unable to do any yardwork and housekeeping tasks. Difficulty with donning/doffing, reaching above shoulder level.       Mobility   Mobility Status  Independent      Written Expression   Dominant Hand  Right      Vision - History   Baseline Vision  No visual deficits      Cognition   Overall Cognitive Status  Within Functional Limits for tasks assessed      ROM /  Strength   AROM / PROM / Strength  AROM;PROM;Strength      Palpation   Palpation comment  moderate fascial restrictions in right upper arm, deltoid, trapezius, and scapularis regions      AROM   Overall AROM Comments  Assessed seated, er/IR adducted    AROM Assessment Site  Shoulder    Right/Left Shoulder  Right    Right Shoulder Flexion  115 Degrees    Right Shoulder ABduction  100 Degrees    Right Shoulder Internal Rotation  90 Degrees    Right Shoulder External Rotation  30 Degrees      PROM   Overall PROM Comments  Assessed supine, er/IR adducted    PROM Assessment Site  Shoulder    Right/Left Shoulder  Right    Right Shoulder Flexion  130 Degrees    Right Shoulder ABduction  151 Degrees    Right Shoulder Internal Rotation  90 Degrees    Right Shoulder External Rotation  46 Degrees      Strength   Overall Strength Comments  Assessed seated, er/IR adducted    Strength Assessment Site  Shoulder    Right/Left Shoulder  Right    Right Shoulder Flexion  3-/5    Right Shoulder ABduction  3-/5    Right Shoulder Internal Rotation  3/5    Right Shoulder External Rotation  3-/5                      OT Education - 12/03/18 1700    Education Details  AA/ROM shoulder exercises. - laying down    Person(s) Educated  Patient    Methods  Explanation;Demonstration;Verbal cues;Handout    Comprehension  Returned demonstration;Verbalized understanding       OT Short Term Goals - 12/03/18 1723      OT SHORT TERM GOAL #1   Title  Pt will be provided with and educated on HEP to improve ability to use RUE as dominant during ADLs.     Time  3    Period  Weeks    Status  New    Target Date  12/24/18  OT Long Term Goals - 12/03/18 1723      OT LONG TERM GOAL #1   Title  Patient will return to highest level of independence while using her RUE for all daily and household tasks.    Time  8    Period  Weeks    Status  New    Target Date  01/28/19      OT LONG  TERM GOAL #2   Title  Patient will increase RUE A/ROM to WNL in order to reach into overhead cabinets with less difficulty.    Time  8    Period  Weeks    Status  New      OT LONG TERM GOAL #3   Title  Patient will decrease fascial restrictions to min amount or less in order to increase functional mobilty needed to complete reaching and dressing tasks.    Time  8    Period  Weeks    Status  New      OT LONG TERM GOAL #4   Title  Patient will increase her RUE strength to 4+/5 in order to return to completing her required yardwork tasks.    Time  8    Period  Weeks    Status  New      OT LONG TERM GOAL #5   Title  Patient will report a decrease in pain of approximately 3/10 or less when completing daily and household chores using her RUE.    Time  8    Period  Weeks    Status  New            Plan - 12/03/18 1720    Clinical Impression Statement  A: Patient is a 68 y/o female S/P right shoulder arthroscopy with debridgement and open repair causing increased pain, fascial restrictions, and decreased ROM and strength resulting in difficulty completing daily tasks using her RUE as her dominant arm.    OT Occupational Profile and History  Problem Focused Assessment - Including review of records relating to presenting problem    Occupational performance deficits (Please refer to evaluation for details):  ADL's;IADL's;Leisure    Body Structure / Function / Physical Skills  ADL;Strength;Pain;UE functional use;IADL;ROM;Fascial restriction    Rehab Potential  Excellent    Clinical Decision Making  Limited treatment options, no task modification necessary    Comorbidities Affecting Occupational Performance:  None    Modification or Assistance to Complete Evaluation   No modification of tasks or assist necessary to complete eval    OT Frequency  2x / week    OT Duration  8 weeks    OT Treatment/Interventions  Self-care/ADL training;Moist Heat;Therapeutic activities;Ultrasound;Therapeutic  exercise;Cryotherapy;Passive range of motion;Electrical Stimulation;Manual Therapy;Patient/family education;DME and/or AE instruction;Neuromuscular education    Plan  P: Pt will benefit from skilled OT services to increase functional performance during daily tasks using her RUE. Treatment plan: Complete FOTO next session. Myofascial release, manual stretching, start with AA/ROM and progress as able. general shoulder and scapular strengthening. Modalities PRN.    Consulted and Agree with Plan of Care  Patient       Patient will benefit from skilled therapeutic intervention in order to improve the following deficits and impairments:   Body Structure / Function / Physical Skills: ADL, Strength, Pain, UE functional use, IADL, ROM, Fascial restriction       Visit Diagnosis: Stiffness of right shoulder, not elsewhere classified  Other symptoms and signs involving the musculoskeletal system  Chronic right  shoulder pain    Problem List Patient Active Problem List   Diagnosis Date Noted  . Nontraumatic complete tear of right rotator cuff   . Biceps tendon rupture, proximal, right, subsequent encounter   . Primary osteoarthritis of right shoulder   . Labral tear of shoulder, degenerative, right   . Synovitis of right shoulder   . Spondylolisthesis at L3-L4 level 12/10/2016  . Bursitis, shoulder 02/24/2013  . HYPERLIPIDEMIA 02/04/2007  . WRIST SPRAIN, LEFT 02/04/2007  . MIGRAINE HEADACHE 01/15/2007  . BRONCHITIS, CHRONIC NOS 01/15/2007  . CONSTIPATION 01/15/2007  . IBS 01/15/2007  . DISC DISEASE, LUMBOSACRAL SPINE 01/15/2007  . LOW BACK PAIN, CHRONIC 01/15/2007  . MALAISE AND FATIGUE 01/15/2007   Limmie PatriciaLaura Shomari Scicchitano, OTR/L,CBIS  [phone]  12/03/2018, 5:28 PM  [facility] [address] [place], [place], [address] Phone: [phone]   Fax:  [phone]  Name: Glorious Peachatricia Enoch Clavijo MRN: [id number] Date of Birth: 09/20/1950

## 2018-12-03 NOTE — Patient Instructions (Signed)
Perform each exercise ____10-12____ reps. 2-3x days.  DO LAYING DOWN ON THE FLOOR OR ON THE BED FOR NOW.   Protraction -   Start by holding a wand or cane at chest height.  Next, slowly push the wand outwards in front of your body so that your elbows become fully straightened. Then, return to the original position.     Shoulder FLEXION - STANDING - PALMS DOWN  In the standing position, hold a wand/cane with both arms, palms down on both sides. Raise up the wand/cane allowing your unaffected arm to perform most of the effort. Your affected arm should be partially relaxed.      Internal/External ROTATION - STANDING  In the standing position, hold a wand/cane with both hands keeping your elbows bent. Move your arms and wand/cane to one side.  Your affected arm should be partially relaxed while your unaffected arm performs most of the effort.       Shoulder ABDUCTION - STANDING  While holding a wand/cane palm face up on the injured side and palm face down on the uninjured side, slowly raise up your injured arm to the side.        Horizontal Abduction/Adduction      Straight arms holding cane at shoulder height, bring cane to right, center, left. Repeat starting to left.   Copyright  VHI. All rights reserved.

## 2018-12-07 ENCOUNTER — Encounter (HOSPITAL_COMMUNITY): Payer: Self-pay

## 2018-12-07 ENCOUNTER — Other Ambulatory Visit: Payer: Self-pay

## 2018-12-07 ENCOUNTER — Ambulatory Visit (HOSPITAL_COMMUNITY): Payer: Medicare Other

## 2018-12-07 DIAGNOSIS — G8929 Other chronic pain: Secondary | ICD-10-CM | POA: Diagnosis not present

## 2018-12-07 DIAGNOSIS — R29898 Other symptoms and signs involving the musculoskeletal system: Secondary | ICD-10-CM

## 2018-12-07 DIAGNOSIS — M25611 Stiffness of right shoulder, not elsewhere classified: Secondary | ICD-10-CM

## 2018-12-07 DIAGNOSIS — M25511 Pain in right shoulder: Secondary | ICD-10-CM | POA: Diagnosis not present

## 2018-12-07 NOTE — Therapy (Signed)
[facility] [address] [place], [place], [address] Phone: [phone]   Fax:  [phone]  Occupational Therapy Treatment  Patient Details  Name: Kathryn Woods MRN: [id number] Date of Birth: 1950-11-07 Referring Provider (OT): Adonis Huguenin, MD   Encounter Date: 12/07/2018  OT End of Session - 12/07/18 1145    Visit Number  2    Number of Visits  16    Date for OT Re-Evaluation  01/28/19   mini reassessment:12/31/18   Authorization Type  [employer] Medicare; $35 copay (copay waived until 01/06/19)    Authorization Time Period  no visit limit, based on medical necessity    OT Start Time  1125    OT Stop Time  1203    OT Time Calculation (min)  38 min    Activity Tolerance  Patient tolerated treatment well    Behavior During Therapy  [facility] for tasks assessed/performed       Past Medical History:  Diagnosis Date  . Arthritis    lumbar region, spondylolisthesis     Past Surgical History:  Procedure Laterality Date  . BACK SURGERY    . SHOULDER ARTHROSCOPY WITH OPEN ROTATOR CUFF REPAIR Right 10/15/2018   Procedure: SHOULDER ARTHROSCOPY WITH OPEN ROTATOR CUFF REPAIR;  Surgeon: Carole Civil, MD;  Location: [facility];  Service: Orthopedics;  Laterality: Right;  . TUBAL LIGATION      There were no vitals filed for this visit.  Subjective Assessment - 12/07/18 1143    Subjective   S: I think I did too much with it. I was mopping floors after I had people over for a cook out.    Currently in Pain?  Yes    Pain Score  8     Pain Location  Shoulder    Pain Orientation  Right    Pain Descriptors / Indicators  Aching;Constant;Sore    Pain Type  Acute pain         [facility] OT Assessment - 12/07/18 1146      Assessment   Medical Diagnosis  right shoulder athroscopy debridement and repair      Precautions   Precautions  Shoulder    Type of Shoulder Precautions  Per patient report: She was removed from sling at last MD visit.  She is to refrain from lifting and reaching above shoulder level.       Observation/Other Assessments   Focus on Therapeutic Outcomes (FOTO)   55/100               OT Treatments/Exercises (OP) - 12/07/18 1146      Exercises   Exercises  Shoulder      Shoulder Exercises: Supine   Protraction  PROM;5 reps;AROM;10 reps    Horizontal ABduction  PROM;5 reps;AAROM;10 reps    External Rotation  PROM;5 reps;AROM;10 reps    Internal Rotation  PROM;5 reps;AROM;10 reps    Flexion  PROM;5 reps;AAROM;10 reps    ABduction  PROM;5 reps;AAROM;10 reps      Shoulder Exercises: Pulleys   Flexion  1 minute    ABduction  1 minute      Shoulder Exercises: ROM/Strengthening   Wall Wash  1'      Manual Therapy   Manual Therapy  Myofascial release    Manual therapy comments  Manual therapy completed prior to exercises    Myofascial Release  myofascial release and manual stretching completed to right upper arm, trapezius, and scapularis region to decrease fascial restrictions  and increase joint mobility in a pain free zone.              OT Education - 12/07/18 1144    Education Details  reviewed therapy goals.    Person(s) Educated  Patient    Methods  Explanation    Comprehension  Verbalized understanding       OT Short Term Goals - 12/07/18 1144      OT SHORT TERM GOAL #1   Title  Pt will be provided with and educated on HEP to improve ability to use RUE as dominant during ADLs.     Time  3    Period  Weeks    Status  On-going    Target Date  12/24/18        OT Long Term Goals - 12/07/18 1144      OT LONG TERM GOAL #1   Title  Patient will return to highest level of independence while using her RUE for all daily and household tasks.    Time  8    Period  Weeks    Status  On-going      OT LONG TERM GOAL #2   Title  Patient will increase RUE A/ROM to WNL in order to reach into overhead cabinets with less difficulty.    Time  8    Period  Weeks    Status  On-going       OT LONG TERM GOAL #3   Title  Patient will decrease fascial restrictions to min amount or less in order to increase functional mobilty needed to complete reaching and dressing tasks.    Time  8    Period  Weeks    Status  On-going      OT LONG TERM GOAL #4   Title  Patient will increase her RUE strength to 4+/5 in order to return to completing her required yardwork tasks.    Time  8    Period  Weeks    Status  On-going      OT LONG TERM GOAL #5   Title  Patient will report a decrease in pain of approximately 3/10 or less when completing daily and household chores using her RUE.    Time  8    Period  Weeks    Status  On-going            Plan - 12/07/18 1218    Clinical Impression Statement  A: Initiated myofascial release, manual stretching, supine A/ROM and AA/ROM and standing AA/ROM. patient completed all exercises and tolerated well even with a high level of pain reported. Encouraged patient to complete HEP standing now versus only laying down as directed at evaluation. VC were provided during session for form and technique.    Body Structure / Function / Physical Skills  ADL;Strength;Pain;UE functional use;IADL;ROM;Fascial restriction    Plan  P: Continue with AA/ROM. Add PVC pipe slide.    Consulted and Agree with Plan of Care  Patient       Patient will benefit from skilled therapeutic intervention in order to improve the following deficits and impairments:   Body Structure / Function / Physical Skills: ADL, Strength, Pain, UE functional use, IADL, ROM, Fascial restriction       Visit Diagnosis: Chronic right shoulder pain  Other symptoms and signs involving the musculoskeletal system  Stiffness of right shoulder, not elsewhere classified    Problem List Patient Active Problem List   Diagnosis Date Noted  .  Nontraumatic complete tear of right rotator cuff   . Biceps tendon rupture, proximal, right, subsequent encounter   . Primary osteoarthritis of  right shoulder   . Labral tear of shoulder, degenerative, right   . Synovitis of right shoulder   . Spondylolisthesis at L3-L4 level 12/10/2016  . Bursitis, shoulder 02/24/2013  . HYPERLIPIDEMIA 02/04/2007  . WRIST SPRAIN, LEFT 02/04/2007  . MIGRAINE HEADACHE 01/15/2007  . BRONCHITIS, CHRONIC NOS 01/15/2007  . CONSTIPATION 01/15/2007  . IBS 01/15/2007  . DISC DISEASE, LUMBOSACRAL SPINE 01/15/2007  . LOW BACK PAIN, CHRONIC 01/15/2007  . MALAISE AND FATIGUE 01/15/2007   Limmie PatriciaLaura Mathew Postiglione, OTR/L,CBIS  [phone]  12/07/2018, 12:20 PM  [facility] [address] [place] [place], [place], [address] Phone: [phone]   Fax:  [phone]  Name: Kathryn Woods MRN: [id number] Date of Birth: 08/13/1950

## 2018-12-09 ENCOUNTER — Encounter (HOSPITAL_COMMUNITY): Payer: Self-pay

## 2018-12-09 ENCOUNTER — Other Ambulatory Visit: Payer: Self-pay

## 2018-12-09 ENCOUNTER — Ambulatory Visit (HOSPITAL_COMMUNITY): Payer: Medicare Other | Attending: Orthopedic Surgery

## 2018-12-09 DIAGNOSIS — R29898 Other symptoms and signs involving the musculoskeletal system: Secondary | ICD-10-CM

## 2018-12-09 DIAGNOSIS — G8929 Other chronic pain: Secondary | ICD-10-CM

## 2018-12-09 DIAGNOSIS — M25611 Stiffness of right shoulder, not elsewhere classified: Secondary | ICD-10-CM | POA: Insufficient documentation

## 2018-12-09 DIAGNOSIS — M25511 Pain in right shoulder: Secondary | ICD-10-CM | POA: Diagnosis not present

## 2018-12-09 NOTE — Telephone Encounter (Signed)
Patient aware of appointments  

## 2018-12-09 NOTE — Therapy (Signed)
[facility] [address] [place], [place], [address] Phone: [phone]   Fax:  [phone]  Occupational Therapy Treatment  Patient Details  Name: Kathryn Woods MRN: [id number] Date of Birth: 11/03/1950 Referring Provider (OT): Ty Hilts, MD   Encounter Date: 12/09/2018  OT End of Session - 12/09/18 1722    Visit Number  3    Number of Visits  16    Date for OT Re-Evaluation  01/28/19   mini reassessment:12/31/18   Authorization Type  [employer] Medicare; $35 copay (copay waived until 01/06/19)    Authorization Time Period  no visit limit, based on medical necessity    OT Start Time  1649    OT Stop Time  1724    OT Time Calculation (min)  35 min    Activity Tolerance  Patient tolerated treatment well    Behavior During Therapy  [facility] for tasks assessed/performed       Past Medical History:  Diagnosis Date  . Arthritis    lumbar region, spondylolisthesis     Past Surgical History:  Procedure Laterality Date  . BACK SURGERY    . SHOULDER ARTHROSCOPY WITH OPEN ROTATOR CUFF REPAIR Right 10/15/2018   Procedure: SHOULDER ARTHROSCOPY WITH OPEN ROTATOR CUFF REPAIR;  Surgeon: Vickki Hearing, MD;  Location: [facility];  Service: Orthopedics;  Laterality: Right;  . TUBAL LIGATION      There were no vitals filed for this visit.  Subjective Assessment - 12/09/18 1706    Subjective   S: I'm still sore. I am just doing a lot with it.    Currently in Pain?  Yes    Pain Score  7     Pain Location  Shoulder    Pain Orientation  Right    Pain Descriptors / Indicators  Aching;Constant    Pain Type  Acute pain         [facility] OT Assessment - 12/09/18 1707      Assessment   Medical Diagnosis  right shoulder athroscopy debridement and repair      Precautions   Precautions  Shoulder    Type of Shoulder Precautions  Per patient report: She was removed from sling at last MD visit. She is to refrain from lifting and reaching above  shoulder level.                OT Treatments/Exercises (OP) - 12/09/18 1707      Exercises   Exercises  Shoulder      Shoulder Exercises: Supine   Protraction  PROM;5 reps;AROM;10 reps    Horizontal ABduction  PROM;5 reps;AAROM;10 reps    External Rotation  PROM;5 reps;AROM;10 reps    Internal Rotation  PROM;5 reps;AROM;10 reps    Flexion  PROM;5 reps;AAROM;10 reps    ABduction  PROM;5 reps;AAROM;10 reps      Shoulder Exercises: Standing   Protraction  AAROM;10 reps    Flexion  AAROM;10 reps      Shoulder Exercises: Pulleys   Flexion  1 minute    ABduction  1 minute      Shoulder Exercises: ROM/Strengthening   Wall Wash  1'    Other ROM/Strengthening Exercises  PVC pipe slide; 10X      Manual Therapy   Manual Therapy  Myofascial release    Manual therapy comments  Manual therapy completed prior to exercises    Myofascial Release  myofascial release and manual stretching completed to right upper arm, trapezius, and scapularis  region to decrease fascial restrictions and increase joint mobility in a pain free zone.                OT Short Term Goals - 12/07/18 1144      OT SHORT TERM GOAL #1   Title  Pt will be provided with and educated on HEP to improve ability to use RUE as dominant during ADLs.     Time  3    Period  Weeks    Status  On-going    Target Date  12/24/18        OT Long Term Goals - 12/07/18 1144      OT LONG TERM GOAL #1   Title  Patient will return to highest level of independence while using her RUE for all daily and household tasks.    Time  8    Period  Weeks    Status  On-going      OT LONG TERM GOAL #2   Title  Patient will increase RUE A/ROM to WNL in order to reach into overhead cabinets with less difficulty.    Time  8    Period  Weeks    Status  On-going      OT LONG TERM GOAL #3   Title  Patient will decrease fascial restrictions to min amount or less in order to increase functional mobilty needed to complete  reaching and dressing tasks.    Time  8    Period  Weeks    Status  On-going      OT LONG TERM GOAL #4   Title  Patient will increase her RUE strength to 4+/5 in order to return to completing her required yardwork tasks.    Time  8    Period  Weeks    Status  On-going      OT LONG TERM GOAL #5   Title  Patient will report a decrease in pain of approximately 3/10 or less when completing daily and household chores using her RUE.    Time  8    Period  Weeks    Status  On-going            Plan - 12/09/18 1723    Clinical Impression Statement  A: Added PVC pipe slide and standing AA/ROM shoulder exercises. Pt completed with mild fatigue. VC for form and technique were provided as needed. Less fascial restrictions noted during manual techniques in right shoulder region.    Body Structure / Function / Physical Skills  ADL;Strength;Pain;UE functional use;IADL;ROM;Fascial restriction    Plan  P: Add proximal shoulder strengthening supine and standing.    Consulted and Agree with Plan of Care  Patient       Patient will benefit from skilled therapeutic intervention in order to improve the following deficits and impairments:   Body Structure / Function / Physical Skills: ADL, Strength, Pain, UE functional use, IADL, ROM, Fascial restriction       Visit Diagnosis: Other symptoms and signs involving the musculoskeletal system  Chronic right shoulder pain  Stiffness of right shoulder, not elsewhere classified    Problem List Patient Active Problem List   Diagnosis Date Noted  . Nontraumatic complete tear of right rotator cuff   . Biceps tendon rupture, proximal, right, subsequent encounter   . Primary osteoarthritis of right shoulder   . Labral tear of shoulder, degenerative, right   . Synovitis of right shoulder   . Spondylolisthesis at L3-L4 level 12/10/2016  .  Bursitis, shoulder 02/24/2013  . HYPERLIPIDEMIA 02/04/2007  . WRIST SPRAIN, LEFT 02/04/2007  . MIGRAINE  HEADACHE 01/15/2007  . BRONCHITIS, CHRONIC NOS 01/15/2007  . CONSTIPATION 01/15/2007  . IBS 01/15/2007  . [place] DISEASE, LUMBOSACRAL SPINE 01/15/2007  . LOW BACK PAIN, CHRONIC 01/15/2007  . MALAISE AND FATIGUE 01/15/2007   Ailene Ravel, OTR/L,CBIS  [phone]  12/09/2018, 5:32 PM  [facility] [address] [place], [place], [address] Phone: [phone]   Fax:  [phone]  Name: Kaziah Krizek MRN: [id number] Date of Birth: 1951/03/14

## 2018-12-16 ENCOUNTER — Encounter (HOSPITAL_COMMUNITY): Payer: Self-pay

## 2018-12-16 ENCOUNTER — Other Ambulatory Visit: Payer: Self-pay

## 2018-12-16 ENCOUNTER — Ambulatory Visit (HOSPITAL_COMMUNITY): Payer: Medicare Other

## 2018-12-16 DIAGNOSIS — G8929 Other chronic pain: Secondary | ICD-10-CM

## 2018-12-16 DIAGNOSIS — M25611 Stiffness of right shoulder, not elsewhere classified: Secondary | ICD-10-CM

## 2018-12-16 DIAGNOSIS — R29898 Other symptoms and signs involving the musculoskeletal system: Secondary | ICD-10-CM

## 2018-12-16 DIAGNOSIS — M25511 Pain in right shoulder: Secondary | ICD-10-CM | POA: Diagnosis not present

## 2018-12-16 NOTE — Therapy (Signed)
[place] [facility] [address] [place], [place], [address] Phone: [phone]   Fax:  [phone]  Occupational Therapy Treatment  Patient Details  Name: Kathryn Woods MRN: [id number] Date of Birth: [birth year] Referring Provider (OT): Ty Hilts, MD   Encounter Date: 12/16/2018  OT End of Session - 12/16/18 1058    Visit Number  4    Number of Visits  16    Date for OT Re-Evaluation  01/28/19   mini reassessment:12/31/18   Authorization Type  [employer] Medicare; $35 copay (copay waived until 01/06/19)    Authorization Time Period  no visit limit, based on medical necessity    OT Start Time  1031    OT Stop Time  1110    OT Time Calculation (min)  39 min    Activity Tolerance  Patient tolerated treatment well    Behavior During Therapy  [facility] for tasks assessed/performed       Past Medical History:  Diagnosis Date  . Arthritis    lumbar region, spondylolisthesis     Past Surgical History:  Procedure Laterality Date  . BACK SURGERY    . SHOULDER ARTHROSCOPY WITH OPEN ROTATOR CUFF REPAIR Right 10/15/2018   Procedure: SHOULDER ARTHROSCOPY WITH OPEN ROTATOR CUFF REPAIR;  Surgeon: Vickki Hearing, MD;  Location: [facility];  Service: Orthopedics;  Laterality: Right;  . TUBAL LIGATION      There were no vitals filed for this visit.  Subjective Assessment - 12/16/18 1047    Subjective   S: It's getting better. It doesn't hurt as bad.    Currently in Pain?  No/denies         [facility] OT Assessment - 12/16/18 1210      Assessment   Medical Diagnosis  right shoulder athroscopy debridement and repair      Precautions   Precautions  Shoulder    Type of Shoulder Precautions  Per patient report: She was removed from sling at last MD visit. She is to refrain from lifting and reaching above shoulder level.                OT Treatments/Exercises (OP) - 12/16/18 1049      Exercises   Exercises  Shoulder      Shoulder  Exercises: Supine   Protraction  PROM;5 reps;AROM;12 reps    Horizontal ABduction  PROM;5 reps;AROM;12 reps    External Rotation  PROM;5 reps;AROM;12 reps    Internal Rotation  PROM;5 reps;AROM;12 reps    Flexion  PROM;5 reps;AROM;12 reps    ABduction  PROM;5 reps;AROM;12 reps      Shoulder Exercises: Standing   Protraction  AROM;10 reps    Horizontal ABduction  AROM;10 reps    External Rotation  AROM;10 reps    Internal Rotation  AROM;10 reps    Flexion  AROM;10 reps    ABduction  AROM;10 reps    Extension  Theraband;10 reps    Theraband Level (Shoulder Extension)  Level 2 (Red)    Row  Theraband;10 reps    Theraband Level (Shoulder Row)  Level 2 (Red)    Retraction  Theraband;10 reps    Theraband Level (Shoulder Retraction)  Level 2 (Red)      Shoulder Exercises: ROM/Strengthening   UBE (Upper Arm Bike)  Level 2' forward 2' reverse   pace: 5.0-6.0   Wall Wash  1'    Rhythmic Stabilization, Supine  10X A/ROM    Rhythmic Stabilization, Seated  10X A/ROM  Manual Therapy   Manual Therapy  Myofascial release    Manual therapy comments  Manual therapy completed prior to exercises    Myofascial Release  myofascial release and manual stretching completed to right upper arm, trapezius, and scapularis region to decrease fascial restrictions and increase joint mobility in a pain free zone.              OT Education - 12/16/18 1058    Education Details  A/ROM shoulder exercises. Patient made stop AA/ROM exercises.    Person(s) Educated  Patient    Methods  Explanation;Demonstration;Handout;Verbal cues    Comprehension  Returned demonstration;Verbalized understanding       OT Short Term Goals - 12/07/18 1144      OT SHORT TERM GOAL #1   Title  Pt will be provided with and educated on HEP to improve ability to use RUE as dominant during ADLs.     Time  3    Period  Weeks    Status  On-going    Target Date  12/24/18        OT Long Term Goals - 12/07/18 1144       OT LONG TERM GOAL #1   Title  Patient will return to highest level of independence while using her RUE for all daily and household tasks.    Time  8    Period  Weeks    Status  On-going      OT LONG TERM GOAL #2   Title  Patient will increase RUE A/ROM to WNL in order to reach into overhead cabinets with less difficulty.    Time  8    Period  Weeks    Status  On-going      OT LONG TERM GOAL #3   Title  Patient will decrease fascial restrictions to min amount or less in order to increase functional mobilty needed to complete reaching and dressing tasks.    Time  8    Period  Weeks    Status  On-going      OT LONG TERM GOAL #4   Title  Patient will increase her RUE strength to 4+/5 in order to return to completing her required yardwork tasks.    Time  8    Period  Weeks    Status  On-going      OT LONG TERM GOAL #5   Title  Patient will report a decrease in pain of approximately 3/10 or less when completing daily and household chores using her RUE.    Time  8    Period  Weeks    Status  On-going            Plan - 12/16/18 1208    Clinical Impression Statement  A: Pt was able to complete all supine and standing A/ROM shoulder exercises this date. Added scapular strengthening with red band and UBE bike during session to increase shoulder endurance and strength. Pt demonstrates near full ROM during session. VC for form and technique were provided. Completed manual techniques to address fascial restrictions in right upper trapezius. Updated HEP this session as well.    Body Structure / Function / Physical Skills  ADL;Strength;Pain;UE functional use;IADL;ROM;Fascial restriction    Plan  P: Take measurements for MD appointment on Monday as patient is demonstrating improvement from evaluation. Complete functional reaching task with cones in cabinet.    Consulted and Agree with Plan of Care  Patient       Patient  will benefit from skilled therapeutic intervention in order to  improve the following deficits and impairments:   Body Structure / Function / Physical Skills: ADL, Strength, Pain, UE functional use, IADL, ROM, Fascial restriction       Visit Diagnosis: Other symptoms and signs involving the musculoskeletal system  Chronic right shoulder pain  Stiffness of right shoulder, not elsewhere classified    Problem List Patient Active Problem List   Diagnosis Date Noted  . Nontraumatic complete tear of right rotator cuff   . Biceps tendon rupture, proximal, right, subsequent encounter   . Primary osteoarthritis of right shoulder   . Labral tear of shoulder, degenerative, right   . Synovitis of right shoulder   . Spondylolisthesis at L3-L4 level 12/10/2016  . Bursitis, shoulder 02/24/2013  . HYPERLIPIDEMIA 02/04/2007  . WRIST SPRAIN, LEFT 02/04/2007  . MIGRAINE HEADACHE 01/15/2007  . BRONCHITIS, CHRONIC NOS 01/15/2007  . CONSTIPATION 01/15/2007  . IBS 01/15/2007  . [place] DISEASE, LUMBOSACRAL SPINE 01/15/2007  . LOW BACK PAIN, CHRONIC 01/15/2007  . MALAISE AND FATIGUE 01/15/2007   Ailene Ravel, OTR/L,CBIS  [phone]  12/16/2018, 12:11 PM  [place] [address] [place], [place], [address] Phone: [phone]   Fax:  [phone]  Name: Kathryn Woods MRN: [id number] Date of Birth: 16-Aug-1950

## 2018-12-16 NOTE — Patient Instructions (Signed)
Repeat all exercises 10-15 times, 1-2 times per day.  1) Shoulder Protraction    Begin with elbows by your side, slowly "punch" straight out in front of you.      2) Shoulder Flexion  Standing:         Begin with arms at your side with thumbs pointed up, slowly raise both arms up and forward towards overhead.       3) Horizontal abduction/adduction    Standing:           Begin with arms straight out in front of you, bring out to the side in at "T" shape. Keep arms straight entire time.      4) Internal & External Rotation    *No band* -Stand with elbows at the side and elbows bent 90 degrees. Move your forearms away from your body, then bring back inward toward the body.     5) Shoulder Abduction  Standing:       Begin with your arms flat on the table next to your side. Slowly move your arms out to the side so that they go overhead, in a jumping jack or snow angel movement.         

## 2018-12-17 ENCOUNTER — Telehealth (HOSPITAL_COMMUNITY): Payer: Self-pay | Admitting: Internal Medicine

## 2018-12-17 ENCOUNTER — Ambulatory Visit (HOSPITAL_COMMUNITY): Payer: Medicare Other | Admitting: Occupational Therapy

## 2018-12-17 NOTE — Telephone Encounter (Signed)
12/17/18  Magda Paganini called to see if patient wanted to come in earlier today but the patient told her she needed to cancel because they were going out of town today.  I told her we would add to the end of her schedule since she had appts already scheduled.

## 2018-12-18 ENCOUNTER — Ambulatory Visit: Payer: Medicare Other | Admitting: Orthopedic Surgery

## 2018-12-21 ENCOUNTER — Other Ambulatory Visit: Payer: Self-pay

## 2018-12-21 ENCOUNTER — Encounter: Payer: Self-pay | Admitting: Orthopedic Surgery

## 2018-12-21 ENCOUNTER — Encounter (HOSPITAL_COMMUNITY): Payer: Medicare Other

## 2018-12-21 ENCOUNTER — Ambulatory Visit (INDEPENDENT_AMBULATORY_CARE_PROVIDER_SITE_OTHER): Payer: Medicare Other | Admitting: Orthopedic Surgery

## 2018-12-21 DIAGNOSIS — Z9889 Other specified postprocedural states: Secondary | ICD-10-CM

## 2018-12-21 MED ORDER — HYDROCODONE-ACETAMINOPHEN 5-325 MG PO TABS: 1.0000 | ORAL_TABLET | Freq: Four times a day (QID) | ORAL | 0 refills | Status: DC | PRN

## 2018-12-21 NOTE — Progress Notes (Signed)
Chief Complaint  Patient presents with  . Post-op Follow-up    10/15/18 right shoulder Rotator cuff repair    Kathryn Woods is doing very well at 2 months after cuff repair.  He has pain when she goes to therapy otherwise has returned to some activities including mowing the lawn and weed eating  Her wound looks good her shoulder swelling is gone down she has excellent passive range of motion of 280 degrees flexion 110 degrees abduction normal external rotation  She can transition to home program at the end of the month follow-up in 6 weeks  Meds ordered this encounter  Medications  . HYDROcodone-acetaminophen (NORCO/VICODIN) 5-325 MG tablet    Sig: Take 1 tablet by mouth every 6 (six) hours as needed for moderate pain.    Dispense:  30 tablet    Refill:  0    Encounter Diagnosis  Name Primary?  . Status post rotator cuff repair 10/15/2018 Yes

## 2018-12-21 NOTE — Progress Notes (Signed)
Chief Complaint  Patient presents with  . Post-op Follow-up    10/15/18 right shoulder Rotator cuff repair

## 2018-12-22 ENCOUNTER — Ambulatory Visit (HOSPITAL_COMMUNITY): Payer: Medicare Other | Admitting: Occupational Therapy

## 2018-12-22 ENCOUNTER — Telehealth (HOSPITAL_COMMUNITY): Payer: Self-pay | Admitting: Occupational Therapy

## 2018-12-22 NOTE — Telephone Encounter (Signed)
She will be out of town and can not get back today for this visit

## 2018-12-23 ENCOUNTER — Other Ambulatory Visit: Payer: Self-pay

## 2018-12-23 ENCOUNTER — Encounter (HOSPITAL_COMMUNITY): Payer: Self-pay | Admitting: Occupational Therapy

## 2018-12-23 ENCOUNTER — Ambulatory Visit (HOSPITAL_COMMUNITY): Payer: Medicare Other | Admitting: Occupational Therapy

## 2018-12-23 DIAGNOSIS — M25611 Stiffness of right shoulder, not elsewhere classified: Secondary | ICD-10-CM | POA: Diagnosis not present

## 2018-12-23 DIAGNOSIS — M25511 Pain in right shoulder: Secondary | ICD-10-CM | POA: Diagnosis not present

## 2018-12-23 DIAGNOSIS — R29898 Other symptoms and signs involving the musculoskeletal system: Secondary | ICD-10-CM

## 2018-12-23 DIAGNOSIS — G8929 Other chronic pain: Secondary | ICD-10-CM | POA: Diagnosis not present

## 2018-12-23 NOTE — Therapy (Signed)
[facility] [address] [place], [place], [address] Phone: [phone]   Fax:  [phone]  Occupational Therapy Treatment  Patient Details  Name: Kathryn Woods MRN: [id number] Date of Birth: 07/27/1950 Referring Provider (OT): Ty HiltsStanely Harrison, MD   Encounter Date: 12/23/2018  OT End of Session - 12/23/18 1640    Visit Number  5    Number of Visits  16    Date for OT Re-Evaluation  01/28/19   mini reassessment:12/31/18   Authorization Type  [employer] Medicare; $35 copay (copay waived until 01/06/19)    Authorization Time Period  no visit limit, based on medical necessity    OT Start Time  1603    OT Stop Time  1642    OT Time Calculation (min)  39 min    Activity Tolerance  Patient tolerated treatment well    Behavior During Therapy  [facility] for tasks assessed/performed       Past Medical History:  Diagnosis Date  . Arthritis    lumbar region, spondylolisthesis     Past Surgical History:  Procedure Laterality Date  . BACK SURGERY    . SHOULDER ARTHROSCOPY WITH OPEN ROTATOR CUFF REPAIR Right 10/15/2018   Procedure: SHOULDER ARTHROSCOPY WITH OPEN ROTATOR CUFF REPAIR;  Surgeon: Vickki HearingHarrison, Stanley E, MD;  Location: [facility];  Service: Orthopedics;  Laterality: Right;  . TUBAL LIGATION      There were no vitals filed for this visit.  Subjective Assessment - 12/23/18 1602    Subjective   S: The doctor said after this month we'll go to a home program.    Currently in Pain?  No/denies         [facility] - 12/23/18 1602      Assessment   Medical Diagnosis  right shoulder athroscopy debridement and repair      Precautions   Precautions  Shoulder    Type of Shoulder Precautions  progress as tolerated               OT Treatments/Exercises (OP) - 12/23/18 1605      Exercises   Exercises  Shoulder      Shoulder Exercises: Supine   Protraction  PROM;5 reps;Strengthening;12 reps    Protraction Weight (lbs)   1    Horizontal ABduction  PROM;5 reps;Strengthening;12 reps    Horizontal ABduction Weight (lbs)  1    External Rotation  PROM;5 reps;Strengthening;12 reps    External Rotation Weight (lbs)  1    Internal Rotation  PROM;5 reps;Strengthening;12 reps    Internal Rotation Weight (lbs)  1    Flexion  PROM;5 reps;Strengthening;12 reps    Shoulder Flexion Weight (lbs)  1    ABduction  PROM;5 reps;Strengthening;12 reps    Shoulder ABduction Weight (lbs)  1      Shoulder Exercises: Standing   Protraction  AROM;12 reps    Horizontal ABduction  AROM;12 reps    External Rotation  AROM;12 reps    Internal Rotation  AROM;12 reps    Flexion  AROM;12 reps    ABduction  AROM;12 reps    Extension  Theraband;10 reps    Theraband Level (Shoulder Extension)  Level 2 (Red)    Row  Theraband;10 reps    Theraband Level (Shoulder Row)  Level 2 (Red)    Retraction  Theraband;10 reps    Theraband Level (Shoulder Retraction)  Level 2 (Red)      Shoulder Exercises: ROM/Strengthening   UBE (Upper Arm  Bike)  Level 1 3' forward 3' reverse   pace: 5.0   Wall Wash  1'    Proximal Shoulder Strengthening, Supine  10X each, 1# weights no rest breaks    Proximal Shoulder Strengthening, Seated  10X each, no rest breaks    Rhythmic Stabilization, Supine  25X at 45,90, and 120 degrees, min difficulty    Other ROM/Strengthening Exercises  ball pass behind back working on IR, 10X using tennis ball    Other ROM/Strengthening Exercises  proximal shoulder strengthening on door with washcloth, flexion, 1'       Shoulder Exercises: Stretch   Internal Rotation Stretch  3 reps   10" horizontal towel     Functional Reaching Activities   High Level  Pt placed 10 cones on top shelf in cabinet in flexion, removed in abduction, min difficulty with task      Manual Therapy   Manual Therapy  Myofascial release    Manual therapy comments  Manual therapy completed prior to exercises    Myofascial Release  myofascial release  and manual stretching completed to right upper arm, trapezius, and scapularis region to decrease fascial restrictions and increase joint mobility in a pain free zone.                OT Short Term Goals - 12/07/18 1144      OT SHORT TERM GOAL #1   Title  Pt will be provided with and educated on HEP to improve ability to use RUE as dominant during ADLs.     Time  3    Period  Weeks    Status  On-going    Target Date  12/24/18        OT Long Term Goals - 12/07/18 1144      OT LONG TERM GOAL #1   Title  Patient will return to highest level of independence while using her RUE for all daily and household tasks.    Time  8    Period  Weeks    Status  On-going      OT LONG TERM GOAL #2   Title  Patient will increase RUE A/ROM to WNL in order to reach into overhead cabinets with less difficulty.    Time  8    Period  Weeks    Status  On-going      OT LONG TERM GOAL #3   Title  Patient will decrease fascial restrictions to min amount or less in order to increase functional mobilty needed to complete reaching and dressing tasks.    Time  8    Period  Weeks    Status  On-going      OT LONG TERM GOAL #4   Title  Patient will increase her RUE strength to 4+/5 in order to return to completing her required yardwork tasks.    Time  8    Period  Weeks    Status  On-going      OT LONG TERM GOAL #5   Title  Patient will report a decrease in pain of approximately 3/10 or less when completing daily and household chores using her RUE.    Time  8    Period  Weeks    Status  On-going            Plan - 12/23/18 1638    Clinical Impression Statement  A: Pt reports MD is pleased with her progress and has instructed her to complete this month in  therapy and then transition to HEP. Progressed to strengthening in supine today, continued with A/ROM in standing and added scapular stability tasks. Also added functional reaching task, pt completing with min difficulty. Pt with min  fatigue during session. Verbal cuing for form and technique.    Body Structure / Function / Physical Skills  ADL;Strength;Pain;UE functional use;IADL;ROM;Fascial restriction    Plan  P: Add x to v arms, update HEP for scapular theraband       Patient will benefit from skilled therapeutic intervention in order to improve the following deficits and impairments:   Body Structure / Function / Physical Skills: ADL, Strength, Pain, UE functional use, IADL, ROM, Fascial restriction       Visit Diagnosis: Other symptoms and signs involving the musculoskeletal system  Stiffness of right shoulder, not elsewhere classified    Problem List Patient Active Problem List   Diagnosis Date Noted  . Status post rotator cuff repair 10/15/2018 12/21/2018  . Biceps tendon rupture, proximal, right, subsequent encounter   . Primary osteoarthritis of right shoulder   . Labral tear of shoulder, degenerative, right   . Synovitis of right shoulder   . Spondylolisthesis at L3-L4 level 12/10/2016  . Bursitis, shoulder 02/24/2013  . HYPERLIPIDEMIA 02/04/2007  . WRIST SPRAIN, LEFT 02/04/2007  . MIGRAINE HEADACHE 01/15/2007  . BRONCHITIS, CHRONIC NOS 01/15/2007  . CONSTIPATION 01/15/2007  . IBS 01/15/2007  . [place]C DISEASE, LUMBOSACRAL SPINE 01/15/2007  . LOW BACK PAIN, CHRONIC 01/15/2007  . MALAISE AND FATIGUE 01/15/2007   Ezra Sites, OTR/L  [phone] 12/23/2018, 4:42 PM  [place] [facility] [address] [place], [place], [address] Phone: [phone]   Fax:  [phone]  Name: Kathryn Woods MRN: [id number] Date of Birth: [birth year]

## 2018-12-28 ENCOUNTER — Ambulatory Visit (HOSPITAL_COMMUNITY): Payer: Medicare Other

## 2018-12-28 ENCOUNTER — Other Ambulatory Visit: Payer: Self-pay

## 2018-12-28 DIAGNOSIS — G8929 Other chronic pain: Secondary | ICD-10-CM | POA: Diagnosis not present

## 2018-12-28 DIAGNOSIS — M25511 Pain in right shoulder: Secondary | ICD-10-CM | POA: Diagnosis not present

## 2018-12-28 DIAGNOSIS — R29898 Other symptoms and signs involving the musculoskeletal system: Secondary | ICD-10-CM

## 2018-12-28 DIAGNOSIS — M25611 Stiffness of right shoulder, not elsewhere classified: Secondary | ICD-10-CM | POA: Diagnosis not present

## 2018-12-28 NOTE — Therapy (Signed)
[place], [place], [address] Phone: [phone]   Fax:  [phone]  Occupational Therapy Treatment  Patient Details  Name: Kathryn Woods MRN: [id number] Date of Birth: 13-Mar-1951 Referring Provider (OT): Adonis Huguenin, MD   Encounter Date: 12/28/2018  OT End of Session - 12/28/18 1808    Visit Number  6    Number of Visits  16    Date for OT Re-Evaluation  01/28/19   mini reassessment:12/31/18   Authorization Type  [employer] Medicare; $35 copay (copay waived until 01/06/19)    Authorization Time Period  no visit limit, based on medical necessity    OT Start Time  1734    OT Stop Time  1812    OT Time Calculation (min)  38 min    Activity Tolerance  Patient tolerated treatment well    Behavior During Therapy  [facility] for tasks assessed/performed       Past Medical History:  Diagnosis Date  . Arthritis    lumbar region, spondylolisthesis     Past Surgical History:  Procedure Laterality Date  . BACK SURGERY    . SHOULDER ARTHROSCOPY WITH OPEN ROTATOR CUFF REPAIR Right 10/15/2018   Procedure: SHOULDER ARTHROSCOPY WITH OPEN ROTATOR CUFF REPAIR;  Surgeon: Carole Civil, MD;  Location: [facility];  Service: Orthopedics;  Laterality: Right;  . TUBAL LIGATION      There were no vitals filed for this visit.  Subjective Assessment - 12/28/18 1757    Subjective   S: It's sore. I've been doing stuff with it.    Currently in Pain?  Yes    Pain Score  3     Pain Location  Shoulder    Pain Orientation  Right    Pain Descriptors / Indicators  Sore    Pain Type  Acute pain    Pain Radiating Towards  N/A    Pain Onset  In the past 7 days    Pain Frequency  Constant    Aggravating Factors   increased use    Pain Relieving Factors  ice, heat, pain medication    Effect of Pain on Daily Activities  min effect    Multiple Pain Sites  No         [facility] OT Assessment - 12/28/18 1757      Assessment   Medical  Diagnosis  right shoulder athroscopy debridement and repair      Precautions   Precautions  Shoulder    Type of Shoulder Precautions  progress as tolerated               OT Treatments/Exercises (OP) - 12/28/18 1750      Exercises   Exercises  Shoulder      Shoulder Exercises: Supine   Protraction  PROM;5 reps;Strengthening;12 reps    Protraction Weight (lbs)  1    Horizontal ABduction  PROM;5 reps;Strengthening;12 reps    Horizontal ABduction Weight (lbs)  1    External Rotation  PROM;5 reps;Strengthening;12 reps    External Rotation Weight (lbs)  1    Internal Rotation  PROM;5 reps;Strengthening;12 reps    Internal Rotation Weight (lbs)  1    Flexion  PROM;5 reps;Strengthening;12 reps    Shoulder Flexion Weight (lbs)  1    ABduction  PROM;5 reps;Strengthening;12 reps    Shoulder ABduction Weight (lbs)  1      Shoulder Exercises: Standing   Protraction  Strengthening;10 reps    Protraction  Weight (lbs)  1    Horizontal ABduction  Strengthening;10 reps    Horizontal ABduction Weight (lbs)  1    External Rotation  Strengthening;10 reps    External Rotation Weight (lbs)  1    Internal Rotation  Strengthening;10 reps    Internal Rotation Weight (lbs)  1    Flexion  Strengthening;10 reps    Shoulder Flexion Weight (lbs)  1    ABduction  Strengthening;10 reps    Shoulder ABduction Weight (lbs)  1    Extension  Theraband;10 reps    Theraband Level (Shoulder Extension)  Level 2 (Red)    Row  Theraband;10 reps    Theraband Level (Shoulder Row)  Level 2 (Red)    Retraction  Theraband;10 reps    Theraband Level (Shoulder Retraction)  Level 2 (Red)      Shoulder Exercises: ROM/Strengthening   UBE (Upper Arm Bike)  Level  2' forward 2' reverse   pace: 6.0-8.0   X to V Arms  10X A/ROM    Proximal Shoulder Strengthening, Supine  12X each, 1# weights no rest breaks    Proximal Shoulder Strengthening, Seated  10X each, no rest breaks      Manual Therapy   Manual Therapy   Myofascial release    Manual therapy comments  Manual therapy completed prior to exercises    Myofascial Release  myofascial release and manual stretching completed to right upper arm, trapezius, and scapularis region to decrease fascial restrictions and increase joint mobility in a pain free zone.              OT Education - 12/28/18 1802    Education Details  red theraband scapular exercises.    Person(s) Educated  Patient    Methods  Explanation;Demonstration;Handout;Verbal cues    Comprehension  Returned demonstration;Verbalized understanding       OT Short Term Goals - 12/07/18 1144      OT SHORT TERM GOAL #1   Title  Pt will be provided with and educated on HEP to improve ability to use RUE as dominant during ADLs.     Time  3    Period  Weeks    Status  On-going    Target Date  12/24/18        OT Long Term Goals - 12/07/18 1144      OT LONG TERM GOAL #1   Title  Patient will return to highest level of independence while using her RUE for all daily and household tasks.    Time  8    Period  Weeks    Status  On-going      OT LONG TERM GOAL #2   Title  Patient will increase RUE A/ROM to WNL in order to reach into overhead cabinets with less difficulty.    Time  8    Period  Weeks    Status  On-going      OT LONG TERM GOAL #3   Title  Patient will decrease fascial restrictions to min amount or less in order to increase functional mobilty needed to complete reaching and dressing tasks.    Time  8    Period  Weeks    Status  On-going      OT LONG TERM GOAL #4   Title  Patient will increase her RUE strength to 4+/5 in order to return to completing her required yardwork tasks.    Time  8    Period  Weeks  Status  On-going      OT LONG TERM GOAL #5   Title  Patient will report a decrease in pain of approximately 3/10 or less when completing daily and household chores using her RUE.    Time  8    Period  Weeks    Status  On-going            Plan -  12/28/18 1808    Clinical Impression Statement  A: Pt reports some pain/discomfort along bicep region. Manual techniques were completed to address fascial restrictions. Complete strengthening exercises supine and standing this session. Added scapular strengthening with red band to HEP. VC were provided for form and technique during session.    Body Structure / Function / Physical Skills  ADL;Strength;Pain;UE functional use;IADL;ROM;Fascial restriction    Plan  P: Add overhead lacing.    Consulted and Agree with Plan of Care  Patient       Patient will benefit from skilled therapeutic intervention in order to improve the following deficits and impairments:   Body Structure / Function / Physical Skills: ADL, Strength, Pain, UE functional use, IADL, ROM, Fascial restriction       Visit Diagnosis: Chronic right shoulder pain  Stiffness of right shoulder, not elsewhere classified  Other symptoms and signs involving the musculoskeletal system    Problem List Patient Active Problem List   Diagnosis Date Noted  . Status post rotator cuff repair 10/15/2018 12/21/2018  . Biceps tendon rupture, proximal, right, subsequent encounter   . Primary osteoarthritis of right shoulder   . Labral tear of shoulder, degenerative, right   . Synovitis of right shoulder   . Spondylolisthesis at L3-L4 level 12/10/2016  . Bursitis, shoulder 02/24/2013  . HYPERLIPIDEMIA 02/04/2007  . WRIST SPRAIN, LEFT 02/04/2007  . MIGRAINE HEADACHE 01/15/2007  . BRONCHITIS, CHRONIC NOS 01/15/2007  . CONSTIPATION 01/15/2007  . IBS 01/15/2007  . DISC DISEASE, LUMBOSACRAL SPINE 01/15/2007  . LOW BACK PAIN, CHRONIC 01/15/2007  . MALAISE AND FATIGUE 01/15/2007   Limmie Jisella, OTR/L,CBIS  [phone]  12/28/2018, 6:29 PM  [facility] [address] [place], [place], [address] Phone: [phone]   Fax:  [phone]  Name: Kathryn Woods MRN: [id number] Date  of Birth: [birth year]

## 2018-12-28 NOTE — Patient Instructions (Signed)

## 2018-12-30 ENCOUNTER — Other Ambulatory Visit: Payer: Self-pay

## 2018-12-30 ENCOUNTER — Ambulatory Visit (HOSPITAL_COMMUNITY): Payer: Medicare Other

## 2018-12-30 ENCOUNTER — Encounter (HOSPITAL_COMMUNITY): Payer: Self-pay

## 2018-12-30 DIAGNOSIS — G8929 Other chronic pain: Secondary | ICD-10-CM | POA: Diagnosis not present

## 2018-12-30 DIAGNOSIS — R29898 Other symptoms and signs involving the musculoskeletal system: Secondary | ICD-10-CM

## 2018-12-30 DIAGNOSIS — M25511 Pain in right shoulder: Secondary | ICD-10-CM | POA: Diagnosis not present

## 2018-12-30 DIAGNOSIS — M25611 Stiffness of right shoulder, not elsewhere classified: Secondary | ICD-10-CM | POA: Diagnosis not present

## 2018-12-30 NOTE — Patient Instructions (Signed)
Strengthening: Chest Pull - Resisted   Hold Theraband in front of body with hands about shoulder width a part. Pull band a part and back together slowly. Repeat _10-12___ times. Complete __1__ set(s) per session.. Repeat _1___ session(s) per day.  http://orth.exer.us/926   Copyright  VHI. All rights reserved.   PNF Strengthening: Resisted   Standing with resistive band around each hand, bring right arm up and away, thumb back. Repeat _10-12___ times per set. Do __1__ sets per session. Do __1__ sessions per day.       Resisted External Rotation: in Neutral - Bilateral   Sit or stand, tubing in both hands, elbows at sides, bent to 90, forearms forward. Pinch shoulder blades together and rotate forearms out. Keep elbows at sides. Repeat _10-12___ times per set. Do __1__ sets per session. Do ___1_ sessions per day.  http://orth.exer.us/966   Copyright  VHI. All rights reserved.   PNF Strengthening: Resisted   Standing, hold resistive band above head. Bring right arm down and out from side. Repeat __10-12__ times per set. Do __1__ sets per session. Do __1__ sessions per day.  http://orth.exer.us/922   Copyright  VHI. All rights reserved.   

## 2018-12-30 NOTE — Therapy (Signed)
[facility] [address] [place], [place], [address] Phone: [phone]   Fax:  [phone]  Occupational Therapy Treatment reassessment Patient Details  Name: Kathryn Woods MRN: [id number] Date of Birth: Sep 07, 1950 Referring Provider (OT): Adonis Huguenin, MD   Encounter Date: 12/30/2018  OT End of Session - 12/30/18 1642    Visit Number  7    Number of Visits  16    Date for OT Re-Evaluation  01/28/19   mini reassessment:12/31/18   Authorization Type  [employer] Medicare; $35 copay (copay waived until 01/06/19)    Authorization Time Period  no visit limit, based on medical necessity    OT Start Time  1601    OT Stop Time  1633    OT Time Calculation (min)  32 min    Activity Tolerance  Patient tolerated treatment well    Behavior During Therapy  [facility] for tasks assessed/performed       Past Medical History:  Diagnosis Date  . Arthritis    lumbar region, spondylolisthesis     Past Surgical History:  Procedure Laterality Date  . BACK SURGERY    . SHOULDER ARTHROSCOPY WITH OPEN ROTATOR CUFF REPAIR Right 10/15/2018   Procedure: SHOULDER ARTHROSCOPY WITH OPEN ROTATOR CUFF REPAIR;  Surgeon: Carole Civil, MD;  Location: [facility];  Service: Orthopedics;  Laterality: Right;  . TUBAL LIGATION      There were no vitals filed for this visit.  Subjective Assessment - 12/30/18 1622    Subjective   S: I haven't tried to do any yardwork. I'm sure I could though.    Currently in Pain?  No/denies         [facility] OT Assessment - 12/30/18 1604      Assessment   Medical Diagnosis  right shoulder athroscopy debridement and repair      Precautions   Precautions  Shoulder    Type of Shoulder Precautions  progress as tolerated      Observation/Other Assessments   Focus on Therapeutic Outcomes (FOTO)   70/100      ROM / Strength   AROM / PROM / Strength  AROM;PROM;Strength      Palpation   Palpation comment  min fascial  restrictions noted in the right upper arm, trapezius, and scapularis region.       AROM   Overall AROM Comments  Assessed seated, er/IR adducted    AROM Assessment Site  Shoulder    Right/Left Shoulder  Right    Right Shoulder Flexion  155 Degrees   previous: 115   Right Shoulder ABduction  160 Degrees   previous: 100   Right Shoulder Internal Rotation  90 Degrees   previous: same   Right Shoulder External Rotation  60 Degrees   previous: 30     PROM   Overall PROM Comments  Assessed supine, er/IR adducted    PROM Assessment Site  Shoulder    Right/Left Shoulder  Right    Right Shoulder Flexion  170 Degrees   previous: 130   Right Shoulder ABduction  180 Degrees   previous: 151   Right Shoulder Internal Rotation  90 Degrees   previous: same   Right Shoulder External Rotation  70 Degrees   previous: 46     Strength   Overall Strength Comments  Assessed seated, er/IR adducted    Strength Assessment Site  Shoulder    Right/Left Shoulder  Right    Right Shoulder Flexion  5/5  Right Shoulder ABduction  5/5    Right Shoulder Internal Rotation  5/5    Right Shoulder External Rotation  5/5               OT Treatments/Exercises (OP) - 12/30/18 0001      Exercises   Exercises  Shoulder      Shoulder Exercises: Supine   Protraction  PROM;5 reps    Horizontal ABduction  PROM;5 reps    External Rotation  PROM;5 reps    Internal Rotation  PROM;5 reps    Flexion  PROM;5 reps    ABduction  PROM;5 reps      Shoulder Exercises: Seated   Horizontal ABduction  Theraband;10 reps    Theraband Level (Shoulder Horizontal ABduction)  Level 2 (Red)    External Rotation  Theraband;10 reps    Theraband Level (Shoulder External Rotation)  Level 2 (Red)    Internal Rotation  Theraband;10 reps    Theraband Level (Shoulder Internal Rotation)  Level 2 (Red)    Abduction  Theraband;10 reps    Theraband Level (Shoulder ABduction)  Level 2 (Red)    Other Seated Exercises  red  theraband; adduction; 10X;       Shoulder Exercises: ROM/Strengthening   UBE (Upper Arm Bike)  Level  2' forward 2' reverse   pace: 6.0-8.0            OT Education - 12/30/18 1622    Education Details  red theraband shoulder strengthening    Person(s) Educated  Patient    Methods  Explanation;Demonstration;Handout;Verbal cues    Comprehension  Returned demonstration;Verbalized understanding       OT Short Term Goals - 12/30/18 1628      OT SHORT TERM GOAL #1   Title  Pt will be provided with and educated on HEP to improve ability to use RUE as dominant during ADLs.     Time  3    Period  Weeks    Status  Achieved    Target Date  12/24/18        OT Long Term Goals - 12/30/18 1628      OT LONG TERM GOAL #1   Title  Patient will return to highest level of independence while using her RUE for all daily and household tasks.    Time  8    Period  Weeks    Status  Achieved      OT LONG TERM GOAL #2   Title  Patient will increase RUE A/ROM to WNL in order to reach into overhead cabinets with less difficulty.    Time  8    Period  Weeks    Status  Achieved      OT LONG TERM GOAL #3   Title  Patient will decrease fascial restrictions to min amount or less in order to increase functional mobilty needed to complete reaching and dressing tasks.    Time  8    Period  Weeks    Status  Achieved      OT LONG TERM GOAL #4   Title  Patient will increase her RUE strength to 4+/5 in order to return to completing her required yardwork tasks.    Time  8    Period  Weeks    Status  Achieved      OT LONG TERM GOAL #5   Title  Patient will report a decrease in pain of approximately 3/10 or less when completing daily and household chores using  her RUE.    Time  8    Period  Weeks    Status  Achieved            Plan - 12/30/18 1643    Clinical Impression Statement  A: Mini reassessment completed this date. patient has met all therapy goals at this time. She has normal  ROM both passively and actively. She reports that her pain is low and minimal when using her RUE for daily tasks. She has not attempted her yardwork besides using her weedwacker although she believes she would be fine. Her shoulder strength is a 5/5. HEP was reviewed and updated. At this time, patient is ready to discharge home with a HEP. patient is in agreement.    Body Structure / Function / Physical Skills  ADL;Strength;Pain;UE functional use;IADL;ROM;Fascial restriction    Plan  P: D/C from OT Services with HEP.    Consulted and Agree with Plan of Care  Patient       Patient will benefit from skilled therapeutic intervention in order to improve the following deficits and impairments:   Body Structure / Function / Physical Skills: ADL, Strength, Pain, UE functional use, IADL, ROM, Fascial restriction       Visit Diagnosis: Other symptoms and signs involving the musculoskeletal system  Chronic right shoulder pain  Stiffness of right shoulder, not elsewhere classified    Problem List Patient Active Problem List   Diagnosis Date Noted  . Status post rotator cuff repair 10/15/2018 12/21/2018  . Biceps tendon rupture, proximal, right, subsequent encounter   . Primary osteoarthritis of right shoulder   . Labral tear of shoulder, degenerative, right   . Synovitis of right shoulder   . Spondylolisthesis at L3-L4 level 12/10/2016  . Bursitis, shoulder 02/24/2013  . HYPERLIPIDEMIA 02/04/2007  . WRIST SPRAIN, LEFT 02/04/2007  . MIGRAINE HEADACHE 01/15/2007  . BRONCHITIS, CHRONIC NOS 01/15/2007  . CONSTIPATION 01/15/2007  . IBS 01/15/2007  . [place] DISEASE, LUMBOSACRAL SPINE 01/15/2007  . LOW BACK PAIN, CHRONIC 01/15/2007  . MALAISE AND FATIGUE 01/15/2007     OCCUPATIONAL THERAPY DISCHARGE SUMMARY  Visits from Start of Care: 7 Current functional level related to goals / functional outcomes: See above   Remaining deficits: See above   Education / Equipment: See  above Plan: Patient agrees to discharge.  Patient goals were met. Patient is being discharged due to meeting the stated rehab goals.  ?????          Ailene Ravel, OTR/L,CBIS  [phone]  12/30/2018, 4:49 PM  [facility] [address] [place], [place], [address] Phone: [phone]   Fax:  [phone]  Name: Kathryn Woods MRN: [id number] Date of Birth: Jan 16, 1951

## 2019-01-05 ENCOUNTER — Ambulatory Visit (HOSPITAL_COMMUNITY): Payer: Medicare Other | Admitting: Occupational Therapy

## 2019-01-06 ENCOUNTER — Encounter: Payer: Self-pay | Admitting: Orthopedic Surgery

## 2019-01-07 ENCOUNTER — Encounter (HOSPITAL_COMMUNITY): Payer: Medicare Other

## 2019-01-11 ENCOUNTER — Encounter (HOSPITAL_COMMUNITY): Payer: Medicare Other

## 2019-01-11 DIAGNOSIS — M545 Low back pain: Secondary | ICD-10-CM | POA: Diagnosis not present

## 2019-01-11 DIAGNOSIS — G8929 Other chronic pain: Secondary | ICD-10-CM | POA: Diagnosis not present

## 2019-01-18 ENCOUNTER — Ambulatory Visit: Payer: Medicare Other | Admitting: Orthopedic Surgery

## 2019-01-27 ENCOUNTER — Other Ambulatory Visit: Payer: Self-pay

## 2019-01-27 ENCOUNTER — Encounter: Payer: Self-pay | Admitting: Orthopedic Surgery

## 2019-01-27 ENCOUNTER — Ambulatory Visit (INDEPENDENT_AMBULATORY_CARE_PROVIDER_SITE_OTHER): Payer: Medicare Other | Admitting: Orthopedic Surgery

## 2019-01-27 VITALS — BP 125/90 | HR 91 | Ht 66.0 in | Wt 174.0 lb

## 2019-01-27 DIAGNOSIS — M25561 Pain in right knee: Secondary | ICD-10-CM

## 2019-01-27 DIAGNOSIS — G8929 Other chronic pain: Secondary | ICD-10-CM | POA: Diagnosis not present

## 2019-01-27 DIAGNOSIS — Z9889 Other specified postprocedural states: Secondary | ICD-10-CM

## 2019-01-27 MED ORDER — HYDROCODONE-ACETAMINOPHEN 5-325 MG PO TABS: 1.0000 | ORAL_TABLET | Freq: Four times a day (QID) | ORAL | 0 refills | Status: AC | PRN

## 2019-01-27 NOTE — Progress Notes (Signed)
NEW PROBLEM OFFICE VISIT  Chief Complaint  Patient presents with  . Knee Pain    right / since fall June 28th     68 year old female fell June 28 eventually had right shoulder surgery she has a history of a previous lumbar fusion presents with pain in her right knee swelling catching locking with pain over the quadriceps tendon lateral and anterior joint line.  Once the knee locks she has to manually unlock it   Review of Systems  Constitutional: Negative for chills and fever.  Musculoskeletal: Positive for back pain and joint pain.  Neurological: Positive for tingling.     Past Medical History:  Diagnosis Date  . Arthritis    lumbar region, spondylolisthesis     Past Surgical History:  Procedure Laterality Date  . BACK SURGERY    . SHOULDER ARTHROSCOPY WITH OPEN ROTATOR CUFF REPAIR Right 10/15/2018   Procedure: SHOULDER ARTHROSCOPY WITH OPEN ROTATOR CUFF REPAIR;  Surgeon: Vickki Hearing, MD;  Location: [facility];  Service: Orthopedics;  Laterality: Right;  . TUBAL LIGATION      Family History  Problem Relation Age of Onset  . Heart attack Mother   . Cancer Father        lung   Social History   Tobacco Use  . Smoking status: Former Smoker    Quit date: 12/06/1982    Years since quitting: 36.1  . Smokeless tobacco: Never Used  Substance Use Topics  . Alcohol use: No  . Drug use: Yes    Types: Marijuana    Comment: a couple times- for pain relief, pt. informed to not use anymore before surgery     Allergies  Allergen Reactions  . Aspirin Other (See Comments)    "Messes stomach up real bad"    Current Meds  Medication Sig  . ergocalciferol (VITAMIN D2) 1.25 MG (50000 UT) capsule Take 50,000 Units by mouth once a week.  Marland Kitchen HYDROcodone-acetaminophen (NORCO/VICODIN) 5-325 MG tablet Take 1 tablet by mouth every 6 (six) hours as needed for up to 5 days for moderate pain.  . [DISCONTINUED] HYDROcodone-acetaminophen (NORCO/VICODIN) 5-325 MG tablet Take 1 tablet by  mouth every 6 (six) hours as needed for moderate pain.    BP 125/90   Pulse 91   Ht 5\' 6"  (1.676 m)   Wt 174 lb (78.9 kg)   BMI 28.08 kg/m   Physical Exam She is awake and alert she is walking she has on a back brace she is well-developed small frame mood and affect normal    Ortho Exam Right knee is swollen with a joint effusion mild to moderate she has 90 degrees of knee flexion 5 degrees of knee extension knee feels stable muscle tone is normal skin is intact pulses good sensation is normal she has a positive McMurray sign a clicking sensation with that as well  Left knee no tenderness skins normal good strength normal stability normal range of motion  MEDICAL DECISION SECTION  Xrays were done at [facility] My independent reading of xrays:  Arthritis lateral compartment reasonable alignment  Encounter Diagnoses  Name Primary?  . Chronic pain of right knee Yes  . Status post rotator cuff repair 10/15/2018     PLAN: (Rx., injectx, surgery, frx, mri/ct) Torn meniscus chronic knee pain recommend MRI  Meds ordered this encounter  Medications  . HYDROcodone-acetaminophen (NORCO/VICODIN) 5-325 MG tablet    Sig: Take 1 tablet by mouth every 6 (six) hours as needed for up to  5 days for moderate pain.    Dispense:  20 tablet    Refill:  0    Arther Abbott, MD  01/27/2019 4:24 PM

## 2019-01-27 NOTE — Patient Instructions (Signed)
We will obtain pre-certification from the insurer and call you to schedule the study. Dr Harrison will call you with the results    

## 2019-02-01 ENCOUNTER — Encounter: Payer: Self-pay | Admitting: Orthopedic Surgery

## 2019-02-01 ENCOUNTER — Other Ambulatory Visit: Payer: Self-pay

## 2019-02-01 ENCOUNTER — Ambulatory Visit (INDEPENDENT_AMBULATORY_CARE_PROVIDER_SITE_OTHER): Payer: Medicare Other | Admitting: Orthopedic Surgery

## 2019-02-01 VITALS — BP 107/79 | HR 88 | Temp 97.3°F | Ht 65.0 in | Wt 174.0 lb

## 2019-02-01 DIAGNOSIS — R748 Abnormal levels of other serum enzymes: Secondary | ICD-10-CM | POA: Diagnosis not present

## 2019-02-01 DIAGNOSIS — E7849 Other hyperlipidemia: Secondary | ICD-10-CM | POA: Diagnosis not present

## 2019-02-01 DIAGNOSIS — Z9889 Other specified postprocedural states: Secondary | ICD-10-CM

## 2019-02-01 DIAGNOSIS — E559 Vitamin D deficiency, unspecified: Secondary | ICD-10-CM | POA: Diagnosis not present

## 2019-02-01 NOTE — Progress Notes (Signed)
Chief Complaint  Patient presents with  . Follow-up    Recheck on right shoulder, DOS 10-15-18.   Patient happy with the right shoulder no complaints of pain  Status post rotator cuff repair right shoulder patient has regained full range of motion and strength of the right shoulder  We are following her for her knee waiting for MRI and will see her after that but for the shoulder she can be released

## 2019-02-04 DIAGNOSIS — E559 Vitamin D deficiency, unspecified: Secondary | ICD-10-CM | POA: Diagnosis not present

## 2019-02-04 DIAGNOSIS — E7849 Other hyperlipidemia: Secondary | ICD-10-CM | POA: Diagnosis not present

## 2019-02-05 ENCOUNTER — Other Ambulatory Visit: Payer: Self-pay

## 2019-02-05 ENCOUNTER — Ambulatory Visit (HOSPITAL_COMMUNITY)
Admission: RE | Admit: 2019-02-05 | Discharge: 2019-02-05 | Disposition: A | Discharge status: Home / Self Care | Payer: Medicare Other | Source: Ambulatory Visit | Attending: Orthopedic Surgery | Admitting: Orthopedic Surgery

## 2019-02-05 DIAGNOSIS — M25461 Effusion, right knee: Secondary | ICD-10-CM | POA: Diagnosis not present

## 2019-02-05 DIAGNOSIS — G8929 Other chronic pain: Secondary | ICD-10-CM | POA: Diagnosis not present

## 2019-02-05 DIAGNOSIS — M25561 Pain in right knee: Secondary | ICD-10-CM | POA: Insufficient documentation

## 2019-02-05 DIAGNOSIS — S83281A Other tear of lateral meniscus, current injury, right knee, initial encounter: Secondary | ICD-10-CM | POA: Diagnosis not present

## 2019-02-15 ENCOUNTER — Telehealth: Payer: Self-pay | Admitting: Orthopedic Surgery

## 2019-02-15 NOTE — Telephone Encounter (Signed)
Patient called and stated that she had her MRI done on the 30th.  She would like to get the results.  Do you want to call her with the results or have her come in the office?  Please advise  Thanks

## 2019-02-16 ENCOUNTER — Telehealth: Payer: Self-pay | Admitting: Orthopedic Surgery

## 2019-02-16 ENCOUNTER — Other Ambulatory Visit: Payer: Self-pay | Admitting: Orthopedic Surgery

## 2019-02-16 ENCOUNTER — Telehealth: Payer: Self-pay | Admitting: Radiology

## 2019-02-16 NOTE — Telephone Encounter (Signed)
Patient needs SARK lateral meniscectomy per Dr Aline Brochure I have called her to give times for surgery

## 2019-02-16 NOTE — Telephone Encounter (Signed)
Results given  MRI shows severe arthritis of the knee especially the patellofemoral lateral compartments and a severely torn lateral meniscus  Patient indicates she had no symptoms until she fell  Arthritis of course had been brewing but probably tore the meniscus when she fell  Recommend arthroscopic surgery versus medical treatment versus more extensive surgery but since she was asymptomatic recommend arthroscopic is the best option she agrees we will schedule it at her convenience in her convenience

## 2019-02-16 NOTE — Addendum Note (Signed)
Addended byCandice Camp on: 02/16/2019 04:39 PM   Modules accepted: Orders, SmartSet

## 2019-02-17 ENCOUNTER — Telehealth: Payer: Self-pay | Admitting: Orthopedic Surgery

## 2019-02-17 NOTE — Telephone Encounter (Signed)
Left message for her to call me back. 

## 2019-02-17 NOTE — Telephone Encounter (Signed)
Tylenol 1000 mg evry 8 hours

## 2019-02-17 NOTE — Telephone Encounter (Signed)
I have advised, she voiced understanding  

## 2019-02-17 NOTE — Telephone Encounter (Signed)
Patient called to ask if Dr Aline Brochure would order medication to help with the pain; aware of surgery scheduled next week, 02/25/19; if so, patient uses [facility], [place].

## 2019-02-17 NOTE — Telephone Encounter (Signed)
Done (separate documentation entered)

## 2019-02-22 NOTE — Patient Instructions (Signed)
Kathryn Woods  02/22/2019     @   Your procedure is scheduled on  02/25/2019 .  Report to [facility] at  [phone]  A.M.  Call this number if you have problems the morning of surgery:  [phone]   Remember:  Do not eat or drink after midnight.                      Take these medicines the morning of surgery with A SIP OF WATER None    Do not wear jewelry, make-up or nail polish.  Do not wear lotions, powders, or perfumes. Please wear deodorant and brush your teeth.  Do not shave 48 hours prior to surgery.  Men may shave face and neck.  Do not bring valuables to the hospital.  [facility] is not responsible for any belongings or valuables.  Contacts, dentures or bridgework may not be worn into surgery.  Leave your suitcase in the car.  After surgery it may be brought to your room.  For patients admitted to the hospital, discharge time will be determined by your treatment team.  Patients discharged the day of surgery will not be allowed to drive home.   Name and phone number of your driver:   family Special instructions:  None  Please read over the following fact sheets that you were given. Anesthesia Post-op Instructions and Care and Recovery After Surgery       Arthroscopic Knee Ligament Repair, Care After This sheet gives you information about how to care for yourself after your procedure. Your health care provider may also give you more specific instructions. If you have problems or questions, contact your health care provider. What can I expect after the procedure? After the procedure, it is common to have:  Pain in your knee.  Bruising and swelling on your knee, calf, and ankle for 3-4 days.  Fatigue. Follow these instructions at home: If you have a brace or immobilizer:  Wear the brace or immobilizer as told by your health care provider. Remove it only as told by your health care provider.  Loosen the splint or  immobilizer if your toes tingle, become numb, or turn cold and blue.  Keep the brace or immobilizer clean. Bathing  Do not take baths, swim, or use a hot tub until your health care provider approves. Ask your health care provider if you can take showers.  Keep your bandage (dressing) dry until your health care provider says that it can be removed. Cover it and your brace or immobilizer with a watertight covering when you take a shower. Incision care   Follow instructions from your health care provider about how to take care of your incision. Make sure you: ? Wash your hands with soap and water before you change your bandage (dressing). If soap and water are not available, use hand sanitizer. ? Change your dressing as told by your health care provider. ? Leave stitches (sutures), skin glue, or adhesive strips in place. These skin closures may need to stay in place for 2 weeks or longer. If adhesive strip edges start to loosen and curl up, you may trim the loose edges. Do not remove adhesive strips completely unless your health care provider tells you to do that.  Check your incision area every day for signs of infection. Check for: ? More redness, swelling, or pain. ? More fluid or blood. ? Warmth. ? Pus or  a bad smell. Managing pain, stiffness, and swelling   If directed, put ice on the affected area. ? If you have a removable brace or immobilizer, remove it as told by your health care provider. ? Put ice in a plastic bag. ? Place a towel between your skin and the bag or between your brace or immobilizer and the bag. ? Leave the ice on for 20 minutes, 2-3 times a day.  Move your toes often to avoid stiffness and to lessen swelling.  Raise (elevate) the injured area above the level of your heart while you are sitting or lying down. Driving  Do not drive until your health care provider approves. If you have a brace or immobilizer on your leg, ask your health care provider when it is  safe for you to drive.  Do not drive or use heavy machinery while taking prescription pain medicine. Activity  Rest as directed. Ask your health care provider what activities are safe for you.  Do physical therapy exercises as told by your health care provider. Physical therapy will help you regain strength and motion in your knee.  Follow instructions from your health care provider about: ? When you may start motion exercises. ? When you may start riding a stationary bike and doing other low-impact activities. ? When you may start to jog and do other high-impact activities. Safety  Do not use the injured limb to support your body weight until your health care provider says that you can. Use crutches as told by your health care provider. General instructions  Do not use any products that contain nicotine or tobacco, such as cigarettes and e-cigarettes. These can delay bone healing. If you need help quitting, ask your health care provider.  To prevent or treat constipation while you are taking prescription pain medicine, your health care provider may recommend that you: ? Drink enough fluid to keep your urine clear or pale yellow. ? Take over-the-counter or prescription medicines. ? Eat foods that are high in fiber, such as fresh fruits and vegetables, whole grains, and beans. ? Limit foods that are high in fat and processed sugars, such as fried and sweet foods.  Take over-the-counter and prescription medicines only as told by your health care provider.  Keep all follow-up visits as told by your health care provider. This is important. Contact a health care provider if:  You have more redness, swelling, or pain around an incision.  You have more fluid or blood coming from an incision.  Your incision feels warm to the touch.  You have a fever.  You have pain or swelling in your knee, and it gets worse.  You have pain that does not get better with medicine. Get help right away  if:  You have trouble breathing.  You have pus or a bad smell coming from an incision.  You have numbness and tingling near the knee joint. Summary  After the procedure, it is common to have knee pain with bruising and swelling on your knee, calf, and ankle.  Icing your knee and raising your leg above the level of your heart will help control the pain and the swelling.  Do physical therapy exercises as told by your health care provider. Physical therapy will help you regain strength and motion in your knee. This information is not intended to replace advice given to you by your health care provider. Make sure you discuss any questions you have with your health care provider. Document Released: 01/13/2013  Document Revised: 03/07/2017 Document Reviewed: 03/19/2016 Elsevier Patient Education  2020 [employer]  General Anesthesia, Adult, Care After This sheet gives you information about how to care for yourself after your procedure. Your health care provider may also give you more specific instructions. If you have problems or questions, contact your health care provider. What can I expect after the procedure? After the procedure, the following side effects are common:  Pain or discomfort at the IV site.  Nausea.  Vomiting.  Sore throat.  Trouble concentrating.  Feeling cold or chills.  Weak or tired.  Sleepiness and fatigue.  Soreness and body aches. These side effects can affect parts of the body that were not involved in surgery. Follow these instructions at home:  For at least 24 hours after the procedure:  Have a responsible adult stay with you. It is important to have someone help care for you until you are awake and alert.  Rest as needed.  Do not: ? Participate in activities in which you could fall or become injured. ? Drive. ? Use heavy machinery. ? Drink alcohol. ? Take sleeping pills or medicines that cause drowsiness. ? Make important decisions or sign  legal documents. ? Take care of children on your own. Eating and drinking  Follow any instructions from your health care provider about eating or drinking restrictions.  When you feel hungry, start by eating small amounts of foods that are soft and easy to digest (bland), such as toast. Gradually return to your regular diet.  Drink enough fluid to keep your urine pale yellow.  If you vomit, rehydrate by drinking water, juice, or clear broth. General instructions  If you have sleep apnea, surgery and certain medicines can increase your risk for breathing problems. Follow instructions from your health care provider about wearing your sleep device: ? Anytime you are sleeping, including during daytime naps. ? While taking prescription pain medicines, sleeping medicines, or medicines that make you drowsy.  Return to your normal activities as told by your health care provider. Ask your health care provider what activities are safe for you.  Take over-the-counter and prescription medicines only as told by your health care provider.  If you smoke, do not smoke without supervision.  Keep all follow-up visits as told by your health care provider. This is important. Contact a health care provider if:  You have nausea or vomiting that does not get better with medicine.  You cannot eat or drink without vomiting.  You have pain that does not get better with medicine.  You are unable to pass urine.  You develop a skin rash.  You have a fever.  You have redness around your IV site that gets worse. Get help right away if:  You have difficulty breathing.  You have chest pain.  You have blood in your urine or stool, or you vomit blood. Summary  After the procedure, it is common to have a sore throat or nausea. It is also common to feel tired.  Have a responsible adult stay with you for the first 24 hours after general anesthesia. It is important to have someone help care for you until  you are awake and alert.  When you feel hungry, start by eating small amounts of foods that are soft and easy to digest (bland), such as toast. Gradually return to your regular diet.  Drink enough fluid to keep your urine pale yellow.  Return to your normal activities as told by your health care provider.  Ask your health care provider what activities are safe for you. This information is not intended to replace advice given to you by your health care provider. Make sure you discuss any questions you have with your health care provider. Document Released: 07/01/2000 Document Revised: 03/28/2017 Document Reviewed: 11/08/2016 Elsevier Patient Education  2020 [employer]. How to Use Chlorhexidine for Bathing Chlorhexidine gluconate (CHG) is a germ-killing (antiseptic) solution that is used to clean the skin. It can get rid of the bacteria that normally live on the skin and can keep them away for about 24 hours. To clean your skin with CHG, you may be given:  A CHG solution to use in the shower or as part of a sponge bath.  A prepackaged cloth that contains CHG. Cleaning your skin with CHG may help lower the risk for infection:  While you are staying in the intensive care unit of the hospital.  If you have a vascular access, such as a central line, to provide short-term or long-term access to your veins.  If you have a catheter to drain urine from your bladder.  If you are on a ventilator. A ventilator is a machine that helps you breathe by moving air in and out of your lungs.  After surgery. What are the risks? Risks of using CHG include:  A skin reaction.  Hearing loss, if CHG gets in your ears.  Eye injury, if CHG gets in your eyes and is not rinsed out.  The CHG product catching fire. Make sure that you avoid smoking and flames after applying CHG to your skin. Do not use CHG:  If you have a chlorhexidine allergy or have previously reacted to chlorhexidine.  On babies  younger than 54 months of age. How to use CHG solution  Use CHG only as told by your health care provider, and follow the instructions on the label.  Use the full amount of CHG as directed. Usually, this is one bottle. During a shower Follow these steps when using CHG solution during a shower (unless your health care provider gives you different instructions): 1. Start the shower. 2. Use your normal soap and shampoo to wash your face and hair. 3. Turn off the shower or move out of the shower stream. 4. Pour the CHG onto a clean washcloth. Do not use any type of brush or rough-edged sponge. 5. Starting at your neck, lather your body down to your toes. Make sure you follow these instructions: ? If you will be having surgery, pay special attention to the part of your body where you will be having surgery. Scrub this area for at least 1 minute. ? Do not use CHG on your head or face. If the solution gets into your ears or eyes, rinse them well with water. ? Avoid your genital area. ? Avoid any areas of skin that have broken skin, cuts, or scrapes. ? Scrub your back and under your arms. Make sure to wash skin folds. 6. Let the lather sit on your skin for 1-2 minutes or as long as told by your health care provider. 7. Thoroughly rinse your entire body in the shower. Make sure that all body creases and crevices are rinsed well. 8. Dry off with a clean towel. Do not put any substances on your body afterward-such as powder, lotion, or perfume-unless you are told to do so by your health care provider. Only use lotions that are recommended by the manufacturer. 9. Put on clean clothes or pajamas.  10. If it is the night before your surgery, sleep in clean sheets.  During a sponge bath Follow these steps when using CHG solution during a sponge bath (unless your health care provider gives you different instructions): 1. Use your normal soap and shampoo to wash your face and hair. 2. Pour the CHG onto a  clean washcloth. 3. Starting at your neck, lather your body down to your toes. Make sure you follow these instructions: ? If you will be having surgery, pay special attention to the part of your body where you will be having surgery. Scrub this area for at least 1 minute. ? Do not use CHG on your head or face. If the solution gets into your ears or eyes, rinse them well with water. ? Avoid your genital area. ? Avoid any areas of skin that have broken skin, cuts, or scrapes. ? Scrub your back and under your arms. Make sure to wash skin folds. 4. Let the lather sit on your skin for 1-2 minutes or as long as told by your health care provider. 5. Using a different clean, wet washcloth, thoroughly rinse your entire body. Make sure that all body creases and crevices are rinsed well. 6. Dry off with a clean towel. Do not put any substances on your body afterward-such as powder, lotion, or perfume-unless you are told to do so by your health care provider. Only use lotions that are recommended by the manufacturer. 7. Put on clean clothes or pajamas. 8. If it is the night before your surgery, sleep in clean sheets. How to use CHG prepackaged cloths  Only use CHG cloths as told by your health care provider, and follow the instructions on the label.  Use the CHG cloth on clean, dry skin.  Do not use the CHG cloth on your head or face unless your health care provider tells you to.  When washing with the CHG cloth: ? Avoid your genital area. ? Avoid any areas of skin that have broken skin, cuts, or scrapes. Before surgery Follow these steps when using a CHG cloth to clean before surgery (unless your health care provider gives you different instructions): 1. Using the CHG cloth, vigorously scrub the part of your body where you will be having surgery. Scrub using a back-and-forth motion for 3 minutes. The area on your body should be completely wet with CHG when you are done scrubbing. 2. Do not rinse.  Discard the cloth and let the area air-dry. Do not put any substances on the area afterward, such as powder, lotion, or perfume. 3. Put on clean clothes or pajamas. 4. If it is the night before your surgery, sleep in clean sheets.  For general bathing Follow these steps when using CHG cloths for general bathing (unless your health care provider gives you different instructions). 1. Use a separate CHG cloth for each area of your body. Make sure you wash between any folds of skin and between your fingers and toes. Wash your body in the following order, switching to a new cloth after each step: ? The front of your neck, shoulders, and chest. ? Both of your arms, under your arms, and your hands. ? Your stomach and groin area, avoiding the genitals. ? Your right leg and foot. ? Your left leg and foot. ? The back of your neck, your back, and your buttocks. 2. Do not rinse. Discard the cloth and let the area air-dry. Do not put any substances on your body afterward-such as  powder, lotion, or perfume-unless you are told to do so by your health care provider. Only use lotions that are recommended by the manufacturer. 3. Put on clean clothes or pajamas. Contact a health care provider if:  Your skin gets irritated after scrubbing.  You have questions about using your solution or cloth. Get help right away if:  Your eyes become very red or swollen.  Your eyes itch badly.  Your skin itches badly and is red or swollen.  Your hearing changes.  You have trouble seeing.  You have swelling or tingling in your mouth or throat.  You have trouble breathing.  You swallow any chlorhexidine. Summary  Chlorhexidine gluconate (CHG) is a germ-killing (antiseptic) solution that is used to clean the skin. Cleaning your skin with CHG may help to lower your risk for infection.  You may be given CHG to use for bathing. It may be in a bottle or in a prepackaged cloth to use on your skin. Carefully follow  your health care provider's instructions and the instructions on the product label.  Do not use CHG if you have a chlorhexidine allergy.  Contact your health care provider if your skin gets irritated after scrubbing. This information is not intended to replace advice given to you by your health care provider. Make sure you discuss any questions you have with your health care provider. Document Released: 12/18/2011 Document Revised: 06/11/2018 Document Reviewed: 02/20/2017 Elsevier Patient Education  2020 [employer].

## 2019-02-24 ENCOUNTER — Other Ambulatory Visit: Payer: Self-pay

## 2019-02-24 ENCOUNTER — Encounter (HOSPITAL_COMMUNITY): Payer: Self-pay

## 2019-02-24 ENCOUNTER — Encounter (HOSPITAL_COMMUNITY)
Admission: RE | Admit: 2019-02-24 | Discharge: 2019-02-24 | Disposition: A | Discharge status: Home / Self Care | Payer: Medicare Other | Source: Ambulatory Visit | Attending: Orthopedic Surgery | Admitting: Orthopedic Surgery

## 2019-02-24 ENCOUNTER — Other Ambulatory Visit (HOSPITAL_COMMUNITY)
Admission: RE | Admit: 2019-02-24 | Discharge: 2019-02-24 | Disposition: A | Discharge status: Home / Self Care | Payer: Medicare Other | Source: Ambulatory Visit | Attending: Orthopedic Surgery | Admitting: Orthopedic Surgery

## 2019-02-24 DIAGNOSIS — Z01812 Encounter for preprocedural laboratory examination: Secondary | ICD-10-CM | POA: Diagnosis not present

## 2019-02-24 DIAGNOSIS — S83281A Other tear of lateral meniscus, current injury, right knee, initial encounter: Secondary | ICD-10-CM | POA: Diagnosis not present

## 2019-02-24 DIAGNOSIS — M1711 Unilateral primary osteoarthritis, right knee: Secondary | ICD-10-CM | POA: Diagnosis not present

## 2019-02-24 LAB — BASIC METABOLIC PANEL
Anion gap: 11 (ref 5–15)
BUN: 11 mg/dL (ref 8–23)
CO2: 24 mmol/L (ref 22–32)
Calcium: 9.3 mg/dL (ref 8.9–10.3)
Chloride: 106 mmol/L (ref 98–111)
Creatinine, Ser: 0.93 mg/dL (ref 0.44–1.00)
GFR calc Af Amer: >60 mL/min (ref >60)
GFR calc non Af Amer: >60 mL/min (ref >60)
Glucose, Bld: 105 mg/dL — ABNORMAL HIGH (ref 70–99)
Potassium: 3.4 mmol/L — ABNORMAL LOW (ref 3.5–5.1)
Sodium: 141 mmol/L (ref 135–145)

## 2019-02-24 LAB — CBC
HCT: 39.7 % (ref 36.0–46.0)
Hemoglobin: 12.7 g/dL (ref 12.0–15.0)
MCH: 29.3 pg (ref 26.0–34.0)
MCHC: 32 g/dL (ref 30.0–36.0)
MCV: 91.5 fL (ref 80.0–100.0)
Platelets: 309 10*3/uL (ref 150–400)
RBC: 4.34 MIL/uL (ref 3.87–5.11)
RDW: 14.3 % (ref 11.5–15.5)
WBC: 7.2 10*3/uL (ref 4.0–10.5)
nRBC: 0 % (ref 0.0–0.2)

## 2019-02-24 LAB — SARS CORONAVIRUS 2 (TAT 6-24 HRS): SARS Coronavirus 2: NEGATIVE

## 2019-02-24 NOTE — H&P (Signed)
Kathryn Woods  02/24/2019     HISTORY SECTION :  Chief complaint pain right knee  HPI The patient presents for preop for surgery on the right knee (mild/moderate/severe/ ) moderate pain, in the (right /left) right knee, for 6 months, associated with painful gait painful range of motion.  Prior treatment rest activity modification  Apparently she fell at a [employer] injured her shoulder and knee.  She had the shoulder repaired and still has residual knee pain which prompted an MRI which showed a torn meniscus and arthritis of the knee   Review of Systems  Musculoskeletal: Positive for joint pain.  All other systems reviewed and are negative.  Family History  Problem Relation Age of Onset  . Heart attack Mother   . Cancer Father        lung    Social History   Tobacco Use  . Smoking status: Former Smoker    Quit date: 12/06/1982    Years since quitting: 36.2  . Smokeless tobacco: Never Used  Substance Use Topics  . Alcohol use: No  . Drug use: Yes    Types: Marijuana    Comment: a couple times- for pain relief, pt. informed to not use anymore before surgery       has a past medical history of Arthritis.   Past Surgical History:  Procedure Laterality Date  . BACK SURGERY    . SHOULDER ARTHROSCOPY WITH OPEN ROTATOR CUFF REPAIR Right 10/15/2018   Procedure: SHOULDER ARTHROSCOPY WITH OPEN ROTATOR CUFF REPAIR;  Surgeon: Vickki Hearing, MD;  Location: [facility];  Service: Orthopedics;  Laterality: Right;  . TUBAL LIGATION      There is no height or weight on file to calculate BMI.   Allergies  Allergen Reactions  . Aspirin Other (See Comments)    "Messes stomach up real bad"    No current facility-administered medications for this encounter.  No current outpatient medications on file.   PHYSICAL EXAM SECTION: 1) There were no vitals taken for this visit.  There is no height or weight on file to calculate BMI. General appearance:  Well-developed well-nourished no gross deformities  2) Cardiovascular normal pulse and perfusion in the lower extremities normal color without edema  3) Neurologically deep tendon reflexes are equal and normal, no sensation loss or deficits no pathologic reflexes  4) Psychological: Awake alert and oriented x3 mood and affect normal  5) Skin no lacerations or ulcerations no nodularity no palpable masses, no erythema or nodularity  6) Musculoskeletal:   Right knee skin is normal no erythema redness or tenderness  Lateral joint line is tender alignment is normal  Range of motion remains painful with ligamentous stability confirmed by stress testing  Provocative tests for meniscus include Apley's test negative McMurray's test positive screw home test negative   MEDICAL DECISION SECTION:  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 Persistent knee pain.   EXAM: MRI OF THE RIGHT KNEE WITHOUT CONTRAST   TECHNIQUE: Multiplanar, multisequence MR imaging of the knee was performed. No intravenous contrast was administered.   COMPARISON:  Radiograph 10/05/2018   FINDINGS: MENISCI   Medial meniscus:  Intact.   Lateral meniscus: Severely degenerated and torn. The entire posterior horn and midbody regions have a macerated appearance.   LIGAMENTS   Cruciates:  Intact   Collaterals:  Intact   CARTILAGE   Patellofemoral: Advanced degenerative chondrosis with areas of  full thickness cartilage loss, joint space narrowing and spurring.   Medial: Advanced degenerative chondrosis with areas of full-thickness cartilage loss, joint space narrowing and spurring.   Lateral: Severe degenerative chondrosis with full-thickness cartilage loss, joint space narrowing, osteophytic spurring and subchondral cystic change   Joint:  Large joint effusion.   Popliteal Fossa:  No popliteal mass.  Very  small Baker's cyst.   Extensor Mechanism: The patella retinacular structures are intact and the quadriceps and patellar tendons are intact. Mild patella Alta.   Bones:  No acute bony findings.   Other: Normal knee musculature.   IMPRESSION: 1. Severely degenerated and extensively torn lateral meniscus. 2. Significant tricompartmental degenerative changes most severe in the lateral compartment. 3. Intact ligamentous structures and no acute bony findings. 4. Large joint effusion.     Electronically Signed   By: Marijo Sanes M.D.   On: 02/06/2019 12:57  Plan:  (Rx., Inj., surg., Frx, MRI/CT, XR:2)  Arthroscopy right knee lateral meniscectomy  The procedure has been fully reviewed with the patient; The risks and benefits of surgery have been discussed and explained and understood. Alternative treatment has also been reviewed, questions were encouraged and answered. The postoperative plan is also been reviewed. We discussed that she will could have pain related to the arthritic changes in the knee which are not acute but chronic  We suspect that the lateral meniscus tear is acute based on the patient's symptoms and history  9:55 AM Arther Abbott, MD  02/24/2019

## 2019-02-25 ENCOUNTER — Encounter (HOSPITAL_COMMUNITY): Payer: Self-pay | Admitting: *Deleted

## 2019-02-25 ENCOUNTER — Ambulatory Visit (HOSPITAL_COMMUNITY)
Admission: RE | Admit: 2019-02-25 | Discharge: 2019-02-25 | Disposition: A | Discharge status: Home / Self Care | Payer: Medicare Other | Attending: Orthopedic Surgery | Admitting: Orthopedic Surgery

## 2019-02-25 ENCOUNTER — Other Ambulatory Visit: Payer: Self-pay

## 2019-02-25 ENCOUNTER — Encounter (HOSPITAL_COMMUNITY)
Admission: RE | Disposition: A | Discharge status: Home / Self Care | Payer: Self-pay | Source: Home / Self Care | Attending: Orthopedic Surgery

## 2019-02-25 ENCOUNTER — Ambulatory Visit (HOSPITAL_COMMUNITY): Payer: Medicare Other | Admitting: Anesthesiology

## 2019-02-25 DIAGNOSIS — S83271D Complex tear of lateral meniscus, current injury, right knee, subsequent encounter: Secondary | ICD-10-CM | POA: Diagnosis not present

## 2019-02-25 DIAGNOSIS — Z87891 Personal history of nicotine dependence: Secondary | ICD-10-CM | POA: Diagnosis not present

## 2019-02-25 DIAGNOSIS — M1711 Unilateral primary osteoarthritis, right knee: Secondary | ICD-10-CM | POA: Diagnosis not present

## 2019-02-25 DIAGNOSIS — W19XXXA Unspecified fall, initial encounter: Secondary | ICD-10-CM | POA: Diagnosis not present

## 2019-02-25 DIAGNOSIS — S83281A Other tear of lateral meniscus, current injury, right knee, initial encounter: Secondary | ICD-10-CM | POA: Diagnosis not present

## 2019-02-25 DIAGNOSIS — Y92512 Supermarket, store or market as the place of occurrence of the external cause: Secondary | ICD-10-CM | POA: Diagnosis not present

## 2019-02-25 DIAGNOSIS — S83271A Complex tear of lateral meniscus, current injury, right knee, initial encounter: Secondary | ICD-10-CM

## 2019-02-25 HISTORY — PX: KNEE ARTHROSCOPY WITH LATERAL MENISECTOMY: SHX6193

## 2019-02-25 SURGERY — ARTHROSCOPY, KNEE, WITH LATERAL MENISCECTOMY
Anesthesia: General | Site: Knee | Laterality: Right

## 2019-02-25 MED ORDER — FENTANYL CITRATE (PF) 100 MCG/2ML IJ SOLN
INTRAMUSCULAR | Status: DC | PRN
  Administered 2019-02-25 (×2): 50 ug via INTRAVENOUS
  Administered 2019-02-25 (×2): 25 ug via INTRAVENOUS

## 2019-02-25 MED ORDER — MEPERIDINE HCL 50 MG/ML IJ SOLN: 6.2500 mg | INTRAMUSCULAR | Status: DC | PRN

## 2019-02-25 MED ORDER — DEXAMETHASONE SODIUM PHOSPHATE 10 MG/ML IJ SOLN
INTRAMUSCULAR | Status: AC
  Filled 2019-02-25: qty 1

## 2019-02-25 MED ORDER — ONDANSETRON HCL 4 MG/2ML IJ SOLN
INTRAMUSCULAR | Status: DC | PRN
  Administered 2019-02-25: 4 mg via INTRAVENOUS

## 2019-02-25 MED ORDER — PROPOFOL 10 MG/ML IV BOLUS
INTRAVENOUS | Status: DC | PRN
  Administered 2019-02-25: 30 mg via INTRAVENOUS
  Administered 2019-02-25: 150 mg via INTRAVENOUS

## 2019-02-25 MED ORDER — METHYLPREDNISOLONE SODIUM SUCC 40 MG IJ SOLR
40.0000 mg | Freq: Once | INTRAMUSCULAR | Status: AC
  Administered 2019-02-25: 40 mg via INTRAVENOUS
  Filled 2019-02-25: qty 1

## 2019-02-25 MED ORDER — LACTATED RINGERS IV SOLN
Freq: Once | INTRAVENOUS | Status: AC
  Administered 2019-02-25: 10:00:00 via INTRAVENOUS

## 2019-02-25 MED ORDER — PROPOFOL 10 MG/ML IV BOLUS
INTRAVENOUS | Status: AC
  Filled 2019-02-25: qty 20

## 2019-02-25 MED ORDER — PHENYLEPHRINE 40 MCG/ML (10ML) SYRINGE FOR IV PUSH (FOR BLOOD PRESSURE SUPPORT)
PREFILLED_SYRINGE | INTRAVENOUS | Status: AC
  Filled 2019-02-25: qty 10

## 2019-02-25 MED ORDER — BUPIVACAINE-EPINEPHRINE (PF) 0.25% -1:200000 IJ SOLN
INTRAMUSCULAR | Status: DC | PRN
  Administered 2019-02-25: 60 mL via PERINEURAL

## 2019-02-25 MED ORDER — LACTATED RINGERS IV SOLN
INTRAVENOUS | Status: DC | PRN
  Administered 2019-02-25: 11:00:00 via INTRAVENOUS

## 2019-02-25 MED ORDER — 0.9 % SODIUM CHLORIDE (POUR BTL) OPTIME
TOPICAL | Status: DC | PRN
  Administered 2019-02-25: 1000 mL

## 2019-02-25 MED ORDER — SODIUM CHLORIDE (PF) 0.9 % IJ SOLN
INTRAMUSCULAR | Status: AC
  Filled 2019-02-25: qty 10

## 2019-02-25 MED ORDER — EPINEPHRINE PF 1 MG/ML IJ SOLN
INTRAMUSCULAR | Status: AC
  Filled 2019-02-25: qty 6

## 2019-02-25 MED ORDER — GLYCOPYRROLATE PF 0.2 MG/ML IJ SOSY
PREFILLED_SYRINGE | INTRAMUSCULAR | Status: DC | PRN
  Administered 2019-02-25: .2 mg via INTRAVENOUS

## 2019-02-25 MED ORDER — LIDOCAINE 2% (20 MG/ML) 5 ML SYRINGE
INTRAMUSCULAR | Status: AC
  Filled 2019-02-25: qty 5

## 2019-02-25 MED ORDER — OXYCODONE-ACETAMINOPHEN 5-325 MG PO TABS: 1.0000 | ORAL_TABLET | ORAL | 0 refills | Status: AC | PRN

## 2019-02-25 MED ORDER — FENTANYL CITRATE (PF) 250 MCG/5ML IJ SOLN
INTRAMUSCULAR | Status: AC
  Filled 2019-02-25: qty 5

## 2019-02-25 MED ORDER — CEFAZOLIN SODIUM-DEXTROSE 2-4 GM/100ML-% IV SOLN
2.0000 g | INTRAVENOUS | Status: AC
  Administered 2019-02-25: 2 g via INTRAVENOUS
  Filled 2019-02-25: qty 100

## 2019-02-25 MED ORDER — GLYCOPYRROLATE PF 0.2 MG/ML IJ SOSY
PREFILLED_SYRINGE | INTRAMUSCULAR | Status: AC
  Filled 2019-02-25: qty 1

## 2019-02-25 MED ORDER — BUPIVACAINE-EPINEPHRINE (PF) 0.25% -1:200000 IJ SOLN
INTRAMUSCULAR | Status: AC
  Filled 2019-02-25: qty 60

## 2019-02-25 MED ORDER — OXYCODONE HCL 5 MG PO TABS
5.0000 mg | ORAL_TABLET | Freq: Once | ORAL | Status: AC
  Administered 2019-02-25: 5 mg via ORAL
  Filled 2019-02-25: qty 1

## 2019-02-25 MED ORDER — SODIUM CHLORIDE 0.9 % IR SOLN
Status: DC | PRN
  Administered 2019-02-25 (×4): 3000 mL

## 2019-02-25 MED ORDER — ONDANSETRON HCL 4 MG/2ML IJ SOLN: 4.0000 mg | Freq: Once | INTRAMUSCULAR | Status: DC | PRN

## 2019-02-25 MED ORDER — ONDANSETRON HCL 4 MG/2ML IJ SOLN
INTRAMUSCULAR | Status: AC
  Filled 2019-02-25: qty 2

## 2019-02-25 MED ORDER — HYDRALAZINE HCL 20 MG/ML IJ SOLN
INTRAMUSCULAR | Status: AC
  Filled 2019-02-25: qty 1

## 2019-02-25 MED ORDER — LIDOCAINE HCL (CARDIAC) PF 100 MG/5ML IV SOSY
PREFILLED_SYRINGE | INTRAVENOUS | Status: DC | PRN
  Administered 2019-02-25: 40 mg via INTRAVENOUS

## 2019-02-25 MED ORDER — CHLORHEXIDINE GLUCONATE 4 % EX LIQD: 60.0000 mL | Freq: Once | CUTANEOUS | Status: DC

## 2019-02-25 MED ORDER — DEXAMETHASONE SODIUM PHOSPHATE 4 MG/ML IJ SOLN
INTRAMUSCULAR | Status: DC | PRN
  Administered 2019-02-25: 8 mg via INTRAVENOUS

## 2019-02-25 MED ORDER — HYDROMORPHONE HCL 1 MG/ML IJ SOLN
0.2500 mg | INTRAMUSCULAR | Status: DC | PRN
  Administered 2019-02-25 (×2): 0.5 mg via INTRAVENOUS
  Filled 2019-02-25 (×2): qty 0.5

## 2019-02-25 MED ORDER — ONDANSETRON HCL 4 MG/2ML IJ SOLN
4.0000 mg | Freq: Once | INTRAMUSCULAR | Status: AC
  Administered 2019-02-25: 4 mg via INTRAVENOUS
  Filled 2019-02-25: qty 2

## 2019-02-25 SURGICAL SUPPLY — 54 items
APL PRP STRL LF DISP 70% ISPRP (MISCELLANEOUS) ×2
ARTHROWAND PARAGON T2 (SURGICAL WAND)
BANDAGE ELASTIC 6 VELCRO NS (GAUZE/BANDAGES/DRESSINGS) ×1 IMPLANT
BIT DRILL 2.0MX128MM (BIT) IMPLANT
BLADE 11 SAFETY STRL DISP (BLADE) ×2 IMPLANT
BLADE AGGRESSIVE PLUS 4.0 (BLADE) ×2 IMPLANT
BNDG CMPR STD VLCR NS LF 5.8X6 (GAUZE/BANDAGES/DRESSINGS) ×1
BNDG ELASTIC 6X5.8 VLCR NS LF (GAUZE/BANDAGES/DRESSINGS) ×2 IMPLANT
CHLORAPREP W/TINT 26 (MISCELLANEOUS) ×4 IMPLANT
CLOTH BEACON ORANGE TIMEOUT ST (SAFETY) ×2 IMPLANT
COOLER CRYO IC GRAV AND TUBE (ORTHOPEDIC SUPPLIES) ×2 IMPLANT
CUFF CRYO KNEE18X23 MED (MISCELLANEOUS) ×1 IMPLANT
CUFF TOURN SGL QUICK 34 (TOURNIQUET CUFF) ×2
CUFF TRNQT CYL 34X4.125X (TOURNIQUET CUFF) IMPLANT
CUTTER ANGLED AGGR PLUS 4.0 (BURR) ×1 IMPLANT
DECANTER SPIKE VIAL GLASS SM (MISCELLANEOUS) ×4 IMPLANT
GAUZE 4X4 16PLY RFD (DISPOSABLE) ×2 IMPLANT
GAUZE SPONGE 4X4 12PLY STRL (GAUZE/BANDAGES/DRESSINGS) ×2 IMPLANT
GAUZE XEROFORM 5X9 LF (GAUZE/BANDAGES/DRESSINGS) ×2 IMPLANT
GLOVE BIOGEL PI IND STRL 7.0 (GLOVE) ×1 IMPLANT
GLOVE BIOGEL PI INDICATOR 7.0 (GLOVE) ×2
GLOVE SKINSENSE NS SZ8.0 LF (GLOVE) ×1
GLOVE SKINSENSE STRL SZ8.0 LF (GLOVE) ×1 IMPLANT
GLOVE SS N UNI LF 8.5 STRL (GLOVE) ×2 IMPLANT
GOWN STRL REUS W/TWL LRG LVL3 (GOWN DISPOSABLE) ×2 IMPLANT
GOWN STRL REUS W/TWL XL LVL3 (GOWN DISPOSABLE) ×2 IMPLANT
HLDR LEG FOAM (MISCELLANEOUS) ×1 IMPLANT
IV NS IRRIG 3000ML ARTHROMATIC (IV SOLUTION) ×6 IMPLANT
KIT BLADEGUARD II DBL (SET/KITS/TRAYS/PACK) ×2 IMPLANT
KIT TURNOVER CYSTO (KITS) ×2 IMPLANT
LEG HOLDER FOAM (MISCELLANEOUS) ×1
MANIFOLD NEPTUNE II (INSTRUMENTS) ×2 IMPLANT
MARKER SKIN DUAL TIP RULER LAB (MISCELLANEOUS) ×2 IMPLANT
NDL HYPO 18GX1.5 BLUNT FILL (NEEDLE) ×1 IMPLANT
NDL HYPO 21X1.5 SAFETY (NEEDLE) ×1 IMPLANT
NDL SPNL 18GX3.5 QUINCKE PK (NEEDLE) ×1 IMPLANT
NEEDLE HYPO 18GX1.5 BLUNT FILL (NEEDLE) ×2 IMPLANT
NEEDLE HYPO 21X1.5 SAFETY (NEEDLE) ×2 IMPLANT
NEEDLE SPNL 18GX3.5 QUINCKE PK (NEEDLE) ×2 IMPLANT
NS IRRIG 1000ML POUR BTL (IV SOLUTION) ×2 IMPLANT
PACK ARTHRO LIMB DRAPE STRL (MISCELLANEOUS) ×2 IMPLANT
PAD ABD 5X9 TENDERSORB (GAUZE/BANDAGES/DRESSINGS) ×2 IMPLANT
PAD ARMBOARD 7.5X6 YLW CONV (MISCELLANEOUS) ×2 IMPLANT
PADDING CAST COTTON 6X4 STRL (CAST SUPPLIES) ×2 IMPLANT
PADDING WEBRIL 6 STERILE (GAUZE/BANDAGES/DRESSINGS) ×1 IMPLANT
PROBE BIPOLAR 50 DEGREE SUCT (MISCELLANEOUS) ×1 IMPLANT
PROBE BIPOLAR ATHRO 135MM 90D (MISCELLANEOUS) IMPLANT
SET ARTHROSCOPY INST (INSTRUMENTS) ×2 IMPLANT
SET BASIN LINEN APH (SET/KITS/TRAYS/PACK) ×2 IMPLANT
SUT ETHILON 3 0 FSL (SUTURE) ×2 IMPLANT
SYR 30ML LL (SYRINGE) ×2 IMPLANT
TUBE CONNECTING 12X1/4 (SUCTIONS) ×4 IMPLANT
TUBING ARTHRO INFLOW-ONLY STRL (TUBING) ×2 IMPLANT
WAND ARTHRO PARAGON T2 (SURGICAL WAND) IMPLANT

## 2019-02-25 NOTE — Anesthesia Postprocedure Evaluation (Signed)
Anesthesia Post Note  Patient: Kathryn Woods  Procedure(s) Performed: RIGHT KNEE ARTHROSCOPY WITH LATERAL MENISECTOMY (Right Knee)  Patient location during evaluation: PACU Anesthesia Type: General Level of consciousness: awake and alert and oriented Pain management: pain level controlled Vital Signs Assessment: post-procedure vital signs reviewed and stable Respiratory status: spontaneous breathing Cardiovascular status: stable Postop Assessment: no apparent nausea or vomiting Anesthetic complications: no     Last Vitals:  Vitals:   02/25/19 1215 02/25/19 1230  BP: (!) 158/105 (!) 157/106  Pulse: 78 76  Resp: 15 11  Temp:    SpO2: 93% 99%    Last Pain:  Vitals:   02/25/19 1225  TempSrc:   PainSc: 10-Worst pain ever                 ADAMS, AMY A

## 2019-02-25 NOTE — Anesthesia Procedure Notes (Signed)
Procedure Name: LMA Insertion Date/Time: 02/25/2019 10:58 AM Performed by: Adams, Amy A, CRNA Pre-anesthesia Checklist: Patient identified, Emergency Drugs available, Suction available, Patient being monitored and Timeout performed Patient Re-evaluated:Patient Re-evaluated prior to induction Oxygen Delivery Method: Circle system utilized Preoxygenation: Pre-oxygenation with 100% oxygen Induction Type: IV induction LMA: LMA inserted LMA Size: 4.0 Number of attempts: 1 Placement Confirmation: positive ETCO2 and breath sounds checked- equal and bilateral Tube secured with: Tape

## 2019-02-25 NOTE — Transfer of Care (Signed)
Immediate Anesthesia Transfer of Care Note  Patient: Kathryn Woods  Procedure(s) Performed: RIGHT KNEE ARTHROSCOPY WITH LATERAL MENISECTOMY (Right Knee)  Patient Location: PACU  Anesthesia Type:General  Level of Consciousness: oriented, drowsy and patient cooperative  Airway & Oxygen Therapy: Patient Spontanous Breathing  Post-op Assessment: Report given to RN and Post -op Vital signs reviewed and stable  Post vital signs: Reviewed and stable  Last Vitals:  Vitals Value Taken Time  BP 148/96 02/25/19 1154  Temp    Pulse 74 02/25/19 1157  Resp 15 02/25/19 1157  SpO2 93 % 02/25/19 1157  Vitals shown include unvalidated device data.  Last Pain:  Vitals:   02/25/19 0941  TempSrc: Oral  PainSc: 5       Patients Stated Pain Goal: 5 (95/62/13 0865)  Complications: No apparent anesthesia complications

## 2019-02-25 NOTE — Interval H&P Note (Signed)
History and Physical Interval Note:  02/25/2019 10:46 AM  Kathryn Woods  has presented today for surgery, with the diagnosis of torn lateral meniscus right knee.  The various methods of treatment have been discussed with the patient and family. After consideration of risks, benefits and other options for treatment, the patient has consented to  Procedure(s): RIGHT KNEE ARTHROSCOPY WITH LATERAL MENISECTOMY (Right) as a surgical intervention.  The patient's history has been reviewed, patient examined, no change in status, stable for surgery.  I have reviewed the patient's chart and labs.  Questions were answered to the patient's satisfaction.     Arther Abbott

## 2019-02-25 NOTE — Anesthesia Preprocedure Evaluation (Addendum)
Anesthesia Evaluation  Patient identified by MRN, date of birth, ID band Patient awake    Reviewed: Allergy & Precautions, NPO status , Patient's Chart, lab work & pertinent test results  Airway Mallampati: II  TM Distance: >3 FB Neck ROM: Full    Dental  (+) Lower Dentures, Upper Dentures   Pulmonary former smoker,    Pulmonary exam normal breath sounds clear to auscultation       Cardiovascular Exercise Tolerance: Good Normal cardiovascular exam Rhythm:Regular Rate:Normal     Neuro/Psych  Headaches,    GI/Hepatic negative GI ROS, Neg liver ROS,   Endo/Other  negative endocrine ROS  Renal/GU negative Renal ROS     Musculoskeletal  (+) Arthritis  (back pain), Osteoarthritis,    Abdominal   Peds  Hematology negative hematology ROS (+)   Anesthesia Other Findings   Reproductive/Obstetrics                            Anesthesia Physical Anesthesia Plan  ASA: II  Anesthesia Plan: General   Post-op Pain Management:    Induction:   PONV Risk Score and Plan: 4 or greater and Dexamethasone and Ondansetron  Airway Management Planned: LMA  Additional Equipment:   Intra-op Plan:   Post-operative Plan:   Informed Consent: I have reviewed the patients History and Physical, chart, labs and discussed the procedure including the risks, benefits and alternatives for the proposed anesthesia with the patient or authorized representative who has indicated his/her understanding and acceptance.     Dental advisory given  Plan Discussed with: CRNA  Anesthesia Plan Comments:         Anesthesia Quick Evaluation

## 2019-02-25 NOTE — Op Note (Signed)
02/25/2019  11:45 AM  PATIENT:  Kathryn Woods  68 y.o. female  PRE-OPERATIVE DIAGNOSIS:  torn lateral meniscus right knee  POST-OPERATIVE DIAGNOSIS:  torn lateral meniscus, osteoarthritis  right knee  PROCEDURE:  Procedure(s): RIGHT KNEE ARTHROSCOPY WITH LATERAL MENISECTOMY (Right)   SURGEON:  Surgeon(s) and Role:    Carole Civil, MD - Primary   Knee arthroscopy dictation  The patient was identified in the preoperative holding area using 2 approved identification mechanisms. The chart was reviewed and updated. The surgical site was confirmed as right knee and marked with an indelible marker.  The patient was taken to the operating room for anesthesia. After successful general anesthesia, Ancef was used as IV antibiotics.  The patient was placed in the supine position with the (right) the operative extremity in an arthroscopic leg holder and the opposite extremity in a padded leg holder.  The timeout was executed.  A lateral portal was established with an 11 blade and the scope was introduced into the joint. A diagnostic arthroscopy was performed in circumferential manner examining the entire knee joint. A medial portal was established and the diagnostic arthroscopy was repeated using a probe to palpate intra-articular structures as they were encountered.   The operative findings are   Medial normal medial compartment Lateral grade 4 chondral changes on the tibial plateau and femur with a tear of the lateral meniscus at the popliteus hiatus. Patellofemoral grade II chondromalacia Notch normal ACL PCL   I resected the lateral meniscus in front of the popliteal tendon however the remaining remnant posterior to it was not stable and had to be resected.  We did leave the root of the meniscus intact after contouring and shaving of the posterior horn  We did a chondroplasty on the tibial plateau  We did a chondroplasty of the patella   The arthroscopic pump  was placed on the wash mode and any excess debris was removed from the joint using suction.  60 cc of Marcaine with epinephrine was injected through the arthroscope.  The portals were closed with 3-0 nylon suture.  A sterile bandage, Ace wrap and Cryo/Cuff was placed and the Cryo/Cuff was activated. The patient was taken to the recovery room in stable condition.  PHYSICIAN ASSISTANT:   ASSISTANTS: none   ANESTHESIA:   general  EBL:  5 mL   BLOOD ADMINISTERED:none  DRAINS: none   LOCAL MEDICATIONS USED:  MARCAINE     SPECIMEN:  No Specimen  DISPOSITION OF SPECIMEN:  N/A  COUNTS:  YES  TOURNIQUET:  * Missing tourniquet times found for documented tourniquets in log: [id number] *  DICTATION: .Dragon Dictation  PLAN OF CARE: Discharge to home after PACU  PATIENT DISPOSITION:  PACU - hemodynamically stable.   Delay start of Pharmacological VTE agent (>24hrs) due to surgical blood loss or risk of bleeding: not applicable

## 2019-02-25 NOTE — Discharge Instructions (Signed)

## 2019-02-25 NOTE — Brief Op Note (Signed)
02/25/2019  11:45 AM  PATIENT:  Kathryn Woods  68 y.o. female  PRE-OPERATIVE DIAGNOSIS:  torn lateral meniscus right knee  POST-OPERATIVE DIAGNOSIS:  torn lateral meniscus, osteoarthritis  right knee  PROCEDURE:  Procedure(s): RIGHT KNEE ARTHROSCOPY WITH LATERAL MENISECTOMY (Right)   SURGEON:  Surgeon(s) and Role:    Carole Civil, MD - Primary  PHYSICIAN ASSISTANT:   ASSISTANTS: none   ANESTHESIA:   general  EBL:  5 mL   BLOOD ADMINISTERED:none  DRAINS: none   LOCAL MEDICATIONS USED:  MARCAINE     SPECIMEN:  No Specimen  DISPOSITION OF SPECIMEN:  N/A  COUNTS:  YES  TOURNIQUET:  * Missing tourniquet times found for documented tourniquets in log: [id number] *  DICTATION: .Dragon Dictation  PLAN OF CARE: Discharge to home after PACU  PATIENT DISPOSITION:  PACU - hemodynamically stable.   Delay start of Pharmacological VTE agent (>24hrs) due to surgical blood loss or risk of bleeding: not applicable

## 2019-02-27 ENCOUNTER — Other Ambulatory Visit: Payer: Self-pay | Admitting: Orthopedic Surgery

## 2019-02-27 DIAGNOSIS — G8918 Other acute postprocedural pain: Secondary | ICD-10-CM

## 2019-02-27 MED ORDER — HYDROCODONE-ACETAMINOPHEN 10-325 MG PO TABS: 1.0000 | ORAL_TABLET | ORAL | 0 refills | Status: DC | PRN

## 2019-02-27 MED ORDER — IBUPROFEN 800 MG PO TABS: 800.0000 mg | ORAL_TABLET | Freq: Three times a day (TID) | ORAL | 0 refills | Status: DC | PRN

## 2019-02-27 NOTE — Progress Notes (Signed)
Pain not controlled with percocoet wants to try norco which was successful in the past

## 2019-03-01 ENCOUNTER — Other Ambulatory Visit: Payer: Self-pay | Admitting: Orthopedic Surgery

## 2019-03-01 ENCOUNTER — Encounter (HOSPITAL_COMMUNITY): Payer: Self-pay | Admitting: Orthopedic Surgery

## 2019-03-01 DIAGNOSIS — G8918 Other acute postprocedural pain: Secondary | ICD-10-CM

## 2019-03-01 MED ORDER — HYDROCODONE-ACETAMINOPHEN 10-325 MG PO TABS: 1.0000 | ORAL_TABLET | ORAL | 0 refills | Status: AC | PRN

## 2019-03-02 ENCOUNTER — Ambulatory Visit: Payer: Medicare Other | Admitting: Orthopedic Surgery

## 2019-03-02 DIAGNOSIS — Z9889 Other specified postprocedural states: Secondary | ICD-10-CM | POA: Insufficient documentation

## 2019-03-03 ENCOUNTER — Ambulatory Visit (INDEPENDENT_AMBULATORY_CARE_PROVIDER_SITE_OTHER): Payer: Medicare Other | Admitting: Orthopedic Surgery

## 2019-03-03 ENCOUNTER — Encounter: Payer: Self-pay | Admitting: Orthopedic Surgery

## 2019-03-03 ENCOUNTER — Other Ambulatory Visit: Payer: Self-pay

## 2019-03-03 DIAGNOSIS — Z9889 Other specified postprocedural states: Secondary | ICD-10-CM

## 2019-03-03 NOTE — Progress Notes (Signed)
Postop visit #1 this is postop day 6  The patient had a torn lateral meniscus and osteoarthritis of the right knee  The medial compartment was normal she had grade 4 chondral changes on the tibial plateau laterally and femur with a torn lateral meniscus which was acute noted at the popliteus hiatus with grade II chondromalacia of the patella the ACL and PCL were normal  She had a lateral meniscectomy  She called over the weekend Percocet was not controlling pain we switched over to hydrocodone 10 mg pain much better controlled  She has swelling of the knee today she is bending it about 75 degrees the portal sites were clean and the sutures were removed  Start home exercise program continue hydrocodone 10 mg decreased to 7.5 mg after her next prescription  Follow-up 3 weeks  Encounter Diagnosis  Name Primary?  . S/P right knee arthroscopy 02/25/2019 Yes

## 2019-03-22 DIAGNOSIS — M5126 Other intervertebral disc displacement, lumbar region: Secondary | ICD-10-CM | POA: Diagnosis not present

## 2019-03-22 DIAGNOSIS — M545 Low back pain: Secondary | ICD-10-CM | POA: Diagnosis not present

## 2019-03-22 DIAGNOSIS — M48062 Spinal stenosis, lumbar region with neurogenic claudication: Secondary | ICD-10-CM | POA: Diagnosis not present

## 2019-03-22 DIAGNOSIS — M5416 Radiculopathy, lumbar region: Secondary | ICD-10-CM | POA: Diagnosis not present

## 2019-03-22 DIAGNOSIS — M5136 Other intervertebral disc degeneration, lumbar region: Secondary | ICD-10-CM | POA: Diagnosis not present

## 2019-03-24 ENCOUNTER — Encounter: Payer: Self-pay | Admitting: Orthopedic Surgery

## 2019-03-24 ENCOUNTER — Other Ambulatory Visit: Payer: Self-pay

## 2019-03-24 ENCOUNTER — Ambulatory Visit (INDEPENDENT_AMBULATORY_CARE_PROVIDER_SITE_OTHER): Payer: Medicare Other | Admitting: Orthopedic Surgery

## 2019-03-24 DIAGNOSIS — Z9889 Other specified postprocedural states: Secondary | ICD-10-CM

## 2019-03-24 MED ORDER — HYDROCODONE-ACETAMINOPHEN 5-325 MG PO TABS: 1.0000 | ORAL_TABLET | Freq: Four times a day (QID) | ORAL | 0 refills | Status: AC | PRN

## 2019-03-24 MED ORDER — MELOXICAM 7.5 MG PO TABS: 7.5000 mg | ORAL_TABLET | Freq: Every day | ORAL | 5 refills | Status: DC

## 2019-03-24 NOTE — Patient Instructions (Signed)
Ok to do injections Lumbar  Continue exercises  Start meloxicam for arthritis

## 2019-03-24 NOTE — Progress Notes (Signed)
Chief Complaint  Patient presents with  . Routine Post Op    02/25/2019 right knee scope has some pain increase this week    Postop visit #2 we are now 27 days postop status post lateral meniscectomy The medial compartment was normal she had grade 4 chondral changes on the tibial plateau laterally and femur with a torn lateral meniscus which was acute noted at the popliteus hiatus with grade II chondromalacia of the patella the ACL and PCL were normal   Here for routine 3-week follow-up, on home exercise program  She had some increased pain in the knee this week and says it still very sore  She is scheduled to get epidural injections for chronic back pain which she takes hydrocodone for.  Ibuprofen does not seem to be helping the knee or the back  Examination reveals swelling around the knee joint and tenderness of the synovium in the knee joint as well but her range of motion and strength have improved significantly and should respond to further home exercises  Refills were given on the hydrocodone with a opioid warning and we started meloxicam once a day follow-up in 3 months  Meds ordered this encounter  Medications  . meloxicam (MOBIC) 7.5 MG tablet    Sig: Take 1 tablet (7.5 mg total) by mouth daily.    Dispense:  30 tablet    Refill:  5  . HYDROcodone-acetaminophen (NORCO/VICODIN) 5-325 MG tablet    Sig: Take 1 tablet by mouth every 6 (six) hours as needed for up to 7 days for moderate pain.    Dispense:  28 tablet    Refill:  0

## 2019-03-31 DIAGNOSIS — M5136 Other intervertebral disc degeneration, lumbar region: Secondary | ICD-10-CM | POA: Diagnosis not present

## 2019-03-31 DIAGNOSIS — M4726 Other spondylosis with radiculopathy, lumbar region: Secondary | ICD-10-CM | POA: Diagnosis not present

## 2019-04-21 ENCOUNTER — Other Ambulatory Visit: Payer: Self-pay

## 2019-04-21 MED ORDER — HYDROCODONE-ACETAMINOPHEN 5-325 MG PO TABS: 1.0000 | ORAL_TABLET | Freq: Two times a day (BID) | ORAL | 0 refills | Status: AC | PRN

## 2019-04-21 NOTE — Telephone Encounter (Signed)
Hydrocodone-Acetaminophen 5/325mg  Qty 28 Tablets  Take 1 tablet by mouth every 6(six) hours as needed for up to 7 days for moderate pain.  PATIENT USES [facility]  Patient states she is still hurting right much and she is still doing the exercises. Wants to know if she should be still hurting like she is.

## 2019-04-21 NOTE — Telephone Encounter (Signed)
If she is not getting better, or knee is worsening we should see her back in clinic  Message sent to Dr Romeo Apple for med refill

## 2019-04-23 ENCOUNTER — Ambulatory Visit: Payer: Medicare Other | Attending: Internal Medicine

## 2019-04-23 ENCOUNTER — Other Ambulatory Visit: Payer: Self-pay

## 2019-04-23 DIAGNOSIS — Z20822 Contact with and (suspected) exposure to covid-19: Secondary | ICD-10-CM

## 2019-04-24 LAB — NOVEL CORONAVIRUS, NAA: SARS-CoV-2, NAA: NOT DETECTED

## 2019-04-26 ENCOUNTER — Telehealth: Payer: Self-pay

## 2019-04-26 NOTE — Telephone Encounter (Signed)
Pt notified of negative COVID-19 results. Understanding verbalized.  Chasta M Hopkins   

## 2019-05-14 DIAGNOSIS — E7849 Other hyperlipidemia: Secondary | ICD-10-CM | POA: Diagnosis not present

## 2019-05-14 DIAGNOSIS — E559 Vitamin D deficiency, unspecified: Secondary | ICD-10-CM | POA: Diagnosis not present

## 2019-05-26 DIAGNOSIS — M25512 Pain in left shoulder: Secondary | ICD-10-CM | POA: Diagnosis not present

## 2019-05-26 DIAGNOSIS — E559 Vitamin D deficiency, unspecified: Secondary | ICD-10-CM | POA: Diagnosis not present

## 2019-05-26 DIAGNOSIS — E785 Hyperlipidemia, unspecified: Secondary | ICD-10-CM | POA: Diagnosis not present

## 2019-06-03 DIAGNOSIS — R6889 Other general symptoms and signs: Secondary | ICD-10-CM | POA: Insufficient documentation

## 2019-06-03 DIAGNOSIS — R748 Abnormal levels of other serum enzymes: Secondary | ICD-10-CM | POA: Insufficient documentation

## 2019-06-23 ENCOUNTER — Other Ambulatory Visit: Payer: Self-pay

## 2019-06-23 ENCOUNTER — Ambulatory Visit: Payer: Medicare Other

## 2019-06-23 ENCOUNTER — Ambulatory Visit: Payer: Medicare Other | Admitting: Orthopedic Surgery

## 2019-06-23 VITALS — BP 136/86 | HR 80 | Temp 97.3°F | Ht 65.0 in | Wt 174.0 lb

## 2019-06-23 DIAGNOSIS — M25512 Pain in left shoulder: Secondary | ICD-10-CM

## 2019-06-23 DIAGNOSIS — G8929 Other chronic pain: Secondary | ICD-10-CM | POA: Diagnosis not present

## 2019-06-23 DIAGNOSIS — Z9889 Other specified postprocedural states: Secondary | ICD-10-CM

## 2019-06-23 MED ORDER — HYDROCODONE-ACETAMINOPHEN 5-325 MG PO TABS: 1.0000 | ORAL_TABLET | Freq: Three times a day (TID) | ORAL | 0 refills | Status: AC | PRN

## 2019-06-23 NOTE — Patient Instructions (Signed)
You have been scheduled for an MRI scan  We will call your insurance company to do a precertification to get the MRI covered  You will receive a phone call regarding the date of the scan  Dr Harrison will call you with the results   

## 2019-06-23 NOTE — Progress Notes (Signed)
Kathryn Woods [facility]  06/23/2019  Body mass index is 28.96 kg/m.   HISTORY SECTION :  Chief Complaint  Patient presents with  . Knee Pain    Recheck on righ tknee, DOS 02-25-19.  Marland Kitchen Shoulder Pain    left   HPI The patient presents for evaluation of  (mild/moderate/severe/ ) severe pain in the left shoulder for 3 to 4 months associated with decreased range of motion and weakness no prior treatment   Review of Systems  Constitutional: Negative for fever.  Respiratory: Negative for shortness of breath.   Cardiovascular: Negative for chest pain.  Neurological: Negative for tingling, sensory change and focal weakness.     has a past medical history of Arthritis.   Past Surgical History:  Procedure Laterality Date  . BACK SURGERY    . KNEE ARTHROSCOPY WITH LATERAL MENISECTOMY Right 02/25/2019   Procedure: RIGHT KNEE ARTHROSCOPY WITH LATERAL MENISECTOMY;  Surgeon: Vickki Hearing, MD;  Location: [facility];  Service: Orthopedics;  Laterality: Right;  . SHOULDER ARTHROSCOPY WITH OPEN ROTATOR CUFF REPAIR Right 10/15/2018   Procedure: SHOULDER ARTHROSCOPY WITH OPEN ROTATOR CUFF REPAIR;  Surgeon: Vickki Hearing, MD;  Location: [facility];  Service: Orthopedics;  Laterality: Right;  . TUBAL LIGATION      Body mass index is 28.96 kg/m.   Allergies  Allergen Reactions  . Aspirin Other (See Comments)    "Messes stomach up real bad"     Current Outpatient Medications:  .  ibuprofen (ADVIL) 800 MG tablet, Take 1 tablet (800 mg total) by mouth every 8 (eight) hours as needed., Disp: 30 tablet, Rfl: 0 .  meloxicam (MOBIC) 7.5 MG tablet, Take 1 tablet (7.5 mg total) by mouth daily., Disp: 30 tablet, Rfl: 5 .  HYDROcodone-acetaminophen (NORCO/VICODIN) 5-325 MG tablet, Take 1 tablet by mouth every 8 (eight) hours as needed for up to 5 days for moderate pain., Disp: 15 tablet, Rfl: 0   PHYSICAL EXAM SECTION: 1) BP 136/86   Pulse 80   Temp (!) 97.3 F (36.3 C)   Ht 5\' 5"  (1.651  m)   Wt 174 lb (78.9 kg)   BMI 28.96 kg/m   Body mass index is 28.96 kg/m. General appearance: Well-developed well-nourished no gross deformities  2) Cardiovascular normal pulse and perfusion , normal color   3) Neurologically deep tendon reflexes are equal and normal, no sensation loss or deficits no pathologic reflexes  4) Psychological: Awake alert and oriented x3 mood and affect normal  5) Skin no lacerations or ulcerations no nodularity no palpable masses, no erythema or nodularity  6) Musculoskeletal:   Update right knee full range of motion patient having no pain no swelling feels stable  Left shoulder tenderness posterior acromion anterior deltoid anterior rotator interval with normal external rotation at the arm when it is at her side but poor abduction poor flexion only 120 degrees, less than 90 degrees abduction weakness in the supraspinatus tendon with normal cuff strength everywhere else   MEDICAL DECISION MAKING  A.  Encounter Diagnoses  Name Primary?  . S/P right knee arthroscopy 02/25/2019   . Chronic left shoulder pain Yes    B. DATA ANALYSED:  IMAGING: Independent interpretation of images: Office images were obtained mild arthritis of the shoulder at the glenohumeral joint AC joint with greater tuberosity sclerosis  Orders: Order MRI  Outside records reviewed: None  C. MANAGEMENT  Order MRI left shoulder  Meds ordered this encounter  Medications  . HYDROcodone-acetaminophen (NORCO/VICODIN) 5-325 MG  tablet    Sig: Take 1 tablet by mouth every 8 (eight) hours as needed for up to 5 days for moderate pain.    Dispense:  15 tablet    Refill:  0      Arther Abbott, MD  06/23/2019 11:28 AM

## 2019-06-23 NOTE — Addendum Note (Signed)
Addended byCaffie Damme on: 06/23/2019 11:31 AM   Modules accepted: Orders

## 2019-06-25 ENCOUNTER — Ambulatory Visit: Payer: Medicare Other | Attending: Internal Medicine

## 2019-06-25 DIAGNOSIS — Z23 Encounter for immunization: Secondary | ICD-10-CM

## 2019-06-25 NOTE — Progress Notes (Signed)
   Covid-19 Vaccination Clinic  Name:  Kathryn Woods    MRN: [id number] DOB: 1951/03/30  06/25/2019  Ms. Kathryn Woods was observed post Covid-19 immunization for 15 minutes without incident. She was provided with Vaccine Information Sheet and instruction to access the V-Safe system.   Ms. Kathryn Woods was instructed to call 911 with any severe reactions post vaccine: Marland Kitchen Difficulty breathing  . Swelling of face and throat  . A fast heartbeat  . A bad rash all over body  . Dizziness and weakness   Immunizations Administered    Name Date Dose VIS Date Route   Moderna COVID-19 Vaccine 06/25/2019  8:28 AM 0.5 mL 03/09/2019 Intramuscular   Manufacturer: Moderna   Lot: [id number]   NDC: 72257-505-18

## 2019-07-05 ENCOUNTER — Ambulatory Visit: Payer: Medicare Other | Admitting: Orthopedic Surgery

## 2019-07-20 ENCOUNTER — Other Ambulatory Visit: Payer: Self-pay | Admitting: Orthopedic Surgery

## 2019-07-20 ENCOUNTER — Telehealth: Payer: Self-pay | Admitting: Radiology

## 2019-07-20 MED ORDER — HYDROCODONE-ACETAMINOPHEN 5-325 MG PO TABS: 1.0000 | ORAL_TABLET | Freq: Four times a day (QID) | ORAL | 0 refills | Status: AC | PRN

## 2019-07-20 NOTE — Telephone Encounter (Signed)
Rx request  Patient called, is requesting something for pain for her arm.  Pending MRI on Thursday.

## 2019-07-22 ENCOUNTER — Other Ambulatory Visit: Payer: Self-pay

## 2019-07-22 ENCOUNTER — Ambulatory Visit (HOSPITAL_COMMUNITY)
Admission: RE | Admit: 2019-07-22 | Discharge: 2019-07-22 | Disposition: A | Discharge status: Home / Self Care | Payer: Medicare Other | Source: Ambulatory Visit | Attending: Orthopedic Surgery | Admitting: Orthopedic Surgery

## 2019-07-22 DIAGNOSIS — G8929 Other chronic pain: Secondary | ICD-10-CM | POA: Insufficient documentation

## 2019-07-22 DIAGNOSIS — M75102 Unspecified rotator cuff tear or rupture of left shoulder, not specified as traumatic: Secondary | ICD-10-CM | POA: Diagnosis not present

## 2019-07-22 DIAGNOSIS — M25512 Pain in left shoulder: Secondary | ICD-10-CM | POA: Insufficient documentation

## 2019-07-26 ENCOUNTER — Encounter: Payer: Self-pay | Admitting: Orthopedic Surgery

## 2019-07-26 ENCOUNTER — Ambulatory Visit (INDEPENDENT_AMBULATORY_CARE_PROVIDER_SITE_OTHER): Payer: Medicare Other | Admitting: Orthopedic Surgery

## 2019-07-26 ENCOUNTER — Other Ambulatory Visit: Payer: Self-pay

## 2019-07-26 VITALS — BP 141/90 | HR 78 | Ht 65.0 in | Wt 175.0 lb

## 2019-07-26 DIAGNOSIS — M75122 Complete rotator cuff tear or rupture of left shoulder, not specified as traumatic: Secondary | ICD-10-CM | POA: Diagnosis not present

## 2019-07-26 DIAGNOSIS — G8929 Other chronic pain: Secondary | ICD-10-CM

## 2019-07-26 DIAGNOSIS — M25512 Pain in left shoulder: Secondary | ICD-10-CM

## 2019-07-26 NOTE — Patient Instructions (Signed)
Surgery for Rotator Cuff Tear  Rotator cuff surgery is only recommended for individuals who have experienced persistent disability for greater than 3 months of non-surgical (conservative) treatment. Surgery is not necessary but is recommended for individuals who experience difficulty completing daily activities or athletes who are unable to compete. Rotator cuff tears do not usually heal without surgical intervention. If left alone small rotator cuff tears usually become larger. Younger athletes who have a rotator cuff tear may be recommended for surgery without attempting conservative rehabilitation. The purpose of surgery is to regain function of the shoulder joint and eliminate pain associated with the injury. In addition to repairing the tendon tear, the surgery may often remove a portion of the bony roof of the shoulder (acromion) as well as the chronically thickened and inflamed membrane below the acromion (subacromial bursa). REASONS NOT TO OPERATE  Infection of the shoulder. Inability to complete a rehabilitation program. Patients who have other conditions (emotional or psychological) conditions that contribute to their shoulder condition. RISKS AND COMPLICATIONS Infection. Re-tear of the rotator cuff tendons or muscles. Shoulder stiffness and/or weakness. Inability to compete in athletics. Acromioclavicular (AC) joint pain Risks of surgery: infection, bleeding, nerve damage, or damage to surrounding tissues. TECHNIQUE There are different surgical procedures used to treat rotator cuff tears. The type of procedure depends on the extent of injury as well as the surgeon's preference. All of the surgical techniques for rotator cuff tears have the same goal of repairing the torn tendon, removing part of the acromion, and removing the subacromial bursa. There are two main types of procedures: arthroscopic and open incision. Arthroscopic procedures are usually completed and you go home the same day  as surgery (outpatient). These procedures use multiple small incisions in which tools and a video camera are placed to work on the shoulder. An electric shaver removes the bursa, then a power burr shaves down the portion of the acromion that places pressure on the rotator cuff. Finally the rotator cuff is sewed (sutured) back to the humeral head. Open incision procedures require a larger incision. The deltoid muscle is detached from the acromion and a ligament in the shoulder (coracoacromial) is cut in order for the surgeon to access the rotator cuff. The subacromial bursa is removed as well as part of the acromion to give the rotator cuff room to move freely. The torn tendon is then sutured to the humeral head. After the rotator cuff is repaired, then the deltoid is reattached and the incision is closed up.  RECOVERY  Post-operative care depends on the surgical technique and the preferences of your therapist. Keep the wound clean and dry for the first 10 to 14 days after surgery. Keep your shoulder and arm in the sling provided to you for as long as you have been instructed to. You will be given pain medications by your caregiver. Passive (without using muscles) shoulder movements may be begun when instructed. It is important to follow through with you rehabilitation program in order to have the best possible recovery. RETURN TO SPORTS  The rehabilitation period will depend on the sport and position you play as well as the success of the operation. The minimum recovery period is 6 months. You must have regained complete shoulder motion and strength before returning to sports. SEEK IMMEDIATE MEDICAL CARE IF:  Any medications produce adverse side effects. Any complications from surgery occur: Pain, numbness, or coldness in the extremity operated upon. Discoloration of the nail beds (they become blue or gray) of   the extremity operated upon. Signs of infections (fever, pain, inflammation, redness, or  persistent bleeding).   You have decided to proceed with rotator cuff repair surgery. You have decided not to continue with nonoperative measures such as but not limited to oral medication,   activity modification, physical therapy, bracing, or injection.  We will perform rotator cuff repair. Some of the risks associated with rotator cuff repair include but are not limited to Bleeding Infection Swelling Stiffness Blood clot Pain Re-tearing of the rotator cuff Failure of the rotator cuff to heal   If you're not comfortable with these risks and would like to continue with nonoperative treatment please let Dr. Micayla Brathwaite know prior to your surgery.  

## 2019-07-26 NOTE — Addendum Note (Signed)
Addended byCaffie Damme on: 07/26/2019 11:47 AM   Modules accepted: Orders, SmartSet

## 2019-07-26 NOTE — Progress Notes (Signed)
Chief Complaint  Patient presents with  . Shoulder Pain    left   . Results    review MRI     Encounter Diagnoses  Name Primary?  . Chronic left shoulder pain Yes  . Nontraumatic complete tear of left rotator cuff     69 year old female with chronic pain left shoulder, had MRI.  MRI is available for my review with the report.  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  MRI report:  IMPRESSION: 1. Moderate rotator cuff tendinopathy/tendinosis with a small full-thickness retracted supraspinatus tendon tear. 2. Intact long head biceps tendon and grossly normal glenoid labrum. 3. Moderate AC joint degenerative changes but no other significant findings for bony impingement. 4. Mild glenohumeral joint degenerative changes with small joint effusion and synovitis versus adhesive capsulitis.   Electronically Signed   By: Rudie Meyer M.D.   On: 07/23/2019 11:14  [name] and I discussed the possible treatment options and she wants to have surgery soon as possible.  She will need an arthroscopy of the right shoulder to clean out the joint and then a rotator cuff repair with a suture anchor construct with lateral row  She understands she will have some arthritis pain from the glenohumeral arthritis  The acromioclavicular joint can be left alone  Arthroscopy of the left shoulder with open rotator cuff repair  The procedure has been fully reviewed with the patient; The risks and benefits of surgery have been discussed and explained and understood. Alternative treatment has also been reviewed, questions were encouraged and answered. The postoperative plan is also been reviewed.

## 2019-07-27 ENCOUNTER — Ambulatory Visit: Payer: Medicare Other | Attending: Internal Medicine

## 2019-07-27 DIAGNOSIS — Z23 Encounter for immunization: Secondary | ICD-10-CM

## 2019-07-27 NOTE — Progress Notes (Signed)
   Covid-19 Vaccination Clinic  Name:  Rosland Riding    MRN: [id number] DOB: 1951-02-22  07/27/2019  Ms. Padget was observed post Covid-19 immunization for 15 minutes without incident. She was provided with Vaccine Information Sheet and instruction to access the V-Safe system.   Ms. Velasquez was instructed to call 911 with any severe reactions post vaccine: Marland Kitchen Difficulty breathing  . Swelling of face and throat  . A fast heartbeat  . A bad rash all over body  . Dizziness and weakness   Immunizations Administered    Name Date Dose VIS Date Route   Moderna COVID-19 Vaccine 07/27/2019  8:04 AM 0.5 mL 03/2019 Intramuscular   Manufacturer: Moderna   Lot: [id number]   NDC: 86148-307-35

## 2019-07-29 ENCOUNTER — Other Ambulatory Visit: Payer: Self-pay | Admitting: Orthopedic Surgery

## 2019-07-29 MED ORDER — HYDROCODONE-ACETAMINOPHEN 5-325 MG PO TABS: 1.0000 | ORAL_TABLET | Freq: Four times a day (QID) | ORAL | 0 refills | Status: DC | PRN

## 2019-07-29 NOTE — Telephone Encounter (Signed)
Patient requests refill on Hydrocodone/Acetaminophen 5-325  Mgs.    She uses [facility] [place]

## 2019-08-05 ENCOUNTER — Other Ambulatory Visit: Payer: Self-pay | Admitting: Orthopaedic Surgery

## 2019-08-05 DIAGNOSIS — M75122 Complete rotator cuff tear or rupture of left shoulder, not specified as traumatic: Secondary | ICD-10-CM

## 2019-08-16 ENCOUNTER — Encounter (HOSPITAL_COMMUNITY): Payer: Self-pay

## 2019-08-16 NOTE — Patient Instructions (Signed)
Kathryn Woods  08/16/2019     @[email or web address] @   Your procedure is scheduled on Thursday, May 13.  Report to [birth year][facility]2000 at [phone] A.M.  Call this number if you have problems the morning of surgery:  [phone]   Remember:  Do not eat or drink after midnight.     Take these medicines the morning of surgery with A SIP OF WATER crestor and norco if needed    Do not wear jewelry, make-up or nail polish.  Do not wear lotions, powders, or perfumes, or deodorant.  Do not shave 48 hours prior to surgery.  Men may shave face and neck.  Do not bring valuables to the hospital.  [facility] is not responsible for any belongings or valuables.  Contacts, dentures or bridgework may not be worn into surgery.  Leave your suitcase in the car.  After surgery it may be brought to your room.  For patients admitted to the hospital, discharge time will be determined by your treatment team.  Patients discharged the day of surgery will not be allowed to drive home.   Name and phone number of your driver:   family Special instructions:  none  Please read over the following fact sheets that you were given. Pain Booklet, Surgical Site Infection Prevention, Anesthesia Post-op Instructions and Care and Recovery After Surgery      Surgery for Rotator Cuff Tear, Care After This sheet gives you information about how to care for yourself after your procedure. Your health care provider may also give you more specific instructions. If you have problems or questions, contact your health care provider. What can I expect after the procedure? After the procedure, it is common to have:  Swelling.  Pain.  Stiffness.  Tenderness. Follow these instructions at home: If you have a sling or a shoulder immobilizer:  Wear it as told by your health care provider. Remove it only as told by your health care provider.  Loosen it if your fingers tingle, become numb, or turn cold and blue.  Keep  it clean. Bathing  Do not take baths, swim, or use a hot tub until your health care provider approves. Ask your health care provider if you may take showers. You may only be allowed to take sponge baths.  Keep your bandage (dressing) dry until your health care provider says it can be removed.  If your sling or shoulder immobilizer is not waterproof: ? Do not let it get wet. ? Remove it when you take a bath or shower as told by your health care provider. Once the sling or shoulder immobilizer is removed, try not to move your shoulder until your health care provider says that you can. Incision care   Follow instructions from your health care provider about how to take care of your incision. Make sure you: ? Wash your hands with soap and water before and after you change your dressing. If soap and water are not available, use hand sanitizer. ? Change your dressing as told by your health care provider. ? Leave stitches (sutures), skin glue, or adhesive strips in place. These skin closures may need to stay in place for 2 weeks or longer. If adhesive strip edges start to loosen and curl up, you may trim the loose edges. Do not remove adhesive strips completely unless your health care provider tells you to do that.  Check your incision area every day for signs of infection. Check for: ? More redness, swelling, or  pain. ? More fluid or blood. ? Warmth. ? Pus or a bad smell. Managing pain, stiffness, and swelling   If directed, put ice on your shoulder area. ? Put ice in a plastic bag. ? Place a towel between your skin and the bag. ? Leave the ice on for 20 minutes, 2-3 times a day.  Move your fingers often to reduce stiffness and swelling.  Raise (elevate) your upper body on pillows when you lie down and when you sleep. ? Do not sleep on the front of your body (abdomen). ? Do not sleep on the side that your surgery was performed on. Medicines  Take over-the-counter and prescription  medicines only as told by your health care provider.  Ask your health care provider if the medicine prescribed to you: ? Requires you to avoid driving or using heavy machinery. ? Can cause constipation. You may need to take actions to prevent or treat constipation, such as:  Drink enough fluid to keep your urine pale yellow.  Take over-the-counter or prescription medicines.  Eat foods that are high in fiber, such as beans, whole grains, and fresh fruits and vegetables.  Limit foods that are high in fat and processed sugars, such as fried or sweet foods. Driving  Do not drive for 24 hours if you were given a sedative during your procedure.  Do not drive while wearing a sling or a shoulder immobilizer. Ask your health care provider when it is safe to drive. Activity  Do not use your arm to support your body weight until your health care provider says that you can.  Do not lift or hold anything with your arm until your health care provider approves.  Return to your normal activities as told by your health care provider. Ask your health care provider what activities are safe for you.  Do exercises as told by your health care provider. General instructions  Do not use any products that contain nicotine or tobacco, such as cigarettes, e-cigarettes, and chewing tobacco. These can delay healing after surgery. If you need help quitting, ask your health care provider.  Keep all follow-up visits as told by your health care provider. This is important. Contact a health care provider if:  You have a fever.  You have more redness, swelling, or pain around your incision.  You have more fluid or blood coming from your incision.  Your incision feels warm to the touch.  You have pus or a bad smell coming from your incision.  You have pain that gets worse or does not get better with medicine. Get help right away if:  You have severe pain.  You lose feeling in your arm or hand.  Your  hand or fingers turn very pale or blue. Summary  If you have a sling, wear it as told by your health care provider. Remove it only as told by your health care provider.  Change your dressing as told by your health care provider. Check the incision area every day for signs of infection.  If directed, put ice on your shoulder area 2-3 times a day.  Do not use your arm to lift anything or to support your body weight until your health care provider says that you can. This information is not intended to replace advice given to you by your health care provider. Make sure you discuss any questions you have with your health care provider. Document Revised: 12/29/2017 Document Reviewed: 12/31/2017 Elsevier Patient Education  2020 [employer]. How  to Use Chlorhexidine for Bathing Chlorhexidine gluconate (CHG) is a germ-killing (antiseptic) solution that is used to clean the skin. It can get rid of the bacteria that normally live on the skin and can keep them away for about 24 hours. To clean your skin with CHG, you may be given:  A CHG solution to use in the shower or as part of a sponge bath.  A prepackaged cloth that contains CHG. Cleaning your skin with CHG may help lower the risk for infection:  While you are staying in the intensive care unit of the hospital.  If you have a vascular access, such as a central line, to provide short-term or long-term access to your veins.  If you have a catheter to drain urine from your bladder.  If you are on a ventilator. A ventilator is a machine that helps you breathe by moving air in and out of your lungs.  After surgery. What are the risks? Risks of using CHG include:  A skin reaction.  Hearing loss, if CHG gets in your ears.  Eye injury, if CHG gets in your eyes and is not rinsed out.  The CHG product catching fire. Make sure that you avoid smoking and flames after applying CHG to your skin. Do not use CHG:  If you have a chlorhexidine  allergy or have previously reacted to chlorhexidine.  On babies younger than 40 months of age. How to use CHG solution  Use CHG only as told by your health care provider, and follow the instructions on the label.  Use the full amount of CHG as directed. Usually, this is one bottle. During a shower Follow these steps when using CHG solution during a shower (unless your health care provider gives you different instructions): 1. Start the shower. 2. Use your normal soap and shampoo to wash your face and hair. 3. Turn off the shower or move out of the shower stream. 4. Pour the CHG onto a clean washcloth. Do not use any type of brush or rough-edged sponge. 5. Starting at your neck, lather your body down to your toes. Make sure you follow these instructions: ? If you will be having surgery, pay special attention to the part of your body where you will be having surgery. Scrub this area for at least 1 minute. ? Do not use CHG on your head or face. If the solution gets into your ears or eyes, rinse them well with water. ? Avoid your genital area. ? Avoid any areas of skin that have broken skin, cuts, or scrapes. ? Scrub your back and under your arms. Make sure to wash skin folds. 6. Let the lather sit on your skin for 1-2 minutes or as long as told by your health care provider. 7. Thoroughly rinse your entire body in the shower. Make sure that all body creases and crevices are rinsed well. 8. Dry off with a clean towel. Do not put any substances on your body afterward--such as powder, lotion, or perfume--unless you are told to do so by your health care provider. Only use lotions that are recommended by the manufacturer. 9. Put on clean clothes or pajamas. 10. If it is the night before your surgery, sleep in clean sheets.  During a sponge bath Follow these steps when using CHG solution during a sponge bath (unless your health care provider gives you different instructions): 1. Use your normal soap  and shampoo to wash your face and hair. 2. Pour the CHG onto  a clean washcloth. 3. Starting at your neck, lather your body down to your toes. Make sure you follow these instructions: ? If you will be having surgery, pay special attention to the part of your body where you will be having surgery. Scrub this area for at least 1 minute. ? Do not use CHG on your head or face. If the solution gets into your ears or eyes, rinse them well with water. ? Avoid your genital area. ? Avoid any areas of skin that have broken skin, cuts, or scrapes. ? Scrub your back and under your arms. Make sure to wash skin folds. 4. Let the lather sit on your skin for 1-2 minutes or as long as told by your health care provider. 5. Using a different clean, wet washcloth, thoroughly rinse your entire body. Make sure that all body creases and crevices are rinsed well. 6. Dry off with a clean towel. Do not put any substances on your body afterward--such as powder, lotion, or perfume--unless you are told to do so by your health care provider. Only use lotions that are recommended by the manufacturer. 7. Put on clean clothes or pajamas. 8. If it is the night before your surgery, sleep in clean sheets. How to use CHG prepackaged cloths  Only use CHG cloths as told by your health care provider, and follow the instructions on the label.  Use the CHG cloth on clean, dry skin.  Do not use the CHG cloth on your head or face unless your health care provider tells you to.  When washing with the CHG cloth: ? Avoid your genital area. ? Avoid any areas of skin that have broken skin, cuts, or scrapes. Before surgery Follow these steps when using a CHG cloth to clean before surgery (unless your health care provider gives you different instructions): 1. Using the CHG cloth, vigorously scrub the part of your body where you will be having surgery. Scrub using a back-and-forth motion for 3 minutes. The area on your body should be  completely wet with CHG when you are done scrubbing. 2. Do not rinse. Discard the cloth and let the area air-dry. Do not put any substances on the area afterward, such as powder, lotion, or perfume. 3. Put on clean clothes or pajamas. 4. If it is the night before your surgery, sleep in clean sheets.  For general bathing Follow these steps when using CHG cloths for general bathing (unless your health care provider gives you different instructions). 1. Use a separate CHG cloth for each area of your body. Make sure you wash between any folds of skin and between your fingers and toes. Wash your body in the following order, switching to a new cloth after each step: ? The front of your neck, shoulders, and chest. ? Both of your arms, under your arms, and your hands. ? Your stomach and groin area, avoiding the genitals. ? Your right leg and foot. ? Your left leg and foot. ? The back of your neck, your back, and your buttocks. 2. Do not rinse. Discard the cloth and let the area air-dry. Do not put any substances on your body afterward--such as powder, lotion, or perfume--unless you are told to do so by your health care provider. Only use lotions that are recommended by the manufacturer. 3. Put on clean clothes or pajamas. Contact a health care provider if:  Your skin gets irritated after scrubbing.  You have questions about using your solution or cloth. Get help right  away if:  Your eyes become very red or swollen.  Your eyes itch badly.  Your skin itches badly and is red or swollen.  Your hearing changes.  You have trouble seeing.  You have swelling or tingling in your mouth or throat.  You have trouble breathing.  You swallow any chlorhexidine. Summary  Chlorhexidine gluconate (CHG) is a germ-killing (antiseptic) solution that is used to clean the skin. Cleaning your skin with CHG may help to lower your risk for infection.  You may be given CHG to use for bathing. It may be in a  bottle or in a prepackaged cloth to use on your skin. Carefully follow your health care provider's instructions and the instructions on the product label.  Do not use CHG if you have a chlorhexidine allergy.  Contact your health care provider if your skin gets irritated after scrubbing. This information is not intended to replace advice given to you by your health care provider. Make sure you discuss any questions you have with your health care provider. Document Revised: 06/11/2018 Document Reviewed: 02/20/2017 Elsevier Patient Education  2020 [employer] General Anesthesia, Adult, Care After This sheet gives you information about how to care for yourself after your procedure. Your health care provider may also give you more specific instructions. If you have problems or questions, contact your health care provider. What can I expect after the procedure? After the procedure, the following side effects are common:  Pain or discomfort at the IV site.  Nausea.  Vomiting.  Sore throat.  Trouble concentrating.  Feeling cold or chills.  Weak or tired.  Sleepiness and fatigue.  Soreness and body aches. These side effects can affect parts of the body that were not involved in surgery. Follow these instructions at home:  For at least 24 hours after the procedure:  Have a responsible adult stay with you. It is important to have someone help care for you until you are awake and alert.  Rest as needed.  Do not: ? Participate in activities in which you could fall or become injured. ? Drive. ? Use heavy machinery. ? Drink alcohol. ? Take sleeping pills or medicines that cause drowsiness. ? Make important decisions or sign legal documents. ? Take care of children on your own. Eating and drinking  Follow any instructions from your health care provider about eating or drinking restrictions.  When you feel hungry, start by eating small amounts of foods that are soft and easy to  digest (bland), such as toast. Gradually return to your regular diet.  Drink enough fluid to keep your urine pale yellow.  If you vomit, rehydrate by drinking water, juice, or clear broth. General instructions  If you have sleep apnea, surgery and certain medicines can increase your risk for breathing problems. Follow instructions from your health care provider about wearing your sleep device: ? Anytime you are sleeping, including during daytime naps. ? While taking prescription pain medicines, sleeping medicines, or medicines that make you drowsy.  Return to your normal activities as told by your health care provider. Ask your health care provider what activities are safe for you.  Take over-the-counter and prescription medicines only as told by your health care provider.  If you smoke, do not smoke without supervision.  Keep all follow-up visits as told by your health care provider. This is important. Contact a health care provider if:  You have nausea or vomiting that does not get better with medicine.  You cannot eat or  drink without vomiting.  You have pain that does not get better with medicine.  You are unable to pass urine.  You develop a skin rash.  You have a fever.  You have redness around your IV site that gets worse. Get help right away if:  You have difficulty breathing.  You have chest pain.  You have blood in your urine or stool, or you vomit blood. Summary  After the procedure, it is common to have a sore throat or nausea. It is also common to feel tired.  Have a responsible adult stay with you for the first 24 hours after general anesthesia. It is important to have someone help care for you until you are awake and alert.  When you feel hungry, start by eating small amounts of foods that are soft and easy to digest (bland), such as toast. Gradually return to your regular diet.  Drink enough fluid to keep your urine pale yellow.  Return to your normal  activities as told by your health care provider. Ask your health care provider what activities are safe for you. This information is not intended to replace advice given to you by your health care provider. Make sure you discuss any questions you have with your health care provider. Document Revised: 03/28/2017 Document Reviewed: 11/08/2016 Elsevier Patient Education  2020 [employer] General Anesthesia, Adult General anesthesia is the use of medicines to make a person "go to sleep" (unconscious) for a medical procedure. General anesthesia must be used for certain procedures, and is often recommended for procedures that:  Last a long time.  Require you to be still or in an unusual position.  Are major and can cause blood loss. The medicines used for general anesthesia are called general anesthetics. As well as making you unconscious for a certain amount of time, these medicines:  Prevent pain.  Control your blood pressure.  Relax your muscles. Tell a health care provider about:  Any allergies you have.  All medicines you are taking, including vitamins, herbs, eye drops, creams, and over-the-counter medicines.  Any problems you or family members have had with anesthetic medicines.  Types of anesthetics you have had in the past.  Any blood disorders you have.  Any surgeries you have had.  Any medical conditions you have.  Any recent upper respiratory, chest, or ear infections.  Any history of: ? Heart or lung conditions, such as heart failure, sleep apnea, asthma, or chronic obstructive pulmonary disease (COPD). ? Financial planner. ? Depression or anxiety.  Any tobacco or drug use, including marijuana or alcohol use.  Whether you are pregnant or may be pregnant. What are the risks? Generally, this is a safe procedure. However, problems may occur, including:  Allergic reaction.  Lung and heart problems.  Inhaling food or liquid from the stomach into the lungs  (aspiration).  Nerve injury.  Dental injury.  Air in the bloodstream, which can lead to stroke.  Extreme agitation or confusion (delirium) when you wake up from the anesthetic.  Waking up during your procedure and being unable to move. This is rare. These problems are more likely to develop if you are having a major surgery or if you have an advanced or serious medical condition. You can prevent some of these complications by answering all of your health care provider's questions thoroughly and by following all instructions before your procedure. General anesthesia can cause side effects, including:  Nausea or vomiting.  A sore throat from the breathing tube.  Hoarseness.  Wheezing or coughing.  Shaking chills.  Tiredness.  Body aches.  Anxiety.  Sleepiness or drowsiness.  Confusion or agitation. What happens before the procedure? Staying hydrated Follow instructions from your health care provider about hydration, which may include:  Up to 2 hours before the procedure - you may continue to drink clear liquids, such as water, clear fruit juice, black coffee, and plain tea.  Eating and drinking restrictions Follow instructions from your health care provider about eating and drinking, which may include:  8 hours before the procedure - stop eating heavy meals or foods such as meat, fried foods, or fatty foods.  6 hours before the procedure - stop eating light meals or foods, such as toast or cereal.  6 hours before the procedure - stop drinking milk or drinks that contain milk.  2 hours before the procedure - stop drinking clear liquids. Medicines Ask your health care provider about:  Changing or stopping your regular medicines. This is especially important if you are taking diabetes medicines or blood thinners.  Taking medicines such as aspirin and ibuprofen. These medicines can thin your blood. Do not take these medicines unless your health care provider tells you  to take them.  Taking over-the-counter medicines, vitamins, herbs, and supplements. Do not take these during the week before your procedure unless your health care provider approves them. General instructions  Starting 3-6 weeks before the procedure, do not use any products that contain nicotine or tobacco, such as cigarettes and e-cigarettes. If you need help quitting, ask your health care provider.  If you brush your teeth on the morning of the procedure, make sure to spit out all of the toothpaste.  Tell your health care provider if you become ill or develop a cold, cough, or fever.  If instructed by your health care provider, bring your sleep apnea device with you on the day of your surgery (if applicable).  Ask your health care provider if you will be going home the same day, the following day, or after a longer hospital stay. ? Plan to have someone take you home from the hospital or clinic. ? Plan to have a responsible adult care for you for at least 24 hours after you leave the hospital or clinic. This is important. What happens during the procedure?   You will be given anesthetics through both of the following: ? A mask placed over your nose and mouth. ? An IV in one of your veins.  You may receive a medicine to help you relax (sedative).  After you are unconscious, a breathing tube may be inserted down your throat to help you breathe. This will be removed before you wake up.  An anesthesia specialist will stay with you throughout your procedure. He or she will: ? Keep you comfortable and safe by continuing to give you medicines and adjusting the amount of medicine that you get. ? Monitor your blood pressure, pulse, and oxygen levels to make sure that the anesthetics do not cause any problems. The procedure may vary among health care providers and hospitals. What happens after the procedure?  Your blood pressure, temperature, heart rate, breathing rate, and blood oxygen level  will be monitored until the medicines you were given have worn off.  You will wake up in a recovery area. You may wake up slowly.  If you feel anxious or agitated, you may be given medicine to help you calm down.  If you will be going home the same  day, your health care provider may check to make sure you can walk, drink, and urinate.  Your health care provider will treat any pain or side effects you have before you go home.  Do not drive for 24 hours if you were given a sedative. Summary  General anesthesia is used to keep you still and prevent pain during a procedure.  It is important to tell your health care provider about your medical history and any surgeries you have had, and previous experience with anesthesia.  Follow your health care provider's instructions about when to stop eating, drinking, or taking certain medicines before your procedure.  Plan to have someone take you home from the hospital or clinic. This information is not intended to replace advice given to you by your health care provider. Make sure you discuss any questions you have with your health care provider. Document Revised: 08/12/2017 Document Reviewed: 11/08/2016 Elsevier Patient Education  2020 [employer]

## 2019-08-17 ENCOUNTER — Encounter (HOSPITAL_COMMUNITY)
Admission: RE | Admit: 2019-08-17 | Discharge: 2019-08-17 | Disposition: A | Discharge status: Home / Self Care | Payer: Medicare Other | Source: Ambulatory Visit | Attending: Orthopedic Surgery | Admitting: Orthopedic Surgery

## 2019-08-17 ENCOUNTER — Encounter (HOSPITAL_COMMUNITY): Payer: Self-pay

## 2019-08-17 ENCOUNTER — Other Ambulatory Visit (HOSPITAL_COMMUNITY)
Admission: RE | Admit: 2019-08-17 | Discharge: 2019-08-17 | Disposition: A | Discharge status: Home / Self Care | Payer: Medicare Other | Source: Ambulatory Visit | Attending: Orthopedic Surgery | Admitting: Orthopedic Surgery

## 2019-08-17 ENCOUNTER — Other Ambulatory Visit: Payer: Self-pay

## 2019-08-17 DIAGNOSIS — Z01812 Encounter for preprocedural laboratory examination: Secondary | ICD-10-CM | POA: Diagnosis not present

## 2019-08-17 DIAGNOSIS — Z20822 Contact with and (suspected) exposure to covid-19: Secondary | ICD-10-CM | POA: Insufficient documentation

## 2019-08-17 HISTORY — DX: Pure hypercholesterolemia, unspecified: E78.00

## 2019-08-17 LAB — CBC WITH DIFFERENTIAL/PLATELET
Abs Immature Granulocytes: 0.02 10*3/uL (ref 0.00–0.07)
Basophils Absolute: 0 10*3/uL (ref 0.0–0.1)
Basophils Relative: 0 %
Eosinophils Absolute: 0.5 10*3/uL (ref 0.0–0.5)
Eosinophils Relative: 6 %
HCT: 39.3 % (ref 36.0–46.0)
Hemoglobin: 12.4 g/dL (ref 12.0–15.0)
Immature Granulocytes: 0 %
Lymphocytes Relative: 24 %
Lymphs Abs: 1.9 10*3/uL (ref 0.7–4.0)
MCH: 29.7 pg (ref 26.0–34.0)
MCHC: 31.6 g/dL (ref 30.0–36.0)
MCV: 94 fL (ref 80.0–100.0)
Monocytes Absolute: 0.7 10*3/uL (ref 0.1–1.0)
Monocytes Relative: 9 %
Neutro Abs: 4.9 10*3/uL (ref 1.7–7.7)
Neutrophils Relative %: 61 %
Platelets: 309 10*3/uL (ref 150–400)
RBC: 4.18 MIL/uL (ref 3.87–5.11)
RDW: 14 % (ref 11.5–15.5)
WBC: 8.1 10*3/uL (ref 4.0–10.5)
nRBC: 0 % (ref 0.0–0.2)

## 2019-08-17 LAB — BASIC METABOLIC PANEL
Anion gap: 7 (ref 5–15)
BUN: 10 mg/dL (ref 8–23)
CO2: 26 mmol/L (ref 22–32)
Calcium: 9.3 mg/dL (ref 8.9–10.3)
Chloride: 107 mmol/L (ref 98–111)
Creatinine, Ser: 0.92 mg/dL (ref 0.44–1.00)
GFR calc Af Amer: >60 mL/min (ref >60)
GFR calc non Af Amer: >60 mL/min (ref >60)
Glucose, Bld: 85 mg/dL (ref 70–99)
Potassium: 4 mmol/L (ref 3.5–5.1)
Sodium: 140 mmol/L (ref 135–145)

## 2019-08-17 LAB — SARS CORONAVIRUS 2 (TAT 6-24 HRS): SARS Coronavirus 2: NEGATIVE

## 2019-08-18 NOTE — H&P (Signed)
Kathryn Woods [facility]   06/23/2019   Body mass index is 28.96 kg/m.    HISTORY SECTION :       Chief Complaint  Patient presents with  .           Kathryn Woods Shoulder Pain      left    HPI The patient presents for evaluation of  (mild/moderate/severe/ ) severe pain in the left shoulder for 3 to 4 months associated with decreased range of motion and weakness no prior treatment    She had an MRI of the shoulder which showed a cuff tear    Review of Systems  Constitutional: Negative for fever.  Respiratory: Negative for shortness of breath.   Cardiovascular: Negative for chest pain.  Neurological: Negative for tingling, sensory change and focal weakness.       has a past medical history of Arthritis.         Past Surgical History:  Procedure Laterality Date  . BACK SURGERY      . KNEE ARTHROSCOPY WITH LATERAL MENISECTOMY Right 02/25/2019    Procedure: RIGHT KNEE ARTHROSCOPY WITH LATERAL MENISECTOMY;  Surgeon: Carole Civil, MD;  Location: [facility];  Service: Orthopedics;  Laterality: Right;  . SHOULDER ARTHROSCOPY WITH OPEN ROTATOR CUFF REPAIR Right 10/15/2018    Procedure: SHOULDER ARTHROSCOPY WITH OPEN ROTATOR CUFF REPAIR;  Surgeon: Carole Civil, MD;  Location: [facility];  Service: Orthopedics;  Laterality: Right;  . TUBAL LIGATION          Body mass index is 28.96 kg/m.          Allergies  Allergen Reactions  . Aspirin Other (See Comments)      "Messes stomach up real bad"        Current Outpatient Medications:  .  ibuprofen (ADVIL) 800 MG tablet, Take 1 tablet (800 mg total) by mouth every 8 (eight) hours as needed., Disp: 30 tablet, Rfl: 0 .  meloxicam (MOBIC) 7.5 MG tablet, Take 1 tablet (7.5 mg total) by mouth daily., Disp: 30 tablet, Rfl: 5 .  HYDROcodone-acetaminophen (NORCO/VICODIN) 5-325 MG tablet, Take 1 tablet by mouth every 8 (eight) hours as needed for up to 5 days for moderate pain., Disp: 15 tablet, Rfl: 0     PHYSICAL EXAM  SECTION: 1) BP 136/86   Pulse 80   Temp (!) 97.3 F (36.3 C)   Ht 5\' 5"  (1.651 m)   Wt 174 lb (78.9 kg)   BMI 28.96 kg/m   Body mass index is 28.96 kg/m. General appearance: Well-developed well-nourished no gross deformities  2) Cardiovascular normal pulse and perfusion , normal color   3) Neurologically deep tendon reflexes are equal and normal, no sensation loss or deficits no pathologic reflexes   4) Psychological: Awake alert and oriented x3 mood and affect normal   5) Skin no lacerations or ulcerations no nodularity no palpable masses, no erythema or nodularity   6) Musculoskeletal:    Update right knee full range of motion patient having no pain no swelling feels stable   Left shoulder tenderness posterior acromion anterior deltoid anterior rotator interval with normal external rotation at the arm when it is at her side but poor abduction poor flexion only 120 degrees, less than 90 degrees abduction weakness in the supraspinatus tendon with normal cuff strength everywhere else     MEDICAL DECISION MAKING   A.  Encounter Diagnoses  Name Primary?  . S/P right knee arthroscopy 02/25/2019    . Chronic  left shoulder pain Yes      B. DATA ANALYSED:  IMAGING: Independent interpretation of images: Office images were obtained mild arthritis of the shoulder at the glenohumeral joint AC joint with greater tuberosity sclerosis  CLINICAL DATA:  Shoulder pain for 2 months.  EXAM: MRI OF THE LEFT SHOULDER WITHOUT CONTRAST  TECHNIQUE: Multiplanar, multisequence MR imaging of the shoulder was performed. No intravenous contrast was administered.  COMPARISON:  None.  FINDINGS: Examination limited due to patient motion.  Rotator cuff: Moderate rotator cuff tendinopathy/tendinosis with interstitial tears. There is a small full-thickness retracted tear involving the supraspinatus tendon from its footprint attachment site. Maximum retraction is 12.5 mm and the tear is  12.5 mm wide. The infraspinatus and subscapularis tendons are intact.  Muscles:  No significant findings.  Biceps long head:  Intact  Acromioclavicular Joint: Moderate degenerative changes. Type 2 acromion. No significant lateral downsloping or undersurface spurring.  Glenohumeral Joint: Mild degenerative changes and small joint effusion. Synovitis versus adhesive capsulitis.  Labrum:  No obvious labral tears.  Bones:  No acute bony findings.  Other: Expected fluid in the subacromial/subdeltoid bursa.  IMPRESSION: 1. Moderate rotator cuff tendinopathy/tendinosis with a small full-thickness retracted supraspinatus tendon tear. 2. Intact long head biceps tendon and grossly normal glenoid labrum. 3. Moderate AC joint degenerative changes but no other significant findings for bony impingement. 4. Mild glenohumeral joint degenerative changes with small joint effusion and synovitis versus adhesive capsulitis.   Electronically Signed   By: Rudie Meyer M.D.   On: 07/23/2019   Outside records reviewed: None  C. MANAGEMENT

## 2019-08-19 ENCOUNTER — Ambulatory Visit (HOSPITAL_COMMUNITY): Payer: Medicare Other | Admitting: Anesthesiology

## 2019-08-19 ENCOUNTER — Encounter: Payer: Self-pay | Admitting: Orthopedic Surgery

## 2019-08-19 ENCOUNTER — Encounter (HOSPITAL_COMMUNITY): Payer: Self-pay | Admitting: Orthopedic Surgery

## 2019-08-19 ENCOUNTER — Ambulatory Visit (HOSPITAL_COMMUNITY)
Admission: RE | Admit: 2019-08-19 | Discharge: 2019-08-19 | Disposition: A | Discharge status: Home / Self Care | Payer: Medicare Other | Attending: Orthopedic Surgery | Admitting: Orthopedic Surgery

## 2019-08-19 ENCOUNTER — Encounter (HOSPITAL_COMMUNITY)
Admission: RE | Disposition: A | Discharge status: Home / Self Care | Payer: Self-pay | Source: Home / Self Care | Attending: Orthopedic Surgery

## 2019-08-19 DIAGNOSIS — M75112 Incomplete rotator cuff tear or rupture of left shoulder, not specified as traumatic: Secondary | ICD-10-CM | POA: Diagnosis not present

## 2019-08-19 DIAGNOSIS — Z886 Allergy status to analgesic agent status: Secondary | ICD-10-CM | POA: Diagnosis not present

## 2019-08-19 DIAGNOSIS — G8929 Other chronic pain: Secondary | ICD-10-CM | POA: Insufficient documentation

## 2019-08-19 DIAGNOSIS — M75122 Complete rotator cuff tear or rupture of left shoulder, not specified as traumatic: Secondary | ICD-10-CM

## 2019-08-19 DIAGNOSIS — M19012 Primary osteoarthritis, left shoulder: Secondary | ICD-10-CM | POA: Diagnosis not present

## 2019-08-19 DIAGNOSIS — G8918 Other acute postprocedural pain: Secondary | ICD-10-CM | POA: Diagnosis not present

## 2019-08-19 DIAGNOSIS — M75102 Unspecified rotator cuff tear or rupture of left shoulder, not specified as traumatic: Secondary | ICD-10-CM | POA: Diagnosis not present

## 2019-08-19 DIAGNOSIS — Z791 Long term (current) use of non-steroidal anti-inflammatories (NSAID): Secondary | ICD-10-CM | POA: Diagnosis not present

## 2019-08-19 HISTORY — PX: SHOULDER ARTHROSCOPY WITH ROTATOR CUFF REPAIR: SHX5685

## 2019-08-19 SURGERY — ARTHROSCOPY, SHOULDER, WITH ROTATOR CUFF REPAIR
Anesthesia: General | Site: Shoulder | Laterality: Left

## 2019-08-19 MED ORDER — BUPIVACAINE HCL (PF) 0.5 % IJ SOLN
INTRAMUSCULAR | Status: DC | PRN
Start: 2019-08-19 — End: 2019-08-19
  Administered 2019-08-19: 22 mL via PERINEURAL

## 2019-08-19 MED ORDER — HYDROCODONE-ACETAMINOPHEN 10-325 MG PO TABS: 1.0000 | ORAL_TABLET | ORAL | 0 refills | Status: DC | PRN

## 2019-08-19 MED ORDER — LIDOCAINE HCL (PF) 1 % IJ SOLN
INTRAMUSCULAR | Status: DC | PRN
  Administered 2019-08-19: 3 mL

## 2019-08-19 MED ORDER — PHENYLEPHRINE HCL (PRESSORS) 10 MG/ML IV SOLN
INTRAVENOUS | Status: DC | PRN
  Administered 2019-08-19: 100 ug via INTRAVENOUS
  Administered 2019-08-19: 40 ug via INTRAVENOUS
  Administered 2019-08-19: 100 ug via INTRAVENOUS
  Administered 2019-08-19 (×2): 80 ug via INTRAVENOUS

## 2019-08-19 MED ORDER — LIDOCAINE 2% (20 MG/ML) 5 ML SYRINGE
INTRAMUSCULAR | Status: AC
  Filled 2019-08-19: qty 5

## 2019-08-19 MED ORDER — BUPIVACAINE-EPINEPHRINE (PF) 0.5% -1:200000 IJ SOLN
INTRAMUSCULAR | Status: AC
  Filled 2019-08-19: qty 60

## 2019-08-19 MED ORDER — PROMETHAZINE HCL 12.5 MG PO TABS
12.5000 mg | ORAL_TABLET | Freq: Four times a day (QID) | ORAL | 0 refills | Status: DC | PRN
Start: 2019-08-19 — End: 2020-01-20

## 2019-08-19 MED ORDER — PREGABALIN 50 MG PO CAPS: 50.0000 mg | ORAL_CAPSULE | Freq: Once | ORAL | Status: DC

## 2019-08-19 MED ORDER — DEXAMETHASONE SODIUM PHOSPHATE 4 MG/ML IJ SOLN
INTRAMUSCULAR | Status: AC
  Filled 2019-08-19: qty 2

## 2019-08-19 MED ORDER — PROPOFOL 10 MG/ML IV BOLUS
INTRAVENOUS | Status: AC
  Filled 2019-08-19: qty 20

## 2019-08-19 MED ORDER — LACTATED RINGERS IV SOLN: Freq: Once | INTRAVENOUS | Status: AC

## 2019-08-19 MED ORDER — DEXAMETHASONE SODIUM PHOSPHATE 4 MG/ML IJ SOLN
INTRAMUSCULAR | Status: DC | PRN
  Administered 2019-08-19: 8 mg via INTRAVENOUS

## 2019-08-19 MED ORDER — TIZANIDINE HCL 4 MG PO TABS
4.0000 mg | ORAL_TABLET | Freq: Three times a day (TID) | ORAL | 2 refills | Status: AC
Start: 2019-08-19 — End: 2019-08-29

## 2019-08-19 MED ORDER — EPHEDRINE SULFATE 50 MG/ML IJ SOLN
INTRAMUSCULAR | Status: DC | PRN
  Administered 2019-08-19 (×4): 10 mg via INTRAVENOUS

## 2019-08-19 MED ORDER — DEXAMETHASONE SODIUM PHOSPHATE 4 MG/ML IJ SOLN
INTRAMUSCULAR | Status: DC | PRN
Start: 2019-08-19 — End: 2019-08-19

## 2019-08-19 MED ORDER — 0.9 % SODIUM CHLORIDE (POUR BTL) OPTIME
TOPICAL | Status: DC | PRN
  Administered 2019-08-19: 1000 mL

## 2019-08-19 MED ORDER — MIDAZOLAM HCL 2 MG/2ML IJ SOLN
2.0000 mg | Freq: Once | INTRAMUSCULAR | Status: AC
  Administered 2019-08-19: 2 mg via INTRAVENOUS

## 2019-08-19 MED ORDER — CELECOXIB 400 MG PO CAPS: 400.0000 mg | ORAL_CAPSULE | Freq: Once | ORAL | Status: DC

## 2019-08-19 MED ORDER — DEXAMETHASONE SODIUM PHOSPHATE 4 MG/ML IJ SOLN
INTRAMUSCULAR | Status: DC | PRN
  Administered 2019-08-19: 6 mg via PERINEURAL

## 2019-08-19 MED ORDER — LIDOCAINE HCL URETHRAL/MUCOSAL 2 % EX GEL
CUTANEOUS | Status: DC | PRN
  Administered 2019-08-19: 100 via TOPICAL

## 2019-08-19 MED ORDER — CEFAZOLIN SODIUM-DEXTROSE 2-4 GM/100ML-% IV SOLN
INTRAVENOUS | Status: AC
  Filled 2019-08-19: qty 100

## 2019-08-19 MED ORDER — ONDANSETRON HCL 4 MG/2ML IJ SOLN
INTRAMUSCULAR | Status: AC
  Filled 2019-08-19: qty 2

## 2019-08-19 MED ORDER — SODIUM CHLORIDE 0.9 % IR SOLN
Status: DC | PRN
  Administered 2019-08-19 (×4): 3000 mL

## 2019-08-19 MED ORDER — FENTANYL CITRATE (PF) 100 MCG/2ML IJ SOLN
INTRAMUSCULAR | Status: AC
  Filled 2019-08-19: qty 2

## 2019-08-19 MED ORDER — EPINEPHRINE PF 1 MG/ML IJ SOLN
INTRAMUSCULAR | Status: AC
  Filled 2019-08-19: qty 5

## 2019-08-19 MED ORDER — PROPOFOL 10 MG/ML IV BOLUS
INTRAVENOUS | Status: DC | PRN
  Administered 2019-08-19: 150 mg via INTRAVENOUS

## 2019-08-19 MED ORDER — ONDANSETRON HCL 4 MG/2ML IJ SOLN: 4.0000 mg | Freq: Once | INTRAMUSCULAR | Status: DC

## 2019-08-19 MED ORDER — LIDOCAINE HCL (PF) 1 % IJ SOLN
INTRAMUSCULAR | Status: AC
  Filled 2019-08-19: qty 30

## 2019-08-19 MED ORDER — LACTATED RINGERS IV SOLN: INTRAVENOUS | Status: DC | PRN

## 2019-08-19 MED ORDER — METHOCARBAMOL 1000 MG/10ML IJ SOLN
500.0000 mg | Freq: Once | INTRAVENOUS | Status: AC
  Administered 2019-08-19: 500 mg via INTRAVENOUS
  Filled 2019-08-19: qty 5

## 2019-08-19 MED ORDER — ONDANSETRON HCL 4 MG/2ML IJ SOLN
INTRAMUSCULAR | Status: DC | PRN
  Administered 2019-08-19: 4 mg via INTRAVENOUS

## 2019-08-19 MED ORDER — MIDAZOLAM HCL 2 MG/2ML IJ SOLN
INTRAMUSCULAR | Status: AC
  Filled 2019-08-19: qty 2

## 2019-08-19 MED ORDER — FENTANYL CITRATE (PF) 100 MCG/2ML IJ SOLN
INTRAMUSCULAR | Status: DC | PRN
  Administered 2019-08-19 (×2): 50 ug via INTRAVENOUS

## 2019-08-19 MED ORDER — DEXAMETHASONE SODIUM PHOSPHATE 10 MG/ML IJ SOLN
INTRAMUSCULAR | Status: AC
  Filled 2019-08-19: qty 1

## 2019-08-19 MED ORDER — IBUPROFEN 800 MG PO TABS
800.0000 mg | ORAL_TABLET | Freq: Three times a day (TID) | ORAL | 1 refills | Status: DC | PRN
Start: 2019-08-19 — End: 2021-02-06

## 2019-08-19 MED ORDER — MEPERIDINE HCL 50 MG/ML IJ SOLN: 6.2500 mg | INTRAMUSCULAR | Status: DC | PRN

## 2019-08-19 MED ORDER — ONDANSETRON HCL 4 MG/2ML IJ SOLN: 4.0000 mg | Freq: Once | INTRAMUSCULAR | Status: DC | PRN

## 2019-08-19 MED ORDER — SUGAMMADEX SODIUM 200 MG/2ML IV SOLN
INTRAVENOUS | Status: DC | PRN
  Administered 2019-08-19: 200 mg via INTRAVENOUS

## 2019-08-19 MED ORDER — BUPIVACAINE HCL (PF) 0.5 % IJ SOLN
INTRAMUSCULAR | Status: AC
  Filled 2019-08-19: qty 30

## 2019-08-19 MED ORDER — CEFAZOLIN SODIUM-DEXTROSE 2-4 GM/100ML-% IV SOLN
2.0000 g | INTRAVENOUS | Status: AC
  Administered 2019-08-19: 2 g via INTRAVENOUS

## 2019-08-19 MED ORDER — PHENYLEPHRINE 40 MCG/ML (10ML) SYRINGE FOR IV PUSH (FOR BLOOD PRESSURE SUPPORT)
PREFILLED_SYRINGE | INTRAVENOUS | Status: AC
  Filled 2019-08-19: qty 10

## 2019-08-19 MED ORDER — ROCURONIUM BROMIDE 100 MG/10ML IV SOLN
INTRAVENOUS | Status: DC | PRN
  Administered 2019-08-19: 50 mg via INTRAVENOUS

## 2019-08-19 MED ORDER — ROCURONIUM BROMIDE 10 MG/ML (PF) SYRINGE
PREFILLED_SYRINGE | INTRAVENOUS | Status: AC
  Filled 2019-08-19: qty 10

## 2019-08-19 MED ORDER — BUPIVACAINE-EPINEPHRINE (PF) 0.5% -1:200000 IJ SOLN
INTRAMUSCULAR | Status: DC | PRN
  Administered 2019-08-19: 10 mL via PERINEURAL

## 2019-08-19 MED ORDER — HYDROMORPHONE HCL 1 MG/ML IJ SOLN: 0.2500 mg | INTRAMUSCULAR | Status: DC | PRN

## 2019-08-19 MED ORDER — OXYCODONE HCL 5 MG PO TABS: 5.0000 mg | ORAL_TABLET | Freq: Once | ORAL | Status: DC

## 2019-08-19 SURGICAL SUPPLY — 66 items
ANCH SUT SWLK 19.1X4.75 (Anchor) ×1 IMPLANT
ANCHOR SUT BIO SW 4.75X19.1 (Anchor) ×1 IMPLANT
APL PRP STRL LF DISP 70% ISPRP (MISCELLANEOUS) ×1
APL SKNCLS STERI-STRIP NONHPOA (GAUZE/BANDAGES/DRESSINGS) ×1
BENZOIN TINCTURE PRP APPL 2/3 (GAUZE/BANDAGES/DRESSINGS) ×2 IMPLANT
BLADE AGGRESSIVE PLUS 4.0 (BLADE) ×1 IMPLANT
BLADE EXCALIBUR 4.0X13 (MISCELLANEOUS) ×1 IMPLANT
BLADE HEX COATED 2.75 (ELECTRODE) ×2 IMPLANT
BURR OVAL 8 FLU 4.0X13 (MISCELLANEOUS) ×1 IMPLANT
CANNULA DRILOCK 5.0X75 (CANNULA) ×2 IMPLANT
CHLORAPREP W/TINT 26 (MISCELLANEOUS) ×2 IMPLANT
CLOTH BEACON ORANGE TIMEOUT ST (SAFETY) ×2 IMPLANT
CLSR STERI-STRIP ANTIMIC 1/2X4 (GAUZE/BANDAGES/DRESSINGS) ×2 IMPLANT
COVER LIGHT HANDLE STERIS (MISCELLANEOUS) ×4 IMPLANT
COVER WAND RF STERILE (DRAPES) ×2 IMPLANT
DECANTER SPIKE VIAL GLASS SM (MISCELLANEOUS) ×1 IMPLANT
DISSECTOR 4.0MMX13CM CVD (MISCELLANEOUS) ×1 IMPLANT
DRAPE ORTHO 2.5IN SPLIT 77X108 (DRAPES) ×2 IMPLANT
DRAPE ORTHO SPLIT 77X108 STRL (DRAPES) ×4
DRAPE SHOULDER BEACH CHAIR (DRAPES) ×1 IMPLANT
DRAPE U-SHAPE 47X51 STRL (DRAPES) ×1 IMPLANT
DRESSING MEPILEX BORDER 6X8 (GAUZE/BANDAGES/DRESSINGS) ×1 IMPLANT
DRSG MEPILEX BORDER 6X8 (GAUZE/BANDAGES/DRESSINGS) ×2
ELECT REM PT RETURN 9FT ADLT (ELECTROSURGICAL) ×2
ELECTRODE REM PT RTRN 9FT ADLT (ELECTROSURGICAL) ×1 IMPLANT
GLOVE BIO SURGEON STRL SZ7 (GLOVE) ×1 IMPLANT
GLOVE BIOGEL PI IND STRL 7.0 (GLOVE) ×3 IMPLANT
GLOVE BIOGEL PI INDICATOR 7.0 (GLOVE) ×3
GLOVE SKINSENSE NS SZ8.0 LF (GLOVE) ×1
GLOVE SKINSENSE STRL SZ8.0 LF (GLOVE) ×1 IMPLANT
GLOVE SS N UNI LF 8.5 STRL (GLOVE) ×2 IMPLANT
GOWN STRL REUS W/TWL LRG LVL3 (GOWN DISPOSABLE) ×4 IMPLANT
GOWN STRL REUS W/TWL XL LVL3 (GOWN DISPOSABLE) ×2 IMPLANT
INST SET MINOR BONE (KITS) ×2 IMPLANT
IV NS IRRIG 3000ML ARTHROMATIC (IV SOLUTION) ×4 IMPLANT
KIT BLADEGUARD II DBL (SET/KITS/TRAYS/PACK) ×2 IMPLANT
KIT POSITION SHOULDER SCHLEI (MISCELLANEOUS) ×1 IMPLANT
KIT TURNOVER KIT A (KITS) ×2 IMPLANT
MANIFOLD NEPTUNE II (INSTRUMENTS) ×2 IMPLANT
MARKER SKIN DUAL TIP RULER LAB (MISCELLANEOUS) ×2 IMPLANT
NDL HYPO 18GX1.5 BLUNT FILL (NEEDLE) IMPLANT
NDL HYPO 21X1.5 SAFETY (NEEDLE) ×1 IMPLANT
NDL SCORPION MULTI FIRE (NEEDLE) IMPLANT
NEEDLE HYPO 18GX1.5 BLUNT FILL (NEEDLE) ×2 IMPLANT
NEEDLE HYPO 21X1.5 SAFETY (NEEDLE) ×2 IMPLANT
NEEDLE SCORPION MULTI FIRE (NEEDLE) ×2 IMPLANT
NS IRRIG 1000ML POUR BTL (IV SOLUTION) ×2 IMPLANT
PACK TOTAL JOINT (CUSTOM PROCEDURE TRAY) ×1 IMPLANT
PAD ARMBOARD 7.5X6 YLW CONV (MISCELLANEOUS) ×2 IMPLANT
PENCIL SMOKE EVACUATOR (MISCELLANEOUS) ×1 IMPLANT
PORT APPOLLO RF 90DEGREE MULTI (SURGICAL WAND) ×1 IMPLANT
SET ARTHROSCOPY INST (INSTRUMENTS) ×1 IMPLANT
SET BASIN LINEN APH (SET/KITS/TRAYS/PACK) ×2 IMPLANT
SET TUBE SUCT SHAVER OUTFL 24K (TUBING) ×1 IMPLANT
SLING ARM IMMOBILIZER LRG (SOFTGOODS) ×1 IMPLANT
STOCKINETTE IMPERVIOUS LG (DRAPES) ×1 IMPLANT
STRIP CLOSURE SKIN 1/2X4 (GAUZE/BANDAGES/DRESSINGS) ×1 IMPLANT
SUT ETHIBOND NAB OS 4 #2 30IN (SUTURE) ×2 IMPLANT
SUT MON AB 0 CT1 (SUTURE) ×2 IMPLANT
SUT MON AB 2-0 CT1 36 (SUTURE) ×2 IMPLANT
SYR 30ML LL (SYRINGE) ×1 IMPLANT
SYR BULB IRRIG 60ML STRL (SYRINGE) ×2 IMPLANT
TAPE FIBER 2MM 7IN #2 BLUE (SUTURE) ×1 IMPLANT
TUBING ARTHRO INFLOW-ONLY STRL (TUBING) ×1 IMPLANT
YANKAUER SUCT 12FT TUBE ARGYLE (SUCTIONS) ×1 IMPLANT
YANKAUER SUCT BULB TIP 10FT TU (MISCELLANEOUS) ×2 IMPLANT

## 2019-08-19 NOTE — Anesthesia Procedure Notes (Addendum)
Anesthesia Regional Block: Interscalene brachial plexus block   Pre-Anesthetic Checklist: ,, timeout performed, Correct Patient, Correct Site, Correct Laterality, Correct Procedure, Correct Position, site marked, Risks and benefits discussed,  Surgical consent,  Pre-op evaluation,  At surgeon's request and post-op pain management  Laterality: Upper and Left  Prep: chloraprep       Needles:  Injection technique: Single-shot  Needle Type: Echogenic Stimulator Needle     Needle Length: 5cm  Needle Gauge: 22   Needle insertion depth: 4 cm   Additional Needles:   Procedures:, nerve stimulator,,,,,,,  Narrative:  Start time: 08/19/2019 7:17 AM End time: 08/19/2019 7:32 AM Injection made incrementally with aspirations every 5 mL.  Performed by: Personally  Anesthesiologist: Molli Barrows, MD  Additional Notes: Bupivacaine 0.5% 20 ml with dexamethasone 4 mg at interscalene block and bupivacaine 0.5% 7 ml with dexamethasone 2 mg at superficial cervical plexus block was injected. Block assessed prior to start of surgery

## 2019-08-19 NOTE — Interval H&P Note (Signed)
History and Physical Interval Note:  08/19/2019 7:24 AM  Kathryn Woods  has presented today for surgery, with the diagnosis of left shoulder rotator cuff tear.  The various methods of treatment have been discussed with the patient and family. After consideration of risks, benefits and other options for treatment, the patient has consented to  Procedure(s) with comments: ROTATOR CUFF REPAIR SHOULDER OPEN // ARTHROSCOPIC DEBRIDEMENT LEFT SHOULDER (Left) - With scalene block  as a surgical intervention.  The patient's history has been reviewed, patient examined, no change in status, stable for surgery.  I have reviewed the patient's chart and labs.  Questions were answered to the patient's satisfaction.     Fuller Canada

## 2019-08-19 NOTE — Transfer of Care (Signed)
Immediate Anesthesia Transfer of Care Note  Patient: Kathryn Woods  Procedure(s) Performed: LEFT SHOULDER ARTHROSCOPY WITH ROTATOR CUFF REPAIR (Left Shoulder)  Patient Location: PACU  Anesthesia Type:General  Level of Consciousness: awake, alert , oriented and patient cooperative  Airway & Oxygen Therapy: Patient Spontanous Breathing and Patient connected to face mask oxygen  Post-op Assessment: Report given to RN, Post -op Vital signs reviewed and stable and Patient moving all extremities X 4  Post vital signs: Reviewed and stable  Last Vitals:  Vitals Value Taken Time  BP 158/93 08/19/19 0937  Temp    Pulse 86 08/19/19 0938  Resp 14 08/19/19 0938  SpO2 100 % 08/19/19 0938  Vitals shown include unvalidated device data.  Last Pain:  Vitals:   08/19/19 0700  TempSrc: Oral  PainSc: 6       Patients Stated Pain Goal: 6 (08/19/19 0700)  Complications: No apparent anesthesia complications

## 2019-08-19 NOTE — Brief Op Note (Signed)
08/19/2019  9:27 AM  PATIENT:  Kathryn Woods  69 y.o. female  PRE-OPERATIVE DIAGNOSIS:  left shoulder rotator cuff tear  POST-OPERATIVE DIAGNOSIS: Left shoulder rotator cuff tear supraspinatus  Arthroscopic findings: Partial tear intra-articular portion of biceps tendon Degenerative tear superior labrum Glenohumeral erosion grade 4 posterior glenoid Extensive bursitis subacromial space Rotator cuff tear 1 cm retracted 1-1/2 cm front to back   PROCEDURE:  Procedure(s) with comments: Arthroscopic rotator cuff repair LEFT SHOULDER (Left) - With scalene block   IMPLANT: FiberWire and Arthrex suture anchor swivel lock  SURGEON:  Surgeon(s) and Role:    Vickki Hearing, MD - Primary  PHYSICIAN ASSISTANT:   ASSISTANTS: Marisa Severin  ANESTHESIA:   general with preop scalene block EBL:  15 mL   BLOOD ADMINISTERED:none  DRAINS: none   LOCAL MEDICATIONS USED:  MARCAINE     SPECIMEN:  No Specimen  DISPOSITION OF SPECIMEN:  N/A  COUNTS:  YES  TOURNIQUET:  * No tourniquets in log *  DICTATION: .Dragon Dictation  PLAN OF CARE: Discharge to home after PACU  PATIENT DISPOSITION:  PACU - hemodynamically stable.   Delay start of Pharmacological VTE agent (>24hrs) due to surgical blood loss or risk of bleeding: not applicable  Details of procedure  The patient was given a preop scalene block by anesthesia.  She was identified with 2 appropriate markers in the left shoulder was confirmed and marked as the surgical site.  Complete chart review and image review and implant review was performed prior to surgery  The patient was taken to the operating room for general anesthesia she was then placed in the modified beachchair position with appropriate padding.  The left arm was examined under anesthesia, she had full range of motion.  After sterile prep and drape timeout was completed  Posterior portal was injected with Marcaine with epinephrine 11 blade was  used to make a skin incision and the scope was placed into the joint and diagnostic arthroscopy was completed.  A anterior portal was made lateral to the coracoid and above the subscapularis appropriate cannula was placed in the joint was inspected with a probe  Shaver was placed into the joint and the biceps tendon which was torn 20 half centimeters distal to its insertion was debrided.  The superior labrum was debrided.  The cuff tear was also debrided.  The scope was then placed into the subacromial space where a significant thickened bursal tissue was encountered and this was removed through a separate lateral portal  Shaver and 90 degree wand was used to debride the tissue until the rotator cuff tear was identified.  The tear cuff tear was 1 cm retracted with 1-1/2 cm tear front to back classic crescent tear  After debridement of the greater tuberosity with a bur and debridement of the edge of the tendon a arthroscopic tissue grasper was used to evaluate the excursion of the tendon which was excellent.  We placed a FiberWire suture inverted mattress style and then extended the lateral portal used finger dissection to split the deltoid placed to retractors and placed Arthrex anchor using a punch threading the suture through the end of the anchor and then securing the anchor to the lateral humerus.  This gave excellent reapproximation of the tendon to bone.  The wound was irrigated and closed with 0 Monocryl for the deltoid split, 0 Monocryl in the deep subcu tissue and then a running 2-0 Monocryl to close the skin.  The other 2 portals  were closed with 2-0 Monocryl.  Sterile dressing was applied  Patient was extubated and a sling was applied she was then taken to recovery room in stable condition  

## 2019-08-19 NOTE — Discharge Instructions (Signed)
Monitored Anesthesia Care, Care After These instructions provide you with information about caring for yourself after your procedure. Your health care provider may also give you more specific instructions. Your treatment has been planned according to current medical practices, but problems sometimes occur. Call your health care provider if you have any problems or questions after your procedure. What can I expect after the procedure? After your procedure, you may:  Feel sleepy for several hours.  Feel clumsy and have poor balance for several hours.  Feel forgetful about what happened after the procedure.  Have poor judgment for several hours.  Feel nauseous or vomit.  Have a sore throat if you had a breathing tube during the procedure. Follow these instructions at home: For at least 24 hours after the procedure:      Have a responsible adult stay with you. It is important to have someone help care for you until you are awake and alert.  Rest as needed.  Do not: ? Participate in activities in which you could fall or become injured. ? Drive. ? Use heavy machinery. ? Drink alcohol. ? Take sleeping pills or medicines that cause drowsiness. ? Make important decisions or sign legal documents. ? Take care of children on your own. Eating and drinking  Follow the diet that is recommended by your health care provider.  If you vomit, drink water, juice, or soup when you can drink without vomiting.  Make sure you have little or no nausea before eating solid foods. General instructions  Take over-the-counter and prescription medicines only as told by your health care provider.  If you have sleep apnea, surgery and certain medicines can increase your risk for breathing problems. Follow instructions from your health care provider about wearing your sleep device: ? Anytime you are sleeping, including during daytime naps. ? While taking prescription pain medicines, sleeping medicines,  or medicines that make you drowsy.  If you smoke, do not smoke without supervision.  Keep all follow-up visits as told by your health care provider. This is important. Contact a health care provider if:  You keep feeling nauseous or you keep vomiting.  You feel light-headed.  You develop a rash.  You have a fever. Get help right away if:  You have trouble breathing. Summary  For several hours after your procedure, you may feel sleepy and have poor judgment.  Have a responsible adult stay with you for at least 24 hours or until you are awake and alert. This information is not intended to replace advice given to you by your health care provider. Make sure you discuss any questions you have with your health care provider. Document Revised: 06/23/2017 Document Reviewed: 07/16/2015 Elsevier Patient Education  2020 Elsevier Inc.     Interscalene Nerve Block An interscalene nerve block is an injection of numbing medicine into a group of nerves in your neck called the brachial plexus. These nerves control feeling and movement in your shoulder and upper arm. You may have this procedure to:  Numb your shoulder and upper arm during a surgical procedure.  Help relieve pain in your shoulder or upper arm after surgery.  Decrease the amount of anesthetic medicine you need during surgery.  Decrease the amount of pain medicine you need after surgery. Compared to having general anesthetic alone, the benefits of having this procedure include:  Better control of pain after surgery.  A faster recovery with fewer side effects. Tell a health care provider about:  Any allergies you have.  All medicines you are taking, including vitamins, herbs, eye drops, creams, and over-the-counter medicines.  Types of surgeries and anesthetics you have had in the past.  Any problems you or family members have had with anesthetic medicines.  Any blood disorders you have.  Any surgeries you have  had.  Any medical conditions you have.  Whether you are pregnant or may be pregnant.  Whether you smoke, drink alcohol, use marijuana, or use street drugs. What are the risks? Generally, this is a safe procedure. However, problems may occur, including:  Damage to nerves, blood vessels, or other structures of the neck.  Bleeding.  Infection.  Numbness, tingling, or weakness of the shoulder or arm.  Numbing of other nerves near the brachial plexus. This may cause hoarseness, eye droop, lip droop, dry eye, redness in the eye, shortness of breath, or chest tightness.  Lung damage that causes the lung to collapse (pneumothorax).  Reaction to medicines, such as seizures or heart problems. What happens before the procedure? Staying hydrated Follow instructions from your health care provider about hydration, which may include:  Up to 2 hours before the procedure - you may continue to drink clear liquids, such as water, clear fruit juice, black coffee, and plain tea. Eating and drinking restrictions Follow instructions from your health care provider about eating and drinking, which may include:  8 hours before the procedure - stop eating heavy meals or foods such as meat, fried foods, or fatty foods.  6 hours before the procedure - stop eating light meals or foods, such as toast or cereal.  6 hours before the procedure - stop drinking milk or drinks that contain milk.  2 hours before the procedure - stop drinking clear liquids. Medicines Ask your health care provider about:  Changing or stopping your regular medicines. This is especially important if you are taking diabetes medicines or blood thinners.  Taking medicines such as aspirin and ibuprofen. These medicines can thin your blood. Do not take these medicines before your procedure if your health care provider instructs you not to. General instructions  Ask if you will be going home the same day as the procedure or the next  day. If you will be able to go home soon after the procedure: ? Plan to have someone take you home. ? Plan to have someone stay with you for the first 24 hours after the procedure.  For 3-6 weeks before the procedure, try not to use any products that contain nicotine or tobacco, such as cigarettes and e-cigarettes.  You may brush your teeth on the morning of the procedure, but make sure to spit out the toothpaste. What happens during the procedure?  To reduce your risk of infection: ? Your health care team will wash or sanitize their hands. ? Your skin will be cleaned with soap.  An IV tube will be placed into one of your veins, probably in the arm that is not on the same side as the block.  You will be given one or more of the following medicines: ? A medicine to help you relax (sedative). ? A medicine to make you fall asleep (general anesthetic). ? A medicine to help decrease pain (analgesic).  You will be asked to rest on your back, usually with your head turned slightly to the side.  Your health care provider will feel your neck above your collarbone to locate the muscles over your brachial plexus.  A medicine will be injected in the skin of your  neck to numb the skin where the block will be placed (local anesthetic).  A longer needle will be inserted through your skin towardthe brachial plexus.  Ultrasound imaging will be used to help locate your brachial plexus.  A needle with an electrical current may be used to stimulate and identify the correct nerves. This may cause the muscles in your shoulder or arm to twitch.  The numbing medicine for the brachial plexus will be injected.  The needle will be removed.  If you have a continuous block, a thin, flexible tube (catheter) may be left in place to deliver medicine to the nerves continuously, often for 1-2 days.  A clear bandage (dressing) may be placed over the needle puncture site.  If you have a continuous block,  additional dressings may be added to secure the tubing. The procedure may vary among health care providers and hospitals. What happens after the procedure? After the block is placed, you will be monitored and the effect of the block will be checked. Your shoulder and upper arm should be numb, and your fingers may also feel numb and tingle. If you have a surgical procedure right after you have the block, these things may happen after the surgical procedure is complete:  You will be monitored closely in the recovery area.  Your arm may remain numb several hours afterward. If you have a continuous block catheter in place, your arm may remain numb for longer. It is important to keep your arm safe from injury while it is numb. You may have to wear a sling to protect your arm.  As the nerve block medicine wears off: ? You may begin to feel pain at the surgical site. If this happens, follow instructions from your health care provider about taking other pain medicine. ? You may experience tingling in your arm and hand. ? You will regain strength in your muscles.  Do not drive for 24 hours if you were given a sedative. Summary  An interscalene nerve block is an injection of numbing medicine into a group of nerves in your neck that control your shoulder and upper arm.  Compared to having general anesthetic alone, the benefits of having this procedure include better control of pain after surgery and a faster recovery with fewer side effects.  Your arm may remain numb several hours afterward. If you have a continuous block catheter in place, your arm may remain numb for longer. It is important to keep your arm safe from injury while it is numb. You may have to wear a sling to protect your arm. This information is not intended to replace advice given to you by your health care provider. Make sure you discuss any questions you have with your health care provider. Document Revised: 03/28/2017 Document  Reviewed: 11/24/2015 Elsevier Patient Education  2020 [employer].

## 2019-08-19 NOTE — Anesthesia Preprocedure Evaluation (Addendum)
Anesthesia Evaluation  Patient identified by MRN, date of birth, ID band Patient awake    Reviewed: Allergy & Precautions, NPO status , Patient's Chart, lab work & pertinent test results  History of Anesthesia Complications Negative for: history of anesthetic complications  Airway Mallampati: II  TM Distance: >3 FB Neck ROM: Full    Dental  (+) Lower Dentures, Upper Dentures   Pulmonary former smoker,    Pulmonary exam normal breath sounds clear to auscultation       Cardiovascular Exercise Tolerance: Good Normal cardiovascular exam Rhythm:Regular Rate:Normal     Neuro/Psych  Headaches,    GI/Hepatic negative GI ROS, Neg liver ROS,   Endo/Other  negative endocrine ROS  Renal/GU negative Renal ROS     Musculoskeletal  (+) Arthritis  (back pain), Osteoarthritis,    Abdominal   Peds  Hematology negative hematology ROS (+)   Anesthesia Other Findings   Reproductive/Obstetrics                            Anesthesia Physical  Anesthesia Plan  ASA: II  Anesthesia Plan: General   Post-op Pain Management:  Regional for Post-op pain   Induction:   PONV Risk Score and Plan: 4 or greater and Dexamethasone and Ondansetron  Airway Management Planned: Oral ETT  Additional Equipment:   Intra-op Plan:   Post-operative Plan: Extubation in OR  Informed Consent: I have reviewed the patients History and Physical, chart, labs and discussed the procedure including the risks, benefits and alternatives for the proposed anesthesia with the patient or authorized representative who has indicated his/her understanding and acceptance.     Dental advisory given  Plan Discussed with: CRNA and Surgeon  Anesthesia Plan Comments:        Anesthesia Quick Evaluation

## 2019-08-19 NOTE — Progress Notes (Signed)
Patient able to open and close left eyelid without difficulty, with some residual drooping of left eyelid. Dr. Alva Garnet aware. Patient notified will improve as block wears off per Dr. Alva Garnet.

## 2019-08-19 NOTE — Anesthesia Procedure Notes (Signed)
Procedure Name: Intubation Date/Time: 08/19/2019 7:56 AM Performed by: Jonna Munro, CRNA Pre-anesthesia Checklist: Patient identified, Emergency Drugs available, Suction available, Patient being monitored and Timeout performed Patient Re-evaluated:Patient Re-evaluated prior to induction Oxygen Delivery Method: Circle system utilized Preoxygenation: Pre-oxygenation with 100% oxygen Induction Type: IV induction Ventilation: Mask ventilation without difficulty Laryngoscope Size: Mac and 3 Grade View: Grade I Tube type: Oral Tube size: 7.0 mm Number of attempts: 1 Airway Equipment and Method: Stylet Placement Confirmation: ETT inserted through vocal cords under direct vision,  positive ETCO2 and breath sounds checked- equal and bilateral Secured at: 22 cm Tube secured with: Tape Dental Injury: Teeth and Oropharynx as per pre-operative assessment

## 2019-08-19 NOTE — Anesthesia Postprocedure Evaluation (Signed)
Anesthesia Post Note  Patient: Kathryn Woods  Procedure(s) Performed: LEFT SHOULDER ARTHROSCOPY WITH ROTATOR CUFF REPAIR (Left Shoulder)  Patient location during evaluation: PACU Anesthesia Type: General Pain management: pain level controlled Vital Signs Assessment: post-procedure vital signs reviewed and stable Respiratory status: spontaneous breathing Cardiovascular status: blood pressure returned to baseline and stable Postop Assessment: no apparent nausea or vomiting Anesthetic complications: no     Last Vitals:  Vitals:   08/19/19 1200 08/19/19 1221  BP: (!) 142/94 (!) 142/96  Pulse: 96 98  Resp: 12 16  Temp:  36.9 C  SpO2: 95% 95%    Last Pain:  Vitals:   08/19/19 1221  TempSrc:   PainSc: 0-No pain                 Javen Ridings

## 2019-08-19 NOTE — Op Note (Signed)
08/19/2019  9:27 AM  PATIENT:  Kathryn Woods  69 y.o. female  PRE-OPERATIVE DIAGNOSIS:  left shoulder rotator cuff tear  POST-OPERATIVE DIAGNOSIS: Left shoulder rotator cuff tear supraspinatus  Arthroscopic findings: Partial tear intra-articular portion of biceps tendon Degenerative tear superior labrum Glenohumeral erosion grade 4 posterior glenoid Extensive bursitis subacromial space Rotator cuff tear 1 cm retracted 1-1/2 cm front to back   PROCEDURE:  Procedure(s) with comments: Arthroscopic rotator cuff repair LEFT SHOULDER (Left) - With scalene block   IMPLANT: FiberWire and Arthrex suture anchor swivel lock  SURGEON:  Surgeon(s) and Role:    Vickki Hearing, MD - Primary  PHYSICIAN ASSISTANT:   ASSISTANTS: Marisa Severin  ANESTHESIA:   general with preop scalene block EBL:  15 mL   BLOOD ADMINISTERED:none  DRAINS: none   LOCAL MEDICATIONS USED:  MARCAINE     SPECIMEN:  No Specimen  DISPOSITION OF SPECIMEN:  N/A  COUNTS:  YES  TOURNIQUET:  * No tourniquets in log *  DICTATION: .Dragon Dictation  PLAN OF CARE: Discharge to home after PACU  PATIENT DISPOSITION:  PACU - hemodynamically stable.   Delay start of Pharmacological VTE agent (>24hrs) due to surgical blood loss or risk of bleeding: not applicable  Details of procedure  The patient was given a preop scalene block by anesthesia.  She was identified with 2 appropriate markers in the left shoulder was confirmed and marked as the surgical site.  Complete chart review and image review and implant review was performed prior to surgery  The patient was taken to the operating room for general anesthesia she was then placed in the modified beachchair position with appropriate padding.  The left arm was examined under anesthesia, she had full range of motion.  After sterile prep and drape timeout was completed  Posterior portal was injected with Marcaine with epinephrine 11 blade was  used to make a skin incision and the scope was placed into the joint and diagnostic arthroscopy was completed.  A anterior portal was made lateral to the coracoid and above the subscapularis appropriate cannula was placed in the joint was inspected with a probe  Shaver was placed into the joint and the biceps tendon which was torn 20 half centimeters distal to its insertion was debrided.  The superior labrum was debrided.  The cuff tear was also debrided.  The scope was then placed into the subacromial space where a significant thickened bursal tissue was encountered and this was removed through a separate lateral portal  Shaver and 90 degree wand was used to debride the tissue until the rotator cuff tear was identified.  The tear cuff tear was 1 cm retracted with 1-1/2 cm tear front to back classic crescent tear  After debridement of the greater tuberosity with a bur and debridement of the edge of the tendon a arthroscopic tissue grasper was used to evaluate the excursion of the tendon which was excellent.  We placed a FiberWire suture inverted mattress style and then extended the lateral portal used finger dissection to split the deltoid placed to retractors and placed Arthrex anchor using a punch threading the suture through the end of the anchor and then securing the anchor to the lateral humerus.  This gave excellent reapproximation of the tendon to bone.  The wound was irrigated and closed with 0 Monocryl for the deltoid split, 0 Monocryl in the deep subcu tissue and then a running 2-0 Monocryl to close the skin.  The other 2 portals  were closed with 2-0 Monocryl.  Sterile dressing was applied  Patient was extubated and a sling was applied she was then taken to recovery room in stable condition

## 2019-08-25 DIAGNOSIS — M25511 Pain in right shoulder: Secondary | ICD-10-CM | POA: Diagnosis not present

## 2019-08-25 DIAGNOSIS — E785 Hyperlipidemia, unspecified: Secondary | ICD-10-CM | POA: Diagnosis not present

## 2019-08-25 DIAGNOSIS — Z0000 Encounter for general adult medical examination without abnormal findings: Secondary | ICD-10-CM | POA: Diagnosis not present

## 2019-08-25 DIAGNOSIS — Z9889 Other specified postprocedural states: Secondary | ICD-10-CM | POA: Insufficient documentation

## 2019-08-27 ENCOUNTER — Ambulatory Visit (INDEPENDENT_AMBULATORY_CARE_PROVIDER_SITE_OTHER): Payer: Medicare Other | Admitting: Orthopedic Surgery

## 2019-08-27 ENCOUNTER — Other Ambulatory Visit: Payer: Self-pay

## 2019-08-27 DIAGNOSIS — Z9889 Other specified postprocedural states: Secondary | ICD-10-CM

## 2019-08-27 MED ORDER — HYDROCODONE-ACETAMINOPHEN 10-325 MG PO TABS: 1.0000 | ORAL_TABLET | ORAL | 0 refills | Status: DC | PRN

## 2019-08-27 NOTE — Patient Instructions (Signed)
Start PT   Wear sling 3 more weeks

## 2019-08-27 NOTE — Progress Notes (Signed)
Chief Complaint  Patient presents with  . Follow-up    Recheck on left shoulder, DOS 08-19-19.   Meds ordered this encounter  Medications  . HYDROcodone-acetaminophen (NORCO) 10-325 MG tablet    Sig: Take 1 tablet by mouth every 4 (four) hours as needed.    Dispense:  42 tablet    Refill:  0    Status post arthroscopic repair left rotator cuff.  She had arthritis of the shoulder degenerative labral tear bursitis which was debrided  We refilled her medication today she can start physical therapy wear the sling for 3-4 more weeks  Follow-up in 3 to 4 weeks

## 2019-09-01 ENCOUNTER — Ambulatory Visit (HOSPITAL_COMMUNITY): Payer: Medicare Other | Attending: Orthopedic Surgery | Admitting: Occupational Therapy

## 2019-09-01 ENCOUNTER — Other Ambulatory Visit: Payer: Self-pay

## 2019-09-01 ENCOUNTER — Encounter (HOSPITAL_COMMUNITY): Payer: Self-pay | Admitting: Occupational Therapy

## 2019-09-01 DIAGNOSIS — R29898 Other symptoms and signs involving the musculoskeletal system: Secondary | ICD-10-CM | POA: Diagnosis present

## 2019-09-01 DIAGNOSIS — M25512 Pain in left shoulder: Secondary | ICD-10-CM | POA: Insufficient documentation

## 2019-09-01 NOTE — Therapy (Signed)
[facility] [address] [place], [place], [address] Phone: [phone]   Fax:  [phone]  Occupational Therapy Evaluation  Patient Details  Name: Kathryn Woods MRN: [id number] Date of Birth: Nov 22, 1950 Referring Provider (OT): Dr. Fuller Canada   Encounter Date: 09/01/2019  OT End of Session - 09/01/19 1019    Visit Number  1    Number of Visits  16    Date for OT Re-Evaluation  10/31/19   mini-reassessment 09/30/19   Authorization Type  [employer] Medicare; $35 copay    Progress Note Due on Visit  10    OT Start Time  0950    OT Stop Time  1015    OT Time Calculation (min)  25 min    Activity Tolerance  Patient tolerated treatment well    Behavior During Therapy  [facility] for tasks assessed/performed       Past Medical History:  Diagnosis Date  . Arthritis    lumbar region, spondylolisthesis   . Hypercholesteremia     Past Surgical History:  Procedure Laterality Date  . BACK SURGERY    . KNEE ARTHROSCOPY WITH LATERAL MENISECTOMY Right 02/25/2019   Procedure: RIGHT KNEE ARTHROSCOPY WITH LATERAL MENISECTOMY;  Surgeon: Vickki Hearing, MD;  Location: [facility];  Service: Orthopedics;  Laterality: Right;  . SHOULDER ARTHROSCOPY WITH OPEN ROTATOR CUFF REPAIR Right 10/15/2018   Procedure: SHOULDER ARTHROSCOPY WITH OPEN ROTATOR CUFF REPAIR;  Surgeon: Vickki Hearing, MD;  Location: [facility];  Service: Orthopedics;  Laterality: Right;  . SHOULDER ARTHROSCOPY WITH ROTATOR CUFF REPAIR Left 08/19/2019   Procedure: LEFT SHOULDER ARTHROSCOPY WITH ROTATOR CUFF REPAIR;  Surgeon: Vickki Hearing, MD;  Location: [facility];  Service: Orthopedics;  Laterality: Left;  With scalene block   . TUBAL LIGATION      There were no vitals filed for this visit.  Subjective Assessment - 09/01/19 1017    Subjective   S: It's been a little sore.    Pertinent History  Pt is a 69 y/o female s/p left shoulder arthroscopic RCR on 08/19/19. Pt presents  in sling, reports she has been sore but feels it is doing well. Pt was referred to occupational therapy for evaluation and treatment by Dr. Fuller Canada.    Special Tests  FOTO: 39/100    Patient Stated Goals  To be able to use my arm and have less pain.    Currently in Pain?  Yes    Pain Score  2     Pain Location  Shoulder    Pain Orientation  Left    Pain Descriptors / Indicators  Sore    Pain Type  Acute pain    Pain Radiating Towards  N/A    Pain Onset  1 to 4 weeks ago    Pain Frequency  Intermittent    Aggravating Factors   movement    Pain Relieving Factors  rest, heat, pain medication    Effect of Pain on Daily Activities  unable to use LUE during ADLs    Multiple Pain Sites  No        [facility] OT Assessment - 09/01/19 0952      Assessment   Medical Diagnosis  s/p left shoulder arthroscopic RCR    Referring Provider (OT)  Dr. Fuller Canada    Onset Date/Surgical Date  08/19/19    Hand Dominance  Right    Next MD Visit  09/16/19    Prior Therapy  None for this operation      Precautions   Precautions  Shoulder    Type of Shoulder Precautions  weeks 1-4 (5/13-6/10) P/ROM, pendulums, isometrics, scapular A/ROM; weeks 4-12 (6/10-8/5) AA/ROM and progress as tolerated. Week 12+ strengthening     Shoulder Interventions  Shoulder sling/immobilizer;Off for dressing/bathing/exercises      Restrictions   Weight Bearing Restrictions  No      Balance Screen   Has the patient fallen in the past 6 months  No    Has the patient had a decrease in activity level because of a fear of falling?   No    Is the patient reluctant to leave their home because of a fear of falling?   No      Prior Function   Level of Independence  Independent    Vocation  Retired    Leisure  nothing right now      ADL   ADL comments  Pt is having difficulty with all ADLs, dressing, bathing, is unable to reach overhead and behind back.       Written Expression   Dominant Hand  Right       Cognition   Overall Cognitive Status  Within Functional Limits for tasks assessed      Observation/Other Assessments   Focus on Therapeutic Outcomes (FOTO)   39/100      ROM / Strength   AROM / PROM / Strength  AROM;PROM;Strength      Palpation   Palpation comment  moderate fascial restrictions in left upper arm, trapezius, and scapular regions      AROM   Overall AROM   Deficits;Unable to assess;Due to precautions      PROM   Overall PROM Comments  Assessed supine, er/IR adducted    PROM Assessment Site  Shoulder    Right/Left Shoulder  Left    Left Shoulder Flexion  130 Degrees    Left Shoulder ABduction  120 Degrees    Left Shoulder Internal Rotation  90 Degrees    Left Shoulder External Rotation  69 Degrees      Strength   Overall Strength  Deficits;Unable to assess;Due to precautions                      OT Education - 09/01/19 1009    Education Details  table slides    Person(s) Educated  Patient    Methods  Explanation;Demonstration;Handout    Comprehension  Verbalized understanding;Returned demonstration       OT Short Term Goals - 09/01/19 1024      OT SHORT TERM GOAL #1   Title  Pt will be provided with and educated on HEP to improve ability to use LUE as non-dominant during ADLs.    Time  4    Period  Weeks    Status  New    Target Date  10/01/19      OT SHORT TERM GOAL #2   Title  Pt will increase LUE P/ROM to [facility] to improve ability to perform dressing tasks with minimal compensatory strategies.    Time  4    Period  Weeks    Status  New      OT SHORT TERM GOAL #3   Title  Pt will increase LUE strength to 3/5 to improve ability to reach items at waist to chest height during ADL completion.    Time  4    Period  Weeks  Status  New        OT Long Term Goals - 09/01/19 1025      OT LONG TERM GOAL #1   Title  Patient will return to highest level of independence while using her LUE for all daily and household tasks.    Time  8     Period  Weeks    Status  New    Target Date  10/31/19      OT LONG TERM GOAL #2   Title  Patient will increase LUE A/ROM to [facility] in order to reach into overhead cabinets with less difficulty.    Time  8    Period  Weeks    Status  New      OT LONG TERM GOAL #3   Title  Patient will decrease fascial restrictions in LUE to min amount or less in order to increase functional mobilty needed to complete reaching and dressing tasks.    Time  8    Period  Weeks    Status  New      OT LONG TERM GOAL #4   Title  Patient will increase her LUE strength to 4+/5 in order to return to completing her required yardwork tasks.    Time  8    Period  Weeks    Status  New      OT LONG TERM GOAL #5   Title  Patient will report a decrease in pain of approximately 3/10 or less when completing daily and household chores using her LUE.    Time  8    Period  Weeks    Status  New            Plan - 09/01/19 1022    Clinical Impression Statement  A: Pt is a 69 y/o female s/p left shoulder arthroscopic RCR on 08/19/19 presenting with deficits limiting functional use of LUE as non-dominant during ADL completion.    OT Occupational Profile and History  Problem Focused Assessment - Including review of records relating to presenting problem    Occupational performance deficits (Please refer to evaluation for details):  ADL's;IADL's;Leisure    Body Structure / Function / Physical Skills  ADL;Endurance;UE functional use;Fascial restriction;Pain;Flexibility;ROM;IADL;Strength    Rehab Potential  Good    Clinical Decision Making  Limited treatment options, no task modification necessary    Comorbidities Affecting Occupational Performance:  None    Modification or Assistance to Complete Evaluation   No modification of tasks or assist necessary to complete eval    OT Frequency  2x / week    OT Duration  8 weeks    OT Treatment/Interventions  Self-care/ADL training;Ultrasound;Patient/family education;Passive  range of motion;Cryotherapy;Electrical Stimulation;Moist Heat;Therapeutic exercise;Manual Therapy;Therapeutic activities    Plan  P: Pt will benefit from skilled OT services to decrease pain and fascial restrictions, increase joint ROM, strength, and functional use of LUE during ADLs. Treatment plan: myofascial release, manual techniques, P/ROM, AA/ROM, A/ROM, general LUE strengthening, scapular mobility/stability/strengthening, modalities prn    OT Home Exercise Plan  eval: table slides    Consulted and Agree with Plan of Care  Patient       Patient will benefit from skilled therapeutic intervention in order to improve the following deficits and impairments:   Body Structure / Function / Physical Skills: ADL, Endurance, UE functional use, Fascial restriction, Pain, Flexibility, ROM, IADL, Strength       Visit Diagnosis: Acute pain of left shoulder  Other symptoms and signs involving  the musculoskeletal system    Problem List Patient Active Problem List   Diagnosis Date Noted  . S/P arthroscopy of left shoulder RCR 08/19/19 08/25/2019  . Nontraumatic complete tear of left rotator cuff   . S/P right knee arthroscopy 02/25/2019 03/02/2019  . Complex tear of lateral meniscus of right knee as current injury   . Status post rotator cuff repair 10/15/2018 12/21/2018  . Biceps tendon rupture, proximal, right, subsequent encounter   . Primary osteoarthritis of right shoulder   . Labral tear of shoulder, degenerative, right   . Synovitis of right shoulder   . Spondylolisthesis at L3-L4 level 12/10/2016  . Bursitis, shoulder 02/24/2013  . HYPERLIPIDEMIA 02/04/2007  . WRIST SPRAIN, LEFT 02/04/2007  . MIGRAINE HEADACHE 01/15/2007  . BRONCHITIS, CHRONIC NOS 01/15/2007  . CONSTIPATION 01/15/2007  . IBS 01/15/2007  . [place] DISEASE, LUMBOSACRAL SPINE 01/15/2007  . LOW BACK PAIN, CHRONIC 01/15/2007  . MALAISE AND FATIGUE 01/15/2007   Guadelupe Sabin, OTR/L  [phone] 09/01/2019, 10:27  AM  [facility] [address] [place], [place], [address] Phone: [phone]   Fax:  [phone]  Name: Yarel Kilcrease MRN: [id number] Date of Birth: Aug 08, 1950

## 2019-09-01 NOTE — Patient Instructions (Signed)
1) SHOULDER: Flexion On Table   Place hands on towel placed on table, elbows straight. Lean forward with you upper body, pushing towel away from body.  _15__ reps per set, _3__ sets per day  2) Abduction (Passive)   With arm out to side, resting on towel placed on table with palm DOWN, keeping trunk away from table, lean to the side while pushing towel away from body.  Repeat __15__ times. Do __3__ sessions per day.  Copyright  VHI. All rights reserved.     3) Internal Rotation (Assistive)   Seated with elbow bent at right angle and held against side, slide arm on table surface in an inward arc keeping elbow anchored in place. Repeat _15___ times. Do __3__ sessions per day. Activity: Use this motion to brush crumbs off the table.  Copyright  VHI. All rights reserved.    

## 2019-09-09 ENCOUNTER — Other Ambulatory Visit: Payer: Self-pay | Admitting: Orthopedic Surgery

## 2019-09-09 DIAGNOSIS — G8918 Other acute postprocedural pain: Secondary | ICD-10-CM

## 2019-09-09 MED ORDER — HYDROCODONE-ACETAMINOPHEN 7.5-325 MG PO TABS: 1.0000 | ORAL_TABLET | Freq: Four times a day (QID) | ORAL | 0 refills | Status: DC | PRN

## 2019-09-09 NOTE — Telephone Encounter (Signed)
Patient called for refill of pain medication - states in physical therapy, and hurting: HYDROcodone-acetaminophen (NORCO) 10-325 MG tablet 42 tablet  -[facility], [place]

## 2019-09-10 ENCOUNTER — Other Ambulatory Visit: Payer: Self-pay

## 2019-09-10 ENCOUNTER — Ambulatory Visit (HOSPITAL_COMMUNITY): Payer: Medicare Other | Attending: Orthopedic Surgery | Admitting: Occupational Therapy

## 2019-09-10 ENCOUNTER — Encounter (HOSPITAL_COMMUNITY): Payer: Self-pay | Admitting: Occupational Therapy

## 2019-09-10 DIAGNOSIS — R29898 Other symptoms and signs involving the musculoskeletal system: Secondary | ICD-10-CM

## 2019-09-10 DIAGNOSIS — M25512 Pain in left shoulder: Secondary | ICD-10-CM | POA: Diagnosis present

## 2019-09-10 NOTE — Therapy (Signed)
[place], [place], [address] Phone: [phone]   Fax:  [phone]  Occupational Therapy Treatment  Patient Details  Name: Kathryn Woods MRN: [id number] Date of Birth: [birth year] Referring Provider (OT): Dr. Arther Abbott   Encounter Date: 09/10/2019  OT End of Session - 09/10/19 1017    Visit Number  2    Number of Visits  16    Date for OT Re-Evaluation  10/31/19   mini-reassessment 09/30/19   Authorization Type  [employer] Medicare; $35 copay    Progress Note Due on Visit  10    OT Start Time  0945    OT Stop Time  1020    OT Time Calculation (min)  35 min    Activity Tolerance  Patient tolerated treatment well    Behavior During Therapy  [facility] for tasks assessed/performed       Past Medical History:  Diagnosis Date  . Arthritis    lumbar region, spondylolisthesis   . Hypercholesteremia     Past Surgical History:  Procedure Laterality Date  . BACK SURGERY    . KNEE ARTHROSCOPY WITH LATERAL MENISECTOMY Right 02/25/2019   Procedure: RIGHT KNEE ARTHROSCOPY WITH LATERAL MENISECTOMY;  Surgeon: Carole Civil, MD;  Location: [facility];  Service: Orthopedics;  Laterality: Right;  . SHOULDER ARTHROSCOPY WITH OPEN ROTATOR CUFF REPAIR Right 10/15/2018   Procedure: SHOULDER ARTHROSCOPY WITH OPEN ROTATOR CUFF REPAIR;  Surgeon: Carole Civil, MD;  Location: [facility];  Service: Orthopedics;  Laterality: Right;  . SHOULDER ARTHROSCOPY WITH ROTATOR CUFF REPAIR Left 08/19/2019   Procedure: LEFT SHOULDER ARTHROSCOPY WITH ROTATOR CUFF REPAIR;  Surgeon: Carole Civil, MD;  Location: [facility];  Service: Orthopedics;  Laterality: Left;  With scalene block   . TUBAL LIGATION      There were no vitals filed for this visit.  Subjective Assessment - 09/10/19 0945    Subjective   S:It's been pretty sore the past few days.    Currently in Pain?  Yes    Pain Score  6     Pain Location  Shoulder    Pain  Orientation  Left    Pain Descriptors / Indicators  Aching;Sore    Pain Type  Acute pain    Pain Radiating Towards  N/A    Pain Onset  1 to 4 weeks ago    Pain Frequency  Intermittent    Aggravating Factors   movement    Pain Relieving Factors  ice, heat, pain medication    Effect of Pain on Daily Activities  unable to use LUE during ADLs    Multiple Pain Sites  No         [facility] OT Assessment - 09/10/19 0944      Assessment   Medical Diagnosis  s/p left shoulder arthroscopic RCR      Precautions   Precautions  Shoulder    Type of Shoulder Precautions  weeks 1-4 (5/13-6/10) P/ROM, pendulums, isometrics, scapular A/ROM; weeks 4-12 (6/10-8/5) AA/ROM and progress as tolerated. Week 12+ strengthening     Shoulder Interventions  Shoulder sling/immobilizer;Off for dressing/bathing/exercises               OT Treatments/Exercises (OP) - 09/10/19 0947      Exercises   Exercises  Shoulder      Shoulder Exercises: Supine   Protraction  PROM;10 reps    Horizontal ABduction  PROM;10 reps    External Rotation  PROM;10 reps  Internal Rotation  PROM;10 reps    Flexion  PROM;10 reps    ABduction  PROM;10 reps      Shoulder Exercises: Seated   Elevation  AROM;10 reps    Extension  AROM;10 reps    Row  AROM;10 reps    Other Seated Exercises  scapular depression, A/ROM, 10X      Shoulder Exercises: Therapy Ball   Flexion  10 reps    ABduction  10 reps      Shoulder Exercises: ROM/Strengthening   Caudal Glide  3x10"      Shoulder Exercises: Isometric Strengthening   Flexion  Supine;3X5"    Extension  Supine;3X5"    External Rotation  Supine;3X5"    Internal Rotation  Supine;3X5"    ABduction  Supine;3X5"    ADduction  Supine;3X5"      Manual Therapy   Manual Therapy  Myofascial release    Manual therapy comments  manual therapy completed separately from therapeutic exercises    Myofascial Release  myofascial release and manual techniques completed to left upper arm,  trapezius, and scapular regions to decrease pain and fascial restrictions and increase joint ROM               OT Short Term Goals - 09/10/19 1012      OT SHORT TERM GOAL #1   Title  Pt will be provided with and educated on HEP to improve ability to use LUE as non-dominant during ADLs.    Time  4    Period  Weeks    Status  On-going    Target Date  10/01/19      OT SHORT TERM GOAL #2   Title  Pt will increase LUE P/ROM to [facility] to improve ability to perform dressing tasks with minimal compensatory strategies.    Time  4    Period  Weeks    Status  On-going      OT SHORT TERM GOAL #3   Title  Pt will increase LUE strength to 3/5 to improve ability to reach items at waist to chest height during ADL completion.    Time  4    Period  Weeks    Status  On-going        OT Long Term Goals - 09/10/19 1013      OT LONG TERM GOAL #1   Title  Patient will return to highest level of independence while using her LUE for all daily and household tasks.    Time  8    Period  Weeks    Status  On-going      OT LONG TERM GOAL #2   Title  Patient will increase LUE A/ROM to [facility] in order to reach into overhead cabinets with less difficulty.    Time  8    Period  Weeks    Status  On-going      OT LONG TERM GOAL #3   Title  Patient will decrease fascial restrictions in LUE to min amount or less in order to increase functional mobilty needed to complete reaching and dressing tasks.    Time  8    Period  Weeks    Status  On-going      OT LONG TERM GOAL #4   Title  Patient will increase her LUE strength to 4+/5 in order to return to completing her required yardwork tasks.    Time  8    Period  Weeks    Status  On-going      OT LONG TERM GOAL #5   Title  Patient will report a decrease in pain of approximately 3/10 or less when completing daily and household chores using her LUE.    Time  8    Period  Weeks    Status  On-going            Plan - 09/10/19 1013    Clinical  Impression Statement  A: Initiated myofascial release to decrease fascial restrictions, pt with good vaso response to manual techniques. P/ROM completed with pt achieving ROM WNL today. Initiated isometrics, scapular A/ROM, and therapy ball stretches today. Verbal cuing for form and technique.    Body Structure / Function / Physical Skills  ADL;Endurance;UE functional use;Fascial restriction;Pain;Flexibility;ROM;IADL;Strength    Plan  P: Continue with protocol, update HEP for isometrics. Add low level thumb tacks and prot/ret/elev/dep    OT Home Exercise Plan  eval: table slides    Consulted and Agree with Plan of Care  Patient       Patient will benefit from skilled therapeutic intervention in order to improve the following deficits and impairments:   Body Structure / Function / Physical Skills: ADL, Endurance, UE functional use, Fascial restriction, Pain, Flexibility, ROM, IADL, Strength       Visit Diagnosis: Acute pain of left shoulder  Other symptoms and signs involving the musculoskeletal system    Problem List Patient Active Problem List   Diagnosis Date Noted  . S/P arthroscopy of left shoulder RCR 08/19/19 08/25/2019  . Nontraumatic complete tear of left rotator cuff   . S/P right knee arthroscopy 02/25/2019 03/02/2019  . Complex tear of lateral meniscus of right knee as current injury   . Status post rotator cuff repair 10/15/2018 12/21/2018  . Biceps tendon rupture, proximal, right, subsequent encounter   . Primary osteoarthritis of right shoulder   . Labral tear of shoulder, degenerative, right   . Synovitis of right shoulder   . Spondylolisthesis at L3-L4 level 12/10/2016  . Bursitis, shoulder 02/24/2013  . HYPERLIPIDEMIA 02/04/2007  . WRIST SPRAIN, LEFT 02/04/2007  . MIGRAINE HEADACHE 01/15/2007  . BRONCHITIS, CHRONIC NOS 01/15/2007  . CONSTIPATION 01/15/2007  . IBS 01/15/2007  . [place] DISEASE, LUMBOSACRAL SPINE 01/15/2007  . LOW BACK PAIN, CHRONIC 01/15/2007  .  MALAISE AND FATIGUE 01/15/2007   Guadelupe Sabin, OTR/L  [phone] 09/10/2019, 10:19 AM  [place] [address] [place], [place], [address] Phone: [phone]   Fax:  [phone]  Name: Kathryn Woods MRN: [id number] Date of Birth: 1950-09-05

## 2019-09-11 ENCOUNTER — Emergency Department (HOSPITAL_COMMUNITY): Payer: Medicare Other

## 2019-09-11 ENCOUNTER — Emergency Department (HOSPITAL_COMMUNITY)
Admission: EM | Admit: 2019-09-11 | Discharge: 2019-09-11 | Disposition: A | Discharge status: Home / Self Care | Payer: Medicare Other | Attending: Emergency Medicine | Admitting: Emergency Medicine

## 2019-09-11 ENCOUNTER — Other Ambulatory Visit: Payer: Self-pay

## 2019-09-11 ENCOUNTER — Encounter (HOSPITAL_COMMUNITY): Payer: Self-pay | Admitting: Emergency Medicine

## 2019-09-11 DIAGNOSIS — R079 Chest pain, unspecified: Secondary | ICD-10-CM | POA: Insufficient documentation

## 2019-09-11 DIAGNOSIS — F1210 Cannabis abuse, uncomplicated: Secondary | ICD-10-CM | POA: Diagnosis not present

## 2019-09-11 DIAGNOSIS — M79602 Pain in left arm: Secondary | ICD-10-CM | POA: Insufficient documentation

## 2019-09-11 DIAGNOSIS — R071 Chest pain on breathing: Secondary | ICD-10-CM | POA: Diagnosis not present

## 2019-09-11 DIAGNOSIS — Z79899 Other long term (current) drug therapy: Secondary | ICD-10-CM | POA: Diagnosis not present

## 2019-09-11 DIAGNOSIS — M542 Cervicalgia: Secondary | ICD-10-CM | POA: Insufficient documentation

## 2019-09-11 DIAGNOSIS — Z87891 Personal history of nicotine dependence: Secondary | ICD-10-CM | POA: Diagnosis not present

## 2019-09-11 DIAGNOSIS — R0789 Other chest pain: Secondary | ICD-10-CM | POA: Diagnosis present

## 2019-09-11 DIAGNOSIS — M25512 Pain in left shoulder: Secondary | ICD-10-CM | POA: Diagnosis not present

## 2019-09-11 LAB — CBC WITH DIFFERENTIAL/PLATELET
Abs Immature Granulocytes: 0.01 10*3/uL (ref 0.00–0.07)
Basophils Absolute: 0 10*3/uL (ref 0.0–0.1)
Basophils Relative: 1 %
Eosinophils Absolute: 0.5 10*3/uL (ref 0.0–0.5)
Eosinophils Relative: 7 %
HCT: 37.3 % (ref 36.0–46.0)
Hemoglobin: 11.9 g/dL — ABNORMAL LOW (ref 12.0–15.0)
Immature Granulocytes: 0 %
Lymphocytes Relative: 27 %
Lymphs Abs: 2.1 10*3/uL (ref 0.7–4.0)
MCH: 29.5 pg (ref 26.0–34.0)
MCHC: 31.9 g/dL (ref 30.0–36.0)
MCV: 92.6 fL (ref 80.0–100.0)
Monocytes Absolute: 0.7 10*3/uL (ref 0.1–1.0)
Monocytes Relative: 9 %
Neutro Abs: 4.5 10*3/uL (ref 1.7–7.7)
Neutrophils Relative %: 56 %
Platelets: 341 10*3/uL (ref 150–400)
RBC: 4.03 MIL/uL (ref 3.87–5.11)
RDW: 14.5 % (ref 11.5–15.5)
WBC: 8 10*3/uL (ref 4.0–10.5)
nRBC: 0 % (ref 0.0–0.2)

## 2019-09-11 LAB — BASIC METABOLIC PANEL
Anion gap: 10 (ref 5–15)
BUN: 14 mg/dL (ref 8–23)
CO2: 24 mmol/L (ref 22–32)
Calcium: 9.1 mg/dL (ref 8.9–10.3)
Chloride: 106 mmol/L (ref 98–111)
Creatinine, Ser: 0.92 mg/dL (ref 0.44–1.00)
GFR calc Af Amer: >60 mL/min (ref >60)
GFR calc non Af Amer: >60 mL/min (ref >60)
Glucose, Bld: 120 mg/dL — ABNORMAL HIGH (ref 70–99)
Potassium: 3.7 mmol/L (ref 3.5–5.1)
Sodium: 140 mmol/L (ref 135–145)

## 2019-09-11 LAB — D-DIMER, QUANTITATIVE: D-Dimer, Quant: 1.08 ug/mL-FEU — ABNORMAL HIGH (ref 0.00–0.50)

## 2019-09-11 LAB — TROPONIN I (HIGH SENSITIVITY)
Troponin I (High Sensitivity): <2 ng/L (ref <18)
Troponin I (High Sensitivity): <2 ng/L (ref <18)

## 2019-09-11 MED ORDER — IOHEXOL 350 MG/ML SOLN
100.0000 mL | Freq: Once | INTRAVENOUS | Status: AC | PRN
  Administered 2019-09-11: 100 mL via INTRAVENOUS

## 2019-09-11 MED ORDER — ACETAMINOPHEN 325 MG PO TABS
650.0000 mg | ORAL_TABLET | Freq: Once | ORAL | Status: AC
  Administered 2019-09-11: 650 mg via ORAL
  Filled 2019-09-11: qty 2

## 2019-09-11 MED ORDER — ACETAMINOPHEN ER 650 MG PO TBCR
650.0000 mg | EXTENDED_RELEASE_TABLET | Freq: Three times a day (TID) | ORAL | 0 refills | Status: DC | PRN
Start: 2019-09-11 — End: 2020-01-20

## 2019-09-11 NOTE — Discharge Instructions (Addendum)
As discussed, all of your cardiac markers were unremarkable.  Your CT scan of your chest was negative for a blood clot.  I suspect your chest pain is related to musculoskeletal pain.  I am sending home with Tylenol.  Take as prescribed and as needed for pain.  Please follow-up with your PCP within the next week for further evaluation.  Return to the ER for new or worsening symptoms.

## 2019-09-11 NOTE — ED Triage Notes (Signed)
C/o chest pain since yesterday afternoon.  Recent surgery to left shoulder (May 13 th).  Rates pain 6/10 aching, heaviness.  Pt gets physical therapy on Tuesday and Friday. Denies SOB.  C/o pain with deep breathe to upper left chest.

## 2019-09-11 NOTE — ED Provider Notes (Signed)
[facility] EMERGENCY DEPARTMENT Provider Note   CSN: [id number] Arrival date & time: 09/11/19  1237     History Chief Complaint  Patient presents with  . Chest Pain    Kathryn Woods is a 69 y.o. female with a past medical history significant for hyperlipidemia who presents to the ED due to left-sided, constant chest pressure that started yesterday when she was driving. Patient states chest pain radiates to her bilateral neck and down her left arm. Patient recently had a left shoulder surgery on 5/13 and is currently undergoing physical therapy on Tuesdays and Thursdays. Patient notes pain is worse with movement and deep inspiration. No association with physical exertion. Denies associated shortness of breath, nausea, vomiting, diaphoresis. Rates her pain a 6/10. She has been taking ibuprofen and hydrocodone for her left shoulder pain. Denies history of blood clots, recent long immobilizations, and hormonal treatments. Denies lower extremity edema. Notes she has had previous chest pain but no cardiologist. Denies tobacco use. Denies recent illness. No fever or chills.   History obtained from patient and past medical records. No interpreter used during encounter.    Past Medical History:  Diagnosis Date  . Arthritis    lumbar region, spondylolisthesis   . Hypercholesteremia     Patient Active Problem List   Diagnosis Date Noted  . S/P arthroscopy of left shoulder RCR 08/19/19 08/25/2019  . Nontraumatic complete tear of left rotator cuff   . S/P right knee arthroscopy 02/25/2019 03/02/2019  . Complex tear of lateral meniscus of right knee as current injury   . Status post rotator cuff repair 10/15/2018 12/21/2018  . Biceps tendon rupture, proximal, right, subsequent encounter   . Primary osteoarthritis of right shoulder   . Labral tear of shoulder, degenerative, right   . Synovitis of right shoulder   . Spondylolisthesis at L3-L4 level 12/10/2016  . Bursitis, shoulder  02/24/2013  . HYPERLIPIDEMIA 02/04/2007  . WRIST SPRAIN, LEFT 02/04/2007  . MIGRAINE HEADACHE 01/15/2007  . BRONCHITIS, CHRONIC NOS 01/15/2007  . CONSTIPATION 01/15/2007  . IBS 01/15/2007  . DISC DISEASE, LUMBOSACRAL SPINE 01/15/2007  . LOW BACK PAIN, CHRONIC 01/15/2007  . MALAISE AND FATIGUE 01/15/2007    Past Surgical History:  Procedure Laterality Date  . BACK SURGERY    . KNEE ARTHROSCOPY WITH LATERAL MENISECTOMY Right 02/25/2019   Procedure: RIGHT KNEE ARTHROSCOPY WITH LATERAL MENISECTOMY;  Surgeon: Vickki Hearing, MD;  Location: [facility];  Service: Orthopedics;  Laterality: Right;  . SHOULDER ARTHROSCOPY WITH OPEN ROTATOR CUFF REPAIR Right 10/15/2018   Procedure: SHOULDER ARTHROSCOPY WITH OPEN ROTATOR CUFF REPAIR;  Surgeon: Vickki Hearing, MD;  Location: [facility];  Service: Orthopedics;  Laterality: Right;  . SHOULDER ARTHROSCOPY WITH ROTATOR CUFF REPAIR Left 08/19/2019   Procedure: LEFT SHOULDER ARTHROSCOPY WITH ROTATOR CUFF REPAIR;  Surgeon: Vickki Hearing, MD;  Location: [facility];  Service: Orthopedics;  Laterality: Left;  With scalene block   . TUBAL LIGATION       OB History   No obstetric history on file.     Family History  Problem Relation Age of Onset  . Heart attack Mother   . Cancer Father        lung    Social History   Tobacco Use  . Smoking status: Former Smoker    Quit date: 12/06/1982    Years since quitting: 36.7  . Smokeless tobacco: Never Used  Substance Use Topics  . Alcohol use: No  . Drug use: Yes  Types: Marijuana    Comment: a couple times- for pain relief, pt. informed to not use anymore before surgery     Home Medications Prior to Admission medications   Medication Sig Start Date End Date Taking? Authorizing Provider  Cyanocobalamin ([employer] B12 GUMMIES PO) Take 2 tablets by mouth daily.   Yes [provider]  HYDROcodone-acetaminophen (NORCO) 7.5-325 MG tablet Take 1 tablet by mouth every 6 (six) hours as needed for  moderate pain. 09/09/19  Yes Vickki Hearing, MD  ibuprofen (ADVIL) 800 MG tablet Take 1 tablet (800 mg total) by mouth every 8 (eight) hours as needed. 08/19/19  Yes Vickki Hearing, MD  Omega-3 Fatty Acids (FISH OIL) 1000 MG CAPS Take 1 capsule by mouth daily.   Yes [provider]  promethazine (PHENERGAN) 12.5 MG tablet Take 1 tablet (12.5 mg total) by mouth every 6 (six) hours as needed for nausea or vomiting. 08/19/19  Yes Vickki Hearing, MD  rosuvastatin (CRESTOR) 5 MG tablet Take 5 mg by mouth daily.  07/16/19  Yes [provider]  acetaminophen (TYLENOL 8 HOUR) 650 MG CR tablet Take 1 tablet (650 mg total) by mouth every 8 (eight) hours as needed for pain. 09/11/19   Mannie Stabile, PA-C  HYDROcodone-acetaminophen (NORCO) 10-325 MG tablet Take 1 tablet by mouth every 4 (four) hours as needed. Patient not taking: Reported on 09/11/2019 08/27/19   Vickki Hearing, MD    Allergies    Aspirin  Review of Systems   Review of Systems  Constitutional: Negative for chills and fever.  Respiratory: Negative for shortness of breath.   Cardiovascular: Positive for chest pain. Negative for leg swelling.  Gastrointestinal: Negative for abdominal pain, diarrhea, nausea and vomiting.  Musculoskeletal: Positive for arthralgias (left shoulder pain).  All other systems reviewed and are negative.   Physical Exam Updated Vital Signs BP (!) 143/93   Pulse 81   Temp 97.9 F (36.6 C) (Oral)   Resp 18   Ht 5\' 6"  (1.676 m)   Wt 78 kg   SpO2 92%   BMI 27.76 kg/m   Physical Exam Vitals and nursing note reviewed.  Constitutional:      General: She is not in acute distress.    Appearance: She is not ill-appearing.  HENT:     Head: Normocephalic.  Eyes:     Pupils: Pupils are equal, round, and reactive to light.  Cardiovascular:     Rate and Rhythm: Normal rate and regular rhythm.     Pulses: Normal pulses.     Heart sounds: Normal heart sounds. No murmur. No  friction rub. No gallop.   Pulmonary:     Effort: Pulmonary effort is normal.     Breath sounds: Normal breath sounds.  Chest:     Comments: Reproducible left-sided anterior chest wall tenderness. No crepitus or deformity. Abdominal:     General: Abdomen is flat. Bowel sounds are normal. There is no distension.     Palpations: Abdomen is soft.     Tenderness: There is no abdominal tenderness. There is no guarding or rebound.  Musculoskeletal:     Cervical back: Neck supple.     Comments: No lower extremity edema. Negative Homans' sign bilaterally.  Skin:    General: Skin is warm and dry.  Neurological:     General: No focal deficit present.     Mental Status: She is alert.  Psychiatric:        Mood and Affect: Mood  normal.        Behavior: Behavior normal.     ED Results / Procedures / Treatments   Labs (all labs ordered are listed, but only abnormal results are displayed) Labs Reviewed  CBC WITH DIFFERENTIAL/PLATELET - Abnormal; Notable for the following components:      Result Value   Hemoglobin 11.9 (*)    All other components within normal limits  BASIC METABOLIC PANEL - Abnormal; Notable for the following components:   Glucose, Bld 120 (*)    All other components within normal limits  D-DIMER, QUANTITATIVE (NOT AT [facility]) - Abnormal; Notable for the following components:   D-Dimer, Quant 1.08 (*)    All other components within normal limits  TROPONIN I (HIGH SENSITIVITY)  TROPONIN I (HIGH SENSITIVITY)    EKG EKG Interpretation  Date/Time:  Saturday September 11 2019 13:01:21 EDT Ventricular Rate:  87 PR Interval:    QRS Duration: 87 QT Interval:  394 QTC Calculation: 474 R Axis:   22 Text Interpretation: Sinus rhythm No STEMI Confirmed by Alona Bene [phone]) on 09/11/2019 1:10:52 PM   Radiology CT Angio Chest PE W and/or Wo Contrast  Result Date: 09/11/2019 CLINICAL DATA:  Chest pain, nonspecific. Recent surgery of the LEFT shoulder. EXAM: CT ANGIOGRAPHY CHEST  WITH CONTRAST TECHNIQUE: Multidetector CT imaging of the chest was performed using the standard protocol during bolus administration of intravenous contrast. Multiplanar CT image reconstructions and MIPs were obtained to evaluate the vascular anatomy. CONTRAST:  OMNIPAQUE IOHEXOL 350 MG/ML SOLN COMPARISON:  09/11/2019 chest x-ray FINDINGS: Cardiovascular: Heart size is normal. No pericardial effusion. There is minimal atherosclerotic calcification of the thoracic aorta not associated with aneurysm. The pulmonary arteries are well opacified by contrast bolus. There is no acute pulmonary embolus. Mediastinum/Nodes: The visualized portion of the thyroid gland has a normal appearance. Esophagus is normal in appearance. No significant mediastinal, hilar, or axillary adenopathy. Lungs/Pleura: Lungs are clear. No pleural effusion or pneumothorax. Upper Abdomen: No acute abnormality. Musculoskeletal: No chest wall abnormality. No acute or significant osseous findings. Review of the MIP images confirms the above findings. IMPRESSION: 1. Technically adequate exam showing no acute pulmonary embolus. 2. Aortic Atherosclerosis (ICD10-I70.0). Electronically Signed   By: Norva Pavlov M.D.   On: 09/11/2019 17:11   DG Chest Portable 1 View  Result Date: 09/11/2019 CLINICAL DATA:  Chest pain since yesterday afternoon. Recent LEFT shoulder surgery. EXAM: PORTABLE CHEST 1 VIEW COMPARISON:  05/06/2010 FINDINGS: Heart size is normal. There is minimal subsegmental atelectasis at the RIGHT lung base. No focal consolidations or pleural effusions. No pulmonary edema. IMPRESSION: Minimal RIGHT basilar atelectasis.  Otherwise, negative. Electronically Signed   By: Norva Pavlov M.D.   On: 09/11/2019 14:00    Procedures Procedures (including critical care time)  Medications Ordered in ED Medications  acetaminophen (TYLENOL) tablet 650 mg (650 mg Oral Given 09/11/19 1636)  iohexol (OMNIPAQUE) 350 MG/ML injection 100 mL  (100 mLs Intravenous Contrast Given 09/11/19 1644)    ED Course  I have reviewed the triage vital signs and the nursing notes.  Pertinent labs & imaging results that were available during my care of the patient were reviewed by me and considered in my medical decision making (see chart for details).  Clinical Course as of Sep 10 1748  Sat Sep 11, 2019  1632 D-Dimer, Quant(!): 1.08 [CA]    Clinical Course User Index [CA] Jesusita Oka   MDM Rules/Calculators/A&P HEAR Score: 4  69 year old female presents to the ED due to left-sided chest pain that has been constant since yesterday. Patient recently had a shoulder operation on 5/13. Pain is worse with movement. No history of MI or stroke. Upon arrival, patient is afebrile, not tachycardic or hypoxic. BP elevated at 150/102 which appears to be chronically elevated in that range. Patient in no acute distress and nontoxic-appearing. Physical exam significant for  Reproducible left-sided anterior chest wall tenderness. No lower extremity edema. Negative Homans' sign bilaterally. Low suspicion for PE/DVT. Suspect chest pain related to MSK etiology given reproducible tenderness on exam. Will obtain routine labs and troponin to rule out cardiac etiology. D-dimer added given patient's recent surgery and chest pain.   CBC reassuring with no leukocytosis.  BMP reassuring with normal renal function no major electrolyte derangements.  Initial troponin normal.  EKG personally reviewed which demonstrates normal sinus rhythm with no signs of acute ischemia.  Chest x-ray personally reviewed which demonstrates: IMPRESSION:  Minimal RIGHT basilar atelectasis. Otherwise, negative.   D-dimer elevated. CTA ordered to rule out PE.  CTA personally reviewed which demonstrates: IMPRESSION:  1. Technically adequate exam showing no acute pulmonary embolus.  2. Aortic Atherosclerosis (ICD10-I70.0).   Delta troponin flat. Doubt ACS with  normal troponin and EKG. Suspect MSK etiology given reproducible nature on exam. Will discharge patient with tylenol as needed for pain. Advised patient to follow-up with PCP within the next week for further evaluation. Strict ED precautions discussed with patient. Patient states understanding and agrees to plan. Patient discharged home in no acute distress and stable vitals  Discussed case with Dr. Laverta Baltimore who evaluated patient at bedside and agrees with assessment and plan.  Final Clinical Impression(s) / ED Diagnoses Final diagnoses:  Nonspecific chest pain    Rx / DC Orders ED Discharge Orders         Ordered    acetaminophen (TYLENOL 8 HOUR) 650 MG CR tablet  Every 8 hours PRN     09/11/19 1717           Suzy Bouchard, PA-C 09/11/19 1752    Margette Fast, MD 09/12/19 [phone]

## 2019-09-16 ENCOUNTER — Ambulatory Visit (INDEPENDENT_AMBULATORY_CARE_PROVIDER_SITE_OTHER): Payer: Medicare Other | Admitting: Orthopedic Surgery

## 2019-09-16 ENCOUNTER — Encounter: Payer: Self-pay | Admitting: Orthopedic Surgery

## 2019-09-16 ENCOUNTER — Ambulatory Visit (HOSPITAL_COMMUNITY): Payer: Medicare Other | Admitting: Occupational Therapy

## 2019-09-16 ENCOUNTER — Other Ambulatory Visit: Payer: Self-pay

## 2019-09-16 ENCOUNTER — Telehealth (HOSPITAL_COMMUNITY): Payer: Self-pay | Admitting: Occupational Therapy

## 2019-09-16 VITALS — Ht 66.0 in | Wt 174.0 lb

## 2019-09-16 DIAGNOSIS — Z9889 Other specified postprocedural states: Secondary | ICD-10-CM

## 2019-09-16 DIAGNOSIS — G8918 Other acute postprocedural pain: Secondary | ICD-10-CM

## 2019-09-16 MED ORDER — HYDROCODONE-ACETAMINOPHEN 7.5-325 MG PO TABS: 1.0000 | ORAL_TABLET | Freq: Three times a day (TID) | ORAL | 0 refills | Status: DC | PRN

## 2019-09-16 NOTE — Telephone Encounter (Signed)
pt has been up all night with stomach virus and had to cancel for today

## 2019-09-16 NOTE — Patient Instructions (Addendum)
Use Aspercreme, Biofreeze or Voltaren gel over the counter 2-3 times daily make sure you rub it in well each time you use it. Use it on the right biceps area, and use heat to the area also (before you use the muscle cream not after)  Use neosporin on the stitch area in front of left shoulder

## 2019-09-16 NOTE — Progress Notes (Signed)
Chief Complaint  Patient presents with  . Routine Post Op    Lt RCR DOS 08/19/19  . Medication Refill    Hydrocodone   4 weeks post op 08/19/2019  Kathryn Woods advancing well in therapy she can remove her sling no heavy lifting continue therapy medication refill  She is having some stitch knots that we had to take out 1 posteriorly 1 anteriorly  She also has some biceps pain from the shoulder surgery she had last year, this is notable postop issue with biceps tendon issues some fatigue and pain reaching across her chest she did have some AC joint arthritis but the pain is in the biceps tendon area and biceps muscle area   Encounter Diagnoses  Name Primary?  . S/P arthroscopy of left shoulder RCR 08/19/19 Yes  . Post-op pain   . Status post rotator cuff repair 10/15/2018//right shoulder      PRE-OPERATIVE DIAGNOSIS:  left shoulder rotator cuff tear  POST-OPERATIVE DIAGNOSIS: Left shoulder rotator cuff tear supraspinatus  Arthroscopic findings: Partial tear intra-articular portion of biceps tendon Degenerative tear superior labrum Glenohumeral erosion grade 4 posterior glenoid Extensive bursitis subacromial space Rotator cuff tear 1 cm retracted 1-1/2 cm front to back   PROCEDURE:  Procedure(s) with comments: Arthroscopic rotator cuff repair LEFT SHOULDER (Left) - With scalene block   IMPLANT: FiberWire and Arthrex suture anchor swivel lock  Meds ordered this encounter  Medications  . HYDROcodone-acetaminophen (NORCO) 7.5-325 MG tablet    Sig: Take 1 tablet by mouth every 8 (eight) hours as needed for up to 7 days for moderate pain or severe pain.    Dispense:  21 tablet    Refill:  0

## 2019-09-17 ENCOUNTER — Ambulatory Visit (HOSPITAL_COMMUNITY): Payer: Medicare Other | Admitting: Occupational Therapy

## 2019-09-17 DIAGNOSIS — M25512 Pain in left shoulder: Secondary | ICD-10-CM

## 2019-09-17 DIAGNOSIS — R29898 Other symptoms and signs involving the musculoskeletal system: Secondary | ICD-10-CM

## 2019-09-17 NOTE — Therapy (Signed)
[place], [place], [address] Phone: [phone]   Fax:  [phone]  Occupational Therapy Treatment  Patient Details  Name: Kathryn Woods MRN: [id number] Date of Birth: Aug 04, 1950 Referring Provider (OT): Dr. Arther Abbott   Encounter Date: 09/17/2019   OT End of Session - 09/17/19 1236    Visit Number 3    Number of Visits 16    Date for OT Re-Evaluation 10/31/19    Authorization Type [employer] Medicare; $35 copay    Progress Note Due on Visit 10    OT Start Time 0950    OT Stop Time 1028    OT Time Calculation (min) 38 min    Activity Tolerance Patient tolerated treatment well    Behavior During Therapy [facility] for tasks assessed/performed           Past Medical History:  Diagnosis Date  . Arthritis    lumbar region, spondylolisthesis   . Hypercholesteremia     Past Surgical History:  Procedure Laterality Date  . BACK SURGERY    . KNEE ARTHROSCOPY WITH LATERAL MENISECTOMY Right 02/25/2019   Procedure: RIGHT KNEE ARTHROSCOPY WITH LATERAL MENISECTOMY;  Surgeon: Carole Civil, MD;  Location: [facility];  Service: Orthopedics;  Laterality: Right;  . SHOULDER ARTHROSCOPY WITH OPEN ROTATOR CUFF REPAIR Right 10/15/2018   Procedure: SHOULDER ARTHROSCOPY WITH OPEN ROTATOR CUFF REPAIR;  Surgeon: Carole Civil, MD;  Location: [facility];  Service: Orthopedics;  Laterality: Right;  . SHOULDER ARTHROSCOPY WITH ROTATOR CUFF REPAIR Left 08/19/2019   Procedure: LEFT SHOULDER ARTHROSCOPY WITH ROTATOR CUFF REPAIR;  Surgeon: Carole Civil, MD;  Location: [facility];  Service: Orthopedics;  Laterality: Left;  With scalene block   . TUBAL LIGATION      There were no vitals filed for this visit.   Subjective Assessment - 09/17/19 0954    Subjective  S: It is just sore, sore, sore.    Pertinent History Pt is a 69 y/o female s/p left shoulder arthroscopic RCR on 08/19/19. Pt presents in sling, reports she has been  sore but feels it is doing well. Pt was referred to occupational therapy for evaluation and treatment by Dr. Arther Abbott.    Special Tests FOTO: 39/100    Currently in Pain? Yes    Pain Score 6     Pain Location Shoulder    Pain Orientation Left    Pain Descriptors / Indicators Aching;Sore    Pain Type Acute pain              [facility] OT Assessment - 09/17/19 0001      Precautions   Precautions Shoulder    Type of Shoulder Precautions weeks 1-4 (5/13-6/10) P/ROM, pendulums, isometrics, scapular A/ROM; weeks 4-12 (6/10-8/5) AA/ROM and progress as tolerated. Week 12+ strengthening     Shoulder Interventions Shoulder sling/immobilizer;Off for dressing/bathing/exercises                    OT Treatments/Exercises (OP) - 09/17/19 1004      Exercises   Exercises Shoulder      Shoulder Exercises: Supine   Protraction PROM;5 reps;AAROM;12 reps    Horizontal ABduction PROM;5 reps    External Rotation PROM;5 reps;AAROM;10 reps    Internal Rotation PROM;5 reps;AAROM;10 reps    Flexion PROM;5 reps;AAROM;10 reps    ABduction PROM;5 reps;AAROM;10 reps      Shoulder Exercises: Seated   Elevation AROM;10 reps    Extension --  Row AROM;10 reps    Other Seated Exercises scapular depression, A/ROM, 10X      Shoulder Exercises: Therapy Ball   Flexion 10 reps    ABduction 10 reps      Shoulder Exercises: ROM/Strengthening   Wall Wash 10x flexion; 10x abduction    Thumb Tacks 10x; standing; low level      Shoulder Exercises: Isometric Strengthening   Flexion Supine;3X5"    Extension Supine;3X5"    External Rotation Supine;3X5"    Internal Rotation Supine;3X5"    ABduction Supine;3X5"    ADduction Supine;3X5"      Manual Therapy   Manual Therapy Myofascial release    Manual therapy comments manual therapy completed separately from therapeutic exercises    Myofascial Release myofascial release and manual techniques completed to left upper arm, trapezius, and scapular  regions to decrease pain and fascial restrictions and increase joint ROM                  OT Education - 09/17/19 1235    Education Details Provided education and handout on isometric exercises.    Person(s) Educated Patient    Methods Explanation;Demonstration;Handout    Comprehension Verbalized understanding;Returned demonstration            OT Short Term Goals - 09/10/19 1012      OT SHORT TERM GOAL #1   Title Pt will be provided with and educated on HEP to improve ability to use LUE as non-dominant during ADLs.    Time 4    Period Weeks    Status On-going    Target Date 10/01/19      OT SHORT TERM GOAL #2   Title Pt will increase LUE P/ROM to [facility] to improve ability to perform dressing tasks with minimal compensatory strategies.    Time 4    Period Weeks    Status On-going      OT SHORT TERM GOAL #3   Title Pt will increase LUE strength to 3/5 to improve ability to reach items at waist to chest height during ADL completion.    Time 4    Period Weeks    Status On-going             OT Long Term Goals - 09/10/19 1013      OT LONG TERM GOAL #1   Title Patient will return to highest level of independence while using her LUE for all daily and household tasks.    Time 8    Period Weeks    Status On-going      OT LONG TERM GOAL #2   Title Patient will increase LUE A/ROM to [facility] in order to reach into overhead cabinets with less difficulty.    Time 8    Period Weeks    Status On-going      OT LONG TERM GOAL #3   Title Patient will decrease fascial restrictions in LUE to min amount or less in order to increase functional mobilty needed to complete reaching and dressing tasks.    Time 8    Period Weeks    Status On-going      OT LONG TERM GOAL #4   Title Patient will increase her LUE strength to 4+/5 in order to return to completing her required yardwork tasks.    Time 8    Period Weeks    Status On-going      OT LONG TERM GOAL #5   Title Patient will  report a decrease  in pain of approximately 3/10 or less when completing daily and household chores using her LUE.    Time 8    Period Weeks    Status On-going                 Plan - 09/17/19 1237    Clinical Impression Statement A: Initiated myofascial release to decrease fascial restrictions. P/ROM completed with pt achieving ROM WNL today. Continued isometrics, scapular A/ROM, and therapy ball stretches. Intiated AAROM with dowel rod, thumb tack exercises and wall stretch. Verbal cuing for form and technique.    Body Structure / Function / Physical Skills ADL;Endurance;UE functional use;Fascial restriction;Pain;Flexibility;ROM;IADL;Strength    Plan P: Continue with protocal. Focus on PROM and progress with AAROM.    OT Home Exercise Plan eval: table slides. 6/11 isometric exercises.    Consulted and Agree with Plan of Care Patient           Patient will benefit from skilled therapeutic intervention in order to improve the following deficits and impairments:   Body Structure / Function / Physical Skills: ADL, Endurance, UE functional use, Fascial restriction, Pain, Flexibility, ROM, IADL, Strength       Visit Diagnosis: Acute pain of left shoulder  Other symptoms and signs involving the musculoskeletal system    Problem List Patient Active Problem List   Diagnosis Date Noted  . S/P arthroscopy of left shoulder RCR 08/19/19 08/25/2019  . Nontraumatic complete tear of left rotator cuff   . S/P right knee arthroscopy 02/25/2019 03/02/2019  . Complex tear of lateral meniscus of right knee as current injury   . Status post rotator cuff repair 10/15/2018 12/21/2018  . Biceps tendon rupture, proximal, right, subsequent encounter   . Primary osteoarthritis of right shoulder   . Labral tear of shoulder, degenerative, right   . Synovitis of right shoulder   . Spondylolisthesis at L3-L4 level 12/10/2016  . Bursitis, shoulder 02/24/2013  . HYPERLIPIDEMIA 02/04/2007  . WRIST  SPRAIN, LEFT 02/04/2007  . MIGRAINE HEADACHE 01/15/2007  . BRONCHITIS, CHRONIC NOS 01/15/2007  . CONSTIPATION 01/15/2007  . IBS 01/15/2007  . DISC DISEASE, LUMBOSACRAL SPINE 01/15/2007  . LOW BACK PAIN, CHRONIC 01/15/2007  . MALAISE AND FATIGUE 01/15/2007    Theodoro Grist Yazleemar Strassner 09/17/2019, 12:43 PM  [facility] [address] [place], [place], [address] Phone: [phone]   Fax:  [phone]  Name: Kathryn Woods MRN: [id number] Date of Birth: Jan 03, 1951

## 2019-09-17 NOTE — Patient Instructions (Signed)
Strengthening: Isometric Flexion  Using wall for resistance, press right fist into ball using light pressure. Hold ____ seconds. Repeat ____ times per set. Do ____ sets per session. Do ____ sessions per day.  SHOULDER: Abduction (Isometric)  Use wall as resistance. Press arm against pillow. Keep elbow straight. Hold ___ seconds. ___ reps per set, ___ sets per day, ___ days per week  Extension (Isometric)  Place left bent elbow and back of arm against wall. Press elbow against wall. Hold ____ seconds. Repeat ____ times. Do ____ sessions per day.  Internal Rotation (Isometric)  Place palm of right fist against door frame, with elbow bent. Press fist against door frame. Hold ____ seconds. Repeat ____ times. Do ____ sessions per day.  External Rotation (Isometric)  Place back of left fist against door frame, with elbow bent. Press fist against door frame. Hold ____ seconds. Repeat ____ times. Do ____ sessions per day.  Copyright  VHI. All rights reserved.    

## 2019-09-20 ENCOUNTER — Ambulatory Visit (HOSPITAL_COMMUNITY): Payer: Medicare Other | Admitting: Occupational Therapy

## 2019-09-20 ENCOUNTER — Other Ambulatory Visit: Payer: Self-pay

## 2019-09-20 ENCOUNTER — Encounter (HOSPITAL_COMMUNITY): Payer: Self-pay | Admitting: Occupational Therapy

## 2019-09-20 DIAGNOSIS — M25512 Pain in left shoulder: Secondary | ICD-10-CM

## 2019-09-20 DIAGNOSIS — R29898 Other symptoms and signs involving the musculoskeletal system: Secondary | ICD-10-CM

## 2019-09-20 NOTE — Therapy (Signed)
[facility] [address] [place], [place], [address] Phone: [phone]   Fax:  [phone]  Occupational Therapy Treatment  Patient Details  Name: Kathryn Woods MRN: [id number] Date of Birth: 20-Dec-1950 Referring Provider (OT): Dr. Fuller Canada   Encounter Date: 09/20/2019   OT End of Session - 09/20/19 1232    Visit Number 4    Number of Visits 16    Date for OT Re-Evaluation 10/31/19    Authorization Type [employer] Medicare; $35 copay    Progress Note Due on Visit 10    OT Start Time 1115    OT Stop Time 1154    OT Time Calculation (min) 39 min    Activity Tolerance Patient tolerated treatment well    Behavior During Therapy [facility] for tasks assessed/performed           Past Medical History:  Diagnosis Date  . Arthritis    lumbar region, spondylolisthesis   . Hypercholesteremia     Past Surgical History:  Procedure Laterality Date  . BACK SURGERY    . KNEE ARTHROSCOPY WITH LATERAL MENISECTOMY Right 02/25/2019   Procedure: RIGHT KNEE ARTHROSCOPY WITH LATERAL MENISECTOMY;  Surgeon: Vickki Hearing, MD;  Location: [facility];  Service: Orthopedics;  Laterality: Right;  . SHOULDER ARTHROSCOPY WITH OPEN ROTATOR CUFF REPAIR Right 10/15/2018   Procedure: SHOULDER ARTHROSCOPY WITH OPEN ROTATOR CUFF REPAIR;  Surgeon: Vickki Hearing, MD;  Location: [facility];  Service: Orthopedics;  Laterality: Right;  . SHOULDER ARTHROSCOPY WITH ROTATOR CUFF REPAIR Left 08/19/2019   Procedure: LEFT SHOULDER ARTHROSCOPY WITH ROTATOR CUFF REPAIR;  Surgeon: Vickki Hearing, MD;  Location: [facility];  Service: Orthopedics;  Laterality: Left;  With scalene block   . TUBAL LIGATION      There were no vitals filed for this visit.   Subjective Assessment - 09/20/19 1117    Subjective  S: We had a BBQ this weekend, and it was really great.    Pertinent History Pt is a 69 y/o female s/p left shoulder arthroscopic RCR on 08/19/19. Pt presents in  sling, reports she has been sore but feels it is doing well. Pt was referred to occupational therapy for evaluation and treatment by Dr. Fuller Canada.    Currently in Pain? Yes    Pain Score 4     Pain Location Shoulder    Pain Orientation Left    Pain Descriptors / Indicators Aching;Sore    Pain Type Acute pain                        OT Treatments/Exercises (OP) - 09/20/19 1118      Exercises   Exercises Shoulder      Shoulder Exercises: Supine   Protraction PROM;5 reps;AAROM;15 reps    Horizontal ABduction PROM;5 reps    External Rotation PROM;5 reps;AAROM;15 reps    Internal Rotation PROM;5 reps;AAROM;15 reps    Flexion PROM;5 reps;AAROM;15 reps    ABduction PROM;5 reps;AAROM;15 reps      Shoulder Exercises: Seated   Elevation AROM;10 reps    Row AROM;10 reps    Other Seated Exercises scapular depression, A/ROM, 10X      Shoulder Exercises: Therapy Ball   Flexion 10 reps    ABduction --    Other Therapy Ball Exercises AAROM forward flexion, chest press, horizontal abudction, and circles.      Shoulder Exercises: ROM/Strengthening   Wall Wash 10x flexion; 10x abduction  Thumb Tacks 10x; standing; shoulder level    Proximal Shoulder Strengthening, Supine 30 sec each      Shoulder Exercises: Isometric Strengthening   Flexion 3X5"    Extension 3X5"    External Rotation 3X5"    Internal Rotation 3X5"    ABduction 3X5"    ADduction 3X5"      Modalities   Modalities Moist Heat      Moist Heat Therapy   Number Minutes Moist Heat 5 Minutes    Moist Heat Location Shoulder      Manual Therapy   Manual Therapy Myofascial release    Manual therapy comments manual therapy completed separately from therapeutic exercises    Myofascial Release myofascial release and manual techniques completed to left upper arm, trapezius, and scapular regions to decrease pain and fascial restrictions and increase joint ROM                  OT Education -  09/20/19 1201    Education Details Re-educated and provided handout on isometric exercises.    Person(s) Educated Patient    Methods Explanation;Demonstration;Handout    Comprehension Verbalized understanding;Returned demonstration            OT Short Term Goals - 09/10/19 1012      OT SHORT TERM GOAL #1   Title Pt will be provided with and educated on HEP to improve ability to use LUE as non-dominant during ADLs.    Time 4    Period Weeks    Status On-going    Target Date 10/01/19      OT SHORT TERM GOAL #2   Title Pt will increase LUE P/ROM to [facility] to improve ability to perform dressing tasks with minimal compensatory strategies.    Time 4    Period Weeks    Status On-going      OT SHORT TERM GOAL #3   Title Pt will increase LUE strength to 3/5 to improve ability to reach items at waist to chest height during ADL completion.    Time 4    Period Weeks    Status On-going             OT Long Term Goals - 09/10/19 1013      OT LONG TERM GOAL #1   Title Patient will return to highest level of independence while using her LUE for all daily and household tasks.    Time 8    Period Weeks    Status On-going      OT LONG TERM GOAL #2   Title Patient will increase LUE A/ROM to [facility] in order to reach into overhead cabinets with less difficulty.    Time 8    Period Weeks    Status On-going      OT LONG TERM GOAL #3   Title Patient will decrease fascial restrictions in LUE to min amount or less in order to increase functional mobilty needed to complete reaching and dressing tasks.    Time 8    Period Weeks    Status On-going      OT LONG TERM GOAL #4   Title Patient will increase her LUE strength to 4+/5 in order to return to completing her required yardwork tasks.    Time 8    Period Weeks    Status On-going      OT LONG TERM GOAL #5   Title Patient will report a decrease in pain of approximately 3/10 or less when completing  daily and household chores using her LUE.     Time 8    Period Weeks    Status On-going                 Plan - 09/20/19 1243    Clinical Impression Statement A: Started with myofascial release and PROM. Continued AAROM exervises using dowel rod in supine and therapy ball while seated. Reviewed handout for isometric exercises in standing. Introduced proximal strengthening in supine. Providing verbalize cues throughout for form and positioning.    Body Structure / Function / Physical Skills ADL;Endurance;UE functional use;Fascial restriction;Pain;Flexibility;ROM;IADL;Strength    Plan P: Continue protocal. Focus on PROM and AAROM.           Patient will benefit from skilled therapeutic intervention in order to improve the following deficits and impairments:   Body Structure / Function / Physical Skills: ADL, Endurance, UE functional use, Fascial restriction, Pain, Flexibility, ROM, IADL, Strength       Visit Diagnosis: Acute pain of left shoulder  Other symptoms and signs involving the musculoskeletal system    Problem List Patient Active Problem List   Diagnosis Date Noted  . S/P arthroscopy of left shoulder RCR 08/19/19 08/25/2019  . Nontraumatic complete tear of left rotator cuff   . S/P right knee arthroscopy 02/25/2019 03/02/2019  . Complex tear of lateral meniscus of right knee as current injury   . Status post rotator cuff repair 10/15/2018 12/21/2018  . Biceps tendon rupture, proximal, right, subsequent encounter   . Primary osteoarthritis of right shoulder   . Labral tear of shoulder, degenerative, right   . Synovitis of right shoulder   . Spondylolisthesis at L3-L4 level 12/10/2016  . Bursitis, shoulder 02/24/2013  . HYPERLIPIDEMIA 02/04/2007  . WRIST SPRAIN, LEFT 02/04/2007  . MIGRAINE HEADACHE 01/15/2007  . BRONCHITIS, CHRONIC NOS 01/15/2007  . CONSTIPATION 01/15/2007  . IBS 01/15/2007  . [place] DISEASE, LUMBOSACRAL SPINE 01/15/2007  . LOW BACK PAIN, CHRONIC 01/15/2007  . MALAISE AND FATIGUE  01/15/2007    Neal Dy, MSOT, OTR/L 09/20/2019, 12:47 PM  [facility] [address] [place], [place], [address] Phone: [phone]   Fax:  [phone]  Name: Shiela Bruns MRN: [id number] Date of Birth: 04-Jan-1951

## 2019-09-20 NOTE — Patient Instructions (Signed)
Strengthening: Isometric Flexion  Using wall for resistance, press right fist into ball using light pressure. Hold ____ seconds. Repeat __5__ times per set. Do __1__ sets per session. Do __3__ sessions per day.  SHOULDER: Abduction (Isometric)  Use wall as resistance. Press arm against pillow. Keep elbow straight. Hold ___ seconds. ___ reps per set, ___ sets per day, ___ days per week  Extension (Isometric)  Place left bent elbow and back of arm against wall. Press elbow against wall. Hold ____ seconds. Repeat ____ times. Do ____ sessions per day.  Internal Rotation (Isometric)  Place palm of right fist against door frame, with elbow bent. Press fist against door frame. Hold ____ seconds. Repeat ____ times. Do ____ sessions per day.  External Rotation (Isometric)  Place back of left fist against door frame, with elbow bent. Press fist against door frame. Hold ____ seconds. Repeat ____ times. Do ____ sessions per day.  Copyright  VHI. All rights reserved.

## 2019-09-23 ENCOUNTER — Encounter (HOSPITAL_COMMUNITY): Payer: Self-pay | Admitting: Occupational Therapy

## 2019-09-23 ENCOUNTER — Other Ambulatory Visit: Payer: Self-pay

## 2019-09-23 ENCOUNTER — Other Ambulatory Visit: Payer: Self-pay | Admitting: Orthopedic Surgery

## 2019-09-23 ENCOUNTER — Ambulatory Visit (HOSPITAL_COMMUNITY): Payer: Medicare Other | Admitting: Occupational Therapy

## 2019-09-23 DIAGNOSIS — G8918 Other acute postprocedural pain: Secondary | ICD-10-CM

## 2019-09-23 DIAGNOSIS — R29898 Other symptoms and signs involving the musculoskeletal system: Secondary | ICD-10-CM

## 2019-09-23 DIAGNOSIS — M25512 Pain in left shoulder: Secondary | ICD-10-CM

## 2019-09-23 MED ORDER — HYDROCODONE-ACETAMINOPHEN 7.5-325 MG PO TABS: 1.0000 | ORAL_TABLET | Freq: Three times a day (TID) | ORAL | 0 refills | Status: DC | PRN

## 2019-09-23 NOTE — Therapy (Signed)
[facility] [place], [place], [address] Phone: [phone]   Fax:  [phone]  Occupational Therapy Treatment  Patient Details  Name: Kathryn Woods MRN: [id number] Date of Birth: 03-03-1951 Referring Provider (OT): Dr. Arther Abbott   Encounter Date: 09/23/2019   OT End of Session - 09/23/19 1253    Visit Number 5    Number of Visits 16    Date for OT Re-Evaluation 10/31/19    Authorization Type [employer] Medicare; $35 copay    Progress Note Due on Visit 10    OT Start Time 1118    OT Stop Time 1156    OT Time Calculation (min) 38 min    Activity Tolerance Patient tolerated treatment well    Behavior During Therapy [facility] for tasks assessed/performed           Past Medical History:  Diagnosis Date  . Arthritis    lumbar region, spondylolisthesis   . Hypercholesteremia     Past Surgical History:  Procedure Laterality Date  . BACK SURGERY    . KNEE ARTHROSCOPY WITH LATERAL MENISECTOMY Right 02/25/2019   Procedure: RIGHT KNEE ARTHROSCOPY WITH LATERAL MENISECTOMY;  Surgeon: Carole Civil, MD;  Location: [facility];  Service: Orthopedics;  Laterality: Right;  . SHOULDER ARTHROSCOPY WITH OPEN ROTATOR CUFF REPAIR Right 10/15/2018   Procedure: SHOULDER ARTHROSCOPY WITH OPEN ROTATOR CUFF REPAIR;  Surgeon: Carole Civil, MD;  Location: [facility];  Service: Orthopedics;  Laterality: Right;  . SHOULDER ARTHROSCOPY WITH ROTATOR CUFF REPAIR Left 08/19/2019   Procedure: LEFT SHOULDER ARTHROSCOPY WITH ROTATOR CUFF REPAIR;  Surgeon: Carole Civil, MD;  Location: [facility];  Service: Orthopedics;  Laterality: Left;  With scalene block   . TUBAL LIGATION      There were no vitals filed for this visit.   Subjective Assessment - 09/23/19 1120    Subjective  S "I am just going ot be resting this weekend."    Pertinent History Pt is a 69 y/o female s/p left shoulder arthroscopic RCR on 08/19/19. Pt presents in sling,  reports she has been sore but feels it is doing well. Pt was referred to occupational therapy for evaluation and treatment by Dr. Arther Abbott.    Currently in Pain? Yes    Pain Score 5     Pain Location Shoulder    Pain Orientation Left              [facility] OT Assessment - 09/23/19 0001      Assessment   Medical Diagnosis s/p left shoulder arthroscopic RCR    Referring Provider (OT) Dr. Arther Abbott      Precautions   Precautions Shoulder    Type of Shoulder Precautions weeks 1-4 (5/13-6/10) P/ROM, pendulums, isometrics, scapular A/ROM; weeks 4-12 (6/10-8/5) AA/ROM and progress as tolerated. Week 12+ strengthening     Shoulder Interventions Shoulder sling/immobilizer;Off for dressing/bathing/exercises                    OT Treatments/Exercises (OP) - 09/23/19 1130      Exercises   Exercises Shoulder      Shoulder Exercises: Supine   Protraction PROM;5 reps;AAROM;15 reps    Horizontal ABduction PROM;5 reps    External Rotation PROM;5 reps;AAROM;15 reps    Internal Rotation PROM;5 reps;AAROM;15 reps    Flexion PROM;5 reps;AAROM;15 reps    ABduction PROM;5 reps;AAROM;15 reps      Shoulder Exercises: Seated   Elevation AROM;10 reps  Row AROM;10 reps    Other Seated Exercises Shoulder rolls 10x; forwards and backwards      Shoulder Exercises: ROM/Strengthening   UBE (Upper Arm Bike) 2' forward. 2' backwards. pace 5    Wall Wash 10x flexion; 10x abduction    Thumb Tacks standing; 10x hip level. 10x shoulder level.     "W" Arms 10X      Shoulder Exercises: Isometric Strengthening   Flexion Other (comment)   3X10   Extension Other (comment)   3X10   External Rotation Other (comment)   3X10   Internal Rotation Other (comment)   3X10   ABduction Other (comment)   3X10   ADduction Other (comment)   3X10     Manual Therapy   Manual Therapy Myofascial release    Manual therapy comments manual therapy completed separately from therapeutic exercises     Myofascial Release myofascial release and manual techniques completed to left upper arm, trapezius, and scapular regions to decrease pain and fascial restrictions and increase joint ROM                    OT Short Term Goals - 09/10/19 1012      OT SHORT TERM GOAL #1   Title Pt will be provided with and educated on HEP to improve ability to use LUE as non-dominant during ADLs.    Time 4    Period Weeks    Status On-going    Target Date 10/01/19      OT SHORT TERM GOAL #2   Title Pt will increase LUE P/ROM to [facility] to improve ability to perform dressing tasks with minimal compensatory strategies.    Time 4    Period Weeks    Status On-going      OT SHORT TERM GOAL #3   Title Pt will increase LUE strength to 3/5 to improve ability to reach items at waist to chest height during ADL completion.    Time 4    Period Weeks    Status On-going             OT Long Term Goals - 09/10/19 1013      OT LONG TERM GOAL #1   Title Patient will return to highest level of independence while using her LUE for all daily and household tasks.    Time 8    Period Weeks    Status On-going      OT LONG TERM GOAL #2   Title Patient will increase LUE A/ROM to [facility] in order to reach into overhead cabinets with less difficulty.    Time 8    Period Weeks    Status On-going      OT LONG TERM GOAL #3   Title Patient will decrease fascial restrictions in LUE to min amount or less in order to increase functional mobilty needed to complete reaching and dressing tasks.    Time 8    Period Weeks    Status On-going      OT LONG TERM GOAL #4   Title Patient will increase her LUE strength to 4+/5 in order to return to completing her required yardwork tasks.    Time 8    Period Weeks    Status On-going      OT LONG TERM GOAL #5   Title Patient will report a decrease in pain of approximately 3/10 or less when completing daily and household chores using her LUE.    Time 8  Period Weeks     Status On-going                 Plan - 09/23/19 1254    Clinical Impression Statement A: Started with myofascial release and PROM. Continued AAROM exercises using dowel rod in supine, isometric exercises in standing, wall stretches. Added UE bike for [facility] and strengthening.  Providing verbalize cues throughout for form and positioning.    Body Structure / Function / Physical Skills ADL;Endurance;UE functional use;Fascial restriction;Pain;Flexibility;ROM;IADL;Strength    Plan P: Continue protocal. Focus on AAROM progressing to AROM.    OT Home Exercise Plan eval: table slides. 6/11 isometric exercises.           Patient will benefit from skilled therapeutic intervention in order to improve the following deficits and impairments:   Body Structure / Function / Physical Skills: ADL, Endurance, UE functional use, Fascial restriction, Pain, Flexibility, ROM, IADL, Strength       Visit Diagnosis: Acute pain of left shoulder  Other symptoms and signs involving the musculoskeletal system    Problem List Patient Active Problem List   Diagnosis Date Noted  . S/P arthroscopy of left shoulder RCR 08/19/19 08/25/2019  . Nontraumatic complete tear of left rotator cuff   . S/P right knee arthroscopy 02/25/2019 03/02/2019  . Complex tear of lateral meniscus of right knee as current injury   . Status post rotator cuff repair 10/15/2018 12/21/2018  . Biceps tendon rupture, proximal, right, subsequent encounter   . Primary osteoarthritis of right shoulder   . Labral tear of shoulder, degenerative, right   . Synovitis of right shoulder   . Spondylolisthesis at L3-L4 level 12/10/2016  . Bursitis, shoulder 02/24/2013  . HYPERLIPIDEMIA 02/04/2007  . WRIST SPRAIN, LEFT 02/04/2007  . MIGRAINE HEADACHE 01/15/2007  . BRONCHITIS, CHRONIC NOS 01/15/2007  . CONSTIPATION 01/15/2007  . IBS 01/15/2007  . DISC DISEASE, LUMBOSACRAL SPINE 01/15/2007  . LOW BACK PAIN, CHRONIC 01/15/2007  . MALAISE  AND FATIGUE 01/15/2007    Gabriel Rung, MSOT, OTR/L 09/23/2019, 1:01 PM  [facility] [address] [place], [place], [address] Phone: [phone]   Fax:  [phone]  Name: Jahna Liebert MRN: [id number] Date of Birth: 1950-07-04

## 2019-09-23 NOTE — Telephone Encounter (Signed)
Patient requests refill on Hydrocodone/Acetaminophen 7.5-325  Mgs.  Qty  21  Sig: Take 1 tablet by mouth every 8 (eight) hours as needed for up to 7 days for moderate pain or severe pain.  Patient uses Psychologist, forensic

## 2019-09-27 ENCOUNTER — Ambulatory Visit (HOSPITAL_COMMUNITY): Payer: Medicare Other | Admitting: Occupational Therapy

## 2019-09-27 ENCOUNTER — Encounter (HOSPITAL_COMMUNITY): Payer: Self-pay | Admitting: Occupational Therapy

## 2019-09-27 ENCOUNTER — Other Ambulatory Visit: Payer: Self-pay

## 2019-09-27 DIAGNOSIS — M25512 Pain in left shoulder: Secondary | ICD-10-CM | POA: Diagnosis not present

## 2019-09-27 DIAGNOSIS — R29898 Other symptoms and signs involving the musculoskeletal system: Secondary | ICD-10-CM

## 2019-09-27 NOTE — Therapy (Signed)
[facility] [address] [place], [place], [address] Phone: [phone]   Fax:  [phone]  Occupational Therapy Treatment  Patient Details  Name: Kathryn Woods MRN: [id number] Date of Birth: 1950/12/04 Referring Provider (OT): Dr. Fuller Canada   Encounter Date: 09/27/2019   OT End of Session - 09/27/19 1157    Visit Number 6    Number of Visits 16    Date for OT Re-Evaluation 10/31/19    Authorization Type UHC Medicare; $35 copay    Progress Note Due on Visit 10    OT Start Time 1116    OT Stop Time 1157    OT Time Calculation (min) 41 min    Activity Tolerance Patient tolerated treatment well    Behavior During Therapy [facility] for tasks assessed/performed           Past Medical History:  Diagnosis Date  . Arthritis    lumbar region, spondylolisthesis   . Hypercholesteremia     Past Surgical History:  Procedure Laterality Date  . BACK SURGERY    . KNEE ARTHROSCOPY WITH LATERAL MENISECTOMY Right 02/25/2019   Procedure: RIGHT KNEE ARTHROSCOPY WITH LATERAL MENISECTOMY;  Surgeon: Vickki Hearing, MD;  Location: [facility];  Service: Orthopedics;  Laterality: Right;  . SHOULDER ARTHROSCOPY WITH OPEN ROTATOR CUFF REPAIR Right 10/15/2018   Procedure: SHOULDER ARTHROSCOPY WITH OPEN ROTATOR CUFF REPAIR;  Surgeon: Vickki Hearing, MD;  Location: [facility];  Service: Orthopedics;  Laterality: Right;  . SHOULDER ARTHROSCOPY WITH ROTATOR CUFF REPAIR Left 08/19/2019   Procedure: LEFT SHOULDER ARTHROSCOPY WITH ROTATOR CUFF REPAIR;  Surgeon: Vickki Hearing, MD;  Location: [facility];  Service: Orthopedics;  Laterality: Left;  With scalene block   . TUBAL LIGATION      There were no vitals filed for this visit.   Subjective Assessment - 09/27/19 1117    Subjective  S: "I've been doing each of my exercises every day."    Pertinent History Pt is a 69 y/o female s/p left shoulder arthroscopic RCR on 08/19/19. Pt presents in sling,  reports she has been sore but feels it is doing well. Pt was referred to occupational therapy for evaluation and treatment by Dr. Fuller Canada.    Currently in Pain? Yes    Pain Score 3     Pain Location Shoulder    Pain Orientation Left    Pain Descriptors / Indicators Aching;Sore    Pain Type Acute pain              [facility] OT Assessment - 09/27/19 0001      Assessment   Medical Diagnosis s/p left shoulder arthroscopic RCR    Referring Provider (OT) Dr. Fuller Canada      Precautions   Precautions Shoulder    Type of Shoulder Precautions weeks 1-4 (5/13-6/10) P/ROM, pendulums, isometrics, scapular A/ROM; weeks 4-12 (6/10-8/5) AA/ROM and progress as tolerated. Week 12+ strengthening                     OT Treatments/Exercises (OP) - 09/27/19 1127      Exercises   Exercises Shoulder      Shoulder Exercises: Supine   Horizontal ABduction PROM;5 reps    External Rotation PROM;5 reps    Internal Rotation PROM;5 reps    Flexion PROM;5 reps    ABduction PROM;5 reps      Shoulder Exercises: Seated   Row AAROM;10 reps   with dowel  Horizontal ABduction AAROM;10 reps   with dowel   External Rotation AAROM;10 reps   with dowel   Internal Rotation AAROM;10 reps   with dowel   Flexion AAROM;10 reps   with dowel   Abduction AAROM;10 reps   with dowel     Shoulder Exercises: Pulleys   Flexion --   10x   ABduction --   10x     Shoulder Exercises: Therapy Ball   Flexion 10 reps    ABduction 10 reps      Shoulder Exercises: ROM/Strengthening   Wall Wash 10x flexion; 10x abduction    "W" Arms 10X    Proximal Shoulder Strengthening, Supine 1' each; rest breaks between      Modalities   Modalities Moist Heat      Moist Heat Therapy   Number Minutes Moist Heat 5 Minutes    Moist Heat Location Shoulder      Manual Therapy   Manual Therapy Myofascial release    Manual therapy comments manual therapy completed separately from therapeutic exercises     Myofascial Release myofascial release and manual techniques completed to left upper arm, trapezius, and scapular regions to decrease pain and fascial restrictions and increase joint ROM                  OT Education - 09/27/19 1130    Education Details Providing handout and education on AAROM exercises with dowel    Person(s) Educated Patient    Methods Explanation;Demonstration;Handout    Comprehension Verbalized understanding;Returned demonstration            OT Short Term Goals - 09/10/19 1012      OT SHORT TERM GOAL #1   Title Pt will be provided with and educated on HEP to improve ability to use LUE as non-dominant during ADLs.    Time 4    Period Weeks    Status On-going    Target Date 10/01/19      OT SHORT TERM GOAL #2   Title Pt will increase LUE P/ROM to [facility] to improve ability to perform dressing tasks with minimal compensatory strategies.    Time 4    Period Weeks    Status On-going      OT SHORT TERM GOAL #3   Title Pt will increase LUE strength to 3/5 to improve ability to reach items at waist to chest height during ADL completion.    Time 4    Period Weeks    Status On-going             OT Long Term Goals - 09/10/19 1013      OT LONG TERM GOAL #1   Title Patient will return to highest level of independence while using her LUE for all daily and household tasks.    Time 8    Period Weeks    Status On-going      OT LONG TERM GOAL #2   Title Patient will increase LUE A/ROM to [facility] in order to reach into overhead cabinets with less difficulty.    Time 8    Period Weeks    Status On-going      OT LONG TERM GOAL #3   Title Patient will decrease fascial restrictions in LUE to min amount or less in order to increase functional mobilty needed to complete reaching and dressing tasks.    Time 8    Period Weeks    Status On-going      OT LONG  TERM GOAL #4   Title Patient will increase her LUE strength to 4+/5 in order to return to completing her  required yardwork tasks.    Time 8    Period Weeks    Status On-going      OT LONG TERM GOAL #5   Title Patient will report a decrease in pain of approximately 3/10 or less when completing daily and household chores using her LUE.    Time 8    Period Weeks    Status On-going                 Plan - 09/27/19 1203    Clinical Impression Statement A: Continued myofascial release and PROM. Continued AAROM exercises with dowel rod seated, therapy ball stretches, wall stretches, and pulleys. Continued proximal strengthening in supine. Providing handout and education on AAROM with dowel for home. Pt reporting increased tightness with abduction today; ended with heat for pain and tightness. Providing cues throughout on form and technique.    Body Structure / Function / Physical Skills ADL;Endurance;UE functional use;Fascial restriction;Pain;Flexibility;ROM;IADL;Strength    OT Treatment/Interventions Self-care/ADL training;Ultrasound;Patient/family education;Passive range of motion;Cryotherapy;Electrical Stimulation;Moist Heat;Therapeutic exercise;Manual Therapy;Therapeutic activities    Plan P: Continue protocal. Focus on AAROM progressing to AROM. Review HEP handout on AAROM.    OT Home Exercise Plan eval: table slides. 6/11 isometric exercises. 6/21 AAROM with dowel.    Consulted and Agree with Plan of Care Patient           Patient will benefit from skilled therapeutic intervention in order to improve the following deficits and impairments:   Body Structure / Function / Physical Skills: ADL, Endurance, UE functional use, Fascial restriction, Pain, Flexibility, ROM, IADL, Strength       Visit Diagnosis: Acute pain of left shoulder  Other symptoms and signs involving the musculoskeletal system    Problem List Patient Active Problem List   Diagnosis Date Noted  . S/P arthroscopy of left shoulder RCR 08/19/19 08/25/2019  . Nontraumatic complete tear of left rotator cuff   . S/P  right knee arthroscopy 02/25/2019 03/02/2019  . Complex tear of lateral meniscus of right knee as current injury   . Status post rotator cuff repair 10/15/2018 12/21/2018  . Biceps tendon rupture, proximal, right, subsequent encounter   . Primary osteoarthritis of right shoulder   . Labral tear of shoulder, degenerative, right   . Synovitis of right shoulder   . Spondylolisthesis at L3-L4 level 12/10/2016  . Bursitis, shoulder 02/24/2013  . HYPERLIPIDEMIA 02/04/2007  . WRIST SPRAIN, LEFT 02/04/2007  . MIGRAINE HEADACHE 01/15/2007  . BRONCHITIS, CHRONIC NOS 01/15/2007  . CONSTIPATION 01/15/2007  . IBS 01/15/2007  . [place] DISEASE, LUMBOSACRAL SPINE 01/15/2007  . LOW BACK PAIN, CHRONIC 01/15/2007  . MALAISE AND FATIGUE 01/15/2007    Neal Dy, MSOT, OTR/L 09/27/2019, 12:07 PM  [facility] [address] [place], [place], [address] Phone: [phone]   Fax:  [phone]  Name: Maelys Kinnick MRN: [id number] Date of Birth: [birth year]

## 2019-09-27 NOTE — Patient Instructions (Signed)

## 2019-09-30 ENCOUNTER — Encounter (HOSPITAL_COMMUNITY): Payer: Self-pay | Admitting: Occupational Therapy

## 2019-09-30 ENCOUNTER — Ambulatory Visit (HOSPITAL_COMMUNITY): Payer: Medicare Other | Admitting: Occupational Therapy

## 2019-09-30 ENCOUNTER — Other Ambulatory Visit: Payer: Self-pay

## 2019-09-30 DIAGNOSIS — R29898 Other symptoms and signs involving the musculoskeletal system: Secondary | ICD-10-CM

## 2019-09-30 DIAGNOSIS — M25512 Pain in left shoulder: Secondary | ICD-10-CM | POA: Diagnosis not present

## 2019-09-30 NOTE — Therapy (Signed)
[facility] [address] [place], [place], [address] Phone: [phone]   Fax:  [phone]  Occupational Therapy Treatment  Patient Details  Name: Kathryn Woods MRN: [id number] Date of Birth: 1951/04/08 Referring Provider (OT): Dr. Fuller Canada   Encounter Date: 09/30/2019   OT End of Session - 09/30/19 1243    Visit Number 7    Number of Visits 16    Date for OT Re-Evaluation 10/31/19    Authorization Type UHC Medicare; $35 copay    Progress Note Due on Visit 10    OT Start Time 1033    OT Stop Time 1113    OT Time Calculation (min) 40 min    Activity Tolerance Patient tolerated treatment well    Behavior During Therapy [facility] for tasks assessed/performed           Past Medical History:  Diagnosis Date  . Arthritis    lumbar region, spondylolisthesis   . Hypercholesteremia     Past Surgical History:  Procedure Laterality Date  . BACK SURGERY    . KNEE ARTHROSCOPY WITH LATERAL MENISECTOMY Right 02/25/2019   Procedure: RIGHT KNEE ARTHROSCOPY WITH LATERAL MENISECTOMY;  Surgeon: Vickki Hearing, MD;  Location: [facility];  Service: Orthopedics;  Laterality: Right;  . SHOULDER ARTHROSCOPY WITH OPEN ROTATOR CUFF REPAIR Right 10/15/2018   Procedure: SHOULDER ARTHROSCOPY WITH OPEN ROTATOR CUFF REPAIR;  Surgeon: Vickki Hearing, MD;  Location: [facility];  Service: Orthopedics;  Laterality: Right;  . SHOULDER ARTHROSCOPY WITH ROTATOR CUFF REPAIR Left 08/19/2019   Procedure: LEFT SHOULDER ARTHROSCOPY WITH ROTATOR CUFF REPAIR;  Surgeon: Vickki Hearing, MD;  Location: [facility];  Service: Orthopedics;  Laterality: Left;  With scalene block   . TUBAL LIGATION      There were no vitals filed for this visit.   Subjective Assessment - 09/30/19 1036    Subjective  S: "I washed the windows this weekend. My arm is sore."    Pertinent History Pt is a 69 y/o female s/p left shoulder arthroscopic RCR on 08/19/19. Pt presents in  sling, reports she has been sore but feels it is doing well. Pt was referred to occupational therapy for evaluation and treatment by Dr. Fuller Canada.    Currently in Pain? No/denies              [facility] OT Assessment - 09/30/19 1249      Assessment   Medical Diagnosis s/p left shoulder arthroscopic RCR    Referring Provider (OT) Dr. Fuller Canada      Precautions   Precautions Shoulder    Type of Shoulder Precautions weeks 1-4 (5/13-6/10) P/ROM, pendulums, isometrics, scapular A/ROM; weeks 4-12 (6/10-8/5) AA/ROM and progress as tolerated. Week 12+ strengthening     Shoulder Interventions Shoulder sling/immobilizer;Off for dressing/bathing/exercises                    OT Treatments/Exercises (OP) - 09/30/19 1036      Exercises   Exercises Shoulder      Shoulder Exercises: Supine   Horizontal ABduction PROM;5 reps    External Rotation PROM;5 reps    Internal Rotation PROM;5 reps    Flexion PROM;5 reps    ABduction PROM;5 reps      Shoulder Exercises: Seated   Row AAROM;10 reps   dowel rod   Horizontal ABduction AAROM;10 reps   with dowel rod   External Rotation AAROM;10 reps   with dowel rod   Internal Rotation  AAROM;10 reps   with dowel rod   Flexion AAROM;10 reps   with dowel rod   Abduction AAROM;10 reps   with dowel rod     Shoulder Exercises: Therapy Ball   Flexion --   1'   ABduction --   1'     Shoulder Exercises: ROM/Strengthening   Wall Wash 10x flexion; 10x abduction    "W" Arms 10X    Proximal Shoulder Strengthening, Supine 1' each; rest breaks between      Shoulder Exercises: Stretch   Internal Rotation Stretch --   x10 with towel   External Rotation Stretch --   x10 with towel   Other Shoulder Stretches shoulder flexion and ABD stretches with therapy ball 1 min each    Other Shoulder Stretches door frame stretch L arm hold and then torso twist out to right 5 reps for 3 sets      Manual Therapy   Manual Therapy Myofascial release     Manual therapy comments manual therapy completed separately from therapeutic exercises    Myofascial Release myofascial release and manual techniques completed to left upper arm, trapezius, and scapular regions to decrease pain and fascial restrictions and increase joint ROM                    OT Short Term Goals - 09/10/19 1012      OT SHORT TERM GOAL #1   Title Pt will be provided with and educated on HEP to improve ability to use LUE as non-dominant during ADLs.    Time 4    Period Weeks    Status On-going    Target Date 10/01/19      OT SHORT TERM GOAL #2   Title Pt will increase LUE P/ROM to [facility] to improve ability to perform dressing tasks with minimal compensatory strategies.    Time 4    Period Weeks    Status On-going      OT SHORT TERM GOAL #3   Title Pt will increase LUE strength to 3/5 to improve ability to reach items at waist to chest height during ADL completion.    Time 4    Period Weeks    Status On-going             OT Long Term Goals - 09/10/19 1013      OT LONG TERM GOAL #1   Title Patient will return to highest level of independence while using her LUE for all daily and household tasks.    Time 8    Period Weeks    Status On-going      OT LONG TERM GOAL #2   Title Patient will increase LUE A/ROM to [facility] in order to reach into overhead cabinets with less difficulty.    Time 8    Period Weeks    Status On-going      OT LONG TERM GOAL #3   Title Patient will decrease fascial restrictions in LUE to min amount or less in order to increase functional mobilty needed to complete reaching and dressing tasks.    Time 8    Period Weeks    Status On-going      OT LONG TERM GOAL #4   Title Patient will increase her LUE strength to 4+/5 in order to return to completing her required yardwork tasks.    Time 8    Period Weeks    Status On-going      OT LONG TERM GOAL #  5   Title Patient will report a decrease in pain of approximately 3/10 or less  when completing daily and household chores using her LUE.    Time 8    Period Weeks    Status On-going                 Plan - 09/30/19 1244    Clinical Impression Statement A: Continued myofascial release and PROM. Continued AAROM exercises with dowel rod seated, therapy ball stretches, and wall stretches. Continued proximal strengthening in supine and circles on wall with wash clothes. Introduced internal rotation stretch with towel. Providing cues throughout on form and technique.    Body Structure / Function / Physical Skills ADL;Endurance;UE functional use;Fascial restriction;Pain;Flexibility;ROM;IADL;Strength    OT Treatment/Interventions Self-care/ADL training;Ultrasound;Patient/family education;Passive range of motion;Cryotherapy;Electrical Stimulation;Moist Heat;Therapeutic exercise;Manual Therapy;Therapeutic activities    Plan P: Continue protocol. Focus on AAROM progressing to AROM and proximal strengthening.    OT Home Exercise Plan eval: table slides. 6/11 isometric exercises. 6/21 AAROM with dowel.    Consulted and Agree with Plan of Care Patient           Patient will benefit from skilled therapeutic intervention in order to improve the following deficits and impairments:   Body Structure / Function / Physical Skills: ADL, Endurance, UE functional use, Fascial restriction, Pain, Flexibility, ROM, IADL, Strength       Visit Diagnosis: Acute pain of left shoulder  Other symptoms and signs involving the musculoskeletal system    Problem List Patient Active Problem List   Diagnosis Date Noted  . S/P arthroscopy of left shoulder RCR 08/19/19 08/25/2019  . Nontraumatic complete tear of left rotator cuff   . S/P right knee arthroscopy 02/25/2019 03/02/2019  . Complex tear of lateral meniscus of right knee as current injury   . Status post rotator cuff repair 10/15/2018 12/21/2018  . Biceps tendon rupture, proximal, right, subsequent encounter   . Primary  osteoarthritis of right shoulder   . Labral tear of shoulder, degenerative, right   . Synovitis of right shoulder   . Spondylolisthesis at L3-L4 level 12/10/2016  . Bursitis, shoulder 02/24/2013  . HYPERLIPIDEMIA 02/04/2007  . WRIST SPRAIN, LEFT 02/04/2007  . MIGRAINE HEADACHE 01/15/2007  . BRONCHITIS, CHRONIC NOS 01/15/2007  . CONSTIPATION 01/15/2007  . IBS 01/15/2007  . [place] DISEASE, LUMBOSACRAL SPINE 01/15/2007  . LOW BACK PAIN, CHRONIC 01/15/2007  . MALAISE AND FATIGUE 01/15/2007    Neal Dy, MSOT, OTR/L 09/30/2019, 12:51 PM  [facility] [address] [place], [place], [address] Phone: [phone]   Fax:  [phone]  Name: Kathryn Woods MRN: [id number] Date of Birth: 04-05-1951

## 2019-10-04 ENCOUNTER — Telehealth (HOSPITAL_COMMUNITY): Payer: Self-pay | Admitting: Occupational Therapy

## 2019-10-04 ENCOUNTER — Ambulatory Visit (HOSPITAL_COMMUNITY): Payer: Medicare Other | Admitting: Occupational Therapy

## 2019-10-04 NOTE — Telephone Encounter (Signed)
pt called to cancel this appt due to she has a funeral to attend.

## 2019-10-06 MED ORDER — HYDROCODONE-ACETAMINOPHEN 7.5-325 MG PO TABS: 1.0000 | ORAL_TABLET | Freq: Three times a day (TID) | ORAL | 0 refills | Status: DC | PRN

## 2019-10-06 NOTE — Telephone Encounter (Signed)
Pt said she is in a lot of pain with both arms. She is requesting something for pain. [facility] [place]

## 2019-10-06 NOTE — Addendum Note (Signed)
Addended byCaffie Damme on: 10/06/2019 12:04 PM   Modules accepted: Orders

## 2019-10-07 ENCOUNTER — Encounter (HOSPITAL_COMMUNITY): Payer: Medicare Other | Admitting: Specialist

## 2019-10-12 ENCOUNTER — Ambulatory Visit (HOSPITAL_COMMUNITY): Payer: Medicare Other | Attending: Orthopedic Surgery | Admitting: Occupational Therapy

## 2019-10-12 ENCOUNTER — Encounter (HOSPITAL_COMMUNITY): Payer: Self-pay | Admitting: Occupational Therapy

## 2019-10-12 ENCOUNTER — Other Ambulatory Visit: Payer: Self-pay

## 2019-10-12 DIAGNOSIS — M25512 Pain in left shoulder: Secondary | ICD-10-CM | POA: Diagnosis present

## 2019-10-12 DIAGNOSIS — R29898 Other symptoms and signs involving the musculoskeletal system: Secondary | ICD-10-CM | POA: Diagnosis present

## 2019-10-12 NOTE — Therapy (Signed)
[facility] [address] [place], [place], [address] Phone: [phone]   Fax:  [phone]  Occupational Therapy Treatment  Patient Details  Name: Kathryn Woods MRN: [id number] Date of Birth: 05-17-1950 Referring Provider (OT): Dr. Fuller Canada   Encounter Date: 10/12/2019   OT End of Session - 10/12/19 1834    Visit Number 8    Number of Visits 16    Date for OT Re-Evaluation 10/31/19    Authorization Type U[employer] Medicare; $35 copay    Progress Note Due on Visit 10    OT Start Time 1300    OT Stop Time 1342    OT Time Calculation (min) 42 min    Activity Tolerance Patient tolerated treatment well    Behavior During Therapy [facility] for tasks assessed/performed           Past Medical History:  Diagnosis Date  . Arthritis    lumbar region, spondylolisthesis   . Hypercholesteremia     Past Surgical History:  Procedure Laterality Date  . BACK SURGERY    . KNEE ARTHROSCOPY WITH LATERAL MENISECTOMY Right 02/25/2019   Procedure: RIGHT KNEE ARTHROSCOPY WITH LATERAL MENISECTOMY;  Surgeon: Vickki Hearing, MD;  Location: [facility];  Service: Orthopedics;  Laterality: Right;  . SHOULDER ARTHROSCOPY WITH OPEN ROTATOR CUFF REPAIR Right 10/15/2018   Procedure: SHOULDER ARTHROSCOPY WITH OPEN ROTATOR CUFF REPAIR;  Surgeon: Vickki Hearing, MD;  Location: [facility];  Service: Orthopedics;  Laterality: Right;  . SHOULDER ARTHROSCOPY WITH ROTATOR CUFF REPAIR Left 08/19/2019   Procedure: LEFT SHOULDER ARTHROSCOPY WITH ROTATOR CUFF REPAIR;  Surgeon: Vickki Hearing, MD;  Location: [facility];  Service: Orthopedics;  Laterality: Left;  With scalene block   . TUBAL LIGATION      There were no vitals filed for this visit.   Subjective Assessment - 10/12/19 1302    Subjective  S: "I've missed the last two appointments, and I bet I'll be sore today. I've been doing my exercises though."    Pertinent History Pt is a 69 y/o female s/p left  shoulder arthroscopic RCR on 08/19/19. Pt presents in sling, reports she has been sore but feels it is doing well. Pt was referred to occupational therapy for evaluation and treatment by Dr. Fuller Canada.    Currently in Pain? Yes    Pain Score 2     Pain Location Shoulder    Pain Orientation Left              [facility] OT Assessment - 10/12/19 0001      Assessment   Medical Diagnosis s/p left shoulder arthroscopic RCR    Referring Provider (OT) Dr. Fuller Canada      Precautions   Precautions Shoulder    Type of Shoulder Precautions weeks 1-4 (5/13-6/10) P/ROM, pendulums, isometrics, scapular A/ROM; weeks 4-12 (6/10-8/5) AA/ROM and progress as tolerated. Week 12+ strengthening                     OT Treatments/Exercises (OP) - 10/12/19 0001      Exercises   Exercises Shoulder      Shoulder Exercises: Supine   Horizontal ABduction PROM;5 reps    External Rotation PROM;5 reps    Internal Rotation PROM;5 reps    Flexion PROM;5 reps    ABduction PROM;5 reps      Shoulder Exercises: Seated   Elevation AROM;10 reps    Retraction AROM;10 reps    Row AAROM;10  reps   Dowel   Horizontal ABduction AAROM;10 reps    External Rotation AAROM;10 reps   Dowel   Internal Rotation AAROM;10 reps   Dowel   Flexion AAROM;10 reps   Dowel   Abduction AAROM;10 reps   Dowel     Shoulder Exercises: Standing   External Rotation Strengthening;10 reps;Theraband    Theraband Level (Shoulder External Rotation) Level 2 (Red)    Internal Rotation Strengthening;10 reps;Theraband    Theraband Level (Shoulder Internal Rotation) Level 2 (Red)    Extension Strengthening;10 reps;Theraband    Theraband Level (Shoulder Extension) Level 2 (Red)    Row Strengthening;10 reps;Theraband    Theraband Level (Shoulder Row) Level 2 (Red)      Shoulder Exercises: ROM/Strengthening   UBE (Upper Arm Bike) 2' forward. 2' backwards. pace 6    Wall Wash 10x flexion; 10x abduction    Thumb Tacks 20x;  hip level    "W" Arms 10X    Proximal Shoulder Strengthening, Supine 1' each; rest breaks between      Modalities   Modalities Moist Heat      Moist Heat Therapy   Number Minutes Moist Heat 3 Minutes    Moist Heat Location Shoulder      Manual Therapy   Manual Therapy Myofascial release    Manual therapy comments manual therapy completed separately from therapeutic exercises    Myofascial Release myofascial release and manual techniques completed to left upper arm, trapezius, and scapular regions to decrease pain and fascial restrictions and increase joint ROM                    OT Short Term Goals - 09/10/19 1012      OT SHORT TERM GOAL #1   Title Pt will be provided with and educated on HEP to improve ability to use LUE as non-dominant during ADLs.    Time 4    Period Weeks    Status On-going    Target Date 10/01/19      OT SHORT TERM GOAL #2   Title Pt will increase LUE P/ROM to [facility] to improve ability to perform dressing tasks with minimal compensatory strategies.    Time 4    Period Weeks    Status On-going      OT SHORT TERM GOAL #3   Title Pt will increase LUE strength to 3/5 to improve ability to reach items at waist to chest height during ADL completion.    Time 4    Period Weeks    Status On-going             OT Long Term Goals - 09/10/19 1013      OT LONG TERM GOAL #1   Title Patient will return to highest level of independence while using her LUE for all daily and household tasks.    Time 8    Period Weeks    Status On-going      OT LONG TERM GOAL #2   Title Patient will increase LUE A/ROM to [facility] in order to reach into overhead cabinets with less difficulty.    Time 8    Period Weeks    Status On-going      OT LONG TERM GOAL #3   Title Patient will decrease fascial restrictions in LUE to min amount or less in order to increase functional mobilty needed to complete reaching and dressing tasks.    Time 8    Period Weeks    Status  On-going      OT LONG TERM GOAL #4   Title Patient will increase her LUE strength to 4+/5 in order to return to completing her required yardwork tasks.    Time 8    Period Weeks    Status On-going      OT LONG TERM GOAL #5   Title Patient will report a decrease in pain of approximately 3/10 or less when completing daily and household chores using her LUE.    Time 8    Period Weeks    Status On-going                 Plan - 10/12/19 1835    Clinical Impression Statement A: Continued myofascial release and PROM. Continued AAROM exercises with dowel rod seated and wall stretches. Continued proximal strengthening in supine, theraband, and W lifts. Providing cues throughout on form and technique.    Body Structure / Function / Physical Skills ADL;Endurance;UE functional use;Fascial restriction;Pain;Flexibility;ROM;IADL;Strength    OT Treatment/Interventions Self-care/ADL training;Ultrasound;Patient/family education;Passive range of motion;Cryotherapy;Electrical Stimulation;Moist Heat;Therapeutic exercise;Manual Therapy;Therapeutic activities    Plan P: Continue protocol. Focus on AAROM progressing to AROM and proximal strengthening.    OT Home Exercise Plan eval: table slides. 6/11 isometric exercises. 6/21 AAROM with dowel.    Consulted and Agree with Plan of Care Patient           Patient will benefit from skilled therapeutic intervention in order to improve the following deficits and impairments:   Body Structure / Function / Physical Skills: ADL, Endurance, UE functional use, Fascial restriction, Pain, Flexibility, ROM, IADL, Strength       Visit Diagnosis: Acute pain of left shoulder  Other symptoms and signs involving the musculoskeletal system    Problem List Patient Active Problem List   Diagnosis Date Noted  . S/P arthroscopy of left shoulder RCR 08/19/19 08/25/2019  . Nontraumatic complete tear of left rotator cuff   . S/P right knee arthroscopy 02/25/2019  03/02/2019  . Complex tear of lateral meniscus of right knee as current injury   . Status post rotator cuff repair 10/15/2018 12/21/2018  . Biceps tendon rupture, proximal, right, subsequent encounter   . Primary osteoarthritis of right shoulder   . Labral tear of shoulder, degenerative, right   . Synovitis of right shoulder   . Spondylolisthesis at L3-L4 level 12/10/2016  . Bursitis, shoulder 02/24/2013  . HYPERLIPIDEMIA 02/04/2007  . WRIST SPRAIN, LEFT 02/04/2007  . MIGRAINE HEADACHE 01/15/2007  . BRONCHITIS, CHRONIC NOS 01/15/2007  . CONSTIPATION 01/15/2007  . IBS 01/15/2007  . DISC DISEASE, LUMBOSACRAL SPINE 01/15/2007  . LOW BACK PAIN, CHRONIC 01/15/2007  . MALAISE AND FATIGUE 01/15/2007    Gabriel Rung, MSOT, OTR/L 10/12/2019, 6:37 PM  [facility] [address] [place], [place], [address] Phone: [phone]   Fax:  [phone]  Name: Kathryn Woods MRN: [id number] Date of Birth: February 13, 1951

## 2019-10-14 ENCOUNTER — Encounter (HOSPITAL_COMMUNITY): Payer: Self-pay | Admitting: Occupational Therapy

## 2019-10-14 ENCOUNTER — Encounter: Payer: Self-pay | Admitting: Orthopedic Surgery

## 2019-10-14 ENCOUNTER — Ambulatory Visit (HOSPITAL_COMMUNITY): Payer: Medicare Other | Admitting: Occupational Therapy

## 2019-10-14 ENCOUNTER — Other Ambulatory Visit: Payer: Self-pay

## 2019-10-14 ENCOUNTER — Ambulatory Visit (INDEPENDENT_AMBULATORY_CARE_PROVIDER_SITE_OTHER): Payer: Medicare Other | Admitting: Orthopedic Surgery

## 2019-10-14 VITALS — Ht 66.0 in | Wt 173.0 lb

## 2019-10-14 DIAGNOSIS — M25512 Pain in left shoulder: Secondary | ICD-10-CM

## 2019-10-14 DIAGNOSIS — Z9889 Other specified postprocedural states: Secondary | ICD-10-CM

## 2019-10-14 DIAGNOSIS — R29898 Other symptoms and signs involving the musculoskeletal system: Secondary | ICD-10-CM

## 2019-10-14 DIAGNOSIS — G8918 Other acute postprocedural pain: Secondary | ICD-10-CM

## 2019-10-14 MED ORDER — HYDROCODONE-ACETAMINOPHEN 7.5-325 MG PO TABS: 1.0000 | ORAL_TABLET | Freq: Three times a day (TID) | ORAL | 0 refills | Status: AC | PRN

## 2019-10-14 NOTE — Patient Instructions (Signed)
Change to once a week at OT, home exercises

## 2019-10-14 NOTE — Progress Notes (Signed)
Chief Complaint  Patient presents with  . Routine Post Op    Lt shoulder DOS 08/19/19  . Medication Refill    Hydrocodone    Encounter Diagnoses  Name Primary?  . S/P arthroscopy of left shoulder RCR 08/19/19 Yes  . 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 HYDROcodone-acetaminophen (NORCO) 7.5-325 MG tablet    Sig: Take 1 tablet by mouth every 8 (eight) hours as needed for up to 7 days for moderate pain or severe pain.    Dispense:  21 tablet    Refill:  0

## 2019-10-14 NOTE — Patient Instructions (Signed)

## 2019-10-14 NOTE — Therapy (Signed)
[facility] [address] [place], [place], [address] Phone: [phone]   Fax:  [phone]  Occupational Therapy Treatment  Patient Details  Name: Kathryn Woods MRN: [id number] Date of Birth: Oct 09, 1950 Referring Provider (OT): Dr. Fuller Canada   Encounter Date: 10/14/2019   OT End of Session - 10/14/19 1205    Visit Number 9    Number of Visits 16    Date for OT Re-Evaluation 10/31/19   Mini-reassessment completed 10/14/19   Authorization Type [employer] Medicare; $35 copay    Progress Note Due on Visit 10    OT Start Time 1116    OT Stop Time 1159    OT Time Calculation (min) 43 min    Activity Tolerance Patient tolerated treatment well    Behavior During Therapy [facility] for tasks assessed/performed           Past Medical History:  Diagnosis Date  . Arthritis    lumbar region, spondylolisthesis   . Hypercholesteremia     Past Surgical History:  Procedure Laterality Date  . BACK SURGERY    . KNEE ARTHROSCOPY WITH LATERAL MENISECTOMY Right 02/25/2019   Procedure: RIGHT KNEE ARTHROSCOPY WITH LATERAL MENISECTOMY;  Surgeon: Vickki Hearing, MD;  Location: [facility];  Service: Orthopedics;  Laterality: Right;  . SHOULDER ARTHROSCOPY WITH OPEN ROTATOR CUFF REPAIR Right 10/15/2018   Procedure: SHOULDER ARTHROSCOPY WITH OPEN ROTATOR CUFF REPAIR;  Surgeon: Vickki Hearing, MD;  Location: [facility];  Service: Orthopedics;  Laterality: Right;  . SHOULDER ARTHROSCOPY WITH ROTATOR CUFF REPAIR Left 08/19/2019   Procedure: LEFT SHOULDER ARTHROSCOPY WITH ROTATOR CUFF REPAIR;  Surgeon: Vickki Hearing, MD;  Location: [facility];  Service: Orthopedics;  Laterality: Left;  With scalene block   . TUBAL LIGATION      There were no vitals filed for this visit.   Subjective Assessment - 10/14/19 1118    Subjective  S: Its feeling achy this morning    Pertinent History Pt is a 69 y/o female s/p left shoulder arthroscopic RCR on 08/19/19. Pt  presents in sling, reports she has been sore but feels it is doing well. Pt was referred to occupational therapy for evaluation and treatment by Dr. Fuller Canada.    Currently in Pain? Yes    Pain Score 2     Pain Location Shoulder    Pain Orientation Left    Pain Descriptors / Indicators Aching              [facility] OT Assessment - 10/14/19 1120      Assessment   Medical Diagnosis s/p left shoulder arthroscopic RCR    Referring Provider (OT) Dr. Fuller Canada      Precautions   Precautions Shoulder    Type of Shoulder Precautions weeks 1-4 (5/13-6/10) P/ROM, pendulums, isometrics, scapular A/ROM; weeks 4-12 (6/10-8/5) AA/ROM and progress as tolerated. Week 12+ strengthening     Shoulder Interventions Shoulder sling/immobilizer;Off for dressing/bathing/exercises      Observation/Other Assessments   Focus on Therapeutic Outcomes (FOTO)  72.27   39     ROM / Strength   AROM / PROM / Strength AROM;PROM;Strength      PROM   Overall PROM Comments Assessed supine, er/IR adducted    PROM Assessment Site Shoulder    Right/Left Shoulder Left    Left Shoulder Flexion 158 Degrees   130   Left Shoulder ABduction 150 Degrees   120   Left Shoulder Internal Rotation 90 Degrees  90   Left Shoulder External Rotation 76 Degrees   69                   OT Treatments/Exercises (OP) - 10/14/19 1137      Exercises   Exercises Shoulder      Shoulder Exercises: Supine   Horizontal ABduction PROM;5 reps    External Rotation PROM;5 reps    Internal Rotation PROM;5 reps    Flexion PROM;5 reps    ABduction PROM;5 reps      Shoulder Exercises: Seated   Elevation --    Retraction --    Row AAROM;15 reps   Dowel   Horizontal ABduction AAROM;15 reps    External Rotation AAROM;15 reps   Dowel   Internal Rotation AAROM;15 reps   Dowel   Flexion AAROM;15 reps   Dowel   Abduction AAROM;15 reps   Dowel     Shoulder Exercises: Standing   External Rotation --    Theraband Level  (Shoulder External Rotation) --    Internal Rotation --    Theraband Level (Shoulder Internal Rotation) --    Extension --    Theraband Level (Shoulder Extension) --    Row --    Theraband Level (Shoulder Row) --      Shoulder Exercises: Pulleys   Flexion Other (comment)   15x   ABduction Other (comment)   15x     Shoulder Exercises: ROM/Strengthening   UBE (Upper Arm Bike) --    Wall Wash 10x flexion; 10x abduction    Thumb Tacks 20x shoulder level    "W" Arms 10X    Proximal Shoulder Strengthening, Supine --    Proximal Shoulder Strengthening, Seated 30' each; rest breaks between      Shoulder Exercises: Stretch   Cross Chest Stretch 5 reps;10 seconds    Internal Rotation Stretch 5 reps   10 sec hold with towel   External Rotation Stretch 5 reps   10 sec hold with towel   Wall Stretch - Flexion 5 reps;10 seconds    Wall Stretch - ABduction 5 reps;10 seconds    Other Shoulder Stretches door frame stretch L arm hold and then torso twist out to right 5 reps for 10 sec      Modalities   Modalities --      Moist Heat Therapy   Moist Heat Location --      Manual Therapy   Manual Therapy Myofascial release    Manual therapy comments manual therapy completed separately from therapeutic exercises    Myofascial Release myofascial release and manual techniques completed to left upper arm, trapezius, and scapular regions to decrease pain and fascial restrictions and increase joint ROM                  OT Education - 10/14/19 1206    Education Details Provided handout and education on shoulder stretches.    Person(s) Educated Patient    Comprehension Verbalized understanding;Returned demonstration            OT Short Term Goals - 09/10/19 1012      OT SHORT TERM GOAL #1   Title Pt will be provided with and educated on HEP to improve ability to use LUE as non-dominant during ADLs.    Time 4    Period Weeks    Status On-going    Target Date 10/01/19      OT SHORT  TERM GOAL #2   Title Pt will increase LUE  [facility] to [facility] to improve ability to perform dressing tasks with minimal compensatory strategies.    Time 4    Period Weeks    Status On-going      OT SHORT TERM GOAL #3   Title Pt will increase LUE strength to 3/5 to improve ability to reach items at waist to chest height during ADL completion.    Time 4    Period Weeks    Status On-going             OT Long Term Goals - 09/10/19 1013      OT LONG TERM GOAL #1   Title Patient will return to highest level of independence while using her LUE for all daily and household tasks.    Time 8    Period Weeks    Status On-going      OT LONG TERM GOAL #2   Title Patient will increase LUE A/ROM to [facility] in order to reach into overhead cabinets with less difficulty.    Time 8    Period Weeks    Status On-going      OT LONG TERM GOAL #3   Title Patient will decrease fascial restrictions in LUE to min amount or less in order to increase functional mobilty needed to complete reaching and dressing tasks.    Time 8    Period Weeks    Status On-going      OT LONG TERM GOAL #4   Title Patient will increase her LUE strength to 4+/5 in order to return to completing her required yardwork tasks.    Time 8    Period Weeks    Status On-going      OT LONG TERM GOAL #5   Title Patient will report a decrease in pain of approximately 3/10 or less when completing daily and household chores using her LUE.    Time 8    Period Weeks    Status On-going                 Plan - 10/14/19 1207    Clinical Impression Statement A: Mini reassessment. Increased PROM measurements and demonstrating functional AAROM. Continued myofascial release and PROM. Increasing difficulty of proximal strengthening with "arms on fire" seated and "thumb tacks" at shoulder height. Providing pt with education and handout on shoulder stretches to add to HEP. Pt demonstrating understanding.    Body Structure / Function / Physical  Skills ADL;Endurance;UE functional use;Fascial restriction;Pain;Flexibility;ROM;IADL;Strength    OT Treatment/Interventions Self-care/ADL training;Ultrasound;Patient/family education;Passive range of motion;Cryotherapy;Electrical Stimulation;Moist Heat;Therapeutic exercise;Manual Therapy;Therapeutic activities    Plan P: Continue protocol. Focus on AAROM progressing to AROM and proximal strengthening.    OT Home Exercise Plan eval: table slides. 6/11 isometric exercises. 6/21 AAROM with dowel. 7/8 shoulder stretches.    Consulted and Agree with Plan of Care Patient           Patient will benefit from skilled therapeutic intervention in order to improve the following deficits and impairments:   Body Structure / Function / Physical Skills: ADL, Endurance, UE functional use, Fascial restriction, Pain, Flexibility, ROM, IADL, Strength       Visit Diagnosis: Acute pain of left shoulder  Other symptoms and signs involving the musculoskeletal system    Problem List Patient Active Problem List   Diagnosis Date Noted  . S/P arthroscopy of left shoulder RCR 08/19/19 08/25/2019  . Nontraumatic complete tear of left rotator cuff   . S/P right knee arthroscopy 02/25/2019 03/02/2019  .  Complex tear of lateral meniscus of right knee as current injury   . Status post rotator cuff repair 10/15/2018 12/21/2018  . Biceps tendon rupture, proximal, right, subsequent encounter   . Primary osteoarthritis of right shoulder   . Labral tear of shoulder, degenerative, right   . Synovitis of right shoulder   . Spondylolisthesis at L3-L4 level 12/10/2016  . Bursitis, shoulder 02/24/2013  . HYPERLIPIDEMIA 02/04/2007  . WRIST SPRAIN, LEFT 02/04/2007  . MIGRAINE HEADACHE 01/15/2007  . BRONCHITIS, CHRONIC NOS 01/15/2007  . CONSTIPATION 01/15/2007  . IBS 01/15/2007  . DISC DISEASE, LUMBOSACRAL SPINE 01/15/2007  . LOW BACK PAIN, CHRONIC 01/15/2007  . MALAISE AND FATIGUE 01/15/2007    Gabriel Rung,  MSOT, OTR/L 10/14/2019, 12:12 PM  [address] [facility] [address] [place], [place], [address] Phone: [phone]   Fax:  [phone]  Name: Daylyn Christine MRN: [id number] Date of Birth: [birth year]

## 2019-10-18 ENCOUNTER — Ambulatory Visit (HOSPITAL_COMMUNITY): Payer: Medicare Other | Admitting: Occupational Therapy

## 2019-10-18 ENCOUNTER — Encounter (HOSPITAL_COMMUNITY): Payer: Self-pay | Admitting: Occupational Therapy

## 2019-10-18 ENCOUNTER — Other Ambulatory Visit: Payer: Self-pay

## 2019-10-18 DIAGNOSIS — R29898 Other symptoms and signs involving the musculoskeletal system: Secondary | ICD-10-CM

## 2019-10-18 DIAGNOSIS — M25512 Pain in left shoulder: Secondary | ICD-10-CM | POA: Diagnosis not present

## 2019-10-18 NOTE — Therapy (Addendum)
[facility] [address] [place], [place], [address] Phone: [phone]   Fax:  [phone]  Occupational Therapy Treatment  Patient Details  Name: Kathryn Woods MRN: [id number] Date of Birth: 1950-05-10 Referring Provider (OT): Dr. Fuller Canada   Progress Note Reporting Period 09/01/19 to 10/18/19  See note below for Objective Data and Assessment of Progress/Goals.     Encounter Date: 10/18/2019   OT End of Session - 10/18/19 1140    Visit Number 10    Number of Visits 16    Date for OT Re-Evaluation 10/31/19   Mini-reassessment completed 10/14/19   Authorization Type [employer] Medicare; $35 copay    Progress Note Due on Visit 10    OT Start Time 1117    OT Stop Time 1158    OT Time Calculation (min) 41 min    Activity Tolerance Patient tolerated treatment well    Behavior During Therapy WFL for tasks assessed/performed           Past Medical History:  Diagnosis Date  . Arthritis    lumbar region, spondylolisthesis   . Hypercholesteremia     Past Surgical History:  Procedure Laterality Date  . BACK SURGERY    . KNEE ARTHROSCOPY WITH LATERAL MENISECTOMY Right 02/25/2019   Procedure: RIGHT KNEE ARTHROSCOPY WITH LATERAL MENISECTOMY;  Surgeon: Vickki Hearing, MD;  Location: [facility];  Service: Orthopedics;  Laterality: Right;  . SHOULDER ARTHROSCOPY WITH OPEN ROTATOR CUFF REPAIR Right 10/15/2018   Procedure: SHOULDER ARTHROSCOPY WITH OPEN ROTATOR CUFF REPAIR;  Surgeon: Vickki Hearing, MD;  Location: [facility];  Service: Orthopedics;  Laterality: Right;  . SHOULDER ARTHROSCOPY WITH ROTATOR CUFF REPAIR Left 08/19/2019   Procedure: LEFT SHOULDER ARTHROSCOPY WITH ROTATOR CUFF REPAIR;  Surgeon: Vickki Hearing, MD;  Location: [facility];  Service: Orthopedics;  Laterality: Left;  With scalene block   . TUBAL LIGATION      There were no vitals filed for this visit.   Subjective Assessment - 10/18/19 1120    Subjective   S: I used the weed eater this weekend, and I don't think that was smart.    Pertinent History Pt is a 69 y/o female s/p left shoulder arthroscopic RCR on 08/19/19. Pt presents in sling, reports she has been sore but feels it is doing well. Pt was referred to occupational therapy for evaluation and treatment by Dr. Fuller Canada.    Currently in Pain? Yes    Pain Score 6     Pain Location Shoulder    Pain Orientation Left    Pain Descriptors / Indicators Aching;Sore              [facility] OT Assessment - 10/18/19 1121      Assessment   Medical Diagnosis s/p left shoulder arthroscopic RCR    Referring Provider (OT) Dr. Fuller Canada      Precautions   Precautions Shoulder    Type of Shoulder Precautions weeks 1-4 (5/13-6/10) P/ROM, pendulums, isometrics, scapular A/ROM; weeks 4-12 (6/10-8/5) AA/ROM and progress as tolerated. Week 12+ strengthening                     OT Treatments/Exercises (OP) - 10/18/19 1128      Exercises   Exercises Shoulder      Shoulder Exercises: Supine   Horizontal ABduction PROM;5 reps    External Rotation PROM;5 reps    Internal Rotation PROM;5 reps    Flexion PROM;5 reps  ABduction PROM;5 reps      Shoulder Exercises: Seated   Row AAROM;10 reps   Green therapy ball   Horizontal ABduction AAROM;10 reps    External Rotation AAROM;10 reps   Green therapy ball   Internal Rotation AAROM;10 reps   Green therapy ball   Flexion AAROM;10 reps   Dowel     Shoulder Exercises: Pulleys   Flexion --    ABduction --      Shoulder Exercises: Therapy Ball   Flexion 10 reps    ABduction 10 reps      Shoulder Exercises: ROM/Strengthening   UBE (Upper Arm Bike) 2' forward. 2' backwards. pace 6    Wall Wash 10x flexion; 10x abduction    Thumb Tacks 10x shoulder level    "W" Arms 10X    Ball on Wall 1' without ball      Shoulder Exercises: Stretch   Cross Chest Stretch 5 reps;10 seconds    Internal Rotation Stretch --    External  Rotation Stretch --    Wall Stretch - Flexion --    Wall Stretch - ABduction --    Other Shoulder Stretches door frame stretch L arm hold and then torso twist out to right 5 reps for 10 sec      Modalities   Modalities Moist Heat      Moist Heat Therapy   Number Minutes Moist Heat 3 Minutes    Moist Heat Location Shoulder      Manual Therapy   Manual Therapy Myofascial release    Manual therapy comments manual therapy completed separately from therapeutic exercises    Myofascial Release myofascial release and manual techniques completed to left upper arm, trapezius, and scapular regions to decrease pain and fascial restrictions and increase joint ROM                    OT Short Term Goals - 09/10/19 1012      OT SHORT TERM GOAL #1   Title Pt will be provided with and educated on HEP to improve ability to use LUE as non-dominant during ADLs.    Time 4    Period Weeks    Status On-going    Target Date 10/01/19      OT SHORT TERM GOAL #2   Title Pt will increase LUE P/ROM to [facility] to improve ability to perform dressing tasks with minimal compensatory strategies.    Time 4    Period Weeks    Status On-going      OT SHORT TERM GOAL #3   Title Pt will increase LUE strength to 3/5 to improve ability to reach items at waist to chest height during ADL completion.    Time 4    Period Weeks    Status On-going             OT Long Term Goals - 09/10/19 1013      OT LONG TERM GOAL #1   Title Patient will return to highest level of independence while using her LUE for all daily and household tasks.    Time 8    Period Weeks    Status On-going      OT LONG TERM GOAL #2   Title Patient will increase LUE A/ROM to [facility] in order to reach into overhead cabinets with less difficulty.    Time 8    Period Weeks    Status On-going      OT LONG TERM GOAL #3  Title Patient will decrease fascial restrictions in LUE to min amount or less in order to increase functional mobilty  needed to complete reaching and dressing tasks.    Time 8    Period Weeks    Status On-going      OT LONG TERM GOAL #4   Title Patient will increase her LUE strength to 4+/5 in order to return to completing her required yardwork tasks.    Time 8    Period Weeks    Status On-going      OT LONG TERM GOAL #5   Title Patient will report a decrease in pain of approximately 3/10 or less when completing daily and household chores using her LUE.    Time 8    Period Weeks    Status On-going                 Plan - 10/18/19 1138    Clinical Impression Statement A: Pt reporting increased pain today compared to her normal after sending time using the weed eater this weekend. Focused session on progressing from AAROM to AROM with proximal strengthening. Performing AAROM with green therapy ball, wall slides, wall lift offs, wall circles, and UE bike. Incorporating periodic stretches due to pt soreness as well as ending with moist heat. Cues provided thorughout for form and technique.    Body Structure / Function / Physical Skills ADL;Endurance;UE functional use;Fascial restriction;Pain;Flexibility;ROM;IADL;Strength    OT Frequency 1x / week    Plan P: Continue protocol. Focus on AAROM progressing to AROM and proximal strengthening. Per MD recommendation, will reduce to once a week.    OT Home Exercise Plan eval: table slides. 6/11 isometric exercises. 6/21 AAROM with dowel. 7/8 shoulder stretches.           Patient will benefit from skilled therapeutic intervention in order to improve the following deficits and impairments:   Body Structure / Function / Physical Skills: ADL, Endurance, UE functional use, Fascial restriction, Pain, Flexibility, ROM, IADL, Strength       Visit Diagnosis: Acute pain of left shoulder  Other symptoms and signs involving the musculoskeletal system    Problem List Patient Active Problem List   Diagnosis Date Noted  . S/P arthroscopy of left shoulder  RCR 08/19/19 08/25/2019  . Nontraumatic complete tear of left rotator cuff   . S/P right knee arthroscopy 02/25/2019 03/02/2019  . Complex tear of lateral meniscus of right knee as current injury   . Status post rotator cuff repair 10/15/2018 12/21/2018  . Biceps tendon rupture, proximal, right, subsequent encounter   . Primary osteoarthritis of right shoulder   . Labral tear of shoulder, degenerative, right   . Synovitis of right shoulder   . Spondylolisthesis at L3-L4 level 12/10/2016  . Bursitis, shoulder 02/24/2013  . HYPERLIPIDEMIA 02/04/2007  . WRIST SPRAIN, LEFT 02/04/2007  . MIGRAINE HEADACHE 01/15/2007  . BRONCHITIS, CHRONIC NOS 01/15/2007  . CONSTIPATION 01/15/2007  . IBS 01/15/2007  . DISC DISEASE, LUMBOSACRAL SPINE 01/15/2007  . LOW BACK PAIN, CHRONIC 01/15/2007  . MALAISE AND FATIGUE 01/15/2007    Gabriel Rung, MSOT, OTR/L 10/18/2019, 12:14 PM  [facility] [address] [place], [place], [address] Phone: [phone]   Fax:  [phone]  Name: Kathryn Woods MRN: [id number] Date of Birth: [birth year]

## 2019-10-20 ENCOUNTER — Encounter (HOSPITAL_COMMUNITY): Payer: Medicare Other | Admitting: Specialist

## 2019-10-25 ENCOUNTER — Ambulatory Visit (HOSPITAL_COMMUNITY): Payer: Medicare Other | Admitting: Occupational Therapy

## 2019-10-25 ENCOUNTER — Other Ambulatory Visit: Payer: Self-pay

## 2019-10-25 ENCOUNTER — Encounter (HOSPITAL_COMMUNITY): Payer: Self-pay | Admitting: Occupational Therapy

## 2019-10-25 DIAGNOSIS — R29898 Other symptoms and signs involving the musculoskeletal system: Secondary | ICD-10-CM

## 2019-10-25 DIAGNOSIS — M25512 Pain in left shoulder: Secondary | ICD-10-CM

## 2019-10-25 NOTE — Therapy (Signed)
[facility] [address] [place], [place], [address] Phone: [phone]   Fax:  [phone]  Occupational Therapy Treatment  Patient Details  Name: Kathryn Woods MRN: [id number] Date of Birth: 1950-06-08 Referring Provider (OT): Dr. Fuller Canada   Encounter Date: 10/25/2019   OT End of Session - 10/25/19 1503    Visit Number 11    Number of Visits 16    Date for OT Re-Evaluation 10/31/19   Mini-reassessment completed 10/14/19   Authorization Type [employer] Medicare; $35 copay    Progress Note Due on Visit 20    OT Start Time 1302    OT Stop Time 1345    OT Time Calculation (min) 43 min    Activity Tolerance Patient tolerated treatment well    Behavior During Therapy [facility] for tasks assessed/performed           Past Medical History:  Diagnosis Date  . Arthritis    lumbar region, spondylolisthesis   . Hypercholesteremia     Past Surgical History:  Procedure Laterality Date  . BACK SURGERY    . KNEE ARTHROSCOPY WITH LATERAL MENISECTOMY Right 02/25/2019   Procedure: RIGHT KNEE ARTHROSCOPY WITH LATERAL MENISECTOMY;  Surgeon: Vickki Hearing, MD;  Location: [facility];  Service: Orthopedics;  Laterality: Right;  . SHOULDER ARTHROSCOPY WITH OPEN ROTATOR CUFF REPAIR Right 10/15/2018   Procedure: SHOULDER ARTHROSCOPY WITH OPEN ROTATOR CUFF REPAIR;  Surgeon: Vickki Hearing, MD;  Location: [facility];  Service: Orthopedics;  Laterality: Right;  . SHOULDER ARTHROSCOPY WITH ROTATOR CUFF REPAIR Left 08/19/2019   Procedure: LEFT SHOULDER ARTHROSCOPY WITH ROTATOR CUFF REPAIR;  Surgeon: Vickki Hearing, MD;  Location: [facility];  Service: Orthopedics;  Laterality: Left;  With scalene block   . TUBAL LIGATION      There were no vitals filed for this visit.   Subjective Assessment - 10/25/19 1304    Subjective  S: "Its been hurting everyday, since I used that weed eater."    Pertinent History Pt is a 69 y/o female s/p left shoulder  arthroscopic RCR on 08/19/19. Pt presents in sling, reports she has been sore but feels it is doing well. Pt was referred to occupational therapy for evaluation and treatment by Dr. Fuller Canada.    Currently in Pain? Yes    Pain Score 5     Pain Location Shoulder    Pain Orientation Left              [facility] OT Assessment - 10/25/19 1319      Assessment   Medical Diagnosis s/p left shoulder arthroscopic RCR    Referring Provider (OT) Dr. Fuller Canada      Precautions   Precautions Shoulder    Type of Shoulder Precautions weeks 1-4 (5/13-6/10) P/ROM, pendulums, isometrics, scapular A/ROM; weeks 4-12 (6/10-8/5) AA/ROM and progress as tolerated. Week 12+ strengthening                     OT Treatments/Exercises (OP) - 10/25/19 1317      Exercises   Exercises Shoulder      Shoulder Exercises: Supine   Horizontal ABduction PROM;5 reps;AAROM;10 reps   1# bar   External Rotation PROM;5 reps;AAROM;10 reps   1# bar   Internal Rotation PROM;5 reps;AAROM;10 reps   1# bar   Flexion PROM;5 reps;AAROM;10 reps   1# bar   ABduction PROM;5 reps;AAROM;10 reps   1# bar     Shoulder Exercises: ROM/Strengthening  Wall Wash 10x flexion; 10x abduction    "W" Arms 10X    Proximal Shoulder Strengthening, Supine 1' each; rest breaks between    Other ROM/Strengthening Exercises Tennis ball pass behind back; both directions 10 x      Shoulder Exercises: Stretch   Cross Chest Stretch 5 reps;10 seconds      Functional Reaching Activities   Mid Level 10 cones. abduction    High Level 10 cones. forward flexion                    OT Short Term Goals - 09/10/19 1012      OT SHORT TERM GOAL #1   Title Pt will be provided with and educated on HEP to improve ability to use LUE as non-dominant during ADLs.    Time 4    Period Weeks    Status On-going    Target Date 10/01/19      OT SHORT TERM GOAL #2   Title Pt will increase LUE P/ROM to [facility] to improve ability to  perform dressing tasks with minimal compensatory strategies.    Time 4    Period Weeks    Status On-going      OT SHORT TERM GOAL #3   Title Pt will increase LUE strength to 3/5 to improve ability to reach items at waist to chest height during ADL completion.    Time 4    Period Weeks    Status On-going             OT Long Term Goals - 09/10/19 1013      OT LONG TERM GOAL #1   Title Patient will return to highest level of independence while using her LUE for all daily and household tasks.    Time 8    Period Weeks    Status On-going      OT LONG TERM GOAL #2   Title Patient will increase LUE A/ROM to [facility] in order to reach into overhead cabinets with less difficulty.    Time 8    Period Weeks    Status On-going      OT LONG TERM GOAL #3   Title Patient will decrease fascial restrictions in LUE to min amount or less in order to increase functional mobilty needed to complete reaching and dressing tasks.    Time 8    Period Weeks    Status On-going      OT LONG TERM GOAL #4   Title Patient will increase her LUE strength to 4+/5 in order to return to completing her required yardwork tasks.    Time 8    Period Weeks    Status On-going      OT LONG TERM GOAL #5   Title Patient will report a decrease in pain of approximately 3/10 or less when completing daily and household chores using her LUE.    Time 8    Period Weeks    Status On-going                 Plan - 10/25/19 1339    Clinical Impression Statement A: Continued myofascial and PROM. Pt reporting increased pain and stiffness since two weekends ago when she used a weed eater in the yard. Continued AAROM with 1# dowel and wall washes. Focusing session on functional reach and proxmial strengthening with functional reach, UB bike, and wall lifts. Pt reporting less pain at end of session. Cues throughout for form and technique  Body Structure / Function / Physical Skills ADL;Endurance;UE functional use;Fascial  restriction;Pain;Flexibility;ROM;IADL;Strength    OT Frequency 1x / week    Plan P: Continue protocol. Focus on AAROM progressing to AROM and proximal strengthening. Add over head lacing.    OT Home Exercise Plan eval: table slides. 6/11 isometric exercises. 6/21 AAROM with dowel. 7/8 shoulder stretches.           Patient will benefit from skilled therapeutic intervention in order to improve the following deficits and impairments:   Body Structure / Function / Physical Skills: ADL, Endurance, UE functional use, Fascial restriction, Pain, Flexibility, ROM, IADL, Strength       Visit Diagnosis: Acute pain of left shoulder  Other symptoms and signs involving the musculoskeletal system    Problem List Patient Active Problem List   Diagnosis Date Noted  . S/P arthroscopy of left shoulder RCR 08/19/19 08/25/2019  . Nontraumatic complete tear of left rotator cuff   . S/P right knee arthroscopy 02/25/2019 03/02/2019  . Complex tear of lateral meniscus of right knee as current injury   . Status post rotator cuff repair 10/15/2018 12/21/2018  . Biceps tendon rupture, proximal, right, subsequent encounter   . Primary osteoarthritis of right shoulder   . Labral tear of shoulder, degenerative, right   . Synovitis of right shoulder   . Spondylolisthesis at L3-L4 level 12/10/2016  . Bursitis, shoulder 02/24/2013  . HYPERLIPIDEMIA 02/04/2007  . WRIST SPRAIN, LEFT 02/04/2007  . MIGRAINE HEADACHE 01/15/2007  . BRONCHITIS, CHRONIC NOS 01/15/2007  . CONSTIPATION 01/15/2007  . IBS 01/15/2007  . DISC DISEASE, LUMBOSACRAL SPINE 01/15/2007  . LOW BACK PAIN, CHRONIC 01/15/2007  . MALAISE AND FATIGUE 01/15/2007    Gabriel Rung, MSOT, OTR/L 10/25/2019, 4:20 PM  [facility] [address] [place], [place], [address] Phone: [phone]   Fax:  [phone]  Name: Kathryn Woods MRN: [id number] Date of Birth: 02-11-1951

## 2019-10-27 ENCOUNTER — Encounter (HOSPITAL_COMMUNITY): Payer: Medicare Other | Admitting: Specialist

## 2019-10-28 ENCOUNTER — Telehealth: Payer: Self-pay | Admitting: Radiology

## 2019-10-28 ENCOUNTER — Other Ambulatory Visit: Payer: Self-pay | Admitting: Orthopedic Surgery

## 2019-10-28 DIAGNOSIS — G8918 Other acute postprocedural pain: Secondary | ICD-10-CM

## 2019-10-28 MED ORDER — HYDROCODONE-ACETAMINOPHEN 5-325 MG PO TABS: 1.0000 | ORAL_TABLET | Freq: Four times a day (QID) | ORAL | 0 refills | Status: DC | PRN

## 2019-10-28 NOTE — Telephone Encounter (Signed)
Patient called, requests Rx hydrocodone sent to [facility].

## 2019-10-28 NOTE — Progress Notes (Signed)
?  Meds ordered this encounter  ?Medications  ? HYDROcodone-acetaminophen (NORCO/VICODIN) 5-325 MG tablet  ?  Sig: Take 1 tablet by mouth every 6 (six) hours as needed for moderate pain.  ?  Dispense:  30 tablet  ?  Refill:  0  ? ? ?

## 2019-11-22 ENCOUNTER — Encounter: Payer: Self-pay | Admitting: Orthopedic Surgery

## 2019-11-22 ENCOUNTER — Ambulatory Visit (INDEPENDENT_AMBULATORY_CARE_PROVIDER_SITE_OTHER): Payer: Medicare Other | Admitting: Orthopedic Surgery

## 2019-11-22 ENCOUNTER — Other Ambulatory Visit: Payer: Self-pay

## 2019-11-22 DIAGNOSIS — Z9889 Other specified postprocedural states: Secondary | ICD-10-CM

## 2019-11-22 DIAGNOSIS — G8918 Other acute postprocedural pain: Secondary | ICD-10-CM

## 2019-11-22 MED ORDER — HYDROCODONE-ACETAMINOPHEN 5-325 MG PO TABS: 1.0000 | ORAL_TABLET | Freq: Four times a day (QID) | ORAL | 0 refills | Status: AC | PRN

## 2019-11-22 NOTE — Progress Notes (Signed)
POSTOP VISIT   There were no vitals taken for this visit.  Encounter Diagnoses  Name Primary?  . S/P arthroscopy of left shoulder RCR 08/19/19 Yes  . Post-op pain      Chief Complaint  Patient presents with  . Routine Post Op    08/19/19 left shoudler Arthroscopy     HPI  69 years old 3 months after cuff repair full forward elevation soreness both shoulders also had right shoulder repaired in the past  Complains of soreness bilaterally  Excellent range of motion and strength  We gave her her prescription for opioid with precautions  Follow-up as needed  Postoperative plan (Work, [employer],  Meds ordered this encounter  Medications  . HYDROcodone-acetaminophen (NORCO/VICODIN) 5-325 MG tablet    Sig: Take 1 tablet by mouth every 6 (six) hours as needed for up to 5 days for moderate pain.    Dispense:  20 tablet    Refill:  0  ,FU)

## 2019-11-23 NOTE — Progress Notes (Signed)
Keep post op when its that close I dont bill as a ov

## 2019-12-08 ENCOUNTER — Encounter (HOSPITAL_COMMUNITY): Payer: Self-pay | Admitting: Occupational Therapy

## 2019-12-08 NOTE — Therapy (Signed)
[place], [place], [address] Phone: [phone]   Fax:  [phone]  Patient Details  Name: Kathryn Woods MRN: [id number] Date of Birth: 1950-07-23 Referring Provider:  No ref. provider found  Encounter Date: 12/08/2019   OCCUPATIONAL THERAPY DISCHARGE SUMMARY  Visits from Start of Care: 11  Current functional level related to goals / functional outcomes: Unknown. Pt has not returned to therapy since last treatment visit on 10/25/19. Pt will be discharged at this time and will need a new referral to return for therapy services.    Remaining deficits: Unknown.   Education / Equipment: HEP Plan: Patient agrees to discharge.  Patient goals were partially met. Patient is being discharged due to not returning since the last visit.  ?????      Guadelupe Sabin, OTR/L  [phone] 12/08/2019, 11:58 AM  [place], [place], [address] Phone: [phone]   Fax:  [phone]

## 2019-12-14 ENCOUNTER — Telehealth: Payer: Self-pay | Admitting: Orthopedic Surgery

## 2019-12-14 NOTE — Telephone Encounter (Signed)
Call received from patient via voice message, asking if Dr Romeo Apple would prescribe something for the pain in her arm? I called back to verify information; received voice mail, left message.

## 2019-12-15 NOTE — Telephone Encounter (Signed)
Is this new pain or post op pain for her shoulder surgery   If post op call pharmacy and go thru normal mechanisms

## 2019-12-15 NOTE — Telephone Encounter (Signed)
She has right shoulder/ left one better She needs appointment for this Okey Regal, call with appointment Dr Romeo Apple she would like hydrocodone/ pended

## 2020-01-20 ENCOUNTER — Ambulatory Visit: Payer: Medicare Other

## 2020-01-20 ENCOUNTER — Ambulatory Visit (INDEPENDENT_AMBULATORY_CARE_PROVIDER_SITE_OTHER): Payer: Medicare Other | Admitting: Orthopedic Surgery

## 2020-01-20 ENCOUNTER — Other Ambulatory Visit: Payer: Self-pay

## 2020-01-20 ENCOUNTER — Encounter: Payer: Self-pay | Admitting: Orthopedic Surgery

## 2020-01-20 VITALS — BP 145/97 | HR 79 | Ht 66.0 in | Wt 175.0 lb

## 2020-01-20 DIAGNOSIS — Z9889 Other specified postprocedural states: Secondary | ICD-10-CM | POA: Diagnosis not present

## 2020-01-20 DIAGNOSIS — M25511 Pain in right shoulder: Secondary | ICD-10-CM

## 2020-01-20 DIAGNOSIS — G8929 Other chronic pain: Secondary | ICD-10-CM | POA: Diagnosis not present

## 2020-01-20 MED ORDER — HYDROCODONE-ACETAMINOPHEN 5-325 MG PO TABS: 1.0000 | ORAL_TABLET | ORAL | 0 refills | Status: AC | PRN

## 2020-01-20 NOTE — Addendum Note (Signed)
Addended byCaffie Damme on: 01/20/2020 03:08 PM   Modules accepted: Orders

## 2020-01-20 NOTE — Patient Instructions (Addendum)
MRI WITH AND WITHOUT RIGHT SHOULDER JOINT  Meds ordered this encounter  Medications  . HYDROcodone-acetaminophen (NORCO/VICODIN) 5-325 MG tablet    Sig: Take 1 tablet by mouth every 4 (four) hours as needed for up to 5 days for severe pain.    Dispense:  30 tablet    Refill:  0   Call [facility] to schedule the MRI scan [phone] you will have to get contrast injection for the scan

## 2020-01-20 NOTE — Progress Notes (Signed)
Chief Complaint  Patient presents with  . Shoulder Pain    right shoulder surgery 10/15/18  STILL HAVING PAIN   . Routine Post Op    left shoulder surgery 08/19/19 still painful also    69 year old female complains of right shoulder pain giving way popping weakness  Right shoulder surgery was done October 15, 2018 still having pain requiring opioid therapy  She tried some home measures including creams rubs no relief  No new injury   PRE-OPERATIVE DIAGNOSIS:  TORN ROTATOR CUFF RIGHT / BICEPS TEAR RIGHT SHOULDER   POST-OPERATIVE DIAGNOSIS:  TORN ROTATOR CUFF RIGHT / BICEPS TEAR RIGHT SHOULDER   Operative findings Glenohumeral joint  Mild arthrosis with 2 areas of chondral defect grade 3 on the glenoid side humeral head was normal Degenerative tearing of the labrum anteriorly and posteriorly Synovitis Torn biceps tendon with retraction   Subacromial space Extensive bursitis rotator cuff tear with retraction just past the midpoint of the humeral head 2.5 cm but easily reducible with external rotation of the arm Rotator cuff tear front to back 2 cm Acromion spurring   PROCEDURE:  Procedure(s): SHOULDER ARTHROSCOPY LIMITED DEBRIDEMENT 29 822, WITH OPEN ROTATOR CUFF REPAIR 37858 (Right)  BP (!) 145/97   Pulse 79   Ht 5\' 6"  (1.676 m)   Wt 175 lb (79.4 kg)   BMI 28.25 kg/m    Physical Exam Constitutional:      Appearance: Normal appearance.  Skin:    General: Skin is warm and dry.  Neurological:     General: No focal deficit present.     Mental Status: She is alert and oriented to person, place, and time.  Psychiatric:        Mood and Affect: Mood normal.        Thought Content: Thought content normal.     Ortho exam  Right shoulder examination reveals popping and clicking on abduction limited abduction flexion normal external rotation with the arm at the side mild weakness is noted in the rotator cuff  X-ray shows  Recommend MRI with contrast to evaluate for retear  rotator cuff   Meds ordered this encounter  Medications  . HYDROcodone-acetaminophen (NORCO/VICODIN) 5-325 MG tablet    Sig: Take 1 tablet by mouth every 4 (four) hours as needed for up to 5 days for severe pain.    Dispense:  30 tablet    Refill:  0

## 2020-01-31 ENCOUNTER — Telehealth: Payer: Self-pay | Admitting: Orthopedic Surgery

## 2020-01-31 NOTE — Telephone Encounter (Signed)
Call received via voice mail/call returned; reached voice mail - left message.

## 2020-01-31 NOTE — Telephone Encounter (Signed)
Patient called back and was given the number to schedule the hip injection. No other concerns.

## 2020-02-10 ENCOUNTER — Other Ambulatory Visit: Payer: Self-pay | Admitting: Orthopedic Surgery

## 2020-02-10 ENCOUNTER — Ambulatory Visit (HOSPITAL_COMMUNITY)
Admission: RE | Admit: 2020-02-10 | Discharge: 2020-02-10 | Disposition: A | Discharge status: Home / Self Care | Payer: Medicare Other | Source: Ambulatory Visit | Attending: Orthopedic Surgery | Admitting: Orthopedic Surgery

## 2020-02-10 ENCOUNTER — Other Ambulatory Visit: Payer: Self-pay

## 2020-02-10 DIAGNOSIS — G8929 Other chronic pain: Secondary | ICD-10-CM

## 2020-02-10 DIAGNOSIS — M25511 Pain in right shoulder: Secondary | ICD-10-CM | POA: Diagnosis not present

## 2020-02-10 DIAGNOSIS — Z9889 Other specified postprocedural states: Secondary | ICD-10-CM | POA: Diagnosis present

## 2020-02-10 MED ORDER — GADOBUTROL 1 MMOL/ML IV SOLN
2.0000 mL | Freq: Once | INTRAVENOUS | Status: AC | PRN
  Administered 2020-02-10: 0.05 mL

## 2020-02-10 MED ORDER — SODIUM CHLORIDE (PF) 0.9 % IJ SOLN
INTRAMUSCULAR | Status: AC
  Administered 2020-02-10: 5 mL
  Filled 2020-02-10: qty 10

## 2020-02-10 MED ORDER — POVIDONE-IODINE 10 % EX SOLN
CUTANEOUS | Status: AC
  Administered 2020-02-10: 1
  Filled 2020-02-10: qty 15

## 2020-02-10 MED ORDER — LIDOCAINE HCL (PF) 1 % IJ SOLN
INTRAMUSCULAR | Status: AC
  Administered 2020-02-10: 5 mL
  Filled 2020-02-10: qty 5

## 2020-02-10 MED ORDER — IOHEXOL 180 MG/ML  SOLN
20.0000 mL | Freq: Once | INTRAMUSCULAR | Status: AC | PRN
  Administered 2020-02-10: 15 mL via INTRA_ARTICULAR

## 2020-02-10 NOTE — Telephone Encounter (Signed)
Patient has had her MRI done today, 02/10/20, and has a follow up appointment scheduled with Dr Romeo Apple Monday, 02/14/20, next clinic day. Patient is requesting refill: HYDROcodone-acetaminophen (NORCO/VICODIN) 5-325 MG tablet 30 tablet  -[facility] [place]*  *reviewed refill policy after 12:00 noon on Thursdays

## 2020-02-11 MED ORDER — HYDROCODONE-ACETAMINOPHEN 5-325 MG PO TABS
1.0000 | ORAL_TABLET | Freq: Four times a day (QID) | ORAL | 0 refills | Status: DC | PRN
Start: 2020-02-11 — End: 2020-03-06

## 2020-02-14 ENCOUNTER — Other Ambulatory Visit: Payer: Self-pay

## 2020-02-14 ENCOUNTER — Ambulatory Visit (INDEPENDENT_AMBULATORY_CARE_PROVIDER_SITE_OTHER): Payer: Medicare Other | Admitting: Orthopedic Surgery

## 2020-02-14 VITALS — BP 146/97 | HR 75 | Resp 16 | Ht 66.0 in | Wt 178.4 lb

## 2020-02-14 DIAGNOSIS — M75111 Incomplete rotator cuff tear or rupture of right shoulder, not specified as traumatic: Secondary | ICD-10-CM

## 2020-02-14 DIAGNOSIS — M249 Joint derangement, unspecified: Secondary | ICD-10-CM | POA: Diagnosis not present

## 2020-02-14 NOTE — Progress Notes (Signed)
  Chief Complaint  Patient presents with  . Review MRI results   69 year old had a rotator cuff repair in 2020 continue to have pain was sent for intra-articular contrast MRI which showed her recurrent partial if not full-thickness cuff tear with severe arthritis  Presented options to her for surgery or physical therapy or injection she would like to try intra-articular injection of Depo-Medrol lidocaine  I have reviewed the MRI and the report my interpretation is that the rotator cuff is partially torn and she has severe glenohumeral arthritis  Encounter Diagnoses  Name Primary?  . Shoulder joint derangement Yes  . Nontraumatic incomplete tear of right rotator cuff

## 2020-02-14 NOTE — Patient Instructions (Signed)
Schedule right shoulder intra articular joint injection

## 2020-02-15 ENCOUNTER — Telehealth: Payer: Self-pay | Admitting: Radiology

## 2020-02-15 DIAGNOSIS — G8929 Other chronic pain: Secondary | ICD-10-CM

## 2020-02-15 NOTE — Telephone Encounter (Signed)
-----   Message from Vickki Hearing, MD sent at 02/14/2020  9:38 AM EST ----- Right shoulder IA joint injection depo and lidocaine

## 2020-02-15 NOTE — Telephone Encounter (Signed)
Put in order will call to schedule

## 2020-02-15 NOTE — Telephone Encounter (Signed)
Called to schedule  Right shoulder injection  [facility].  10 am on Friday 12th November  Left message for her to call me back let me know

## 2020-02-18 ENCOUNTER — Ambulatory Visit (HOSPITAL_COMMUNITY)
Admission: RE | Admit: 2020-02-18 | Discharge: 2020-02-18 | Disposition: A | Discharge status: Home / Self Care | Payer: Medicare Other | Source: Ambulatory Visit | Attending: Orthopedic Surgery | Admitting: Orthopedic Surgery

## 2020-02-18 ENCOUNTER — Other Ambulatory Visit: Payer: Self-pay

## 2020-02-18 DIAGNOSIS — M25511 Pain in right shoulder: Secondary | ICD-10-CM | POA: Diagnosis present

## 2020-02-18 DIAGNOSIS — G8929 Other chronic pain: Secondary | ICD-10-CM | POA: Diagnosis present

## 2020-02-18 MED ORDER — POVIDONE-IODINE 10 % EX SOLN
CUTANEOUS | Status: AC
  Administered 2020-02-18: 1
  Filled 2020-02-18: qty 15

## 2020-02-18 MED ORDER — IOHEXOL 300 MG/ML  SOLN
30.0000 mL | Freq: Once | INTRAMUSCULAR | Status: AC | PRN
  Administered 2020-02-18: 2 mL via INTRA_ARTICULAR

## 2020-02-18 MED ORDER — METHYLPREDNISOLONE ACETATE 40 MG/ML IJ SUSP
INTRAMUSCULAR | Status: AC
  Administered 2020-02-18: 40 mg
  Filled 2020-02-18: qty 1

## 2020-02-18 MED ORDER — LIDOCAINE HCL (PF) 1 % IJ SOLN
INTRAMUSCULAR | Status: AC
  Administered 2020-02-18: 7 mL
  Filled 2020-02-18: qty 10

## 2020-02-18 NOTE — Procedures (Signed)
Preprocedure Dx: Chronic right shoulder pain  Postprocedure Dx: Chronic right shoulder pain Procedure  Fluoroscopically guided therapeutic RIGHT shoulder joint injection Radiologist:  Tyron Russell Anesthesia:  4 ml of 1% lidocaine Injectate:  40 mg DepoMedrol, 3 ml of 1% lidocaine Fluoro time:  0 minutes 24 seconds EBL:   None Complications: None

## 2020-03-06 ENCOUNTER — Other Ambulatory Visit: Payer: Self-pay | Admitting: Orthopedic Surgery

## 2020-03-06 MED ORDER — HYDROCODONE-ACETAMINOPHEN 5-325 MG PO TABS: 1.0000 | ORAL_TABLET | Freq: Four times a day (QID) | ORAL | 0 refills | Status: DC | PRN

## 2020-03-06 NOTE — Telephone Encounter (Signed)
Patient requests refill on Hydrocodone/Acetaminophen 5-325 mgs.  Qty  30      Sig: Take 1 tablet by mouth every 6 (six) hours as needed for moderate pain.   Patient states she uses Psychologist, forensic in [place]

## 2020-03-15 NOTE — Addendum Note (Signed)
Encounter addended by: Novella Olive on: 03/15/2020 12:06 PM  Actions taken: Imaging Exam ended

## 2020-03-15 NOTE — Addendum Note (Signed)
Encounter addended by: Novella Olive on: 03/15/2020 12:04 PM  Actions taken: Imaging Exam ended

## 2020-03-15 NOTE — Addendum Note (Signed)
Encounter addended by: Novella Olive on: 03/15/2020 12:07 PM  Actions taken: Imaging Exam ended

## 2020-03-15 NOTE — Addendum Note (Signed)
Encounter addended by: Novella Olive on: 03/15/2020 12:07 PM  Actions taken: Imaging Exam ended, Charge Capture section accepted

## 2020-03-28 ENCOUNTER — Other Ambulatory Visit: Payer: Self-pay | Admitting: Orthopedic Surgery

## 2020-03-28 NOTE — Telephone Encounter (Signed)
Patient called via voice message, asking if Dr Romeo Apple could refill her pain medication 'for the holidays': HYDROcodone-acetaminophen (NORCO/VICODIN) 5-325 MG tablet 28 tablet  [facility], [place]

## 2020-03-29 ENCOUNTER — Other Ambulatory Visit: Payer: Self-pay | Admitting: Orthopedic Surgery

## 2020-03-29 DIAGNOSIS — G8929 Other chronic pain: Secondary | ICD-10-CM

## 2020-03-29 MED ORDER — HYDROCODONE-ACETAMINOPHEN 5-325 MG PO TABS: 1.0000 | ORAL_TABLET | Freq: Three times a day (TID) | ORAL | 0 refills | Status: AC | PRN

## 2020-03-29 NOTE — Progress Notes (Signed)
69 year old female with chronic right shoulder pain status post rotator cuff surgery which failed to relieve all of her pain  She also had a left rotator cuff repair which did well  She is also being treated for chronic back pain  She has been on chronic opioid therapy now for over a year and is at risk for chronic opioid dependence  Patient will need to consider chronic pain management    Meds ordered this encounter  Medications  . HYDROcodone-acetaminophen (NORCO/VICODIN) 5-325 MG tablet    Sig: Take 1 tablet by mouth every 8 (eight) hours as needed for up to 5 days for moderate pain or severe pain.    Dispense:  15 tablet    Refill:  0

## 2020-04-11 ENCOUNTER — Encounter (HOSPITAL_COMMUNITY): Payer: Self-pay | Admitting: *Deleted

## 2020-04-11 ENCOUNTER — Emergency Department (HOSPITAL_COMMUNITY)
Admission: EM | Admit: 2020-04-11 | Discharge: 2020-04-12 | Disposition: A | Discharge status: Home / Self Care | Payer: Medicare Other | Attending: Emergency Medicine | Admitting: Emergency Medicine

## 2020-04-11 ENCOUNTER — Other Ambulatory Visit: Payer: Self-pay

## 2020-04-11 DIAGNOSIS — M5416 Radiculopathy, lumbar region: Secondary | ICD-10-CM

## 2020-04-11 DIAGNOSIS — M545 Low back pain, unspecified: Secondary | ICD-10-CM | POA: Diagnosis present

## 2020-04-11 DIAGNOSIS — Z87891 Personal history of nicotine dependence: Secondary | ICD-10-CM | POA: Diagnosis not present

## 2020-04-11 NOTE — ED Triage Notes (Signed)
Right leg pain for a week, also c/o back pain

## 2020-04-12 ENCOUNTER — Emergency Department (HOSPITAL_COMMUNITY): Payer: Medicare Other

## 2020-04-12 MED ORDER — PREDNISONE 20 MG PO TABS: ORAL_TABLET | ORAL | 0 refills | Status: DC

## 2020-04-12 MED ORDER — METHOCARBAMOL 500 MG PO TABS
500.0000 mg | ORAL_TABLET | Freq: Once | ORAL | Status: AC
  Administered 2020-04-12: 500 mg via ORAL
  Filled 2020-04-12: qty 1

## 2020-04-12 MED ORDER — METHOCARBAMOL 500 MG PO TABS: ORAL_TABLET | ORAL | 0 refills | Status: DC

## 2020-04-12 MED ORDER — DEXAMETHASONE SODIUM PHOSPHATE 10 MG/ML IJ SOLN
10.0000 mg | Freq: Once | INTRAMUSCULAR | Status: AC
  Administered 2020-04-12: 10 mg via INTRAMUSCULAR
  Filled 2020-04-12: qty 1

## 2020-04-12 NOTE — ED Provider Notes (Signed)
[facility] EMERGENCY DEPARTMENT Provider Note   CSN: [id number] Arrival date & time: 04/11/20  1630   Time seen 12:35 AM  History Chief Complaint  Patient presents with  . Leg Pain    Kathryn Woods is a 70 y.o. female.  HPI   When I asked the patient why she is here she states "I have no idea".  She then tells me about a week ago she was in the kitchen and when she turned around to get something the right leg got very painful in the hip area and her leg gave out on her.  Since then she has had pain in her right hip area and she points to the actual right hip joint and states it gave out on her this evening and yesterday.  He gives out because of pain.  She also states her chronic low back pain is worse.  She states she has had 2 back surgeries done and she is followed by Dr. Romeo Apple for pain in surgery and her shoulders and her right knee.  She denies any urinary or rectal incontinence.  She denies history of diabetes.  When I review her chart she has been seen by [facility] pain medicine specialist, last seen November 16.  She has not contacted them to let them know she is having new problems.  They document several times in their note that they have encouraged her to let them know at any time if she is having a change in her symptoms.  PCP Pearson Grippe, MD Pain management [facility] pain medicine specialist,Dr Jake Church Orthopedist Dr. Romeo Apple   Past Medical History:  Diagnosis Date  . Arthritis    lumbar region, spondylolisthesis   . Hypercholesteremia     Patient Active Problem List   Diagnosis Date Noted  . S/P arthroscopy of left shoulder RCR 08/19/19 08/25/2019  . Nontraumatic complete tear of left rotator cuff   . S/P right knee arthroscopy 02/25/2019 03/02/2019  . Complex tear of lateral meniscus of right knee as current injury   . Status post rotator cuff repair 10/15/2018 12/21/2018  . Biceps tendon rupture, proximal, right, subsequent encounter   . Primary  osteoarthritis of right shoulder   . Labral tear of shoulder, degenerative, right   . Synovitis of right shoulder   . Spondylolisthesis at L3-L4 level 12/10/2016  . Bursitis, shoulder 02/24/2013  . HYPERLIPIDEMIA 02/04/2007  . WRIST SPRAIN, LEFT 02/04/2007  . MIGRAINE HEADACHE 01/15/2007  . BRONCHITIS, CHRONIC NOS 01/15/2007  . CONSTIPATION 01/15/2007  . IBS 01/15/2007  . DISC DISEASE, LUMBOSACRAL SPINE 01/15/2007  . LOW BACK PAIN, CHRONIC 01/15/2007  . MALAISE AND FATIGUE 01/15/2007    Past Surgical History:  Procedure Laterality Date  . BACK SURGERY    . KNEE ARTHROSCOPY WITH LATERAL MENISECTOMY Right 02/25/2019   Procedure: RIGHT KNEE ARTHROSCOPY WITH LATERAL MENISECTOMY;  Surgeon: Vickki Hearing, MD;  Location: [facility];  Service: Orthopedics;  Laterality: Right;  . SHOULDER ARTHROSCOPY WITH OPEN ROTATOR CUFF REPAIR Right 10/15/2018   Procedure: SHOULDER ARTHROSCOPY WITH OPEN ROTATOR CUFF REPAIR;  Surgeon: Vickki Hearing, MD;  Location: [facility];  Service: Orthopedics;  Laterality: Right;  . SHOULDER ARTHROSCOPY WITH ROTATOR CUFF REPAIR Left 08/19/2019   Procedure: LEFT SHOULDER ARTHROSCOPY WITH ROTATOR CUFF REPAIR;  Surgeon: Vickki Hearing, MD;  Location: [facility];  Service: Orthopedics;  Laterality: Left;  With scalene block   . TUBAL LIGATION       OB History   No obstetric  history on file.     Family History  Problem Relation Age of Onset  . Heart attack Mother   . Cancer Father        lung    Social History   Tobacco Use  . Smoking status: Former Smoker    Quit date: 12/06/1982    Years since quitting: 37.3  . Smokeless tobacco: Never Used  Vaping Use  . Vaping Use: Never used  Substance Use Topics  . Alcohol use: No  . Drug use: Yes    Types: Marijuana    Comment: a couple times- for pain relief, pt. informed to not use anymore before surgery     Home Medications Prior to Admission medications   Medication Sig Start Date End Date Taking?  Authorizing Provider  methocarbamol (ROBAXIN) 500 MG tablet Take 1 or 2 po Q 6hrs for pain 04/12/20  Yes Devoria Albe, MD  predniSONE (DELTASONE) 20 MG tablet Take 3 po QD x 3d , then 2 po QD x 3d then 1 po QD x 3d 04/12/20  Yes Devoria Albe, MD  Cyanocobalamin ([employer] B12 GUMMIES PO) Take 1 Dose by mouth daily.    [provider]  ibuprofen (ADVIL) 800 MG tablet Take 1 tablet (800 mg total) by mouth every 8 (eight) hours as needed. 08/19/19   Vickki Hearing, MD    Allergies    Aspirin  Review of Systems   Review of Systems  All other systems reviewed and are negative.   Physical Exam Updated Vital Signs BP (!) 153/96 (BP Location: Right Arm)   Pulse 83   Temp 98.2 F (36.8 C) (Oral)   Resp 17   Ht 5\' 6"  (1.676 m)   Wt 78.9 kg   SpO2 96%   BMI 28.08 kg/m   Physical Exam Vitals and nursing note reviewed.  Constitutional:      General: She is not in acute distress.    Appearance: Normal appearance. She is obese. She is not ill-appearing or toxic-appearing.  HENT:     Right Ear: External ear normal.  Eyes:     Extraocular Movements: Extraocular movements intact.     Conjunctiva/sclera: Conjunctivae normal.     Pupils: Pupils are equal, round, and reactive to light.  Cardiovascular:     Rate and Rhythm: Normal rate.  Pulmonary:     Effort: Pulmonary effort is normal. No respiratory distress.  Musculoskeletal:     Cervical back: Normal range of motion.     Comments: When I examine her back she is nontender in the lumbar spine or thoracic spine midline.  She complains of pain in the right paraspinous muscle/flank area into the right buttock area and then down into the actual right hip joint with some involvement of the right lateral hip area/greater trochanteric area.  She is not tender to palpation over the right SI joint or over the right sciatic notch.  She has mild pain on straight leg raising of the right leg, none on the left leg.  Her patellar reflexes are 2+ and  equal.  Skin:    General: Skin is warm and dry.  Neurological:     General: No focal deficit present.     Mental Status: She is alert and oriented to person, place, and time.     Cranial Nerves: No cranial nerve deficit.  Psychiatric:        Mood and Affect: Mood normal.        Behavior: Behavior normal.  Thought Content: Thought content normal.     ED Results / Procedures / Treatments   Labs (all labs ordered are listed, but only abnormal results are displayed) Labs Reviewed - No data to display  EKG None  Radiology DG Hip Unilat W or Wo Pelvis 2-3 Views Right  Result Date: 04/12/2020 CLINICAL DATA:  Right hip pain for 1 week, difficulty ambulating EXAM: DG HIP (WITH OR WITHOUT PELVIS) 2-3V RIGHT COMPARISON:  10/05/2018 FINDINGS: Frontal view of the pelvis as well as frontal and frogleg lateral views of the right hip are obtained. No fracture, subluxation, or dislocation. Stable bilateral hip osteoarthritis, right greater than left. Sacroiliac joints are normal. Stable postsurgical changes lower lumbar spine. IMPRESSION: 1. Stable bilateral hip osteoarthritis, right greater than left. 2. No acute bony abnormality. Electronically Signed   By: Randa Ngo M.D.   On: 04/12/2020 01:32    Procedures Procedures (including critical care time)  Medications Ordered in ED Medications  dexamethasone (DECADRON) injection 10 mg (10 mg Intramuscular Given 04/12/20 0149)  methocarbamol (ROBAXIN) tablet 500 mg (500 mg Oral Given 04/12/20 0149)    ED Course  I have reviewed the triage vital signs and the nursing notes.  Pertinent labs & imaging results that were available during my care of the patient were reviewed by me and considered in my medical decision making (see chart for details).    MDM Rules/Calculators/A&P                         Review of Dr. Ruthe Mannan note shows she has chronic low back pain and shoulder pain.  Review of the [facility] pain medicine specialist note from  November 16 shows they are seeing her for lumbar radiculopathy with pain in the left leg in the L4 distribution with paresthesias.  X-ray was done of her right hip to look for underlying arthritis and or subclinical fracture.  Patient was given Decadron IM for pain, this will help if she is having a herniated disc problem causing her right sided pain and she was given Robaxin.  Patient's x-ray does show underlying arthritis.  Patient also describes a flareup of her chronic back pain.  It also appears to be muscular in that it is mainly not in the midline but more in the muscular portion of the back.  She was discharged home with steroids and a muscle relaxer.  She was strongly encouraged to let her pain management doctor know about this flareup.  MRI is not available here at night and she has no red flags to warrant emergent MRI being done in [facility].  She appears to have a lumbar radiculopathy now in the lumbar L 1/2 distribution on the right.  Review the [place] shows patient gets hydrocodone 5/325 on a monthly basis prescribed by Dr. Aline Brochure and amounts of 15 to 30 tablets.  They were last filled on December 22.   Final Clinical Impression(s) / ED Diagnoses Final diagnoses:  Lumbar back pain with radiculopathy affecting right lower extremity    Rx / DC Orders ED Discharge Orders         Ordered    predniSONE (DELTASONE) 20 MG tablet        04/12/20 0148    methocarbamol (ROBAXIN) 500 MG tablet        04/12/20 0148         Plan discharge  Rolland Porter, MD, Barbette Or, MD 04/12/20 [phone]

## 2020-04-12 NOTE — ED Notes (Signed)
Pt transported to Xray. 

## 2020-04-12 NOTE — Discharge Instructions (Addendum)
Use ice and heat for comfort.  Take the medication as prescribed.  Please let your pain management doctor know about your new symptoms.  They may want to schedule you for a another MRI or consider injecting your back on the right side.

## 2020-04-20 ENCOUNTER — Other Ambulatory Visit: Payer: Self-pay

## 2020-04-20 ENCOUNTER — Ambulatory Visit (INDEPENDENT_AMBULATORY_CARE_PROVIDER_SITE_OTHER): Payer: Medicare Other | Admitting: Orthopedic Surgery

## 2020-04-20 ENCOUNTER — Encounter: Payer: Self-pay | Admitting: Orthopedic Surgery

## 2020-04-20 VITALS — BP 168/105 | HR 80 | Ht 66.0 in | Wt 172.0 lb

## 2020-04-20 DIAGNOSIS — G8929 Other chronic pain: Secondary | ICD-10-CM

## 2020-04-20 DIAGNOSIS — M545 Low back pain, unspecified: Secondary | ICD-10-CM

## 2020-04-20 DIAGNOSIS — M25551 Pain in right hip: Secondary | ICD-10-CM

## 2020-04-20 MED ORDER — PREDNISONE 10 MG (48) PO TBPK: ORAL_TABLET | Freq: Every day | ORAL | 0 refills | Status: DC

## 2020-04-20 MED ORDER — HYDROCODONE-ACETAMINOPHEN 5-325 MG PO TABS
1.0000 | ORAL_TABLET | Freq: Four times a day (QID) | ORAL | 0 refills | Status: DC | PRN
Start: 2020-04-20 — End: 2020-05-08

## 2020-04-20 NOTE — Progress Notes (Signed)
Kathryn Woods [facility]  04/20/2020  Body mass index is 27.76 kg/m.  ASSESSMENT AND PLAN:     Acute right hip pain with chronic lower back pain seems to be brought on by the hip problem seems to have a flexor tendon injury in the right hip  Meds ordered this encounter  Medications  . HYDROcodone-acetaminophen (NORCO/VICODIN) 5-325 MG tablet    Sig: Take 1 tablet by mouth every 6 (six) hours as needed for moderate pain.    Dispense:  30 tablet    Refill:  0  . predniSONE (STERAPRED UNI-PAK 48 TAB) 10 MG (48) TBPK tablet    Sig: Take by mouth daily.    Dispense:  48 tablet    Refill:  0    Patient is advised to use a cane come back in 2 weeks  Encounter Diagnoses  Name Primary?  . Acute right hip pain Yes  . Chronic right-sided low back pain without sciatica     Imaging [facility] dated April 12, 2020 I read this as X-rays of the pelvis and right hip show no fracture or dislocation she has stable arthritis in both hips    HISTORY SECTION :  Chief Complaint  Patient presents with  . Hip Pain    Right groin/ hip thigh painful    70 year old female with acute onset of right hip pain involving the right groin.  She is status post lumbar fusion.  She complains of pain in the front of the hip and groin which began when she turned and pivoted on the right leg.  Since that time she has also started having pain in the right side of her lower back and the right leg gives way frequently  She had x-rays in the ER she has stable mild arthritis in both hips with no acute fracture no evidence of avascular necrosis  She took some Deltasone from the ER on a 3-day taper with some improvement.  She is already started using a cane    Review of Systems  Constitutional: Negative for chills and fever.  Musculoskeletal: Positive for back pain and joint pain.  Neurological: Negative for tingling.     has a past medical history of Arthritis and Hypercholesteremia.   Past  Surgical History:  Procedure Laterality Date  . BACK SURGERY    . KNEE ARTHROSCOPY WITH LATERAL MENISECTOMY Right 02/25/2019   Procedure: RIGHT KNEE ARTHROSCOPY WITH LATERAL MENISECTOMY;  Surgeon: Vickki Hearing, MD;  Location: [facility];  Service: Orthopedics;  Laterality: Right;  . SHOULDER ARTHROSCOPY WITH OPEN ROTATOR CUFF REPAIR Right 10/15/2018   Procedure: SHOULDER ARTHROSCOPY WITH OPEN ROTATOR CUFF REPAIR;  Surgeon: Vickki Hearing, MD;  Location: [facility];  Service: Orthopedics;  Laterality: Right;  . SHOULDER ARTHROSCOPY WITH ROTATOR CUFF REPAIR Left 08/19/2019   Procedure: LEFT SHOULDER ARTHROSCOPY WITH ROTATOR CUFF REPAIR;  Surgeon: Vickki Hearing, MD;  Location: [facility];  Service: Orthopedics;  Laterality: Left;  With scalene block   . TUBAL LIGATION      Social History   Tobacco Use  . Smoking status: Former Smoker    Quit date: 12/06/1982    Years since quitting: 37.3  . Smokeless tobacco: Never Used  Vaping Use  . Vaping Use: Never used  Substance Use Topics  . Alcohol use: No  . Drug use: Yes    Types: Marijuana    Comment: a couple times- for pain relief, pt. informed to not use anymore before surgery  Family History  Problem Relation Age of Onset  . Heart attack Mother   . Cancer Father        lung      Allergies  Allergen Reactions  . Aspirin Other (See Comments)    "Messes stomach up real bad"     Current Outpatient Medications:  .  Cyanocobalamin ([employer]S B12 GUMMIES PO), Take 1 Dose by mouth daily., Disp: , Rfl:  .  HYDROcodone-acetaminophen (NORCO/VICODIN) 5-325 MG tablet, Take 1 tablet by mouth every 6 (six) hours as needed for moderate pain., Disp: 30 tablet, Rfl: 0 .  ibuprofen (ADVIL) 800 MG tablet, Take 1 tablet (800 mg total) by mouth every 8 (eight) hours as needed., Disp: 90 tablet, Rfl: 1 .  methocarbamol (ROBAXIN) 500 MG tablet, Take 1 or 2 po Q 6hrs for pain, Disp: 60 tablet, Rfl: 0 .  predniSONE (STERAPRED UNI-PAK 48 TAB) 10 MG  (48) TBPK tablet, Take by mouth daily., Disp: 48 tablet, Rfl: 0   PHYSICAL EXAM SECTION: BP (!) 168/105   Pulse 80   Ht 5\' 6"  (1.676 m)   Wt 172 lb (78 kg)   BMI 27.76 kg/m   Body mass index is 27.76 kg/m.   General appearance: Well-developed well-nourished no gross deformities  Eyes clear normal vision no evidence of conjunctivitis or jaundice, extraocular muscles intact  ENT: ears hearing normal, nasal passages clear, throat clear   Lymph nodes: No lymphadenopathy  Neck is supple without palpable mass, full range of motion  Cardiovascular normal pulse and perfusion in all 4 extremities normal color without edema  Neurologically deep tendon reflexes are equal and normal, no sensation loss or deficits no pathologic reflexes  Psychological: Awake alert and oriented x3 mood and affect normal  Skin no lacerations or ulcerations no nodularity no palpable masses, no erythema or nodularity  Musculoskeletal:   Tenderness over the flexor tendons of the right hip with painful external rotation of the right hip normal active motion in flexion and strength is good on flexion.  Tenderness in the lower portion of the right lower back no radicular symptoms or signs.   10:06 AM

## 2020-04-20 NOTE — Patient Instructions (Signed)
Use the cane x 2 weeks

## 2020-05-08 ENCOUNTER — Encounter: Payer: Self-pay | Admitting: Orthopedic Surgery

## 2020-05-08 ENCOUNTER — Other Ambulatory Visit: Payer: Self-pay

## 2020-05-08 ENCOUNTER — Ambulatory Visit (INDEPENDENT_AMBULATORY_CARE_PROVIDER_SITE_OTHER): Payer: Medicare Other | Admitting: Orthopedic Surgery

## 2020-05-08 VITALS — BP 138/85 | HR 100 | Ht 66.0 in

## 2020-05-08 DIAGNOSIS — Z9889 Other specified postprocedural states: Secondary | ICD-10-CM

## 2020-05-08 DIAGNOSIS — M25551 Pain in right hip: Secondary | ICD-10-CM

## 2020-05-08 DIAGNOSIS — M25511 Pain in right shoulder: Secondary | ICD-10-CM

## 2020-05-08 DIAGNOSIS — G8929 Other chronic pain: Secondary | ICD-10-CM

## 2020-05-08 MED ORDER — HYDROCODONE-ACETAMINOPHEN 5-325 MG PO TABS: 1.0000 | ORAL_TABLET | Freq: Three times a day (TID) | ORAL | 0 refills | Status: DC | PRN

## 2020-05-08 NOTE — Progress Notes (Signed)
Chief Complaint  Patient presents with  . Hip Pain    Right hip,     Encounter Diagnoses  Name Primary?  . Acute right hip pain Yes  . Chronic right shoulder pain   . Status post rotator cuff repair 10/15/2018//right shoulder      Acute right hip pain has resolved  Does complain of back spasms from her lower back symptoms she does have some Robaxin which we advised her to take and use a heating pad  She also continues to complain of the right shoulder which is had an MRI which shows an intact rotator cuff but severe degenerative joint disease and we discussed that she will need a shoulder replacement  She also has chronic pain and we gave her some Norco \ Meds ordered this encounter  Medications  . HYDROcodone-acetaminophen (NORCO/VICODIN) 5-325 MG tablet    Sig: Take 1 tablet by mouth every 8 (eight) hours as needed for up to 7 days for moderate pain.    Dispense:  21 tablet    Refill:  0

## 2020-06-01 ENCOUNTER — Other Ambulatory Visit: Payer: Self-pay | Admitting: Orthopedic Surgery

## 2020-06-01 ENCOUNTER — Telehealth: Payer: Self-pay | Admitting: Orthopedic Surgery

## 2020-06-01 MED ORDER — HYDROCODONE-ACETAMINOPHEN 5-325 MG PO TABS: 1.0000 | ORAL_TABLET | Freq: Three times a day (TID) | ORAL | 0 refills | Status: AC | PRN

## 2020-06-01 NOTE — Telephone Encounter (Signed)
Patient requests Hydrocodone/Acetaminophen 5-325 mg 5-325 mgs.  Qty  21      Sig: Take 1 tablet by mouth every 8 (eight) hours as needed for up to 7 days for moderate pain.   Patient uses Psychologist, forensic

## 2020-06-23 ENCOUNTER — Other Ambulatory Visit: Payer: Self-pay | Admitting: Orthopedic Surgery

## 2020-06-23 NOTE — Telephone Encounter (Signed)
Patient requests Hydrocodone/Acetaminophen 5-325 mgs.  Qty  21  Sig: Take 1 tablet by mouth every 8 (eight) hours as needed for up to 7 days for moderate pain.  Patient states she uses Psychologist, forensic

## 2020-06-26 MED ORDER — HYDROCODONE-ACETAMINOPHEN 5-325 MG PO TABS
1.0000 | ORAL_TABLET | Freq: Three times a day (TID) | ORAL | 0 refills | Status: DC | PRN
Start: 2020-06-26 — End: 2020-07-03

## 2020-07-03 ENCOUNTER — Ambulatory Visit (INDEPENDENT_AMBULATORY_CARE_PROVIDER_SITE_OTHER): Payer: Medicare Other | Admitting: Orthopedic Surgery

## 2020-07-03 ENCOUNTER — Other Ambulatory Visit: Payer: Self-pay

## 2020-07-03 ENCOUNTER — Encounter: Payer: Self-pay | Admitting: Orthopedic Surgery

## 2020-07-03 VITALS — BP 160/108 | HR 88 | Ht 66.0 in | Wt 172.0 lb

## 2020-07-03 DIAGNOSIS — M25511 Pain in right shoulder: Secondary | ICD-10-CM

## 2020-07-03 DIAGNOSIS — G8929 Other chronic pain: Secondary | ICD-10-CM | POA: Diagnosis not present

## 2020-07-03 MED ORDER — HYDROCODONE-ACETAMINOPHEN 5-325 MG PO TABS: 1.0000 | ORAL_TABLET | Freq: Three times a day (TID) | ORAL | 0 refills | Status: DC | PRN

## 2020-07-03 NOTE — Patient Instructions (Signed)
TO SEE DR Dallas Schimke IN June

## 2020-07-03 NOTE — Progress Notes (Signed)
Chief Complaint  Patient presents with  . Shoulder Pain    R/ hurting, causes a lot of problems with daily things that I do    70 year old female still complains of severe shoulder pain interferes with her activities of daily living she is on hydrocodone which she says does relieve the pain  She is considering total shoulder but is afraid that she might not recover well and wants to wait until July before she has any surgery  She still has a good functional range of motion but painful forward elevation she has not lost any external rotation  Her images on MRI showed glenohumeral joint deterioration  I will have her see Dr. Dallas Schimke in June as she wants to do surgery possibly in July   Meds ordered this encounter  Medications  . HYDROcodone-acetaminophen (NORCO/VICODIN) 5-325 MG tablet    Sig: Take 1 tablet by mouth every 8 (eight) hours as needed for moderate pain.    Dispense:  21 tablet    Refill:  0   Chronic with exacerbation prescription management

## 2020-07-31 ENCOUNTER — Other Ambulatory Visit: Payer: Self-pay | Admitting: Radiology

## 2020-07-31 DIAGNOSIS — G8929 Other chronic pain: Secondary | ICD-10-CM

## 2020-07-31 NOTE — Telephone Encounter (Signed)
Patient called and said she is in a lot of pain and asks for hydrocodone refill.

## 2020-08-01 MED ORDER — HYDROCODONE-ACETAMINOPHEN 5-325 MG PO TABS: 1.0000 | ORAL_TABLET | Freq: Three times a day (TID) | ORAL | 0 refills | Status: DC | PRN

## 2020-08-22 ENCOUNTER — Telehealth: Payer: Self-pay | Admitting: Orthopedic Surgery

## 2020-08-22 DIAGNOSIS — G8929 Other chronic pain: Secondary | ICD-10-CM

## 2020-08-22 DIAGNOSIS — M25511 Pain in right shoulder: Secondary | ICD-10-CM

## 2020-08-22 NOTE — Telephone Encounter (Signed)
Can not get through to either number to advise patient, if she calls we should update phone number and advise her Dr Romeo Apple wants her to d/c the Hydrocodone at least for 30 days.

## 2020-08-22 NOTE — Telephone Encounter (Signed)
Patient requests Hydrocodone/Acetaminophen 5-325 mg 5-325 mgs.  Qty  21      Sig: Take 1 tablet by mouth every 8 (eight) hours as needed for up to 7 days for moderate pain.   Patient uses Psychologist, forensic

## 2020-08-24 NOTE — Telephone Encounter (Signed)
Patient called in this morning.  I told her exactly what Dr. Mort Sawyers response was regarding the medication.  She said she understood.

## 2020-09-05 ENCOUNTER — Ambulatory Visit: Payer: Medicare Other

## 2020-09-05 ENCOUNTER — Encounter: Payer: Self-pay | Admitting: Orthopedic Surgery

## 2020-09-05 ENCOUNTER — Ambulatory Visit (INDEPENDENT_AMBULATORY_CARE_PROVIDER_SITE_OTHER): Payer: Medicare Other | Admitting: Orthopedic Surgery

## 2020-09-05 ENCOUNTER — Other Ambulatory Visit: Payer: Self-pay

## 2020-09-05 VITALS — BP 183/106 | HR 68 | Ht 66.0 in | Wt 164.0 lb

## 2020-09-05 DIAGNOSIS — G8929 Other chronic pain: Secondary | ICD-10-CM

## 2020-09-05 DIAGNOSIS — M19011 Primary osteoarthritis, right shoulder: Secondary | ICD-10-CM | POA: Diagnosis not present

## 2020-09-05 DIAGNOSIS — M7582 Other shoulder lesions, left shoulder: Secondary | ICD-10-CM

## 2020-09-05 DIAGNOSIS — M25511 Pain in right shoulder: Secondary | ICD-10-CM

## 2020-09-05 DIAGNOSIS — M25512 Pain in left shoulder: Secondary | ICD-10-CM

## 2020-09-05 NOTE — Progress Notes (Signed)
New Patient Visit  Assessment: Kathryn Woods is a 70 y.o. female with the following: 1.  Glenohumeral arthritis; in the setting of previous rotator cuff repair 2.  Left shoulder rotator cuff tendinitis; in the setting of previous rotator cuff repair   Plan: We had extensive discussion regarding both of her shoulders in clinic today.  At this point in time, her right shoulder is not bothering her.  She has good range of motion, good strength and minimal pain.  We discussed all options, including, but not limited to, continuing with her current regimen, prescription NSAIDs, physical therapy, repeat injections as well as the possibility of proceeding with reverse shoulder arthroplasty.  No change in regimen at this time.  I provided her with literature on reverse shoulder arthroplasty, and all of her questions were answered.  In regards to her left shoulder, it is currently bothering her.  She has reasonable range of motion, and good strength.  We reviewed radiographs in clinic today which do demonstrate some glenohumeral degenerative changes, but these are not severe.  We discussed the possibility of proceeding with a steroid injection, and she is interested.  It is entirely possible that her shoulder pain will gradually improve, and she will regain the function, as she had on the right.  Depending on her progression, we may have to investigate this further with either an MRI, or CT scan.  All questions were answered she is amenable this plan  Procedure note injection Left shoulder    Verbal consent was obtained to inject the left shoulder, subacromial space Timeout was completed to confirm the site of injection.  The skin was prepped with alcohol and ethyl chloride was sprayed at the injection site.  A 21-gauge needle was used to inject 40 mg of Depo-Medrol and 1% lidocaine (3 cc) into the subacromial space of the left shoulder using a posterolateral approach.  There were no  complications. A sterile bandage was applied.    Follow-up: Return if symptoms worsen or fail to improve.  Subjective:  Chief Complaint  Patient presents with  . Shoulder Pain    Bilat shoulder pain, Lt >Rt for past 1-2 mos.    History of Present Illness: Kathryn Woods is a 70 y.o. female who presents for evaluation of right shoulder pain.  Upon presentation she also states she has been having worsening left shoulder pain. She denies a specific injury recently.  However, she has previously had bilateral shoulder, rotator cuff repair completed by Dr. Romeo Apple.  Most recently, her left shoulder surgery was approximately 1 year ago.  She is had multiple injections in her right shoulder, with some relief in her symptoms.  Over the last 1-2 months, she states that the pain in her right shoulder has improved.  She has pretty good function at this time.  However, during this time she has had progressively worsening left shoulder pain.  He has been taking Tylenol and ibuprofen on an as-needed basis, but she states these are not helping.  No recent injections in her left shoulder.  She has been continue with her usual activities, but no formal physical therapy or home exercise program  Review of Systems: No fevers or chills No numbness or tingling No chest pain No shortness of breath No bowel or bladder dysfunction No GI distress No headaches   Medical History:  Past Medical History:  Diagnosis Date  . Arthritis    lumbar region, spondylolisthesis   . Hypercholesteremia     Past Surgical  History:  Procedure Laterality Date  . BACK SURGERY    . KNEE ARTHROSCOPY WITH LATERAL MENISECTOMY Right 02/25/2019   Procedure: RIGHT KNEE ARTHROSCOPY WITH LATERAL MENISECTOMY;  Surgeon: Vickki Hearing, MD;  Location: [facility];  Service: Orthopedics;  Laterality: Right;  . SHOULDER ARTHROSCOPY WITH OPEN ROTATOR CUFF REPAIR Right 10/15/2018   Procedure: SHOULDER ARTHROSCOPY WITH OPEN  ROTATOR CUFF REPAIR;  Surgeon: Vickki Hearing, MD;  Location: [facility];  Service: Orthopedics;  Laterality: Right;  . SHOULDER ARTHROSCOPY WITH ROTATOR CUFF REPAIR Left 08/19/2019   Procedure: LEFT SHOULDER ARTHROSCOPY WITH ROTATOR CUFF REPAIR;  Surgeon: Vickki Hearing, MD;  Location: [facility];  Service: Orthopedics;  Laterality: Left;  With scalene block   . TUBAL LIGATION      Family History  Problem Relation Age of Onset  . Heart attack Mother   . Cancer Father        lung   Social History   Tobacco Use  . Smoking status: Former Smoker    Quit date: 12/06/1982    Years since quitting: 37.7  . Smokeless tobacco: Never Used  Vaping Use  . Vaping Use: Never used  Substance Use Topics  . Alcohol use: No  . Drug use: Yes    Types: Marijuana    Comment: a couple times- for pain relief, pt. informed to not use anymore before surgery     Allergies  Allergen Reactions  . Aspirin Other (See Comments)    "Messes stomach up real bad"    Current Meds  Medication Sig  . Cyanocobalamin ([employer] B12 GUMMIES PO) Take 1 Dose by mouth daily.  Marland Kitchen ibuprofen (ADVIL) 800 MG tablet Take 1 tablet (800 mg total) by mouth every 8 (eight) hours as needed.    Objective: BP (!) 183/106   Pulse 68   Ht 5\' 6"  (1.676 m)   Wt 164 lb (74.4 kg)   BMI 26.47 kg/m   Physical Exam:  General: Alert and oriented.  No acute distress. Gait: Normal  Evaluation the right shoulder demonstrates well-healed surgical incisions.  No surrounding erythema or drainage.  She is near full forward flexion and external rotation at her side.  Abduction 90 degrees with minimal discomfort.  Internal rotation to T12.  Negative belly press.  Some discomfort in the empty can testing position.  Strength is good overall.  Evaluation of left shoulder demonstrates well-healed surgical incisions.  No deformity is appreciated.  No swelling.  Active forward flexion 120 degrees before discomfort.  Passive abduction at her side to  90 degrees.  Internal rotation to her lumbar spine.  Negative belly press.  Pain in the empty can testing position.  Strength is 4+/5 in the supraspinatus and infraspinatus.    IMAGING: I personally ordered and reviewed the following images   X-rays of the right shoulder were obtained in clinic today and demonstrates loss of the glenohumeral joint space.  Osteophytes are present off the inferior aspect of the glenoid.  Very small osteophytes off the inferior aspect of the humerus.  No acute injuries otherwise.  No significant proximal humeral migration.  Impression: Right shoulder glenohumeral arthritis  X-ray of the left shoulder obtained in clinic today and demonstrates mild loss of the glenohumeral joint space.  Very small osteophytes are present at the inferior aspect of the glenoid, as well as the humeral head.  No proximal humeral migration.  Impression: Mild left shoulder glenohumeral arthritis, with sequelae of rotator cuff repair.   New Medications:  No orders of the defined types were placed in this encounter.     Oliver Barre, MD  09/05/2020 1:15 PM

## 2020-09-05 NOTE — Patient Instructions (Signed)

## 2020-09-06 ENCOUNTER — Ambulatory Visit: Payer: Medicare Other | Admitting: Orthopedic Surgery

## 2020-09-12 ENCOUNTER — Ambulatory Visit: Payer: Medicare Other | Admitting: Orthopedic Surgery

## 2020-09-26 ENCOUNTER — Other Ambulatory Visit: Payer: Self-pay | Admitting: Orthopedic Surgery

## 2020-09-26 DIAGNOSIS — G8929 Other chronic pain: Secondary | ICD-10-CM

## 2020-09-26 NOTE — Telephone Encounter (Signed)
Patient requests prescription for Hydrocodone/Acetaminophen 5-325 mgs.  Qty  21      Sig: Take 1 tablet by mouth every 8 (eight) hours as needed for moderate pain   Patient states she uses [employer]

## 2020-09-26 NOTE — Telephone Encounter (Signed)
Previous prescription given by Dr. Romeo Apple. Please advise.

## 2020-09-28 ENCOUNTER — Telehealth: Payer: Self-pay | Admitting: Orthopedic Surgery

## 2020-10-03 ENCOUNTER — Other Ambulatory Visit: Payer: Self-pay

## 2020-10-03 ENCOUNTER — Ambulatory Visit: Payer: Medicare Other | Admitting: Orthopedic Surgery

## 2020-10-05 NOTE — Telephone Encounter (Signed)
Done in error.

## 2020-10-26 ENCOUNTER — Encounter: Payer: Self-pay | Admitting: Emergency Medicine

## 2020-10-26 ENCOUNTER — Ambulatory Visit
Admission: EM | Admit: 2020-10-26 | Discharge: 2020-10-26 | Discharge status: Against Medical Advice | Payer: Medicare Other

## 2020-10-26 ENCOUNTER — Other Ambulatory Visit: Payer: Self-pay

## 2020-11-21 ENCOUNTER — Other Ambulatory Visit: Payer: Self-pay

## 2020-11-21 ENCOUNTER — Encounter: Payer: Self-pay | Admitting: Orthopedic Surgery

## 2020-11-21 ENCOUNTER — Ambulatory Visit: Payer: Medicare Other | Admitting: Orthopedic Surgery

## 2020-11-21 VITALS — BP 175/107 | HR 69 | Ht 66.0 in | Wt 170.0 lb

## 2020-11-21 DIAGNOSIS — M25512 Pain in left shoulder: Secondary | ICD-10-CM | POA: Diagnosis not present

## 2020-11-21 DIAGNOSIS — G8929 Other chronic pain: Secondary | ICD-10-CM | POA: Diagnosis not present

## 2020-11-21 DIAGNOSIS — Z9889 Other specified postprocedural states: Secondary | ICD-10-CM

## 2020-11-21 MED ORDER — TRAMADOL HCL 50 MG PO TABS
50.0000 mg | ORAL_TABLET | Freq: Four times a day (QID) | ORAL | 0 refills | Status: AC | PRN
Start: 2020-11-21 — End: 2020-11-28

## 2020-11-21 NOTE — Progress Notes (Signed)
Orthopaedic Clinic Return  Assessment: Kathryn Woods is a 70 y.o. female with the following: Left shoulder pain, in setting of previous rotator cuff repair  Plan: Patient previously had left rotator cuff repair with Dr. Romeo Apple in May, 2021.  She reports that she has had pain ever since surgery.  She has not regained her function.  At the last visit, we injected her left shoulder, which improved her symptoms for approximately 1 week.  At this point, I am concerned that she has not healed the rotator cuff repair.  As a result, I have recommended obtaining an MRI to evaluate the continuity of the rotator cuff tendons.  The rotator cuff tendons have in fact healed, we can refer her for an image guided glenohumeral joint injection.  However, if she has not healed, we may have to consider proceeding with a reverse shoulder arthroplasty.  All questions were answered, she is amenable to this plan.  She will follow-up after the MRI.  Meds ordered this encounter  Medications   traMADol (ULTRAM) 50 MG tablet    Sig: Take 1 tablet (50 mg total) by mouth every 6 (six) hours as needed for up to 7 days.    Dispense:  28 tablet    Refill:  0    Body mass index is 27.44 kg/m.  Follow-up: Return for After MRI.   Subjective:  Chief Complaint  Patient presents with   Shoulder Pain    Pt with continued left shoulder pain DOS 08/19/19 by Dr. Romeo Apple. States injection in shoulder only lasted a week.     History of Present Illness: Kathryn Woods is a 70 y.o. female who returns to clinic for repeat evaluation of her left shoulder.  She was last seen in clinic approximately 2 months ago.  At that time, her left shoulder was injected.  However, she states that it only improved her symptoms for approximately 1 week.  Since then, she has had persistent pain and limited ability to use her left arm.  She has previously taken hydrocodone for pain, but has not had anything for pain for  several weeks.  Pain is worse at night.  Pain is located over the lateral and posterior aspect of the left shoulder  Review of Systems: No fevers or chills No numbness or tingling No chest pain No shortness of breath No bowel or bladder dysfunction No GI distress No headaches   Objective: BP (!) 175/107   Pulse 69   Ht 5\' 6"  (1.676 m)   Wt 170 lb (77.1 kg)   BMI 27.44 kg/m   Physical Exam:  Alert and oriented.  No acute distress.  Evaluation left shoulder demonstrates a well-healed lateral shoulder surgical incision, without surrounding erythema or drainage.  Abduction limited to 75 degrees.  Forward flexion limited to 90 degrees.  Passive flexion beyond 90 degrees, causes significant pain.  Sensation is intact over the axillary patch.  Sensation intact distally in the left hand.  2+ radial pulse.  IMAGING: I personally ordered and reviewed the following images:  No new imaging obtained today.  , MD 11/21/2020 4:46 PM

## 2020-11-28 DIAGNOSIS — I10 Essential (primary) hypertension: Secondary | ICD-10-CM | POA: Insufficient documentation

## 2020-12-07 ENCOUNTER — Ambulatory Visit (HOSPITAL_COMMUNITY)
Admission: RE | Admit: 2020-12-07 | Discharge: 2020-12-07 | Disposition: A | Discharge status: Home / Self Care | Payer: Medicare Other | Source: Ambulatory Visit | Attending: Orthopedic Surgery | Admitting: Orthopedic Surgery

## 2020-12-07 ENCOUNTER — Other Ambulatory Visit: Payer: Self-pay

## 2020-12-07 DIAGNOSIS — M25512 Pain in left shoulder: Secondary | ICD-10-CM | POA: Diagnosis present

## 2020-12-07 DIAGNOSIS — G8929 Other chronic pain: Secondary | ICD-10-CM | POA: Insufficient documentation

## 2020-12-07 DIAGNOSIS — Z9889 Other specified postprocedural states: Secondary | ICD-10-CM | POA: Diagnosis not present

## 2020-12-13 ENCOUNTER — Other Ambulatory Visit: Payer: Self-pay

## 2020-12-13 ENCOUNTER — Ambulatory Visit (INDEPENDENT_AMBULATORY_CARE_PROVIDER_SITE_OTHER): Payer: Medicare Other | Admitting: Orthopedic Surgery

## 2020-12-13 DIAGNOSIS — M75122 Complete rotator cuff tear or rupture of left shoulder, not specified as traumatic: Secondary | ICD-10-CM

## 2020-12-14 ENCOUNTER — Encounter: Payer: Self-pay | Admitting: Orthopedic Surgery

## 2020-12-14 NOTE — Progress Notes (Signed)
Orthopaedic Clinic Return  Assessment: Kathryn Woods is a 70 y.o. female with the following: Left shoulder pain, with failed rotator cuff repair  Plan: Reviewed MRI with patient in clinic today which demonstrates a full-thickness tear of the supraspinatus.  She is now approximately 18 months out from rotator cuff repair.  The full-thickness tear either represents a nonhealed portion of the rotator cuff repair, or retear.  Nonetheless, she has restricted range of motion and pain.  As result, she is interested in surgery being helpful.  I recommended a reverse shoulder arthroplasty.  This was discussed in detail.  I provided her with pamphlets previously.  She is interested in proceeding.  She will obtain medical clearance for surgery.  Once this is completed, we will schedule a CT scan and finalize a surgical date.  I will see her 1 more time prior to surgery.  All questions were answered she is amenable to this plan.  Follow-up: Return for After medical clearance for OR.   Subjective:  Chief Complaint  Patient presents with   Results    MRI left shoulder    History of Present Illness: Kathryn Woods is a 70 y.o. female who returns to clinic for repeat evaluation of her left shoulder.  Briefly, she had a left shoulder rotator cuff repair approximately 18 months ago.  Her pain is not improved.  She continues to have pain and dysfunction of the left shoulder.  We attempted multiple treatment options including injections.  She has done physical therapy.  We obtained an MRI which demonstrates either a retear or corneal portion of the overall repair.  She presents today to discuss her options.  Review of Systems: No fevers or chills No numbness or tingling No chest pain No shortness of breath No bowel or bladder dysfunction No GI distress No headaches   Objective: There were no vitals taken for this visit.  Physical Exam:  Alert and oriented.  No acute  distress  Well-healed surgical incisions.  No surrounding erythema or drainage.  Limited forward flexion.  Unable to get her hand to her lower back.  Pain in the empty can testing position.  Negative belly press.  Fingers warm and well-perfused.  Sensation is intact in axillary nerve distribution.  IMAGING: I personally ordered and reviewed the following images:  Left shoulder MRI  IMPRESSION: 1. Mild tendinosis of the supraspinatus tendon with a 15 mm full-thickness tear anteriorly and 16 mm of retraction. 2. Severe tendinosis of the infraspinatus tendon. 3. Mild tendinosis of the subscapularis tendon. 4. Mild tendinosis of the intra-articular portion of the long head of the biceps tendon.   Oliver Barre, MD 12/14/2020 11:41 AM

## 2021-01-02 ENCOUNTER — Telehealth: Payer: Self-pay

## 2021-01-02 NOTE — Telephone Encounter (Signed)
Called to check the status of pt surgical clearance, told by [facility] clinic staff that pt has been seen but will need to f/u with them for final clearance. Pt is scheduled to return to PCP office 9/28.

## 2021-01-04 ENCOUNTER — Encounter: Payer: Self-pay | Admitting: Radiology

## 2021-01-04 NOTE — Progress Notes (Signed)
Surgical clearance letter received from [facility].  Copy placed in Dr Dallas Schimke' inbox in clinic.

## 2021-01-10 ENCOUNTER — Telehealth: Payer: Self-pay

## 2021-01-10 DIAGNOSIS — M75122 Complete rotator cuff tear or rupture of left shoulder, not specified as traumatic: Secondary | ICD-10-CM

## 2021-01-10 NOTE — Telephone Encounter (Signed)
Called pt to let her know we received clearance for surgery, CT ordered and f/u appointment scheduled to discuss surgery.

## 2021-01-26 ENCOUNTER — Ambulatory Visit (HOSPITAL_COMMUNITY): Admission: RE | Admit: 2021-01-26 | Payer: Medicare Other | Source: Ambulatory Visit

## 2021-01-30 ENCOUNTER — Ambulatory Visit: Payer: Medicare Other | Admitting: Orthopedic Surgery

## 2021-01-31 ENCOUNTER — Other Ambulatory Visit: Payer: Self-pay

## 2021-01-31 ENCOUNTER — Ambulatory Visit (HOSPITAL_COMMUNITY)
Admission: RE | Admit: 2021-01-31 | Discharge: 2021-01-31 | Disposition: A | Discharge status: Home / Self Care | Payer: Medicare Other | Source: Ambulatory Visit | Attending: Orthopedic Surgery | Admitting: Orthopedic Surgery

## 2021-01-31 DIAGNOSIS — M75122 Complete rotator cuff tear or rupture of left shoulder, not specified as traumatic: Secondary | ICD-10-CM | POA: Diagnosis present

## 2021-02-06 ENCOUNTER — Ambulatory Visit: Payer: Medicare Other | Admitting: Orthopedic Surgery

## 2021-02-06 ENCOUNTER — Encounter: Payer: Self-pay | Admitting: Orthopedic Surgery

## 2021-02-06 ENCOUNTER — Other Ambulatory Visit: Payer: Self-pay

## 2021-02-06 VITALS — Ht 65.5 in | Wt 170.0 lb

## 2021-02-06 DIAGNOSIS — M75122 Complete rotator cuff tear or rupture of left shoulder, not specified as traumatic: Secondary | ICD-10-CM | POA: Diagnosis not present

## 2021-02-06 NOTE — H&P (View-Only) (Signed)
Orthopaedic Clinic Return  Assessment: Kathryn Woods is a 70 y.o. 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  MRI demonstrates poor healing of the rotator cuff repair.  On CT scan, there is no obvious deformity to the humeral head or the glenoid.  She has tried injections, physical therapy and over-the-counter pain medications.  She continues to have difficulty.  She is interested in pursuing left reverse shoulder arthroplasty.  Risks and benefits of surgery, including, but not limited to infection, bleeding, persistent pain, damage to surrounding structures, need for further surgery, malunion, nonunion and more severe complications associated with anesthesia were discussed.  All questions have been answered and they have elected to proceed with surgery.    She would like to proceed with surgery on February 22, 2021  Surgical Plan  Procedure: Left reverse shoulder arthroplasty Disposition: Overnight observation Anesthesia: General and a block Medical Comorbidities: Hyperlipidemia hypertension, left shoulder rotator cuff repair  Follow-up: Return for After surgery; 2 weeks postop, DOS 11/17.   Subjective:  Chief Complaint  Patient presents with   Results    Review CT scan right shoulder     History of Present Illness: Kathryn Woods is a 70 y.o. female who returns to clinic for repeat evaluation of her left shoulder.  She has had a left shoulder rotator cuff repair, which did not fully heal.  She continues to have pain, primarily in the anterior aspect of the left shoulder.  Limited function as a result.  She has worked diligently with physical therapy, without improvement.  Injections have not improved her symptoms.  She is interested in pursuing surgery.  Review of Systems: No fevers or chills No  numbness or tingling No chest pain No shortness of breath No bowel or bladder dysfunction No GI distress No headaches   Objective: Ht 5' 5.5" (1.664 m)   Wt 170 lb (77.1 kg)   BMI 27.86 kg/m   Physical Exam:  Alert and oriented.  No acute distress.  Well-healed surgical incisions.  No surrounding erythema or drainage.  Limited forward flexion.  Unable to get her hand to her lower back.  Pain in the empty can testing position.  Negative belly press.  Fingers warm and well-perfused.  Sensation is intact in axillary nerve distribution.  Limited ROM overall.    IMAGING: I personally ordered and reviewed the following images:  Left shoulder CT scan IMPRESSION: 1. Postsurgical changes consistent with previous rotator cuff repair and/or bicipital tenodesis. 2. Mild glenohumeral and acromioclavicular degenerative changes. Type 3 acromion. 3. No acute osseous findings. 4. No focal muscular atrophy identified.  Oliver Barre, MD 02/06/2021 11:02 PM

## 2021-02-06 NOTE — Progress Notes (Signed)
Orthopaedic Clinic Return  Assessment: Kathryn Woods is a 70 y.o. 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  MRI demonstrates poor healing of the rotator cuff repair.  On CT scan, there is no obvious deformity to the humeral head or the glenoid.  She has tried injections, physical therapy and over-the-counter pain medications.  She continues to have difficulty.  She is interested in pursuing left reverse shoulder arthroplasty.  Risks and benefits of surgery, including, but not limited to infection, bleeding, persistent pain, damage to surrounding structures, need for further surgery, malunion, nonunion and more severe complications associated with anesthesia were discussed.  All questions have been answered and they have elected to proceed with surgery.    She would like to proceed with surgery on February 22, 2021  Surgical Plan  Procedure: Left reverse shoulder arthroplasty Disposition: Overnight observation Anesthesia: General and a block Medical Comorbidities: Hyperlipidemia hypertension, left shoulder rotator cuff repair  Follow-up: Return for After surgery; 2 weeks postop, DOS 11/17.   Subjective:  Chief Complaint  Patient presents with   Results    Review CT scan right shoulder     History of Present Illness: Kathryn Woods is a 70 y.o. female who returns to clinic for repeat evaluation of her left shoulder.  She has had a left shoulder rotator cuff repair, which did not fully heal.  She continues to have pain, primarily in the anterior aspect of the left shoulder.  Limited function as a result.  She has worked diligently with physical therapy, without improvement.  Injections have not improved her symptoms.  She is interested in pursuing surgery.  Review of Systems: No fevers or chills No  numbness or tingling No chest pain No shortness of breath No bowel or bladder dysfunction No GI distress No headaches   Objective: Ht 5' 5.5" (1.664 m)   Wt 170 lb (77.1 kg)   BMI 27.86 kg/m   Physical Exam:  Alert and oriented.  No acute distress.  Well-healed surgical incisions.  No surrounding erythema or drainage.  Limited forward flexion.  Unable to get her hand to her lower back.  Pain in the empty can testing position.  Negative belly press.  Fingers warm and well-perfused.  Sensation is intact in axillary nerve distribution.  Limited ROM overall.    IMAGING: I personally ordered and reviewed the following images:  Left shoulder CT scan IMPRESSION: 1. Postsurgical changes consistent with previous rotator cuff repair and/or bicipital tenodesis. 2. Mild glenohumeral and acromioclavicular degenerative changes. Type 3 acromion. 3. No acute osseous findings. 4. No focal muscular atrophy identified.  Oliver Barre, MD 02/06/2021 11:02 PM

## 2021-02-20 ENCOUNTER — Encounter (HOSPITAL_COMMUNITY)
Admission: RE | Admit: 2021-02-20 | Discharge: 2021-02-20 | Disposition: A | Discharge status: Home / Self Care | Payer: Medicare Other | Source: Ambulatory Visit | Attending: Orthopedic Surgery | Admitting: Orthopedic Surgery

## 2021-02-20 ENCOUNTER — Other Ambulatory Visit (HOSPITAL_COMMUNITY)
Admission: RE | Admit: 2021-02-20 | Discharge: 2021-02-20 | Disposition: A | Discharge status: Home / Self Care | Payer: Medicare Other | Source: Ambulatory Visit | Attending: Orthopedic Surgery | Admitting: Orthopedic Surgery

## 2021-02-20 ENCOUNTER — Other Ambulatory Visit: Payer: Self-pay

## 2021-02-20 DIAGNOSIS — M75122 Complete rotator cuff tear or rupture of left shoulder, not specified as traumatic: Secondary | ICD-10-CM | POA: Insufficient documentation

## 2021-02-20 DIAGNOSIS — Z01818 Encounter for other preprocedural examination: Secondary | ICD-10-CM

## 2021-02-20 DIAGNOSIS — Z01812 Encounter for preprocedural laboratory examination: Secondary | ICD-10-CM | POA: Insufficient documentation

## 2021-02-20 DIAGNOSIS — Z20822 Contact with and (suspected) exposure to covid-19: Secondary | ICD-10-CM | POA: Insufficient documentation

## 2021-02-20 LAB — BASIC METABOLIC PANEL
Anion gap: 7 (ref 5–15)
BUN: 12 mg/dL (ref 8–23)
CO2: 26 mmol/L (ref 22–32)
Calcium: 9.3 mg/dL (ref 8.9–10.3)
Chloride: 106 mmol/L (ref 98–111)
Creatinine, Ser: 0.97 mg/dL (ref 0.44–1.00)
GFR, Estimated: >60 mL/min (ref >60)
Glucose, Bld: 91 mg/dL (ref 70–99)
Potassium: 4.6 mmol/L (ref 3.5–5.1)
Sodium: 139 mmol/L (ref 135–145)

## 2021-02-20 LAB — CBC WITH DIFFERENTIAL/PLATELET
Abs Immature Granulocytes: 0.01 10*3/uL (ref 0.00–0.07)
Basophils Absolute: 0 10*3/uL (ref 0.0–0.1)
Basophils Relative: 1 %
Eosinophils Absolute: 0.6 10*3/uL — ABNORMAL HIGH (ref 0.0–0.5)
Eosinophils Relative: 9 %
HCT: 39.5 % (ref 36.0–46.0)
Hemoglobin: 12.4 g/dL (ref 12.0–15.0)
Immature Granulocytes: 0 %
Lymphocytes Relative: 32 %
Lymphs Abs: 2.3 10*3/uL (ref 0.7–4.0)
MCH: 29 pg (ref 26.0–34.0)
MCHC: 31.4 g/dL (ref 30.0–36.0)
MCV: 92.3 fL (ref 80.0–100.0)
Monocytes Absolute: 0.6 10*3/uL (ref 0.1–1.0)
Monocytes Relative: 8 %
Neutro Abs: 3.6 10*3/uL (ref 1.7–7.7)
Neutrophils Relative %: 50 %
Platelets: 297 10*3/uL (ref 150–400)
RBC: 4.28 MIL/uL (ref 3.87–5.11)
RDW: 14.4 % (ref 11.5–15.5)
WBC: 7.1 10*3/uL (ref 4.0–10.5)
nRBC: 0 % (ref 0.0–0.2)

## 2021-02-20 NOTE — Patient Instructions (Signed)
Kathryn Woods  02/20/2021     @PREFPERIOPPHARMACY @   Your procedure is scheduled on 02/22/2021.  Report to 02/24/2021 at 6:15 A.M.  Call this number if you have problems the morning of surgery:  [phone]   Remember:  Do not eat or drink after midnight.     Take these medicines the morning of surgery with A SIP OF WATER : none    Do not wear jewelry, make-up or nail polish.  Do not wear lotions, powders, or perfumes, or deodorant.  Do not shave 48 hours prior to surgery.  Men may shave face and neck.  Do not bring valuables to the hospital.  [facility] is not responsible for any belongings or valuables.  Contacts, dentures or bridgework may not be worn into surgery.  Leave your suitcase in the car.  After surgery it may be brought to your room.  For patients admitted to the hospital, discharge time will be determined by your treatment team.  Patients discharged the day of surgery will not be allowed to drive home.   Name and phone number of your driver:   family Special instructions:  n/a  Please read over the following fact sheets that you were given. Care and Recovery After SurgeryReverse Total Shoulder Replacement Reverse total shoulder replacement is a surgery to replace the shoulder joint. You may need this surgery if your rotator cuff is torn and cannot be repaired. The rotator cuff is a group of muscles and strong tissues that connect muscle to bone (tendons) in the shoulder joint. The rotator cuff helps you lift your arm. You may also need a reverse total shoulder replacement if you have: A previously unsuccessful normal shoulder replacement. Severe pain that keeps you from lifting your arm. A severe fracture of your shoulder joint. Repeated dislocations of your shoulder joint. A tumor in your shoulder joint. The shoulder is a ball-and-socket joint. The top of the upper arm bone (humerus) is shaped like a ball, and it fits into the socket of the  shoulder blade (scapula). During a normal shoulder replacement, a plastic cup replaces the socket, and a metal ball replaces the ball of the humerus. This allows the rotator cuff to lift the arm, like it normally does. During a reverse total shoulder replacement, the plastic socket is placed into the top of the humerus, and the metal ball is placed into the shoulder socket. This means that the positions of the ball and socket are reversed. This lets you use other shoulder muscles to lift your arm, instead of using the rotator cuff. Tell a health care provider about: Any allergies you have. All medicines you are taking, including vitamins, herbs, eye drops, creams, and over-the-counter medicines. Any problems you or family members have had with anesthetic medicines. Any blood disorders you have. Any surgeries you have had. Any medical conditions you have. Whether you are pregnant or may be pregnant. What are the risks? Generally, this is a safe procedure. However, problems may occur, including: Infection. Bleeding. Blood clots. Allergic reactions to medicines. Damage to nearby structures or organs, such as blood vessels or nerves. Problems with the shoulder, such as: Loosening of the new shoulder parts over time, which may require replacement. Poor return of shoulder movement. Shoulder pain. The ball and socket separating (dislocation). What happens before the procedure? Staying hydrated Follow instructions from your health care provider about hydration, which may include: Up to 2 hours before the procedure - you may continue to drink  clear liquids, such as water, clear fruit juice, black coffee, and plain tea.  Eating and drinking restrictions Follow instructions from your health care provider about eating and drinking, which may include: 8 hours before the procedure - stop eating heavy meals or foods, such as meat, fried foods, or fatty foods. 6 hours before the procedure - stop eating  light meals or foods, such as toast or cereal. 6 hours before the procedure - stop drinking milk or drinks that contain milk. 2 hours before the procedure - stop drinking clear liquids. Medicines Ask your health care provider about: Changing or stopping your regular medicines. This is especially important if you are taking diabetes medicines or blood thinners. Taking medicines such as aspirin and ibuprofen. These medicines can thin your blood. Do not take these medicines unless your health care provider tells you to take them. Taking over-the-counter medicines, vitamins, herbs, and supplements. Tests You may have tests, such as: Blood tests. Chest X-rays. Heart tests. General instructions Do not use any products that contain nicotine or tobacco for at least 4 weeks before the procedure. These products include cigarettes, e-cigarettes, and chewing tobacco. If you need help quitting, ask your health care provider. Keep your body and teeth clean. Germs from anywhere in your body can travel to your new joint and infect it. Tell your health care provider if you: Plan to have dental care and routine cleanings. Develop any skin infections. Ask your health care provider: How your surgery site will be marked. What steps will be taken to help prevent infection. These steps may include: Removing hair at the surgery site. Washing skin with a germ-killing soap. Receiving antibiotic medicine. Plan to have a responsible adult take you home from the hospital or clinic. Plan to have someone help around the house for a few weeks after your procedure. What happens during the procedure? An IV will be inserted into one of your veins. You will be given one or more of the following: A medicine to help you relax (sedative). A medicine to numb the area (local anesthetic). A medicine to make you fall asleep (general anesthetic). A medicine that is injected into an area of your body to numb everything below the  injection site (regional anesthetic). An incision will be made in the front or the top of your shoulder. Your shoulder joint will be opened and cleaned, and the ball of the humerus will be removed from the socket. A metal ball will be screwed into your scapula. The plastic socket will be inserted into the top of your humerus and held in place. The plastic socket will be positioned onto the metal ball and fixed into place. Your incision will be closed with stitches (sutures) or staples. Your incision will be covered with a bandage (dressing) or other wound covering. Your arm will be put in a sling. This will keep your arm still while it heals. The procedure may vary among health care providers and hospitals. What happens after the procedure? Your blood pressure, heart rate, breathing rate, and blood oxygen level will be monitored until you leave the hospital or clinic. You may continue to receive fluids and medicines, such as pain medicines or antibiotics, through an IV. You will be shown exercises to do at home to improve movement and strength in your shoulder (physical therapy). If you were given a sedative during the procedure, it can affect you for several hours. Do not drive or operate machinery until your health care provider says that it  is safe. Summary A reverse total shoulder replacement may be done if you have shoulder pain due to a rotator cuff tear that cannot be repaired, a severe fracture or repeated dislocations, or a previous unsuccessful shoulder replacement. Follow instructions from your health care provider about eating and drinking before the surgery. Plan to have someone take you home and help around the house for a few weeks after your procedure. You will be shown exercises to do at home to improve movement and strength in your shoulder (physical therapy). This information is not intended to replace advice given to you by your health care provider. Make sure you discuss any  questions you have with your health care provider. Document Revised: 09/08/2019 Document Reviewed: 09/08/2019 Elsevier Patient Education  2022 [employer].

## 2021-02-21 ENCOUNTER — Other Ambulatory Visit: Payer: Self-pay

## 2021-02-21 DIAGNOSIS — M75122 Complete rotator cuff tear or rupture of left shoulder, not specified as traumatic: Secondary | ICD-10-CM

## 2021-02-21 LAB — SARS CORONAVIRUS 2 (TAT 6-24 HRS): SARS Coronavirus 2: NEGATIVE

## 2021-02-22 ENCOUNTER — Encounter (HOSPITAL_COMMUNITY): Payer: Self-pay | Admitting: Orthopedic Surgery

## 2021-02-22 ENCOUNTER — Other Ambulatory Visit: Payer: Self-pay

## 2021-02-22 ENCOUNTER — Observation Stay (HOSPITAL_COMMUNITY)
Admission: RE | Admit: 2021-02-22 | Discharge: 2021-02-23 | Disposition: A | Discharge status: Home / Self Care | Payer: Medicare Other | Attending: Orthopedic Surgery | Admitting: Orthopedic Surgery

## 2021-02-22 ENCOUNTER — Ambulatory Visit (HOSPITAL_COMMUNITY): Payer: Medicare Other | Admitting: Anesthesiology

## 2021-02-22 ENCOUNTER — Encounter (HOSPITAL_COMMUNITY)
Admission: RE | Disposition: A | Discharge status: Home / Self Care | Payer: Self-pay | Source: Home / Self Care | Attending: Orthopedic Surgery

## 2021-02-22 ENCOUNTER — Ambulatory Visit (HOSPITAL_COMMUNITY): Payer: Medicare Other

## 2021-02-22 DIAGNOSIS — M75102 Unspecified rotator cuff tear or rupture of left shoulder, not specified as traumatic: Principal | ICD-10-CM | POA: Insufficient documentation

## 2021-02-22 DIAGNOSIS — Z9889 Other specified postprocedural states: Secondary | ICD-10-CM

## 2021-02-22 DIAGNOSIS — M75122 Complete rotator cuff tear or rupture of left shoulder, not specified as traumatic: Secondary | ICD-10-CM

## 2021-02-22 DIAGNOSIS — M24111 Other articular cartilage disorders, right shoulder: Secondary | ICD-10-CM

## 2021-02-22 HISTORY — PX: REVERSE SHOULDER ARTHROPLASTY: SHX5054

## 2021-02-22 SURGERY — ARTHROPLASTY, SHOULDER, TOTAL, REVERSE
Anesthesia: General | Site: Shoulder | Laterality: Left

## 2021-02-22 MED ORDER — OXYCODONE HCL 5 MG PO TABS
5.0000 mg | ORAL_TABLET | ORAL | Status: DC | PRN
  Administered 2021-02-22: 23:00:00 5 mg via ORAL
  Filled 2021-02-22: qty 1

## 2021-02-22 MED ORDER — ONDANSETRON HCL 4 MG/2ML IJ SOLN
INTRAMUSCULAR | Status: DC | PRN
  Administered 2021-02-22: 4 mg via INTRAVENOUS

## 2021-02-22 MED ORDER — PROPOFOL 10 MG/ML IV BOLUS
INTRAVENOUS | Status: DC | PRN
  Administered 2021-02-22: 140 mg via INTRAVENOUS

## 2021-02-22 MED ORDER — FENTANYL CITRATE (PF) 100 MCG/2ML IJ SOLN
INTRAMUSCULAR | Status: AC
  Filled 2021-02-22: qty 2

## 2021-02-22 MED ORDER — CHLORHEXIDINE GLUCONATE CLOTH 2 % EX PADS
6.0000 | MEDICATED_PAD | Freq: Every day | CUTANEOUS | Status: DC
  Administered 2021-02-23: 6 via TOPICAL

## 2021-02-22 MED ORDER — CHLORHEXIDINE GLUCONATE 0.12 % MT SOLN
15.0000 mL | Freq: Once | OROMUCOSAL | Status: AC
  Administered 2021-02-22: 07:00:00 15 mL via OROMUCOSAL

## 2021-02-22 MED ORDER — PROPOFOL 10 MG/ML IV BOLUS
INTRAVENOUS | Status: AC
  Filled 2021-02-22: qty 20

## 2021-02-22 MED ORDER — CEFAZOLIN SODIUM-DEXTROSE 2-4 GM/100ML-% IV SOLN
2.0000 g | INTRAVENOUS | Status: AC
  Administered 2021-02-22: 07:00:00 2 g via INTRAVENOUS
  Filled 2021-02-22: qty 100

## 2021-02-22 MED ORDER — ROCURONIUM BROMIDE 100 MG/10ML IV SOLN
INTRAVENOUS | Status: DC | PRN
  Administered 2021-02-22 (×2): 50 mg via INTRAVENOUS

## 2021-02-22 MED ORDER — TRANEXAMIC ACID-NACL 1000-0.7 MG/100ML-% IV SOLN
1000.0000 mg | INTRAVENOUS | Status: AC
  Administered 2021-02-22: 08:00:00 1000 mg via INTRAVENOUS
  Filled 2021-02-22: qty 100

## 2021-02-22 MED ORDER — VANCOMYCIN HCL 1000 MG IV SOLR
INTRAVENOUS | Status: AC
  Filled 2021-02-22: qty 20

## 2021-02-22 MED ORDER — VANCOMYCIN HCL 1000 MG IV SOLR
INTRAVENOUS | Status: DC | PRN
  Administered 2021-02-22: 1000 mg

## 2021-02-22 MED ORDER — ONDANSETRON HCL 4 MG/2ML IJ SOLN: 4.0000 mg | Freq: Four times a day (QID) | INTRAMUSCULAR | Status: DC | PRN

## 2021-02-22 MED ORDER — MORPHINE SULFATE (PF) 2 MG/ML IV SOLN
1.0000 mg | INTRAVENOUS | Status: DC | PRN
  Administered 2021-02-23: 1 mg via INTRAVENOUS
  Filled 2021-02-22: qty 1

## 2021-02-22 MED ORDER — ROPIVACAINE HCL 5 MG/ML IJ SOLN
INTRAMUSCULAR | Status: DC | PRN
  Administered 2021-02-22: 30 mL via PERINEURAL

## 2021-02-22 MED ORDER — STERILE WATER FOR IRRIGATION IR SOLN
Status: DC | PRN
  Administered 2021-02-22: 1000 mL

## 2021-02-22 MED ORDER — ACETAMINOPHEN 500 MG PO TABS
1000.0000 mg | ORAL_TABLET | Freq: Three times a day (TID) | ORAL | Status: DC
Start: 2021-02-22 — End: 2021-02-23
  Administered 2021-02-22 – 2021-02-23 (×3): 1000 mg via ORAL
  Filled 2021-02-22 (×3): qty 2

## 2021-02-22 MED ORDER — MIDAZOLAM HCL 5 MG/5ML IJ SOLN
INTRAMUSCULAR | Status: DC | PRN
  Administered 2021-02-22: 2 mg via INTRAVENOUS

## 2021-02-22 MED ORDER — PHENYLEPHRINE 40 MCG/ML (10ML) SYRINGE FOR IV PUSH (FOR BLOOD PRESSURE SUPPORT)
PREFILLED_SYRINGE | INTRAVENOUS | Status: AC
  Filled 2021-02-22: qty 30

## 2021-02-22 MED ORDER — ORAL CARE MOUTH RINSE: 15.0000 mL | Freq: Once | OROMUCOSAL | Status: AC

## 2021-02-22 MED ORDER — MIDAZOLAM HCL 2 MG/2ML IJ SOLN
INTRAMUSCULAR | Status: AC
  Filled 2021-02-22: qty 2

## 2021-02-22 MED ORDER — ROCURONIUM BROMIDE 10 MG/ML (PF) SYRINGE
PREFILLED_SYRINGE | INTRAVENOUS | Status: AC
  Filled 2021-02-22: qty 10

## 2021-02-22 MED ORDER — PHENYLEPHRINE 40 MCG/ML (10ML) SYRINGE FOR IV PUSH (FOR BLOOD PRESSURE SUPPORT)
PREFILLED_SYRINGE | INTRAVENOUS | Status: AC
  Filled 2021-02-22: qty 40

## 2021-02-22 MED ORDER — OXYCODONE HCL 5 MG PO TABS
10.0000 mg | ORAL_TABLET | ORAL | Status: DC | PRN
  Administered 2021-02-22 – 2021-02-23 (×3): 10 mg via ORAL
  Filled 2021-02-22 (×3): qty 2

## 2021-02-22 MED ORDER — DIPHENHYDRAMINE HCL 12.5 MG/5ML PO ELIX: 12.5000 mg | ORAL_SOLUTION | ORAL | Status: DC | PRN

## 2021-02-22 MED ORDER — SUGAMMADEX SODIUM 500 MG/5ML IV SOLN
INTRAVENOUS | Status: DC | PRN
  Administered 2021-02-22: 200 mg via INTRAVENOUS

## 2021-02-22 MED ORDER — EPHEDRINE SULFATE 50 MG/ML IJ SOLN
INTRAMUSCULAR | Status: DC | PRN
  Administered 2021-02-22: 5 mg via INTRAVENOUS

## 2021-02-22 MED ORDER — SODIUM CHLORIDE 0.9 % IR SOLN
Status: DC | PRN
  Administered 2021-02-22: 3000 mL
  Administered 2021-02-22: 1000 mL

## 2021-02-22 MED ORDER — FENTANYL CITRATE (PF) 250 MCG/5ML IJ SOLN
INTRAMUSCULAR | Status: AC
  Filled 2021-02-22: qty 5

## 2021-02-22 MED ORDER — ONDANSETRON HCL 4 MG/2ML IJ SOLN
INTRAMUSCULAR | Status: AC
  Filled 2021-02-22: qty 2

## 2021-02-22 MED ORDER — ROPIVACAINE HCL 5 MG/ML IJ SOLN
INTRAMUSCULAR | Status: AC
  Filled 2021-02-22: qty 30

## 2021-02-22 MED ORDER — LACTATED RINGERS IV SOLN
INTRAVENOUS | Status: DC
  Administered 2021-02-22: 07:00:00 1000 mL via INTRAVENOUS

## 2021-02-22 MED ORDER — ONDANSETRON HCL 4 MG PO TABS: 4.0000 mg | ORAL_TABLET | Freq: Four times a day (QID) | ORAL | Status: DC | PRN

## 2021-02-22 MED ORDER — DEXAMETHASONE SODIUM PHOSPHATE 10 MG/ML IJ SOLN
INTRAMUSCULAR | Status: DC | PRN
  Administered 2021-02-22: 10 mg via INTRAVENOUS

## 2021-02-22 MED ORDER — DEXAMETHASONE SODIUM PHOSPHATE 10 MG/ML IJ SOLN
INTRAMUSCULAR | Status: AC
  Filled 2021-02-22: qty 1

## 2021-02-22 MED ORDER — EPHEDRINE 5 MG/ML INJ
INTRAVENOUS | Status: AC
  Filled 2021-02-22: qty 5

## 2021-02-22 MED ORDER — PHENYLEPHRINE HCL (PRESSORS) 10 MG/ML IV SOLN
INTRAVENOUS | Status: DC | PRN
  Administered 2021-02-22 (×5): 100 ug via INTRAVENOUS

## 2021-02-22 MED ORDER — FENTANYL CITRATE (PF) 100 MCG/2ML IJ SOLN
INTRAMUSCULAR | Status: DC | PRN
  Administered 2021-02-22 (×2): 50 ug via INTRAVENOUS

## 2021-02-22 SURGICAL SUPPLY — 76 items
APL PRP STRL LF DISP 70% ISPRP (MISCELLANEOUS) ×2
BASEPLATE AUG FULL 24 20D (Plate) ×1 IMPLANT
BIT DRILL FLUTED 3.0 STRL (BIT) ×1 IMPLANT
BLADE SAW SGTL 83.5X18.5 (BLADE) ×2 IMPLANT
BLADE SURG SZ10 CARB STEEL (BLADE) ×4 IMPLANT
BNDG GAUZE ELAST 4 BULKY (GAUZE/BANDAGES/DRESSINGS) ×3 IMPLANT
BSPLAT GLND 20D OBLQ 24 FULL (Plate) ×1 IMPLANT
CHLORAPREP W/TINT 26 (MISCELLANEOUS) ×4 IMPLANT
CLOTH BEACON ORANGE TIMEOUT ST (SAFETY) ×2 IMPLANT
COOLER ICEMAN CLASSIC (MISCELLANEOUS) ×2 IMPLANT
COVER LIGHT HANDLE STERIS (MISCELLANEOUS) ×4 IMPLANT
CUFF CRYO UNI SHDR 32X48 (MISCELLANEOUS) ×2 IMPLANT
CUP SUT UNIV REVERS 36 NEUTRAL (Cup) ×1 IMPLANT
DRAPE HALF SHEET 40X57 (DRAPES) ×2 IMPLANT
DRAPE INCISE IOBAN 44X35 STRL (DRAPES) ×2 IMPLANT
DRAPE SHOULDER BEACH CHAIR (DRAPES) ×2 IMPLANT
DRAPE U-SHAPE 47X51 STRL (DRAPES) ×2 IMPLANT
DRESSING AQUACEL AG ADV 3.5X12 (MISCELLANEOUS) ×1 IMPLANT
DRSG AQUACEL AG ADV 3.5X12 (MISCELLANEOUS) ×2
ELECT REM PT RETURN 9FT ADLT (ELECTROSURGICAL) ×2
ELECTRODE REM PT RTRN 9FT ADLT (ELECTROSURGICAL) ×1 IMPLANT
GLENOSPHERE 36 +4 LAT/24 (Joint) ×1 IMPLANT
GLOVE SRG 8 PF TXTR STRL LF DI (GLOVE) ×1 IMPLANT
GLOVE SURG POLYISO LF SZ7 (GLOVE) ×3 IMPLANT
GLOVE SURG POLYISO LF SZ8 (GLOVE) ×4 IMPLANT
GLOVE SURG UNDER POLY LF SZ7 (GLOVE) ×6 IMPLANT
GLOVE SURG UNDER POLY LF SZ8 (GLOVE) ×2
GOWN STRL REUS W/ TWL XL LVL3 (GOWN DISPOSABLE) ×1 IMPLANT
GOWN STRL REUS W/TWL LRG LVL3 (GOWN DISPOSABLE) ×4 IMPLANT
GOWN STRL REUS W/TWL XL LVL3 (GOWN DISPOSABLE) ×2
HANDPIECE INTERPULSE COAX TIP (DISPOSABLE) ×2
HOOD W/PEELAWAY (MISCELLANEOUS) ×6 IMPLANT
INST SET MINOR BONE (KITS) ×2 IMPLANT
IV NS IRRIG 3000ML ARTHROMATIC (IV SOLUTION) ×2 IMPLANT
KIT POSITION SHOULDER SCHLEI (MISCELLANEOUS) ×2 IMPLANT
KIT TURNOVER KIT A (KITS) ×2 IMPLANT
LINER HUMERAL 36 +3MM SM (Shoulder) ×1 IMPLANT
MANIFOLD NEPTUNE II (INSTRUMENTS) ×2 IMPLANT
MARKER SKIN DUAL TIP RULER LAB (MISCELLANEOUS) ×2 IMPLANT
NDL MAYO TROCAR (NEEDLE) IMPLANT
NEEDLE MAYO TROCAR (NEEDLE) ×2 IMPLANT
NS IRRIG 1000ML POUR BTL (IV SOLUTION) ×2 IMPLANT
PACK BASIC III (CUSTOM PROCEDURE TRAY) ×2
PACK SRG BSC III STRL LF ECLPS (CUSTOM PROCEDURE TRAY) IMPLANT
PACK TOTAL JOINT (CUSTOM PROCEDURE TRAY) ×2 IMPLANT
PAD ABD 5X9 TENDERSORB (GAUZE/BANDAGES/DRESSINGS) ×6 IMPLANT
PASSER SUT SWANSON 36MM LOOP (INSTRUMENTS) ×1 IMPLANT
PENCIL SMOKE EVACUATOR (MISCELLANEOUS) ×1 IMPLANT
PIN NITINOL TARGETER 2.8 (PIN) ×2 IMPLANT
POST MODULAR 25 (Post) ×2 IMPLANT
POST MODULAR MGS BASEPLATE 25 (Post) IMPLANT
REAMER ANGLED HEAD SMALL (DRILL) ×1 IMPLANT
SCREW PERI LOCK 5.5X16 (Screw) ×1 IMPLANT
SCREW PERI LOCK 5.5X32 (Screw) ×1 IMPLANT
SCREW PERIPHERAL 5.5X20 LOCK (Screw) ×1 IMPLANT
SCREW PERIPHERAL NL 4.5X28 (Screw) ×1 IMPLANT
SET BASIN LINEN APH (SET/KITS/TRAYS/PACK) ×2 IMPLANT
SET HNDPC FAN SPRY TIP SCT (DISPOSABLE) IMPLANT
SLING ULTRA II L (ORTHOPEDIC SUPPLIES) ×1 IMPLANT
SPONGE T-LAP 18X18 ~~LOC~~+RFID (SPONGE) ×5 IMPLANT
STEM HUMERAL UNI REVERS SZ6 (Stem) ×1 IMPLANT
STRIP CLOSURE SKIN 1/2X4 (GAUZE/BANDAGES/DRESSINGS) ×4 IMPLANT
SUT ETHIBOND NAB OS 4 #2 30IN (SUTURE) ×1 IMPLANT
SUT MNCRL AB 4-0 PS2 18 (SUTURE) ×2 IMPLANT
SUT MON AB 2-0 CT1 36 (SUTURE) ×2 IMPLANT
SUT VIC AB 0 CT1 27 (SUTURE) ×2
SUT VIC AB 0 CT1 27XBRD ANTBC (SUTURE) ×1 IMPLANT
SUTURE TAPE 1.3 40 TPR END (SUTURE) ×2 IMPLANT
SUTURETAPE 1.3 40 TPR END (SUTURE) ×6
SUTURETAPE 1.3 40 W/NDL BLK/WH (SUTURE) ×5 IMPLANT
SYR BULB IRRIG 60ML STRL (SYRINGE) ×2 IMPLANT
TOWEL OR 17X26 4PK STRL BLUE (TOWEL DISPOSABLE) ×2 IMPLANT
TRAY FOLEY W/BAG SLVR 16FR (SET/KITS/TRAYS/PACK) ×2
TRAY FOLEY W/BAG SLVR 16FR ST (SET/KITS/TRAYS/PACK) ×1 IMPLANT
WATER STERILE IRR 1000ML POUR (IV SOLUTION) ×2 IMPLANT
YANKAUER SUCT 12FT TUBE ARGYLE (SUCTIONS) ×2 IMPLANT

## 2021-02-22 NOTE — Addendum Note (Signed)
Addendum  created 02/22/21 1344 by Shanon Payor, CRNA   Charge Capture section accepted

## 2021-02-22 NOTE — Anesthesia Procedure Notes (Signed)
Anesthesia Regional Block: Interscalene brachial plexus block   Pre-Anesthetic Checklist: , timeout performed,  Correct Patient, Correct Site, Correct Laterality,  Correct Procedure, Correct Position, site marked,  Risks and benefits discussed,  Surgical consent,  Pre-op evaluation,  At surgeon's request and post-op pain management  Laterality: Left  Prep: chloraprep       Needles:   Needle Type: Stimiplex     Needle Length: 5cm  Needle Gauge: 22     Additional Needles:   Procedures:, nerve stimulator,,, ultrasound used (permanent image in chart),,   Motor weakness within 5 minutes.   Nerve Stimulator or Paresthesia:  Response: Thenar twitch, 0.4 mA, 2 cm  Additional Responses:   Narrative:  Start time: 02/22/2021 7:08 AM End time: 02/22/2021 7:19 AM  Performed by: With CRNAs  Anesthesiologist: Windell Norfolk, MD CRNA: Shanon Payor, CRNA  Additional Notes: No complications

## 2021-02-22 NOTE — Op Note (Signed)
Orthopaedic Surgery Operative Note (CSN: [id number])  Kathryn Woods  [birth year] Date of Surgery: 02/22/2021   Diagnoses:  Left shoulder pain, with failed rotator cuff repair  Procedure: Left reverse shoulder arthroplasty   Operative Finding Successful completion of the planned procedure.  Rotator cuff repair failed.  Patient had some humeral head wear, as well as some posterior and superior glenoid wear.  Placement of reverse shoulder arthroplasty without complications.    Post-Op Diagnosis: Same Surgeons:Primary: Oliver Barre, MD Assistants:  Cecile Sheerer Location: [facility] OR ROOM 4 Anesthesia: General with regional anesthesia Antibiotics: Ancef 2 g with local vancomycin powder 1 g at the surgical site Tourniquet time: N/A Estimated Blood Loss: 200 cc Complications: None Specimens: None Implants: Implant Name Type Inv. Item Serial No. Manufacturer Lot No. LRB No. Used Action  POST MODULAR 25 - [id number] Post POST MODULAR 25  [employer] [id number] Left 1 Implanted  BASEPLATE AUG FULL 24 20D - [id number] Plate BASEPLATE AUG FULL 24 20D  [employer] [id number] Left 1 Implanted  SCREW PERIPHERAL NL 4.5X28 - [id number] Screw SCREW PERIPHERAL NL 4.5X28  [employer] [id number] Left 1 Implanted  SCREW PERI LOCK 5.5X32 - [id number] Screw SCREW PERI LOCK 5.5X32  [employer] [id number] Left 1 Implanted  SCREW PERI LOCK 5.5X16 - [id number] Screw SCREW PERI LOCK 5.5X16  [employer] [id number] Left 1 Implanted  SCREW PERIPHERAL 5.5X20 LOCK - [id number] Screw SCREW PERIPHERAL 5.5X20 LOCK  [employer] [id number] Left 1 Implanted  GLENOSPHERE 36 +4 LAT/24 - [id number] Joint GLENOSPHERE 36 +4 LAT/24  [employer] [id number] Left 1 Implanted  STEM HUMERAL UNI REVERS SZ6 - [id number] Stem STEM HUMERAL UNI REVERS SZ6  [employer] [id number] Left 1 Implanted  CUP SUT UNIV REVERS 36 NEUTRAL - [id number] Cup CUP SUT UNIV REVERS 36 NEUTRAL  [employer] [id number] Left 1 Implanted  LINER HUMERAL 36 +3MM SM -  [id number] Shoulder LINER HUMERAL 36 +3MM SM  [employer] [id number] Left 1 Implanted    Indications for Surgery:   Kathryn Woods is a 70 y.o. female who previously underwent a left rotator cuff repair.  Subsequently, she did not heal this rotator cuff repair.  As result, she developed pain, and dysfunction in her left shoulder.  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  As a result, I offered her a reverse shoulder arthroplasty.  Benefits and risks of operative and nonoperative management were discussed prior to surgery with the patient and informed consent form was completed.  Specific risks including, but not limited to, infection, need for additional surgery, bleeding, persistent pain, non-union, implant loosening, malunion, persistent pain, stiffness, dislocation and more severe complications associated with anesthesia were discussed with the patient.  The patient has elected to proceed.   Procedure:   The patient was identified properly. Informed consent was obtained and the surgical site was marked. The patient was taken up to operating room where general anesthesia was induced.  The patient was positioned in beach chair position.  The left shoulder was prepped and draped in the usual sterile fashion.  Timeout was performed before the beginning of the case.  The patient received appropriate antibiotics prior to making incision.  In addition, the patient received 1 g TXA prior to starting.  This was confirmed during the preincision timeout.  We made an incision for the standard deltopectoral approach was performed with a #10 blade. We dissected down through the subcutaneous tissues and the cephalic vein was taken laterally  with the deltoid. The clavipectoral fascia was incised in line with the incision. Deep retractors were placed. The long of the biceps tendon was identified and there was significant tenosynovitis present.  A biceps tenodesis was  performed to the pectoralis tendon with #2 Fiberwire. The remaining biceps was followed up into the rotator interval where it was released.   The subscapularis was taken down in a full thickness layer with capsule along the humeral neck extending inferiorly around the humeral head. We continued releasing the capsule directly off of the osteophytes inferiorly all the way around the corner. This allowed [place] to dislocate the humeral head. Multiple tag stitches were placed in the subscapularis tendon to maintain control during the release of the tendon, and for the remainder of the case.   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  The anterior rotator cuff was torn as well, with retraction of approximately 2 cm.  There were some small osteophytes along the inferior humeral neck. The osteophytes were removed with an osteotome and a rongeur.  Osteophytes were removed with a rongeur and an osteotome and the anatomic neck was well visualized.     A humeral cutting guide was secured to the anterior aspect of the humeral head. The version was set at 20 of retroversion. A humeral osteotomy was performed with an oscillating saw. The head fragment was passed off to the back table, and used to estimate the humeral head size for our implant.  A cut protector was placed.  The humerus was retracted posteriorly and we turned our attention to glenoid exposure.  The subscapularis was again identified and we took care to palpate the axillary nerve anteriorly and verify its position with gentle palpation as well as the tug test.  We then released the SGHL with bovie cautery prior to placing a curved mayo at the junction of the anterior glenoid well above the axillary nerve and bluntly dissecting the subscapularis from the capsule.  We then carefully protected the axillary nerve as we gently released the inferior capsule to fully mobilize the subscapularis.  An  anterior deltoid retractor was then placed as well as a small Hohmann retractor superiorly.   The glenoid was inspected and had evidence of osteoarthritic wear, especially in the posterior and superior aspect of the glenoid.  There were small amounts of full-thickness cartilage loss and exposed subchondral bone. The remaining labrum was removed circumferentially taking great care not to disrupt the posterior capsule.   The glenoid drill guide based on her preoperative template was placed and used to drill a guide pin in the center, inferior position. The glenoid face was then reamed eccentrically over the guide wire, with a plan for an augment in the posterior and superior aspect of the glenoid. The center hole was drilled over the guidepin in a near anatomic angle of version. Next the glenoid vault was drilled back to a depth of 30 mm.  We then placed a 24 mm size baseplate with 2 mm lateralization.  This was press-fit into place, with the 20 degree augment in the posterior and superior aspect of the glenoid. The base plate was pressed into the glenoid vault obtaining secure fixation. We next placed superior and inferior non-locking screws for additional fixation, with additional locking screws for secure fixation.  Next a 36 mm glenosphere was selected and impacted onto the baseplate. The center screw was tightened.  We turned attention back to the humeral  side. The cut protector was removed.  A starter awl was used to open the humeral canal. We next used T-handle straight reamers to ream up to an appropriate fit. We then broached starting with a size 5 broach and broaching up to a 6 which obtained an appropriate fit. The broach handle was removed.   We trialed with multiple size tray and polyethylene options and selected a 3 mm which provided good stability and range of motion without excess soft tissue tension. The offset was dialed in to match the normal anatomy. The shoulder was trialed.  There was good  ROM in all planes and the shoulder was stable with no inferior translation.  The real humeral implants were opened after again confirming sizes.  The trial was removed. #5 Fiberwire x4 sutures passed through the humeral neck for subscap repair. The humeral component was press-fit obtaining a secure fit. A neutral offset tray was selected and impacted onto the stem.  A 36+3 polyethylene liner was impacted onto the stem.  The joint was reduced and thoroughly irrigated with pulsatile lavage.  We placed 1 g of Vancomycin powder directly over the implants.  The subscapularis was repaired back with #2 Fiberwire sutures through bone tunnels. Hemostasis was obtained. The deltopectoral interval was reapproximated with #1 Vicryl. The subcutaneous tissues were closed with 2-0 monocryl and the skin was closed with running monocryl.    The wounds were cleaned and dried and an Aquacel dressing was placed. The drapes taken down. The arm was placed into sling with abduction pillow. Patient was awakened, extubated, and transferred to the recovery room in stable condition. There were no intraoperative complications.   Post-operative plan:  The patient will be admitted for over night observation.   We have placed a referral for PT to begin 1-2 weeks postop, prior to the first postoperative clinic visit.  DVT prophylaxis Aspirin 81 mg twice daily for 6 weeks.    Pain control with PRN pain medication preferring oral medicines.   Follow up plan will be scheduled in approximately 10-14 days for incision check and XR.

## 2021-02-22 NOTE — Anesthesia Preprocedure Evaluation (Signed)
Anesthesia Evaluation  Patient identified by MRN, date of birth, ID band Patient awake    Reviewed: Allergy & Precautions, H&P , NPO status , Patient's Chart, lab work & pertinent test results, reviewed documented beta blocker date and time   Airway Mallampati: II  TM Distance: >3 FB Neck ROM: full    Dental no notable dental hx.    Pulmonary neg pulmonary ROS, former smoker,    Pulmonary exam normal breath sounds clear to auscultation       Cardiovascular Exercise Tolerance: Good negative cardio ROS   Rhythm:regular Rate:Normal     Neuro/Psych  Headaches, negative psych ROS   GI/Hepatic negative GI ROS, Neg liver ROS,   Endo/Other  negative endocrine ROS  Renal/GU negative Renal ROS  negative genitourinary   Musculoskeletal   Abdominal   Peds  Hematology negative hematology ROS (+)   Anesthesia Other Findings   Reproductive/Obstetrics negative OB ROS                             Anesthesia Physical Anesthesia Plan  ASA: 1  Anesthesia Plan: General and General ETT   Post-op Pain Management:  Regional for Post-op pain and GA combined w/ Regional for post-op pain   Induction:   PONV Risk Score and Plan:   Airway Management Planned:   Additional Equipment:   Intra-op Plan:   Post-operative Plan:   Informed Consent: I have reviewed the patients History and Physical, chart, labs and discussed the procedure including the risks, benefits and alternatives for the proposed anesthesia with the patient or authorized representative who has indicated his/her understanding and acceptance.     Dental Advisory Given  Plan Discussed with: CRNA  Anesthesia Plan Comments:         Anesthesia Quick Evaluation

## 2021-02-22 NOTE — Transfer of Care (Signed)
Immediate Anesthesia Transfer of Care Note  Patient: Kathryn Woods  Procedure(s) Performed: LEFT REVERSE SHOULDER ARTHROPLASTY (Left: Shoulder)  Patient Location: PACU  Anesthesia Type:General  Level of Consciousness: awake, alert , oriented and patient cooperative  Airway & Oxygen Therapy: Patient Spontanous Breathing and Patient connected to nasal cannula oxygen  Post-op Assessment: Report given to RN and Post -op Vital signs reviewed and stable  Post vital signs: Reviewed and stable  Last Vitals:  Vitals Value Taken Time  BP    Temp    Pulse    Resp    SpO2      Last Pain:  Vitals:   02/22/21 0646  TempSrc: Oral  PainSc: 7       Patients Stated Pain Goal: 10 (02/22/21 0646)  Complications: No notable events documented.

## 2021-02-22 NOTE — Anesthesia Postprocedure Evaluation (Signed)
Anesthesia Post Note  Patient: Kathryn Woods  Procedure(s) Performed: LEFT REVERSE SHOULDER ARTHROPLASTY (Left: Shoulder)  Patient location during evaluation: Phase II Anesthesia Type: General Level of consciousness: awake Pain management: pain level controlled Vital Signs Assessment: post-procedure vital signs reviewed and stable Respiratory status: spontaneous breathing and respiratory function stable Cardiovascular status: blood pressure returned to baseline and stable Postop Assessment: no headache and no apparent nausea or vomiting Anesthetic complications: no Comments: Late entry   No notable events documented.   Last Vitals:  Vitals:   02/22/21 1057 02/22/21 1115  BP: (!) 174/80 (!) 175/86  Pulse:  86  Resp: 16 15  Temp: 37.2 C   SpO2: 98% 98%    Last Pain:  Vitals:   02/22/21 1115  TempSrc:   PainSc: 0-No pain                 Windell Norfolk

## 2021-02-22 NOTE — Anesthesia Procedure Notes (Signed)
Procedure Name: Intubation Date/Time: 02/22/2021 7:42 AM Performed by: Jonna Munro, CRNA Pre-anesthesia Checklist: Patient identified, Emergency Drugs available, Suction available, Patient being monitored and Timeout performed Patient Re-evaluated:Patient Re-evaluated prior to induction Oxygen Delivery Method: Circle system utilized Preoxygenation: Pre-oxygenation with 100% oxygen Induction Type: IV induction Ventilation: Mask ventilation without difficulty Laryngoscope Size: Mac and 3 Grade View: Grade I Tube type: Oral Tube size: 7.0 mm Number of attempts: 1 Airway Equipment and Method: Stylet Placement Confirmation: ETT inserted through vocal cords under direct vision, positive ETCO2 and breath sounds checked- equal and bilateral Secured at: 22 cm Tube secured with: Tape Dental Injury: Teeth and Oropharynx as per pre-operative assessment

## 2021-02-22 NOTE — Plan of Care (Signed)

## 2021-02-22 NOTE — Interval H&P Note (Signed)
History and Physical Interval Note:  02/22/2021 7:13 AM  Kathryn Woods  has presented today for surgery, with the diagnosis of Left shoulder pain, with failed rotator cuff repair.  The various methods of treatment have been discussed with the patient and family. After consideration of risks, benefits and other options for treatment, the patient has consented to  Procedure(s): LEFT REVERSE SHOULDER ARTHROPLASTY (Left) as a surgical intervention.  The patient's history has been reviewed, patient examined, no change in status, stable for surgery.  I have reviewed the patient's chart and labs.  Questions were answered to the patient's satisfaction.     Oliver Barre

## 2021-02-22 NOTE — Progress Notes (Signed)
Pt arrived to room #327 via bed from PACU at 1318. Pt awake but drowsy, denies any c/o pain. VSS. Iceman cooling device intact to shoulder. DDI with small area of dried drainage noted. Left hand/fingers warm, cap refill <3 sec, good movement and sensation. IV intact, no s/s infiltration. Foley intact draining clear yellow urine.

## 2021-02-23 ENCOUNTER — Encounter (HOSPITAL_COMMUNITY): Payer: Self-pay | Admitting: Orthopedic Surgery

## 2021-02-23 ENCOUNTER — Other Ambulatory Visit: Payer: Self-pay

## 2021-02-23 DIAGNOSIS — M75102 Unspecified rotator cuff tear or rupture of left shoulder, not specified as traumatic: Secondary | ICD-10-CM | POA: Diagnosis not present

## 2021-02-23 DIAGNOSIS — G8929 Other chronic pain: Secondary | ICD-10-CM

## 2021-02-23 DIAGNOSIS — M25512 Pain in left shoulder: Secondary | ICD-10-CM

## 2021-02-23 DIAGNOSIS — M75122 Complete rotator cuff tear or rupture of left shoulder, not specified as traumatic: Secondary | ICD-10-CM

## 2021-02-23 LAB — CBC
HCT: 33.9 % — ABNORMAL LOW (ref 36.0–46.0)
Hemoglobin: 10.7 g/dL — ABNORMAL LOW (ref 12.0–15.0)
MCH: 29.2 pg (ref 26.0–34.0)
MCHC: 31.6 g/dL (ref 30.0–36.0)
MCV: 92.4 fL (ref 80.0–100.0)
Platelets: 270 10*3/uL (ref 150–400)
RBC: 3.67 MIL/uL — ABNORMAL LOW (ref 3.87–5.11)
RDW: 14.5 % (ref 11.5–15.5)
WBC: 16.9 10*3/uL — ABNORMAL HIGH (ref 4.0–10.5)
nRBC: 0 % (ref 0.0–0.2)

## 2021-02-23 MED ORDER — ONDANSETRON HCL 4 MG PO TABS: 4.0000 mg | ORAL_TABLET | Freq: Three times a day (TID) | ORAL | 0 refills | Status: AC | PRN

## 2021-02-23 MED ORDER — ACETAMINOPHEN 500 MG PO TABS: 1000.0000 mg | ORAL_TABLET | Freq: Three times a day (TID) | ORAL | 0 refills | Status: AC

## 2021-02-23 MED ORDER — CELECOXIB 100 MG PO CAPS: 100.0000 mg | ORAL_CAPSULE | Freq: Every day | ORAL | 0 refills | Status: AC

## 2021-02-23 MED ORDER — OXYCODONE HCL 5 MG PO TABS: 5.0000 mg | ORAL_TABLET | ORAL | 0 refills | Status: AC | PRN

## 2021-02-23 NOTE — Evaluation (Signed)
Occupational Therapy Evaluation Patient Details Name: Kathryn Woods MRN: [id number] DOB: [birth year] Today's Date: 02/23/2021   History of Present Illness Zoila Ditullio  has presented today for surgery, with the diagnosis of Left shoulder pain, with failed rotator cuff repair.  The various methods of treatment have been discussed with the patient and family. After consideration of risks, benefits and other options for treatment, the patient has consented to  Procedure(s):  LEFT REVERSE SHOULDER ARTHROPLASTY (Left) as a surgical intervention.   Clinical Impression   Pt agreeable to OT/PT co-evaluation. Pt reports family will be able to assist 24/7 for a week when returning home. Pt was able to complete bed mobility with modified independence. Pt able to ambulate without assist as well. Pt required min A for donning gown with assist to adjust  shoulder immobilizer to adducted and 90* elbow flexion. Pt is not recommended for further acute OT services and will be discharged to care of nursing staff for remaining length of stay.      Recommendations for follow up therapy are one component of a multi-disciplinary discharge planning process, led by the attending physician.  Recommendations may be updated based on patient status, additional functional criteria and insurance authorization.   Follow Up Recommendations  Outpatient OT    Assistance Recommended at Discharge PRN  Functional Status Assessment  Patient has had a recent decline in their functional status and demonstrates the ability to make significant improvements in function in a reasonable and predictable amount of time.  Equipment Recommendations  None recommended by OT    Recommendations for Other Services       Precautions / Restrictions Precautions Precautions: None Precaution Comments: L shoulder immobilizer Restrictions Weight Bearing Restrictions: Yes LUE Weight Bearing: Non weight bearing       Mobility Bed Mobility Overal bed mobility: Modified Independent                  Transfers Overall transfer level: Modified independent                        Balance Overall balance assessment: Independent                                         ADL either performed or assessed with clinical judgement   ADL Overall ADL's : Needs assistance/impaired                 Upper Body Dressing : Minimal assistance;Sitting Upper Body Dressing Details (indicate cue type and reason): Assited in donning gown and adjusting shoulder immobilizer.                 Functional mobility during ADLs: Modified independent General ADL Comments: Pt has family to assist at home with ADL's as needed.     Vision Baseline Vision/History: 1 Wears glasses (reading) Ability to See in Adequate Light: 0 Adequate Patient Visual Report:  (Pt had blurred vision in R eye around time of surgery but has retruned to normal.) Vision Assessment?: No apparent visual deficits     Perception     Praxis      Pertinent Vitals/Pain Pain Assessment: 0-10 Pain Score: 6  Pain Location: L shoulder Pain Descriptors / Indicators: Guarding Pain Intervention(s): Limited activity within patient's tolerance;Monitored during session;Repositioned     Hand Dominance Right   Extremity/Trunk Assessment Upper Extremity Assessment Upper  Extremity Assessment: LUE deficits/detail LUE Deficits / Details: L UE immobilized after L reverse shoulder arthroplasty.   Lower Extremity Assessment Lower Extremity Assessment: Defer to PT evaluation   Cervical / Trunk Assessment Cervical / Trunk Assessment: Normal   Communication Communication Communication: No difficulties   Cognition Arousal/Alertness: Awake/alert Behavior During Therapy: WFL for tasks assessed/performed Overall Cognitive Status: Within Functional Limits for tasks assessed                                        General Comments       Exercises     Shoulder Instructions      Home Living Family/patient expects to be discharged to:: Private residence Living Arrangements: Children Available Help at Discharge: Available 24 hours/day;Family Type of Home: House Home Access: Level entry     Home Layout: One level     Bathroom Shower/Tub: Producer, television/film/video: Standard     Home Equipment: Grab bars - toilet   Additional Comments: Daughter will be available 24/7 for one week.      Prior Functioning/Environment Prior Level of Function : Independent/Modified Independent             Mobility Comments: Pt ambulates wihtout AD. ADLs Comments: Pt reports independence.        OT Problem List: Decreased strength;Decreased range of motion;Impaired UE functional use      OT Treatment/Interventions:      OT Goals(Current goals can be found in the care plan section) Acute Rehab OT Goals Patient Stated Goal: return home  OT Frequency:     Barriers to D/C:            Co-evaluation PT/OT/SLP Co-Evaluation/Treatment: Yes Reason for Co-Treatment: To address functional/ADL transfers   OT goals addressed during session: ADL's and self-care      AM-PAC OT "6 Clicks" Daily Activity     Outcome Measure Help from another person eating meals?: None Help from another person taking care of personal grooming?: A Little Help from another person toileting, which includes using toliet, bedpan, or urinal?: A Little Help from another person bathing (including washing, rinsing, drying)?: A Little Help from another person to put on and taking off regular upper body clothing?: A Little Help from another person to put on and taking off regular lower body clothing?: A Little 6 Click Score: 19   End of Session    Activity Tolerance: Patient tolerated treatment well Patient left: in chair;with call bell/phone within reach  OT Visit Diagnosis: Muscle weakness  (generalized) (M62.81)                Time: 3419-6222 OT Time Calculation (min): 11 min Charges:  OT General Charges $OT Visit: 1 Visit OT Evaluation $OT Eval Low Complexity: 1 Low  Izmael Duross OT, MOT  Danie Chandler 02/23/2021, 9:42 AM

## 2021-02-23 NOTE — Plan of Care (Signed)

## 2021-02-23 NOTE — TOC Transition Note (Signed)
Transition of Care [facility]) - CM/SW Discharge Note   Patient Details  Name: Kathryn Woods MRN: [id number] Date of Birth: [birth year]  [facility] CM/SW Contact:  Villa Herb, LCSWA Phone Number: 02/23/2021, 9:19 AM   Clinical Narrative:    CSW updated that PT is recommending OP PT for pt at d/c. CSW spoke with pt about interest in OP PT referral being made. Pt is agreeable and understanding that the outpatient rehab will reach out to her to set up an appointment. TOC signing off.    Final next level of care: OP Rehab Barriers to Discharge: Barriers Resolved   Patient Goals and CMS Choice Patient states their goals for this hospitalization and ongoing recovery are:: Go to outpatient PT CMS Medicare.gov Compare Post Acute Care list provided to:: Patient Choice offered to / list presented to : Patient  Discharge Placement                       Discharge Plan and Services                                     Social Determinants of Health (SDOH) Interventions     Readmission Risk Interventions No flowsheet data found.

## 2021-02-23 NOTE — Evaluation (Addendum)
Physical Therapy Evaluation Patient Details Name: Kathryn Woods MRN: [id number] DOB: 12-18-1950 Today's Date: 02/23/2021  History of Present Illness  Kathryn Woods  has presented today for surgery, with the diagnosis of Left shoulder pain, with failed rotator cuff repair.  The various methods of treatment have been discussed with the patient and family. After consideration of risks, benefits and other options for treatment, the patient has consented to  Procedure(s):  LEFT REVERSE SHOULDER ARTHROPLASTY (Left) as a surgical intervention.   Clinical Impression  Patient functioning at baseline for functional mobility and gait, requires increased time to complete bed mobility d/t L shoulder being immobilized s/p L reverse shoulder arthroplasty. Patient ambulated independently in hallway w/out AD, demonstrated a good base of support, steadiness on feet, good arm swing on the R side, and good cadence, no loss of balance. Patient discharged to care of nursing for ambulation daily as tolerated for length of stay.        Recommendations for follow up therapy are one component of a multi-disciplinary discharge planning process, led by the attending physician.  Recommendations may be updated based on patient status, additional functional criteria and insurance authorization.  Follow Up Recommendations Follow physician's recommendations for discharge plan and follow up therapies (Outpatient PT)    Assistance Recommended at Discharge Set up Supervision/Assistance  Functional Status Assessment Patient has not had a recent decline in their functional status  Equipment Recommendations  None recommended by PT    Recommendations for Other Services       Precautions / Restrictions Precautions Precautions: None Precaution Comments: L shoulder immobilizer Restrictions Weight Bearing Restrictions: Yes LUE Weight Bearing: Non weight bearing      Mobility  Bed Mobility Overal bed  mobility: Modified Independent                  Transfers Overall transfer level: Modified independent                      Ambulation/Gait Ambulation/Gait assistance: Independent Gait Distance (Feet): 120 Feet Assistive device: None Gait Pattern/deviations: WFL(Within Functional Limits) Gait velocity: Normal     General Gait Details: Good base of support and good R arm swing.  Stairs            Wheelchair Mobility    Modified Rankin (Stroke Patients Only)       Balance Overall balance assessment: Independent                                           Pertinent Vitals/Pain Pain Assessment: 0-10 Pain Score: 6  Pain Location: L shoulder Pain Descriptors / Indicators: Guarding Pain Intervention(s): Limited activity within patient's tolerance;Monitored during session;Repositioned    Home Living Family/patient expects to be discharged to:: Private residence Living Arrangements: Children Available Help at Discharge: Available 24 hours/day;Family Type of Home: House Home Access: Level entry       Home Layout: One level Home Equipment: Grab bars - toilet Additional Comments: Daughter will be available 24/7 for one week.    Prior Function Prior Level of Function : Independent/Modified Independent             Mobility Comments: Pt ambulates wihtout AD. ADLs Comments: Pt reports independence.     Hand Dominance   Dominant Hand: Right    Extremity/Trunk Assessment   Upper Extremity Assessment Upper Extremity Assessment: Defer  to OT evaluation LUE Deficits / Details: L UE immobilized after L reverse shoulder arthroplasty.    Lower Extremity Assessment Lower Extremity Assessment: Overall WFL for tasks assessed    Cervical / Trunk Assessment Cervical / Trunk Assessment: Normal  Communication   Communication: No difficulties  Cognition Arousal/Alertness: Awake/alert Behavior During Therapy: WFL for tasks  assessed/performed Overall Cognitive Status: Within Functional Limits for tasks assessed                                          General Comments      Exercises     Assessment/Plan    PT Assessment All further PT needs can be met in the next venue of care  PT Problem List Other (comment) (S/p L reverse total shoulder arthroplasty)       PT Treatment Interventions      PT Goals (Current goals can be found in the Care Plan section)  Acute Rehab PT Goals Patient Stated Goal: Return home. PT Goal Formulation: With patient Time For Goal Achievement: 02/23/21 Potential to Achieve Goals: Good    Frequency     Barriers to discharge        Co-evaluation PT/OT/SLP Co-Evaluation/Treatment: Yes Reason for Co-Treatment: To address functional/ADL transfers PT goals addressed during session: Mobility/safety with mobility OT goals addressed during session: ADL's and self-care       AM-PAC PT "6 Clicks" Mobility  Outcome Measure Help needed turning from your back to your side while in a flat bed without using bedrails?: A Little Help needed moving from lying on your back to sitting on the side of a flat bed without using bedrails?: A Little Help needed moving to and from a bed to a chair (including a wheelchair)?: None Help needed standing up from a chair using your arms (e.g., wheelchair or bedside chair)?: None Help needed to walk in hospital room?: None Help needed climbing 3-5 steps with a railing? : None 6 Click Score: 22    End of Session   Activity Tolerance: Patient tolerated treatment well Patient left: in chair;with call bell/phone within reach Nurse Communication: Mobility status PT Visit Diagnosis: Unsteadiness on feet (R26.81);Other abnormalities of gait and mobility (R26.89);Muscle weakness (generalized) (M62.81)    Time: 5465-6812 PT Time Calculation (min) (ACUTE ONLY): 20 min   Charges:   PT Evaluation $PT Eval Moderate Complexity: 1  Mod PT Treatments $Therapeutic Activity: 8-22 mins       Cassie Jones, SPT  During this treatment session, the therapist was present, participating in and directing the treatment.  11:15 AM, 02/23/21 Lonell Grandchild, MPT Physical Therapist with [facility] 336 [phone] office [phone] mobile phone

## 2021-02-23 NOTE — Discharge Summary (Signed)
  Patient ID: Chika Cichowski MRN: [id number] DOB/AGE: 11/22/1950 70 y.o.  Admit date: 02/22/2021 Discharge date: 02/23/2021  Admission Diagnoses: Left shoulder pain, failed left rotator cuff repair  Discharge Diagnoses:  Principal Problem:   History of failed repair of rotator cuff   Past Medical History:  Diagnosis Date   Arthritis    lumbar region, spondylolisthesis    Hypercholesteremia      Procedures Performed: Left reverse shoulder arthroplasty  Discharged Condition: good  Hospital Course: Patient brought in as an outpatient for surgery.  Tolerated procedure well.  Was kept for monitoring overnight for pain control and medical monitoring postop and was found to be stable for DC home the morning after surgery.   She was evaluated by PT and OT prior to DC. Patient was instructed on specific activity restrictions and all questions were answered.  All patients needs were addressed prior to DC   Consults: None  Significant Diagnostic Studies: No additional pertinent studies  Treatments: Surgery  Discharge Exam:  Vitals with BMI 02/23/2021 02/22/2021 02/22/2021  Height - - -  Weight - - -  BMI - - -  Systolic 116 125 212  Diastolic 81 81 82  Pulse 79 82 90   CBC Latest Ref Rng & Units 02/23/2021 02/20/2021 09/11/2019  WBC 4.0 - 10.5 K/uL 16.9(H) 7.1 8.0  Hemoglobin 12.0 - 15.0 g/dL 10.7(L) 12.4 11.9(L)  Hematocrit 36.0 - 46.0 % 33.9(L) 39.5 37.3  Platelets 150 - 400 K/uL 270 297 341      Alert and oriented.  No acute distress  Left shoulder dressing intact.  Small amount of drainage Sensation intact throughout the left arm.  Little bit of tingling in her left hand. Active motion intact in the AIN/PIN/U nerve distribution Fingers are warm and well perfused Sling is a little loose.  Disposition: Discharge disposition: 01-Home or Self Care       Allergies as of 02/23/2021       Reactions   Aspirin Other (See Comments)   "Messes stomach up  real bad"        Medication List     TAKE these medications    acetaminophen 500 MG tablet Commonly known as: TYLENOL Take 2 tablets (1,000 mg total) by mouth every 8 (eight) hours for 14 days.   celecoxib 100 MG capsule Commonly known as: CeleBREX Take 1 capsule (100 mg total) by mouth daily for 14 days.   Fish Oil 1000 MG Caps Take 1,000 mg by mouth daily.   multivitamin tablet Take 2 tablets by mouth daily. Gummies   ondansetron 4 MG tablet Commonly known as: Zofran Take 1 tablet (4 mg total) by mouth every 8 (eight) hours as needed for up to 14 days for nausea or vomiting.   oxyCODONE 5 MG immediate release tablet Commonly known as: Roxicodone Take 1 tablet (5 mg total) by mouth every 4 (four) hours as needed for up to 7 days.   vitamin B-12 1000 MCG tablet Commonly known as: CYANOCOBALAMIN Take 1,000 mcg by mouth daily.        Follow-up Information     Oliver Barre, MD Follow up.   Specialties: Orthopedic Surgery, Sports Medicine Why: 10-14 days for suture removal/incision check Contact information: [address] [place] [address] [phone]

## 2021-02-23 NOTE — Discharge Instructions (Signed)
  Kathryn Woods A. Dallas Schimke, MD MS [facility] [address] [place],  [place]  [address] Phone: [phone] Fax: [phone]    POST-OPERATIVE INSTRUCTIONS - TOTAL SHOULDER REPLACEMENT    WOUND CARE You may leave the operative dressing in place until your follow-up appointment. KEEP THE INCISIONS CLEAN AND DRY. There may be a small amount of fluid/bleeding leaking at the surgical site. This is normal after surgery.  If it fills with liquid or blood please call us immediately to change it for you. Use the provided ice machine or Ice packs as often as possible for the first 3-4 days, then as needed for pain relief.  Keep a layer of cloth or a shirt between your skin and the cooling unit to prevent frost bite as it can get very cold.  SHOWERING: - You may shower on Post-Op Day #3.  - The dressing is water resistant but do not scrub it as it may start to peel up.   - You may remove the sling for showering, but keep a water resistant pillow under the arm to keep both the  elbow and shoulder away from the body (mimicking the abduction sling).  - Gently pat the area dry.  - Do not soak the shoulder in water. Do not go swimming in the pool or ocean until your sutures are removed. - KEEP THE INCISIONS CLEAN AND DRY.  EXERCISES Wear the sling at all times except when doing your exercises. You may remove the sling for showering, but keep the arm across the chest or in a secondary sling.    Accidental/Purposeful External Rotation and shoulder flexion (reaching behind you) is to be avoided at all costs for the first month. It is ok to come out of your sling if your are sitting and have assistance for eating.  Do not lift anything heavier than 1 pound until we discuss it further in clinic. Please perform the exercises:   Elbow / Hand / Wrist  Range of Motion Exercises Grip strengthening   REGIONAL ANESTHESIA (NERVE BLOCKS) The anesthesia team may have performed a nerve block  for you if safe in the setting of your care.  This is a great tool used to minimize pain.  Typically the block may start wearing off overnight but the long acting medicine may last for 3-4 days.  The nerve block wearing off can be a challenging period but please utilize your as needed pain medications to try and manage this period.    POST-OP MEDICATIONS- Multimodal approach to pain control In general your pain will be controlled with a combination of substances.  Prescriptions unless otherwise discussed are electronically sent to your pharmacy.  This is a carefully made plan we use to minimize narcotic use.     Celebrex - Anti-inflammatory medication taken on a scheduled basis Acetaminophen - Non-narcotic pain medicine taken on a scheduled basis  Oxycodone - This is a strong narcotic, to be used only on an "as needed" basis for pain. Zofran -  take as needed for nausea  Meloxicam/Celebrex - these are anti-inflammatory and pain relievers.  Do not take additional ibuprofen, naproxen or other NSAID while taking this medicine.   FOLLOW-UP If you develop a Fever (>101.5), Redness or Drainage from the surgical incision site, please call our office to arrange for an evaluation. Please call the office to schedule a follow-up appointment for a wound check, 7-10 days post-operatively.

## 2021-02-23 NOTE — Progress Notes (Signed)
Nsg Discharge Note  Admit Date:  02/22/2021 Discharge date: 02/23/2021   Zack Seal Crapps to be D/C'd Home per MD order.  AVS completed.  Patient/caregiver able to verbalize understanding.  Discharge Medication: Allergies as of 02/23/2021       Reactions   Aspirin Other (See Comments)   "Messes stomach up real bad"        Medication List     TAKE these medications    acetaminophen 500 MG tablet Commonly known as: TYLENOL Take 2 tablets (1,000 mg total) by mouth every 8 (eight) hours for 14 days.   celecoxib 100 MG capsule Commonly known as: CeleBREX Take 1 capsule (100 mg total) by mouth daily for 14 days.   Fish Oil 1000 MG Caps Take 1,000 mg by mouth daily.   multivitamin tablet Take 2 tablets by mouth daily. Gummies   ondansetron 4 MG tablet Commonly known as: Zofran Take 1 tablet (4 mg total) by mouth every 8 (eight) hours as needed for up to 14 days for nausea or vomiting.   oxyCODONE 5 MG immediate release tablet Commonly known as: Roxicodone Take 1 tablet (5 mg total) by mouth every 4 (four) hours as needed for up to 7 days.   vitamin B-12 1000 MCG tablet Commonly known as: CYANOCOBALAMIN Take 1,000 mcg by mouth daily.        Discharge Assessment: Vitals:   02/22/21 1936 02/23/21 0439  BP: 125/81 116/81  Pulse: 82 79  Resp: 20 20  Temp: 98.2 F (36.8 C) 98.2 F (36.8 C)  SpO2: 94% 96%   Skin clean, dry and intact without evidence of skin break down, no evidence of skin tears noted. IV catheter discontinued intact. Site without signs and symptoms of complications - no redness or edema noted at insertion site, patient denies c/o pain - only slight tenderness at site.  Dressing with slight pressure applied.  D/c Instructions-Education: Discharge instructions given to patient/family with verbalized understanding. D/c education completed with patient/family including follow up instructions, medication list, d/c activities limitations if  indicated, with other d/c instructions as indicated by MD - patient able to verbalize understanding, all questions fully answered. Patient instructed to return to ED, call 911, or call MD for any changes in condition.  Patient escorted via WC, and D/C home via private auto.  Verl Dicker, RN 02/23/2021 10:33 AM

## 2021-02-27 ENCOUNTER — Telehealth: Payer: Self-pay | Admitting: Orthopedic Surgery

## 2021-02-27 NOTE — Telephone Encounter (Addendum)
Patient left a voice message 1:34pm today, from ph# [phone], asking for something stronger for pain; states that the Oxycodone 5 mg (immediate release tablets) given following surgery done on 02/22/21 is not doing enough. I have called back to patient to acknowledge her message and to let her know we will forward it to clinic staff for Dr Dallas Schimke to receive and advise.*  *note*the phone number given on message has no voice mail available. I also called her ph# on file, [phone], and left a general message in response.  *note also*patient is not yet scheduled for a post op visit  (Routing to nurse and copying in clinic administrator Toniann Fail)

## 2021-02-28 MED ORDER — GABAPENTIN 100 MG PO CAPS: 100.0000 mg | ORAL_CAPSULE | Freq: Three times a day (TID) | ORAL | 0 refills | Status: DC

## 2021-02-28 MED ORDER — HYDROCODONE-ACETAMINOPHEN 5-325 MG PO TABS: 1.0000 | ORAL_TABLET | ORAL | 0 refills | Status: AC | PRN

## 2021-02-28 NOTE — Telephone Encounter (Signed)
LVM stating medications have been sent and to stop taking the tylenol. Call back information given in case of questions.

## 2021-02-28 NOTE — Telephone Encounter (Signed)
Pt would like to know if she can switch to hydrocodone instead of oxycodone, states she has better results with that. Pt also states she is having tingling in her elbow going down to her fingers. Has had gabapentin in the past without any complications. Pharmacy verified to be [facility]. Please assist.

## 2021-03-07 ENCOUNTER — Ambulatory Visit (HOSPITAL_COMMUNITY): Payer: Medicare Other | Attending: Orthopedic Surgery | Admitting: Occupational Therapy

## 2021-03-07 ENCOUNTER — Ambulatory Visit: Payer: Medicare Other

## 2021-03-07 ENCOUNTER — Other Ambulatory Visit: Payer: Self-pay

## 2021-03-07 ENCOUNTER — Encounter: Payer: Self-pay | Admitting: Orthopedic Surgery

## 2021-03-07 ENCOUNTER — Ambulatory Visit (INDEPENDENT_AMBULATORY_CARE_PROVIDER_SITE_OTHER): Payer: Medicare Other | Admitting: Orthopedic Surgery

## 2021-03-07 ENCOUNTER — Encounter (HOSPITAL_COMMUNITY): Payer: Self-pay | Admitting: Occupational Therapy

## 2021-03-07 DIAGNOSIS — M25512 Pain in left shoulder: Secondary | ICD-10-CM | POA: Diagnosis not present

## 2021-03-07 DIAGNOSIS — R29898 Other symptoms and signs involving the musculoskeletal system: Secondary | ICD-10-CM | POA: Insufficient documentation

## 2021-03-07 DIAGNOSIS — Z96612 Presence of left artificial shoulder joint: Secondary | ICD-10-CM

## 2021-03-07 DIAGNOSIS — M25612 Stiffness of left shoulder, not elsewhere classified: Secondary | ICD-10-CM | POA: Diagnosis present

## 2021-03-07 MED ORDER — HYDROCODONE-ACETAMINOPHEN 5-325 MG PO TABS: 1.0000 | ORAL_TABLET | Freq: Four times a day (QID) | ORAL | 0 refills | Status: DC | PRN

## 2021-03-07 NOTE — Patient Instructions (Signed)
1) SHOULDER: Flexion On Table   Place hands on towel placed on table, elbows straight. Lean forward with you upper body, pushing towel away from body.  __10-15_ reps per set, __2-3_ sets per day  2) Abduction (Passive)   With arm out to side, resting on towel placed on table with palm DOWN, keeping trunk away from table, lean to the side while pushing towel away from body.  Repeat __10-15__ times. Do __2-3__ sessions per day.  Copyright  VHI. All rights reserved.     3) Internal Rotation (Assistive)   Seated with elbow bent at right angle and held against side, slide arm on table surface in an inward arc keeping elbow anchored in place. Repeat __10-15__ times. Do __2-3__ sessions per day. Activity: Use this motion to brush crumbs off the table.  Copyright  VHI. All rights reserved.   

## 2021-03-07 NOTE — Patient Instructions (Signed)
Ok to stop using pillow  Continue with therapy

## 2021-03-07 NOTE — Progress Notes (Signed)
Orthopaedic Postop Note  Assessment: Kathryn Woods is a 70 y.o. female s/p Left Reverse Shoulder Arthroplasty, in setting of failed rotator cuff repair  DOS: 02/22/2021  Plan: Sutures were trimmed, steri strips were placed Ok to remove the abduction pillow; can stop using the sling around 4 weeks postop Physical therapy prescription and protocol provided Anticipated progression discussed, XR reviewed in clinic  Patient continues to have some numbness and tingling in the left arm.  She does have decreased sensation within the axillary nerve distribution.  Unable to definitively say the deltoid is firing due to pain and limited range of motion.  She also has some numbness in her forearm, as well as her median nerve distribution.  Active motion in the AIN is intact.  She is able to flex and extend the elbow. Follow up 2 weeks   Follow-up: Return in about 2 weeks (around 03/21/2021).  XR at next visit: Left shoulder  Subjective:  Chief Complaint  Patient presents with   Routine Post Op    DOS 02/22/21 S/p LT RSA    History of Present Illness: Kathryn Woods is a 70 y.o. female who presents following the above stated procedure.  Overall, she is doing well.  Her pain is controlled with hydrocodone.  She does have some numbness and tingling in the left arm.  She has maintained her arm in a sling.  She is scheduled to start her first physical therapy appointment  Review of Systems: No fevers or chills No numbness or tingling No Chest Pain No shortness of breath   Objective: There were no vitals taken for this visit.  Physical Exam:  Alert and oriented.  No acute distress.  Surgical incision is healing well.  No surrounding erythema or drainage.  Decree sensation in the axillary nerve distribution.  Numbness and tingling in the forearm, as well as the median nerve distribution.  She is able to actively extend her thumb.  She is able to actively flex the  DIP to the's index finger.  She is able to cross her fingers.  Sensation is intact in the ulnar nerve distribution.  Fingers are warm and well-perfused.  She tolerated passive forward flexion to 90 degrees.  Unable to definitively say that the deltoid is firing due to pain and stiffness in her shoulder.  IMAGING: I personally ordered and reviewed the following images:  XR of the Left shoulder were obtained in clinic today and demonstrates a reverse shoulder arthroplasty with implants in good position.  No evidence of acute injury or subsidence of implants.   Impression: Left shoulder arthroplasty in good position   Oliver Barre, MD 03/07/2021 10:18 AM

## 2021-03-08 NOTE — Therapy (Signed)
[facility] [address] [place], [place], [address] Phone: [phone]   Fax:  [phone]  Occupational Therapy Evaluation  Patient Details  Name: Kathryn Woods MRN: [id number] Date of Birth: [birth year] Referring Provider (OT): Dr. Thane Edu   Encounter Date: 03/07/2021   OT End of Session - 03/08/21 0711     Visit Number 1    Number of Visits 16    Date for OT Re-Evaluation 05/07/21   mini-reassessment 04/05/21   Authorization Type [employer] Medicare, $30 copay    Authorization Time Period no visit limit    Progress Note Due on Visit 10    OT Start Time 1346    OT Stop Time 1419    OT Time Calculation (min) 33 min    Activity Tolerance Patient tolerated treatment well    Behavior During Therapy [facility] for tasks assessed/performed             Past Medical History:  Diagnosis Date   Arthritis    lumbar region, spondylolisthesis    Hypercholesteremia     Past Surgical History:  Procedure Laterality Date   BACK SURGERY     KNEE ARTHROSCOPY WITH LATERAL MENISECTOMY Right 02/25/2019   Procedure: RIGHT KNEE ARTHROSCOPY WITH LATERAL MENISECTOMY;  Surgeon: Vickki Hearing, MD;  Location: [facility];  Service: Orthopedics;  Laterality: Right;   REVERSE SHOULDER ARTHROPLASTY Left 02/22/2021   Procedure: LEFT REVERSE SHOULDER ARTHROPLASTY;  Surgeon: Oliver Barre, MD;  Location: [facility];  Service: Orthopedics;  Laterality: Left;   SHOULDER ARTHROSCOPY WITH OPEN ROTATOR CUFF REPAIR Right 10/15/2018   Procedure: SHOULDER ARTHROSCOPY WITH OPEN ROTATOR CUFF REPAIR;  Surgeon: Vickki Hearing, MD;  Location: [facility];  Service: Orthopedics;  Laterality: Right;   SHOULDER ARTHROSCOPY WITH ROTATOR CUFF REPAIR Left 08/19/2019   Procedure: LEFT SHOULDER ARTHROSCOPY WITH ROTATOR CUFF REPAIR;  Surgeon: Vickki Hearing, MD;  Location: [facility];  Service: Orthopedics;  Laterality: Left;  With scalene block    TUBAL LIGATION      There  were no vitals filed for this visit.   Subjective Assessment - 03/07/21 1348     Subjective  S: I accidently tried to use my shoulder to open the car door.    Pertinent History Pt is a 70 y/o female s/p left reverse TSA on 02/22/21. Pt underwent RCR in 2021 however repair failed therefore pt proceeded with TSA. Pt saw MD today and he removed the abduction sling with instructions to continue sling until 4 weeks post-op. Pt was referred to occupational therapy for evaluation and treatment by Dr. Thane Edu.    Special Tests FOTO: 28/100    Patient Stated Goals To be able to use my arm.    Currently in Pain? Yes    Pain Score 8     Pain Location Shoulder    Pain Orientation Left    Pain Descriptors / Indicators Aching;Sore;Tingling;Numbness    Pain Type Acute pain    Pain Radiating Towards N/A    Pain Onset Today    Pain Frequency Intermittent    Aggravating Factors  shutting car door, moving arm    Pain Relieving Factors rest, pain medication    Effect of Pain on Daily Activities unable to use LUE for ADLs    Multiple Pain Sites No               [facility] OT Assessment - 03/07/21 1345       Assessment  Medical Diagnosis s/p left reverse TSA    Referring Provider (OT) Dr. Thane Edu    Onset Date/Surgical Date 02/22/21    Hand Dominance Right    Next MD Visit 03/21/2021    Prior Therapy None      Precautions   Precautions Shoulder    Type of Shoulder Precautions see protocol; wean from sling at 4 weeks (03/22/21)    Shoulder Interventions Shoulder sling/immobilizer;Off for dressing/bathing/exercises      Balance Screen   Has the patient fallen in the past 6 months No      Prior Function   Level of Independence Independent    Vocation Retired    Leisure unsure      ADL   ADL comments Pt with max difficulty using LUE for ADLs. Pt is sleeping on the couch for now. Pt is unable to reach up or behind back, lift items.      Written Expression   Dominant Hand Right       Cognition   Overall Cognitive Status Within Functional Limits for tasks assessed      Observation/Other Assessments   Focus on Therapeutic Outcomes (FOTO)  28/100      ROM / Strength   AROM / PROM / Strength PROM;Strength;AROM      Palpation   Palpation comment moderate fascial restrictions along left upper arm and anterior shoulder, upper trapezius, and scapular regions      AROM   Overall AROM  Deficits;Unable to assess;Due to precautions;Due to pain    AROM Assessment Site Shoulder    Right/Left Shoulder Left      PROM   Overall PROM Comments Assessed seated, er/IR adducted    PROM Assessment Site Shoulder    Right/Left Shoulder Left    Left Shoulder Flexion 95 Degrees    Left Shoulder ABduction 85 Degrees    Left Shoulder Internal Rotation 20 Degrees      Strength   Overall Strength Deficits;Unable to assess;Due to precautions;Due to pain    Strength Assessment Site Shoulder    Right/Left Shoulder Left                              OT Education - 03/07/21 1408     Education Details table slides    Person(s) Educated Patient    Methods Explanation;Demonstration;Handout    Comprehension Verbalized understanding;Returned demonstration              OT Short Term Goals - 03/08/21 0715       OT SHORT TERM GOAL #1   Title Pt will be provided with and educated on HEP to improve ability to use LUE as non-dominant during ADLs.    Time 4    Period Weeks    Status New    Target Date 04/07/21      OT SHORT TERM GOAL #2   Title Pt will increase LUE P/ROM to [facility] to improve ability to perform dressing tasks with minimal compensatory strategies.    Time 4    Period Weeks    Status New      OT SHORT TERM GOAL #3   Title Pt will increase LUE strength to 3/5 to improve ability to reach items at waist to chest height during ADL completion.    Time 4    Period Weeks    Status New               OT  Long Term Goals - 03/08/21 0715       OT  LONG TERM GOAL #1   Title Patient will return to highest level of independence while using her LUE for all daily and household tasks.    Time 8    Period Weeks    Status New    Target Date 05/07/21      OT LONG TERM GOAL #2   Title Patient will increase LUE A/ROM to [facility] in order to reach into overhead cabinets with less difficulty.    Time 8    Period Weeks    Status New      OT LONG TERM GOAL #3   Title Patient will decrease fascial restrictions in LUE to min amount or less in order to increase functional mobilty needed to complete reaching and dressing tasks.    Time 8    Period Weeks    Status New      OT LONG TERM GOAL #4   Title Patient will increase her LUE strength to 4+/5 in order to return to completing her required yardwork tasks.    Time 8    Period Weeks    Status New      OT LONG TERM GOAL #5   Title Patient will report a decrease in pain of approximately 3/10 or less when completing daily and household chores using her LUE.    Time 8    Period Weeks    Status New                   Plan - 03/08/21 8088     Clinical Impression Statement A: Pt is a 70 y/o female s/p left reverse TSA on 02/22/21, presenting with decreased functional use of LUE limiting ability to complete ADLs and leisure tasks. Pt with sling, abduction pillow has been removed per MD.    OT Occupational Profile and History Problem Focused Assessment - Including review of records relating to presenting problem    Occupational performance deficits (Please refer to evaluation for details): ADL's;IADL's;Rest and Sleep;Leisure    Body Structure / Function / Physical Skills ADL;Endurance;Muscle spasms;UE functional use;Fascial restriction;Pain;ROM;IADL;Strength    Rehab Potential Good    Clinical Decision Making Limited treatment options, no task modification necessary    Comorbidities Affecting Occupational Performance: None    Modification or Assistance to Complete Evaluation  No modification  of tasks or assist necessary to complete eval    OT Frequency 2x / week    OT Duration 8 weeks    OT Treatment/Interventions Self-care/ADL training;Ultrasound;Patient/family education;Passive range of motion;Cryotherapy;Electrical Stimulation;Moist Heat;Therapeutic exercise;Manual Therapy;Therapeutic activities    Plan P: Pt will benefit from skilled OT services to decrease pain and fascial restrictions, increase joint ROM, strength, and functional use of LUE as non-dominant. Treatment plan: myofascial release and manual techniques, P/ROM, AA/ROM, A/ROM, general LUE strengthening, scapular mobility/stability/strengthening, modalities prn    OT Home Exercise Plan eval: table slides    Consulted and Agree with Plan of Care Patient             Patient will benefit from skilled therapeutic intervention in order to improve the following deficits and impairments:   Body Structure / Function / Physical Skills: ADL, Endurance, Muscle spasms, UE functional use, Fascial restriction, Pain, ROM, IADL, Strength       Visit Diagnosis: Acute pain of left shoulder  Other symptoms and signs involving the musculoskeletal system  Stiffness of left shoulder, not elsewhere classified    Problem  List Patient Active Problem List   Diagnosis Date Noted   History of failed repair of rotator cuff 02/22/2021   S/P arthroscopy of left shoulder RCR 08/19/19 08/25/2019   Nontraumatic complete tear of left rotator cuff    S/P right knee arthroscopy 02/25/2019 03/02/2019   Complex tear of lateral meniscus of right knee as current injury    Status post rotator cuff repair 10/15/2018 12/21/2018   Biceps tendon rupture, proximal, right, subsequent encounter    Primary osteoarthritis of right shoulder    Labral tear of shoulder, degenerative, right    Synovitis of right shoulder    Spondylolisthesis at L3-L4 level 12/10/2016   Elevated blood-pressure reading, without diagnosis of hypertension 11/06/2016    Bursitis, shoulder 02/24/2013   HYPERLIPIDEMIA 02/04/2007   WRIST SPRAIN, LEFT 02/04/2007   MIGRAINE HEADACHE 01/15/2007   BRONCHITIS, CHRONIC NOS 01/15/2007   CONSTIPATION 01/15/2007   IBS 01/15/2007   DISC DISEASE, LUMBOSACRAL SPINE 01/15/2007   LOW BACK PAIN, CHRONIC 01/15/2007   MALAISE AND FATIGUE 01/15/2007   Ezra Sites, OTR/L  [phone] 03/08/2021, 7:18 AM  [facility] [address] [place], [place], [address] Phone: [phone]   Fax:  [phone]  Name: Kathryn Woods MRN: [id number] Date of Birth: 1950/05/31

## 2021-03-09 ENCOUNTER — Other Ambulatory Visit: Payer: Self-pay

## 2021-03-09 ENCOUNTER — Encounter (HOSPITAL_COMMUNITY): Payer: Self-pay | Admitting: Occupational Therapy

## 2021-03-09 ENCOUNTER — Ambulatory Visit (HOSPITAL_COMMUNITY): Payer: Medicare Other | Attending: Orthopedic Surgery | Admitting: Occupational Therapy

## 2021-03-09 DIAGNOSIS — M25612 Stiffness of left shoulder, not elsewhere classified: Secondary | ICD-10-CM | POA: Diagnosis present

## 2021-03-09 DIAGNOSIS — M25512 Pain in left shoulder: Secondary | ICD-10-CM | POA: Diagnosis not present

## 2021-03-09 DIAGNOSIS — R29898 Other symptoms and signs involving the musculoskeletal system: Secondary | ICD-10-CM | POA: Diagnosis present

## 2021-03-09 NOTE — Therapy (Signed)
[facility] [address] [place], [place], [address] Phone: [phone]   Fax:  [phone]  Occupational Therapy Treatment  Patient Details  Name: Kathryn Woods MRN: [id number] Date of Birth: May 17, 1950 Referring Provider (OT): Dr. Thane Edu   Encounter Date: 03/09/2021   OT End of Session - 03/09/21 1515     Visit Number 2    Number of Visits 16    Date for OT Re-Evaluation 05/07/21   mini-reassessment 04/05/21   Authorization Type [employer] Medicare, $30 copay    Authorization Time Period no visit limit    Progress Note Due on Visit 10    OT Start Time 1435    OT Stop Time 1513    OT Time Calculation (min) 38 min    Activity Tolerance Patient tolerated treatment well    Behavior During Therapy [facility] for tasks assessed/performed             Past Medical History:  Diagnosis Date   Arthritis    lumbar region, spondylolisthesis    Hypercholesteremia     Past Surgical History:  Procedure Laterality Date   BACK SURGERY     KNEE ARTHROSCOPY WITH LATERAL MENISECTOMY Right 02/25/2019   Procedure: RIGHT KNEE ARTHROSCOPY WITH LATERAL MENISECTOMY;  Surgeon: Vickki Hearing, MD;  Location: [facility];  Service: Orthopedics;  Laterality: Right;   REVERSE SHOULDER ARTHROPLASTY Left 02/22/2021   Procedure: LEFT REVERSE SHOULDER ARTHROPLASTY;  Surgeon: Oliver Barre, MD;  Location: [facility];  Service: Orthopedics;  Laterality: Left;   SHOULDER ARTHROSCOPY WITH OPEN ROTATOR CUFF REPAIR Right 10/15/2018   Procedure: SHOULDER ARTHROSCOPY WITH OPEN ROTATOR CUFF REPAIR;  Surgeon: Vickki Hearing, MD;  Location: [facility];  Service: Orthopedics;  Laterality: Right;   SHOULDER ARTHROSCOPY WITH ROTATOR CUFF REPAIR Left 08/19/2019   Procedure: LEFT SHOULDER ARTHROSCOPY WITH ROTATOR CUFF REPAIR;  Surgeon: Vickki Hearing, MD;  Location: [facility];  Service: Orthopedics;  Laterality: Left;  With scalene block    TUBAL LIGATION      There  were no vitals filed for this visit.   Subjective Assessment - 03/09/21 1438     Subjective  S: It's been sore since I left here.    Currently in Pain? Yes    Pain Score 8     Pain Location Shoulder    Pain Orientation Left    Pain Descriptors / Indicators Aching;Sore    Pain Radiating Towards N/A    Pain Onset In the past 7 days    Pain Frequency Intermittent    Aggravating Factors  exercises    Pain Relieving Factors rest, pain medication    Effect of Pain on Daily Activities unable to use LUE for ADLs    Multiple Pain Sites No                [facility] OT Assessment - 03/09/21 1439       Assessment   Medical Diagnosis s/p left reverse TSA      Precautions   Precautions Shoulder    Type of Shoulder Precautions see protocol; wean from sling at 4 weeks (03/22/21)    Shoulder Interventions Shoulder sling/immobilizer;Off for dressing/bathing/exercises                      OT Treatments/Exercises (OP) - 03/09/21 1439       Exercises   Exercises Shoulder      Shoulder Exercises: Supine   Protraction PROM;10 reps  Horizontal ABduction PROM;10 reps    External Rotation PROM;10 reps    Internal Rotation PROM;10 reps    Flexion PROM;10 reps    ABduction PROM;10 reps      Shoulder Exercises: Seated   Elevation AROM;10 reps    Retraction AROM;10 reps    Row AROM;10 reps    Other Seated Exercises scapular depression, A/ROM 10X      Shoulder Exercises: Isometric Strengthening   Flexion Supine;3X3"    Extension Supine;3X3"    External Rotation Supine;3X3"    Internal Rotation Supine;3X3"    ABduction Supine;3X3"    ADduction Supine;3X3"      Manual Therapy   Manual Therapy Myofascial release    Manual therapy comments completed separately from therapeutic exercise    Myofascial Release myofascial release and manual techniques to left upper arm, anterior shoulder, trapezius, and scapular regions to decrease pain and fascial restrictions and increase  joint ROM.                      OT Short Term Goals - 03/09/21 1500       OT SHORT TERM GOAL #1   Title Pt will be provided with and educated on HEP to improve ability to use LUE as non-dominant during ADLs.    Time 4    Period Weeks    Status On-going    Target Date 04/07/21      OT SHORT TERM GOAL #2   Title Pt will increase LUE P/ROM to [facility] to improve ability to perform dressing tasks with minimal compensatory strategies.    Time 4    Period Weeks    Status On-going      OT SHORT TERM GOAL #3   Title Pt will increase LUE strength to 3/5 to improve ability to reach items at waist to chest height during ADL completion.    Time 4    Period Weeks    Status On-going               OT Long Term Goals - 03/09/21 1500       OT LONG TERM GOAL #1   Title Patient will return to highest level of independence while using her LUE for all daily and household tasks.    Time 8    Period Weeks    Status On-going      OT LONG TERM GOAL #2   Title Patient will increase LUE A/ROM to [facility] in order to reach into overhead cabinets with less difficulty.    Time 8    Period Weeks    Status On-going      OT LONG TERM GOAL #3   Title Patient will decrease fascial restrictions in LUE to min amount or less in order to increase functional mobilty needed to complete reaching and dressing tasks.    Time 8    Period Weeks    Status On-going      OT LONG TERM GOAL #4   Title Patient will increase her LUE strength to 4+/5 in order to return to completing her required yardwork tasks.    Time 8    Period Weeks    Status On-going      OT LONG TERM GOAL #5   Title Patient will report a decrease in pain of approximately 3/10 or less when completing daily and household chores using her LUE.    Time 8    Period Weeks    Status On-going  Plan - 03/09/21 1500     Clinical Impression Statement A: Initiated myofascial release to address fascial  restrictions today. Pt continues to have decreased sensation along upper arm and tricep regions. Completed passive stretching, pt demonstrating less guarding today compared to evaluation. Completed isometrics, scapular A/ROM, and table slides. Educated on proper form with table slides and pt able to complete without difficulty.    Body Structure / Function / Physical Skills ADL;Endurance;Muscle spasms;UE functional use;Fascial restriction;Pain;ROM;IADL;Strength    Plan P: Continue with protocol, work to increase ROM    OT Home Exercise Plan eval: table slides    Consulted and Agree with Plan of Care Patient             Patient will benefit from skilled therapeutic intervention in order to improve the following deficits and impairments:   Body Structure / Function / Physical Skills: ADL, Endurance, Muscle spasms, UE functional use, Fascial restriction, Pain, ROM, IADL, Strength       Visit Diagnosis: Acute pain of left shoulder  Other symptoms and signs involving the musculoskeletal system  Stiffness of left shoulder, not elsewhere classified    Problem List Patient Active Problem List   Diagnosis Date Noted   History of failed repair of rotator cuff 02/22/2021   S/P arthroscopy of left shoulder RCR 08/19/19 08/25/2019   Nontraumatic complete tear of left rotator cuff    S/P right knee arthroscopy 02/25/2019 03/02/2019   Complex tear of lateral meniscus of right knee as current injury    Status post rotator cuff repair 10/15/2018 12/21/2018   Biceps tendon rupture, proximal, right, subsequent encounter    Primary osteoarthritis of right shoulder    Labral tear of shoulder, degenerative, right    Synovitis of right shoulder    Spondylolisthesis at L3-L4 level 12/10/2016   Elevated blood-pressure reading, without diagnosis of hypertension 11/06/2016   Bursitis, shoulder 02/24/2013   HYPERLIPIDEMIA 02/04/2007   WRIST SPRAIN, LEFT 02/04/2007   MIGRAINE HEADACHE 01/15/2007    BRONCHITIS, CHRONIC NOS 01/15/2007   CONSTIPATION 01/15/2007   IBS 01/15/2007   DISC DISEASE, LUMBOSACRAL SPINE 01/15/2007   LOW BACK PAIN, CHRONIC 01/15/2007   MALAISE AND FATIGUE 01/15/2007    Ezra Sites, OTR/L  [phone] 03/09/2021, 3:15 PM  [place] [facility] [address] [place], [place], [address] Phone: [phone]   Fax:  [phone]  Name: Willistine Ferrall MRN: [id number] Date of Birth: 1950/12/10

## 2021-03-16 ENCOUNTER — Encounter (HOSPITAL_COMMUNITY): Payer: Medicare Other | Admitting: Occupational Therapy

## 2021-03-20 ENCOUNTER — Other Ambulatory Visit: Payer: Self-pay

## 2021-03-20 ENCOUNTER — Ambulatory Visit (HOSPITAL_COMMUNITY): Payer: Medicare Other

## 2021-03-20 ENCOUNTER — Encounter (HOSPITAL_COMMUNITY): Payer: Self-pay

## 2021-03-20 DIAGNOSIS — M25612 Stiffness of left shoulder, not elsewhere classified: Secondary | ICD-10-CM

## 2021-03-20 DIAGNOSIS — M25512 Pain in left shoulder: Secondary | ICD-10-CM | POA: Diagnosis not present

## 2021-03-20 DIAGNOSIS — R29898 Other symptoms and signs involving the musculoskeletal system: Secondary | ICD-10-CM

## 2021-03-20 NOTE — Therapy (Signed)
[facility] [address] [place], [place], [address] Phone: [phone]   Fax:  [phone]  Occupational Therapy Treatment  Patient Details  Name: Kathryn Woods MRN: [id number] Date of Birth: 1951-04-07 Referring Provider (OT): Dr. Larena Glassman   Encounter Date: 03/20/2021   OT End of Session - 03/20/21 1503     Visit Number 3    Number of Visits 16    Date for OT Re-Evaluation 05/07/21   mini-reassessment 04/05/21   Authorization Type [employer] Medicare, $30 copay    Authorization Time Period no visit limit    Progress Note Due on Visit 10    OT Start Time 1430    OT Stop Time 1508    OT Time Calculation (min) 38 min    Activity Tolerance Patient tolerated treatment well    Behavior During Therapy [facility] for tasks assessed/performed             Past Medical History:  Diagnosis Date   Arthritis    lumbar region, spondylolisthesis    Hypercholesteremia     Past Surgical History:  Procedure Laterality Date   BACK SURGERY     KNEE ARTHROSCOPY WITH LATERAL MENISECTOMY Right 02/25/2019   Procedure: RIGHT KNEE ARTHROSCOPY WITH LATERAL MENISECTOMY;  Surgeon: Carole Civil, MD;  Location: [facility];  Service: Orthopedics;  Laterality: Right;   REVERSE SHOULDER ARTHROPLASTY Left 02/22/2021   Procedure: LEFT REVERSE SHOULDER ARTHROPLASTY;  Surgeon: Mordecai Rasmussen, MD;  Location: [facility];  Service: Orthopedics;  Laterality: Left;   SHOULDER ARTHROSCOPY WITH OPEN ROTATOR CUFF REPAIR Right 10/15/2018   Procedure: SHOULDER ARTHROSCOPY WITH OPEN ROTATOR CUFF REPAIR;  Surgeon: Carole Civil, MD;  Location: [facility];  Service: Orthopedics;  Laterality: Right;   SHOULDER ARTHROSCOPY WITH ROTATOR CUFF REPAIR Left 08/19/2019   Procedure: LEFT SHOULDER ARTHROSCOPY WITH ROTATOR CUFF REPAIR;  Surgeon: Carole Civil, MD;  Location: [facility];  Service: Orthopedics;  Laterality: Left;  With scalene block    TUBAL LIGATION      There  were no vitals filed for this visit.   Subjective Assessment - 03/20/21 1436     Subjective  S: I did a lot yesterday to get ready for Christmas. I hope I didn't do too much. I made sure I didn't use this arm.    Currently in Pain? Yes    Pain Score 4     Pain Location Shoulder    Pain Orientation Left    Pain Descriptors / Indicators Aching;Sore    Pain Type Acute pain    Pain Onset In the past 7 days    Pain Frequency Constant    Aggravating Factors  exercises, over use    Pain Relieving Factors rest, pain medication, ice, heat                [facility] OT Assessment - 03/20/21 1450       Assessment   Medical Diagnosis s/p left reverse TSA      Precautions   Precautions Shoulder    Type of Shoulder Precautions see protocol; wean from sling at 4 weeks (03/22/21)    Shoulder Interventions Shoulder sling/immobilizer;Off for dressing/bathing/exercises                      OT Treatments/Exercises (OP) - 03/20/21 1450       Exercises   Exercises Shoulder      Shoulder Exercises: Supine   Protraction PROM;10 reps  Horizontal ABduction PROM;10 reps    External Rotation PROM;10 reps    Internal Rotation PROM;10 reps    Flexion PROM;10 reps    ABduction PROM;10 reps      Shoulder Exercises: Seated   Retraction AROM;10 reps    Row AROM;10 reps    Other Seated Exercises scapular depression, A/ROM 10X      Shoulder Exercises: Therapy Ball   Flexion 10 reps   2" hold at end stretch   ABduction 10 reps   2" hold at end stretch     Shoulder Exercises: ROM/Strengthening   Thumb Tacks 1' low level    Prot/Ret//Elev/Dep 1'      Shoulder Exercises: Isometric Strengthening   Flexion Supine;3X3"    Extension Supine;3X3"    External Rotation Supine;3X3"    Internal Rotation Supine;3X3"    ABduction Supine;3X3"    ADduction Supine;3X3"      Manual Therapy   Manual Therapy Myofascial release    Manual therapy comments completed separately from therapeutic  exercise    Myofascial Release myofascial release and manual techniques to left upper arm, anterior shoulder, trapezius, and scapular regions to decrease pain and fascial restrictions and increase joint ROM.                      OT Short Term Goals - 03/09/21 1500       OT SHORT TERM GOAL #1   Title Pt will be provided with and educated on HEP to improve ability to use LUE as non-dominant during ADLs.    Time 4    Period Weeks    Status On-going    Target Date 04/07/21      OT SHORT TERM GOAL #2   Title Pt will increase LUE P/ROM to [facility] to improve ability to perform dressing tasks with minimal compensatory strategies.    Time 4    Period Weeks    Status On-going      OT SHORT TERM GOAL #3   Title Pt will increase LUE strength to 3/5 to improve ability to reach items at waist to chest height during ADL completion.    Time 4    Period Weeks    Status On-going               OT Long Term Goals - 03/09/21 1500       OT LONG TERM GOAL #1   Title Patient will return to highest level of independence while using her LUE for all daily and household tasks.    Time 8    Period Weeks    Status On-going      OT LONG TERM GOAL #2   Title Patient will increase LUE A/ROM to [facility] in order to reach into overhead cabinets with less difficulty.    Time 8    Period Weeks    Status On-going      OT LONG TERM GOAL #3   Title Patient will decrease fascial restrictions in LUE to min amount or less in order to increase functional mobilty needed to complete reaching and dressing tasks.    Time 8    Period Weeks    Status On-going      OT LONG TERM GOAL #4   Title Patient will increase her LUE strength to 4+/5 in order to return to completing her required yardwork tasks.    Time 8    Period Weeks    Status On-going  OT LONG TERM GOAL #5   Title Patient will report a decrease in pain of approximately 3/10 or less when completing daily and household chores using her LUE.     Time 8    Period Weeks    Status On-going                   Plan - 03/20/21 1503     Clinical Impression Statement A: 3 weeks post op. reviewed protocol and discussed ok to begin weaning from sling at 4 weeks. Myofascial release completed to address moderate fascial restrictions along the left upper arm and upper trapezius and scapularis region. Pt reports having numbness along the incision area. Focused on increasing ROM within patient's tolerance level. Added therapy ball stretches and thumb tacks. VC for form and technique were provided.    Body Structure / Function / Physical Skills ADL;Endurance;Muscle spasms;UE functional use;Fascial restriction;Pain;ROM;IADL;Strength    Plan P: Follow up on MD appointment. Continue to follow protocol. Work on increasing ROM within patient's pain tolerance.    Consulted and Agree with Plan of Care Patient             Patient will benefit from skilled therapeutic intervention in order to improve the following deficits and impairments:   Body Structure / Function / Physical Skills: ADL, Endurance, Muscle spasms, UE functional use, Fascial restriction, Pain, ROM, IADL, Strength       Visit Diagnosis: Acute pain of left shoulder  Stiffness of left shoulder, not elsewhere classified  Other symptoms and signs involving the musculoskeletal system    Problem List Patient Active Problem List   Diagnosis Date Noted   History of failed repair of rotator cuff 02/22/2021   S/P arthroscopy of left shoulder RCR 08/19/19 08/25/2019   Nontraumatic complete tear of left rotator cuff    S/P right knee arthroscopy 02/25/2019 03/02/2019   Complex tear of lateral meniscus of right knee as current injury    Status post rotator cuff repair 10/15/2018 12/21/2018   Biceps tendon rupture, proximal, right, subsequent encounter    Primary osteoarthritis of right shoulder    Labral tear of shoulder, degenerative, right    Synovitis of right shoulder     Spondylolisthesis at L3-L4 level 12/10/2016   Elevated blood-pressure reading, without diagnosis of hypertension 11/06/2016   Bursitis, shoulder 02/24/2013   HYPERLIPIDEMIA 02/04/2007   WRIST SPRAIN, LEFT 02/04/2007   MIGRAINE HEADACHE 01/15/2007   BRONCHITIS, CHRONIC NOS 01/15/2007   CONSTIPATION 01/15/2007   IBS 01/15/2007   DISC DISEASE, LUMBOSACRAL SPINE 01/15/2007   LOW BACK PAIN, CHRONIC 01/15/2007   MALAISE AND FATIGUE 01/15/2007   Ailene Ravel, OTR/L,CBIS  [phone]  03/20/2021, 3:16 PM  [place], [place], [address] Phone: [phone]   Fax:  [phone]  Name: Kathryn Woods MRN: [id number] Date of Birth: Sep 19, 1950

## 2021-03-21 ENCOUNTER — Encounter: Payer: Self-pay | Admitting: Orthopedic Surgery

## 2021-03-21 ENCOUNTER — Ambulatory Visit (INDEPENDENT_AMBULATORY_CARE_PROVIDER_SITE_OTHER): Payer: Medicare Other | Admitting: Orthopedic Surgery

## 2021-03-21 DIAGNOSIS — Z96612 Presence of left artificial shoulder joint: Secondary | ICD-10-CM

## 2021-03-21 MED ORDER — HYDROCODONE-ACETAMINOPHEN 5-325 MG PO TABS: 1.0000 | ORAL_TABLET | Freq: Four times a day (QID) | ORAL | 0 refills | Status: DC | PRN

## 2021-03-21 NOTE — Patient Instructions (Signed)
OK to stop using the sling  Continue with PT  One more prescription of pain medicine

## 2021-03-21 NOTE — Progress Notes (Signed)
Orthopaedic Postop Note  Assessment: Kathryn Woods is a 70 y.o. female s/p Left Reverse Shoulder Arthroplasty, in setting of failed rotator cuff repair  DOS: 02/22/2021  Plan: Left shoulder pain is improving.  She is working well with physical therapy.  She continues to have some numbness over the anterior shoulder, around the incision.  She also has some numbness into her left thumb.  Sensation has returned to the axillary nerve distribution.  She is requesting 1 more prescription for pain medication, and this will be supplied today.  Further requests will be discussed at the next visit.  It is okay for her to discontinue use of the sling at this time.  Follow-up in 2 weeks for repeat evaluation.   Follow-up: Return in about 2 weeks (around 04/04/2021).  XR at next visit: Left shoulder  Subjective:  Chief Complaint  Patient presents with   Routine Post Op    DOS 02/22/21    History of Present Illness: Kathryn Woods is a 70 y.o. female who presents following the above stated procedure.  Surgery was 4 weeks ago.  She is working well physical therapy.  Her numbness is improving.  She continues take pain medications as needed.  No additional issues at this time.   Review of Systems: No fevers or chills No numbness or tingling No Chest Pain No shortness of breath   Objective: There were no vitals taken for this visit.  Physical Exam:  Alert and oriented.  No acute distress.  Surgical incision is healing well.  No surrounding erythema or drainage.  Tolerates forward flexion to 100 degrees.  Sensation is intact in the axillary nerve distribution.  Slightly decreased sensation around the anterior surgical incision.  Slight decrease sensation to the left thumb.  Her deltoid appears to be firing.  Active motion intact to the AIN/PIN/U nerve distribution. fingers are warm and well-perfused.  IMAGING: I personally ordered and reviewed the following  images:  No new imaging obtained today.   Oliver Barre, MD 03/21/2021 11:42 PM

## 2021-03-22 ENCOUNTER — Ambulatory Visit (HOSPITAL_COMMUNITY): Payer: Medicare Other | Admitting: Occupational Therapy

## 2021-03-23 ENCOUNTER — Encounter (HOSPITAL_COMMUNITY): Payer: Self-pay | Admitting: Occupational Therapy

## 2021-03-23 ENCOUNTER — Other Ambulatory Visit: Payer: Self-pay

## 2021-03-23 ENCOUNTER — Ambulatory Visit (HOSPITAL_COMMUNITY): Payer: Medicare Other | Admitting: Occupational Therapy

## 2021-03-23 DIAGNOSIS — M25612 Stiffness of left shoulder, not elsewhere classified: Secondary | ICD-10-CM

## 2021-03-23 DIAGNOSIS — M25512 Pain in left shoulder: Secondary | ICD-10-CM

## 2021-03-23 DIAGNOSIS — R29898 Other symptoms and signs involving the musculoskeletal system: Secondary | ICD-10-CM

## 2021-03-23 NOTE — Therapy (Signed)
[facility] [place], [place], [address] Phone: [phone]   Fax:  [phone]  Occupational Therapy Treatment  Patient Details  Name: Kathryn Woods MRN: [id number] Date of Birth: [birth year] Referring Provider (OT): Dr. Larena Glassman   Encounter Date: 03/23/2021   OT End of Session - 03/23/21 1508     Visit Number 4    Number of Visits 16    Date for OT Re-Evaluation 05/07/21   mini-reassessment 04/05/21   Authorization Type [employer] Medicare, $30 copay    Authorization Time Period no visit limit    Progress Note Due on Visit 10    OT Start Time 1434    OT Stop Time 1512    OT Time Calculation (min) 38 min    Activity Tolerance Patient tolerated treatment well    Behavior During Therapy [facility] for tasks assessed/performed             Past Medical History:  Diagnosis Date   Arthritis    lumbar region, spondylolisthesis    Hypercholesteremia     Past Surgical History:  Procedure Laterality Date   BACK SURGERY     KNEE ARTHROSCOPY WITH LATERAL MENISECTOMY Right 02/25/2019   Procedure: RIGHT KNEE ARTHROSCOPY WITH LATERAL MENISECTOMY;  Surgeon: Carole Civil, MD;  Location: [facility];  Service: Orthopedics;  Laterality: Right;   REVERSE SHOULDER ARTHROPLASTY Left 02/22/2021   Procedure: LEFT REVERSE SHOULDER ARTHROPLASTY;  Surgeon: Mordecai Rasmussen, MD;  Location: [facility];  Service: Orthopedics;  Laterality: Left;   SHOULDER ARTHROSCOPY WITH OPEN ROTATOR CUFF REPAIR Right 10/15/2018   Procedure: SHOULDER ARTHROSCOPY WITH OPEN ROTATOR CUFF REPAIR;  Surgeon: Carole Civil, MD;  Location: [facility];  Service: Orthopedics;  Laterality: Right;   SHOULDER ARTHROSCOPY WITH ROTATOR CUFF REPAIR Left 08/19/2019   Procedure: LEFT SHOULDER ARTHROSCOPY WITH ROTATOR CUFF REPAIR;  Surgeon: Carole Civil, MD;  Location: [facility];  Service: Orthopedics;  Laterality: Left;  With scalene block    TUBAL LIGATION      There  were no vitals filed for this visit.   Subjective Assessment - 03/23/21 1437     Subjective  S: He said I can take the sling off.    Currently in Pain? Yes    Pain Score 3     Pain Location Shoulder    Pain Orientation Left    Pain Descriptors / Indicators Aching;Sore    Pain Radiating Towards N/A    Pain Onset 1 to 4 weeks ago    Pain Frequency Intermittent    Aggravating Factors  exercises, over use    Pain Relieving Factors rest, pain medication, ice, heat    Effect of Pain on Daily Activities mod effect on ADLs    Multiple Pain Sites No                [facility] OT Assessment - 03/23/21 1436       Assessment   Medical Diagnosis s/p left reverse TSA      Precautions   Precautions Shoulder    Type of Shoulder Precautions see protocol                      OT Treatments/Exercises (OP) - 03/23/21 1450       Exercises   Exercises Shoulder      Shoulder Exercises: Supine   Protraction PROM;5 reps;AAROM;10 reps    Horizontal ABduction PROM;5 reps;AAROM;10 reps    External Rotation  PROM;5 reps;AAROM;10 reps    Internal Rotation PROM;5 reps;AAROM;10 reps    Flexion PROM;5 reps;AAROM;10 reps    ABduction PROM;5 reps      Shoulder Exercises: Seated   Retraction AROM;12 reps    Row AROM;12 reps      Shoulder Exercises: Therapy Ball   Flexion 10 reps   2" old at end range   ABduction 10 reps   2" hold at end range     Shoulder Exercises: ROM/Strengthening   Thumb Tacks 1' low level    Prot/Ret//Elev/Dep 1'      Manual Therapy   Manual Therapy Myofascial release    Manual therapy comments completed separately from therapeutic exercise    Myofascial Release myofascial release and manual techniques to left upper arm, anterior shoulder, trapezius, and scapular regions to decrease pain and fascial restrictions and increase joint ROM.                      OT Short Term Goals - 03/09/21 1500       OT SHORT TERM GOAL #1   Title Pt will be  provided with and educated on HEP to improve ability to use LUE as non-dominant during ADLs.    Time 4    Period Weeks    Status On-going    Target Date 04/07/21      OT SHORT TERM GOAL #2   Title Pt will increase LUE P/ROM to [facility] to improve ability to perform dressing tasks with minimal compensatory strategies.    Time 4    Period Weeks    Status On-going      OT SHORT TERM GOAL #3   Title Pt will increase LUE strength to 3/5 to improve ability to reach items at waist to chest height during ADL completion.    Time 4    Period Weeks    Status On-going               OT Long Term Goals - 03/09/21 1500       OT LONG TERM GOAL #1   Title Patient will return to highest level of independence while using her LUE for all daily and household tasks.    Time 8    Period Weeks    Status On-going      OT LONG TERM GOAL #2   Title Patient will increase LUE A/ROM to [facility] in order to reach into overhead cabinets with less difficulty.    Time 8    Period Weeks    Status On-going      OT LONG TERM GOAL #3   Title Patient will decrease fascial restrictions in LUE to min amount or less in order to increase functional mobilty needed to complete reaching and dressing tasks.    Time 8    Period Weeks    Status On-going      OT LONG TERM GOAL #4   Title Patient will increase her LUE strength to 4+/5 in order to return to completing her required yardwork tasks.    Time 8    Period Weeks    Status On-going      OT LONG TERM GOAL #5   Title Patient will report a decrease in pain of approximately 3/10 or less when completing daily and household chores using her LUE.    Time 8    Period Weeks    Status On-going  Plan - 03/23/21 1501     Clinical Impression Statement A: Pt reports MD has removed sling and she is following up with him in 2 more weeks. She has continued numbness along bicep region and incision area. Continued with myofascial release to address  fascial restrictions, passive stretching. Progressed to supine AA/ROM per protocol as she is now 4 weeks out. Did not complete abduction    Body Structure / Function / Physical Skills ADL;Endurance;Muscle spasms;UE functional use;Fascial restriction;Pain;ROM;IADL;Strength    Plan P: Continue with protocol and AA/ROM    OT Home Exercise Plan eval: table slides    Consulted and Agree with Plan of Care Patient             Patient will benefit from skilled therapeutic intervention in order to improve the following deficits and impairments:   Body Structure / Function / Physical Skills: ADL, Endurance, Muscle spasms, UE functional use, Fascial restriction, Pain, ROM, IADL, Strength       Visit Diagnosis: Acute pain of left shoulder  Stiffness of left shoulder, not elsewhere classified  Other symptoms and signs involving the musculoskeletal system    Problem List Patient Active Problem List   Diagnosis Date Noted   History of failed repair of rotator cuff 02/22/2021   S/P arthroscopy of left shoulder RCR 08/19/19 08/25/2019   Nontraumatic complete tear of left rotator cuff    S/P right knee arthroscopy 02/25/2019 03/02/2019   Complex tear of lateral meniscus of right knee as current injury    Status post rotator cuff repair 10/15/2018 12/21/2018   Biceps tendon rupture, proximal, right, subsequent encounter    Primary osteoarthritis of right shoulder    Labral tear of shoulder, degenerative, right    Synovitis of right shoulder    Spondylolisthesis at L3-L4 level 12/10/2016   Elevated blood-pressure reading, without diagnosis of hypertension 11/06/2016   Bursitis, shoulder 02/24/2013   HYPERLIPIDEMIA 02/04/2007   WRIST SPRAIN, LEFT 02/04/2007   MIGRAINE HEADACHE 01/15/2007   BRONCHITIS, CHRONIC NOS 01/15/2007   CONSTIPATION 01/15/2007   IBS 01/15/2007   DISC DISEASE, LUMBOSACRAL SPINE 01/15/2007   LOW BACK PAIN, CHRONIC 01/15/2007   MALAISE AND FATIGUE 01/15/2007   Guadelupe Sabin, OTR/L  [phone] 03/23/2021, 3:12 PM  [place], [place], [address] Phone: [phone]   Fax:  [phone]  Name: Adream Parzych MRN: [id number] Date of Birth: [birth year]

## 2021-03-27 ENCOUNTER — Other Ambulatory Visit: Payer: Self-pay

## 2021-03-27 ENCOUNTER — Ambulatory Visit (HOSPITAL_COMMUNITY): Payer: Medicare Other

## 2021-03-27 DIAGNOSIS — R29898 Other symptoms and signs involving the musculoskeletal system: Secondary | ICD-10-CM

## 2021-03-27 DIAGNOSIS — M25512 Pain in left shoulder: Secondary | ICD-10-CM

## 2021-03-27 DIAGNOSIS — M25612 Stiffness of left shoulder, not elsewhere classified: Secondary | ICD-10-CM

## 2021-03-27 NOTE — Therapy (Signed)
[facility] [address] [place], [place], [address] Phone: [phone]   Fax:  [phone]  Occupational Therapy Treatment  Patient Details  Name: Kathryn Woods MRN: [id number] Date of Birth: 1951-01-02 Referring Provider (OT): Dr. Thane Edu   Encounter Date: 03/27/2021   OT End of Session - 03/27/21 1704     Visit Number 5    Number of Visits 16    Date for OT Re-Evaluation 05/07/21   mini-reassessment 04/05/21   Authorization Type [employer] Medicare, $30 copay    Authorization Time Period no visit limit    Progress Note Due on Visit 10    OT Start Time 1430    OT Stop Time 1508    OT Time Calculation (min) 38 min    Activity Tolerance Patient tolerated treatment well    Behavior During Therapy [facility] for tasks assessed/performed             Past Medical History:  Diagnosis Date   Arthritis    lumbar region, spondylolisthesis    Hypercholesteremia     Past Surgical History:  Procedure Laterality Date   BACK SURGERY     KNEE ARTHROSCOPY WITH LATERAL MENISECTOMY Right 02/25/2019   Procedure: RIGHT KNEE ARTHROSCOPY WITH LATERAL MENISECTOMY;  Surgeon: Vickki Hearing, MD;  Location: [facility];  Service: Orthopedics;  Laterality: Right;   REVERSE SHOULDER ARTHROPLASTY Left 02/22/2021   Procedure: LEFT REVERSE SHOULDER ARTHROPLASTY;  Surgeon: Oliver Barre, MD;  Location: [facility];  Service: Orthopedics;  Laterality: Left;   SHOULDER ARTHROSCOPY WITH OPEN ROTATOR CUFF REPAIR Right 10/15/2018   Procedure: SHOULDER ARTHROSCOPY WITH OPEN ROTATOR CUFF REPAIR;  Surgeon: Vickki Hearing, MD;  Location: [facility];  Service: Orthopedics;  Laterality: Right;   SHOULDER ARTHROSCOPY WITH ROTATOR CUFF REPAIR Left 08/19/2019   Procedure: LEFT SHOULDER ARTHROSCOPY WITH ROTATOR CUFF REPAIR;  Surgeon: Vickki Hearing, MD;  Location: [facility];  Service: Orthopedics;  Laterality: Left;  With scalene block    TUBAL LIGATION      There  were no vitals filed for this visit.   Subjective Assessment - 03/27/21 1640     Subjective  S: I wish I could lift my arm. It feels so heavy.    Currently in Pain? No/denies                [facility] OT Assessment - 03/27/21 1505       Assessment   Medical Diagnosis s/p left reverse TSA      Precautions   Precautions Shoulder    Type of Shoulder Precautions see protocol                      OT Treatments/Exercises (OP) - 03/27/21 1456       Exercises   Exercises Shoulder      Shoulder Exercises: Supine   Protraction PROM;5 reps;AAROM;12 reps    Horizontal ABduction PROM;5 reps;AAROM;12 reps    External Rotation PROM;5 reps;AAROM;12 reps    Internal Rotation PROM;5 reps;AAROM;12 reps    Flexion PROM;5 reps;AAROM;12 reps    ABduction PROM;5 reps      Shoulder Exercises: Standing   Protraction AAROM;10 reps      Manual Therapy   Manual Therapy Myofascial release    Manual therapy comments completed separately from therapeutic exercise    Myofascial Release myofascial release and manual techniques to left upper arm, anterior shoulder, trapezius, and scapular regions to decrease pain and fascial restrictions  and increase joint ROM.                    OT Education - 03/27/21 1516     Education Details AA/ROM supine only. No abduction yet.    Person(s) Educated Patient    Methods Explanation;Demonstration;Handout;Verbal cues;Tactile cues    Comprehension Verbalized understanding;Returned demonstration              OT Short Term Goals - 03/09/21 1500       OT SHORT TERM GOAL #1   Title Pt will be provided with and educated on HEP to improve ability to use LUE as non-dominant during ADLs.    Time 4    Period Weeks    Status On-going    Target Date 04/07/21      OT SHORT TERM GOAL #2   Title Pt will increase LUE P/ROM to [facility] to improve ability to perform dressing tasks with minimal compensatory strategies.    Time 4    Period Weeks     Status On-going      OT SHORT TERM GOAL #3   Title Pt will increase LUE strength to 3/5 to improve ability to reach items at waist to chest height during ADL completion.    Time 4    Period Weeks    Status On-going               OT Long Term Goals - 03/09/21 1500       OT LONG TERM GOAL #1   Title Patient will return to highest level of independence while using her LUE for all daily and household tasks.    Time 8    Period Weeks    Status On-going      OT LONG TERM GOAL #2   Title Patient will increase LUE A/ROM to [facility] in order to reach into overhead cabinets with less difficulty.    Time 8    Period Weeks    Status On-going      OT LONG TERM GOAL #3   Title Patient will decrease fascial restrictions in LUE to min amount or less in order to increase functional mobilty needed to complete reaching and dressing tasks.    Time 8    Period Weeks    Status On-going      OT LONG TERM GOAL #4   Title Patient will increase her LUE strength to 4+/5 in order to return to completing her required yardwork tasks.    Time 8    Period Weeks    Status On-going      OT LONG TERM GOAL #5   Title Patient will report a decrease in pain of approximately 3/10 or less when completing daily and household chores using her LUE.    Time 8    Period Weeks    Status On-going                   Plan - 03/27/21 1505     Clinical Impression Statement A: Myofascial release was completed to address moderate fascial restrictions at the left upper arm, upper trapezius, and scapularis region completed. Progressed to AA/ROM supine for all shoulder exercises except abduction due to pain. Provided AA/ROM to HEP. VC for form and technique were provided. Pain tolerance was a limiting factor during session and exercises were modified as needed.    Body Structure / Function / Physical Skills ADL;Endurance;Muscle spasms;UE functional use;Fascial restriction;Pain;ROM;IADL;Strength    Plan P:  Measure for MD appointment and route note with measurements to Dr. Dallas Schimke. Follow up on HEP. Continue to follow protocol.    OT Home Exercise Plan eval: table slides 12/20: AA/ROM except abduction (supine)    Consulted and Agree with Plan of Care Patient             Patient will benefit from skilled therapeutic intervention in order to improve the following deficits and impairments:   Body Structure / Function / Physical Skills: ADL, Endurance, Muscle spasms, UE functional use, Fascial restriction, Pain, ROM, IADL, Strength       Visit Diagnosis: Stiffness of left shoulder, not elsewhere classified  Other symptoms and signs involving the musculoskeletal system  Acute pain of left shoulder    Problem List Patient Active Problem List   Diagnosis Date Noted   History of failed repair of rotator cuff 02/22/2021   S/P arthroscopy of left shoulder RCR 08/19/19 08/25/2019   Nontraumatic complete tear of left rotator cuff    S/P right knee arthroscopy 02/25/2019 03/02/2019   Complex tear of lateral meniscus of right knee as current injury    Status post rotator cuff repair 10/15/2018 12/21/2018   Biceps tendon rupture, proximal, right, subsequent encounter    Primary osteoarthritis of right shoulder    Labral tear of shoulder, degenerative, right    Synovitis of right shoulder    Spondylolisthesis at L3-L4 level 12/10/2016   Elevated blood-pressure reading, without diagnosis of hypertension 11/06/2016   Bursitis, shoulder 02/24/2013   HYPERLIPIDEMIA 02/04/2007   WRIST SPRAIN, LEFT 02/04/2007   MIGRAINE HEADACHE 01/15/2007   BRONCHITIS, CHRONIC NOS 01/15/2007   CONSTIPATION 01/15/2007   IBS 01/15/2007   DISC DISEASE, LUMBOSACRAL SPINE 01/15/2007   LOW BACK PAIN, CHRONIC 01/15/2007   MALAISE AND FATIGUE 01/15/2007   Limmie Sabel, OTR/L,CBIS  [phone]  03/27/2021, 5:08 PM  [facility] [address] [place], [place],  [address] Phone: [phone]   Fax:  [phone]  Name: Kathryn Woods MRN: [id number] Date of Birth: 08-08-1950

## 2021-03-27 NOTE — Patient Instructions (Signed)
Perform each exercise ____10-12____ reps. 2-3x days. Complete these laying down.   1) Protraction   Start by holding a wand or cane at chest height.  Next, slowly push the wand outwards in front of your body so that your elbows become fully straightened. Then, return to the original position.     2) Shoulder FLEXION   In the standing position, hold a wand/cane with both arms, palms down on both sides. Raise up the wand/cane allowing your unaffected arm to perform most of the effort. Your affected arm should be partially relaxed.      3) Internal/External ROTATION   In the standing position, hold a wand/cane with both hands keeping your elbows bent. Move your arms and wand/cane to one side.  Your affected arm should be partially relaxed while your unaffected arm performs most of the effort.             5) Horizontal Abduction/Adduction      Straight arms holding cane at shoulder height, bring cane to right, center, left. Repeat starting to left.   Copyright  VHI. All rights reserved.

## 2021-03-28 ENCOUNTER — Ambulatory Visit (HOSPITAL_COMMUNITY): Payer: Medicare Other

## 2021-03-29 ENCOUNTER — Encounter (HOSPITAL_COMMUNITY): Payer: Medicare Other | Admitting: Occupational Therapy

## 2021-03-30 ENCOUNTER — Ambulatory Visit (HOSPITAL_COMMUNITY): Payer: Medicare Other | Admitting: Occupational Therapy

## 2021-03-30 ENCOUNTER — Encounter (HOSPITAL_COMMUNITY): Payer: Self-pay | Admitting: Occupational Therapy

## 2021-03-30 ENCOUNTER — Other Ambulatory Visit: Payer: Self-pay

## 2021-03-30 DIAGNOSIS — M25612 Stiffness of left shoulder, not elsewhere classified: Secondary | ICD-10-CM

## 2021-03-30 DIAGNOSIS — M25512 Pain in left shoulder: Secondary | ICD-10-CM

## 2021-03-30 DIAGNOSIS — R29898 Other symptoms and signs involving the musculoskeletal system: Secondary | ICD-10-CM

## 2021-03-30 NOTE — Therapy (Signed)
[facility] [address] [place], [place], [address] Phone: [phone]   Fax:  [phone]  Occupational Therapy Treatment  Patient Details  Name: Kathryn Woods MRN: [id number] Date of Birth: Aug 24, 1950 Referring Provider (OT): Dr. Thane Edu   Encounter Date: 03/30/2021   OT End of Session - 03/30/21 1250     Visit Number 6    Number of Visits 16    Date for OT Re-Evaluation 05/07/21   mini-reassessment 04/05/21   Authorization Type [employer] Medicare, $30 copay    Authorization Time Period no visit limit    Progress Note Due on Visit 10    OT Start Time 1113    OT Stop Time 1156    OT Time Calculation (min) 43 min    Activity Tolerance Patient tolerated treatment well    Behavior During Therapy [facility] for tasks assessed/performed             Past Medical History:  Diagnosis Date   Arthritis    lumbar region, spondylolisthesis    Hypercholesteremia     Past Surgical History:  Procedure Laterality Date   BACK SURGERY     KNEE ARTHROSCOPY WITH LATERAL MENISECTOMY Right 02/25/2019   Procedure: RIGHT KNEE ARTHROSCOPY WITH LATERAL MENISECTOMY;  Surgeon: Vickki Hearing, MD;  Location: [facility];  Service: Orthopedics;  Laterality: Right;   REVERSE SHOULDER ARTHROPLASTY Left 02/22/2021   Procedure: LEFT REVERSE SHOULDER ARTHROPLASTY;  Surgeon: Oliver Barre, MD;  Location: [facility];  Service: Orthopedics;  Laterality: Left;   SHOULDER ARTHROSCOPY WITH OPEN ROTATOR CUFF REPAIR Right 10/15/2018   Procedure: SHOULDER ARTHROSCOPY WITH OPEN ROTATOR CUFF REPAIR;  Surgeon: Vickki Hearing, MD;  Location: [facility];  Service: Orthopedics;  Laterality: Right;   SHOULDER ARTHROSCOPY WITH ROTATOR CUFF REPAIR Left 08/19/2019   Procedure: LEFT SHOULDER ARTHROSCOPY WITH ROTATOR CUFF REPAIR;  Surgeon: Vickki Hearing, MD;  Location: [facility];  Service: Orthopedics;  Laterality: Left;  With scalene block    TUBAL LIGATION      There  were no vitals filed for this visit.   Subjective Assessment - 03/30/21 1113     Subjective  S: I washed my floor last night so it is a little sore.    Currently in Pain? Yes    Pain Score 4     Pain Location Shoulder    Pain Orientation Left    Pain Descriptors / Indicators Sore    Pain Type Acute pain    Pain Radiating Towards n/a    Pain Onset 1 to 4 weeks ago    Pain Frequency Intermittent    Aggravating Factors  exercise, over use    Pain Relieving Factors rest, pain medications, ice , heat    Effect of Pain on Daily Activities mod effect    Multiple Pain Sites No                [facility] OT Assessment - 03/30/21 0001       Assessment   Medical Diagnosis s/p left reverse TSA      Precautions   Precautions Shoulder    Type of Shoulder Precautions see protocol      AROM   Overall AROM Comments assessed setaed, er/IR    AROM Assessment Site Shoulder    Right/Left Shoulder Left    Left Shoulder Flexion 62 Degrees    Left Shoulder ABduction 51 Degrees    Left Shoulder Internal Rotation 40 Degrees  Left Shoulder External Rotation 62 Degrees      PROM   Overall PROM Comments Assessed seated, er/IR adducted    PROM Assessment Site Shoulder    Right/Left Shoulder Left    Left Shoulder Flexion 152 Degrees    Left Shoulder ABduction 130 Degrees    Left Shoulder Internal Rotation 43 Degrees    Left Shoulder External Rotation 71 Degrees                      OT Treatments/Exercises (OP) - 03/30/21 0001       Exercises   Exercises Shoulder      Shoulder Exercises: Supine   Protraction PROM;5 reps;AAROM;12 reps    Horizontal ABduction PROM;5 reps;AAROM;12 reps    External Rotation PROM;5 reps;AAROM;12 reps    Internal Rotation PROM;5 reps;AAROM;12 reps    Flexion PROM;5 reps;AAROM;12 reps    ABduction PROM;10 reps      Shoulder Exercises: Seated   Protraction AAROM;10 reps    Horizontal ABduction AAROM;10 reps    External Rotation AAROM;10 reps     Internal Rotation AAROM;10 reps    Flexion AAROM;10 reps      Shoulder Exercises: Stretch   Other Shoulder Stretches PVC pipe stretch 2x5 reps. Decrease of angle of pipe for less difficulty. Had pt hold tissue paper to reduce friction on pipe.      Manual Therapy   Manual Therapy Myofascial release    Manual therapy comments completed separately from therapeutic exercise    Myofascial Release myofascial release and manual techniques to left upper arm, anterior shoulder, trapezius, and scapular regions to decrease pain and fascial restrictions and increase joint ROM.                      OT Short Term Goals - 03/09/21 1500       OT SHORT TERM GOAL #1   Title Pt will be provided with and educated on HEP to improve ability to use LUE as non-dominant during ADLs.    Time 4    Period Weeks    Status On-going    Target Date 04/07/21      OT SHORT TERM GOAL #2   Title Pt will increase LUE P/ROM to [facility] to improve ability to perform dressing tasks with minimal compensatory strategies.    Time 4    Period Weeks    Status On-going      OT SHORT TERM GOAL #3   Title Pt will increase LUE strength to 3/5 to improve ability to reach items at waist to chest height during ADL completion.    Time 4    Period Weeks    Status On-going               OT Long Term Goals - 03/09/21 1500       OT LONG TERM GOAL #1   Title Patient will return to highest level of independence while using her LUE for all daily and household tasks.    Time 8    Period Weeks    Status On-going      OT LONG TERM GOAL #2   Title Patient will increase LUE A/ROM to [facility] in order to reach into overhead cabinets with less difficulty.    Time 8    Period Weeks    Status On-going      OT LONG TERM GOAL #3   Title Patient will decrease fascial restrictions in LUE to min  amount or less in order to increase functional mobilty needed to complete reaching and dressing tasks.    Time 8    Period Weeks     Status On-going      OT LONG TERM GOAL #4   Title Patient will increase her LUE strength to 4+/5 in order to return to completing her required yardwork tasks.    Time 8    Period Weeks    Status On-going      OT LONG TERM GOAL #5   Title Patient will report a decrease in pain of approximately 3/10 or less when completing daily and household chores using her LUE.    Time 8    Period Weeks    Status On-going                   Plan - 03/30/21 1252     Clinical Impression Statement A: Myofascial release completed again this date to address fascial restrictions to L upper arm, trapezius, and scpularis region. Pt completed supine AA/ROM and seated AA/ROM except for abduction due to pain. Pt compelted PVC pipe stretches to address abduction. Pt ROM measurements taken to be send to MD for updat. Pt demonstrates increased P/ROM and ability to be measured for A/ROM.    Body Structure / Function / Physical Skills ADL;Endurance;Muscle spasms;UE functional use;Fascial restriction;Pain;ROM;IADL;Strength    Plan P: Continue AA/ROM after mofascial release and P/ROM. Continue PVC pipe stretch. Follow protocol.    OT Home Exercise Plan eval: table slides 12/20: AA/ROM except abduction (supine)             Patient will benefit from skilled therapeutic intervention in order to improve the following deficits and impairments:   Body Structure / Function / Physical Skills: ADL, Endurance, Muscle spasms, UE functional use, Fascial restriction, Pain, ROM, IADL, Strength       Visit Diagnosis: Stiffness of left shoulder, not elsewhere classified  Other symptoms and signs involving the musculoskeletal system  Acute pain of left shoulder    Problem List Patient Active Problem List   Diagnosis Date Noted   History of failed repair of rotator cuff 02/22/2021   S/P arthroscopy of left shoulder RCR 08/19/19 08/25/2019   Nontraumatic complete tear of left rotator cuff    S/P right knee  arthroscopy 02/25/2019 03/02/2019   Complex tear of lateral meniscus of right knee as current injury    Status post rotator cuff repair 10/15/2018 12/21/2018   Biceps tendon rupture, proximal, right, subsequent encounter    Primary osteoarthritis of right shoulder    Labral tear of shoulder, degenerative, right    Synovitis of right shoulder    Spondylolisthesis at L3-L4 level 12/10/2016   Elevated blood-pressure reading, without diagnosis of hypertension 11/06/2016   Bursitis, shoulder 02/24/2013   HYPERLIPIDEMIA 02/04/2007   WRIST SPRAIN, LEFT 02/04/2007   MIGRAINE HEADACHE 01/15/2007   BRONCHITIS, CHRONIC NOS 01/15/2007   CONSTIPATION 01/15/2007   IBS 01/15/2007   DISC DISEASE, LUMBOSACRAL SPINE 01/15/2007   LOW BACK PAIN, CHRONIC 01/15/2007   MALAISE AND FATIGUE 01/15/2007   Danie Chandler OT, MOT  Danie Chandler, OT 03/30/2021, 12:54 PM  [facility] [facility] [address] [place], [place], [address] Phone: [phone]   Fax:  [phone]  Name: Kathryn Woods MRN: [id number] Date of Birth: 1950/11/01

## 2021-04-04 ENCOUNTER — Ambulatory Visit (INDEPENDENT_AMBULATORY_CARE_PROVIDER_SITE_OTHER): Payer: Medicare Other | Admitting: Orthopedic Surgery

## 2021-04-04 ENCOUNTER — Encounter (HOSPITAL_COMMUNITY): Payer: Self-pay | Admitting: Occupational Therapy

## 2021-04-04 ENCOUNTER — Other Ambulatory Visit: Payer: Self-pay

## 2021-04-04 ENCOUNTER — Ambulatory Visit (HOSPITAL_COMMUNITY): Payer: Medicare Other | Admitting: Occupational Therapy

## 2021-04-04 ENCOUNTER — Ambulatory Visit: Payer: Medicare Other

## 2021-04-04 ENCOUNTER — Encounter: Payer: Self-pay | Admitting: Orthopedic Surgery

## 2021-04-04 DIAGNOSIS — Z96612 Presence of left artificial shoulder joint: Secondary | ICD-10-CM

## 2021-04-04 DIAGNOSIS — R29898 Other symptoms and signs involving the musculoskeletal system: Secondary | ICD-10-CM

## 2021-04-04 DIAGNOSIS — M25512 Pain in left shoulder: Secondary | ICD-10-CM

## 2021-04-04 DIAGNOSIS — M25612 Stiffness of left shoulder, not elsewhere classified: Secondary | ICD-10-CM

## 2021-04-04 MED ORDER — HYDROCODONE-ACETAMINOPHEN 5-325 MG PO TABS: 1.0000 | ORAL_TABLET | Freq: Four times a day (QID) | ORAL | 0 refills | Status: DC | PRN

## 2021-04-04 NOTE — Progress Notes (Signed)
Orthopaedic Postop Note  Assessment: Kathryn Woods is a 70 y.o. female s/p Left Reverse Shoulder Arthroplasty, in setting of failed rotator cuff repair  DOS: 02/22/2021  Plan: Overall, she is improving.  The numbness and tingling she is experiencing early in her recovery has almost completely resolved.  She notes worsening pain with physical therapy, and is requesting an additional prescription for medication.  This was provided to her today.  Once again, we discussed limiting the continued use of narcotics.  I provided her with reassurance, stating that she is still relatively early in her recovery.  As her range of motion and strength improves, her pain will continue to improve.  She stated her understanding.  Follow-up in 6 weeks for repeat evaluation.   Follow-up: Return in about 6 weeks (around 05/16/2021).  XR at next visit: Left shoulder  Subjective:  Chief Complaint  Patient presents with   Routine Post Op    DOS 02/22/21 S/p reverse total replacement left shoulder    History of Present Illness: Kathryn Woods is a 70 y.o. female who presents following the above stated procedure.  Surgery was 6 weeks ago.  She is doing better.  The numbness is almost completely resolved.  She is working well physical therapy.  She notes worsening pain when she finishes physical therapy.  She has little pain with passive range of motion, but states it is very painful for her when she moves her shoulder.  No issues with her surgical incision.  She continues to use pain medications to help with therapy.   Review of Systems: No fevers or chills No numbness or tingling No Chest Pain No shortness of breath   Objective: There were no vitals taken for this visit.  Physical Exam:  Alert and oriented.  No acute distress.  Surgical incision is healing well.  No surrounding erythema or drainage.  Tolerates forward flexion to 120 degrees.  Sensation is intact in the  axillary nerve distribution.  Slightly decreased sensation around the anterior surgical incision.  Slight decrease sensation to the left thumb.  Her deltoid is firing.  Active motion intact to the AIN/PIN/U nerve distribution. fingers are warm and well-perfused.  Passive abduction to 90 degrees.  She is able to reach out to her side, a combination of external rotation and abduction.  IMAGING: I personally ordered and reviewed the following images:  X-rays of the left shoulder were obtained in clinic today.  These were compared to previous x-rays.  There is a well-positioned reverse shoulder arthroplasty, without evidence of acute injury.  No evidence of hardware failure or loosening.  The implants have not subsided.  Impression: Left reverse shoulder arthroplasty in excellent position.   Oliver Barre, MD 04/04/2021 10:41 AM

## 2021-04-04 NOTE — Therapy (Signed)
[facility] [address] [place], [place], [address] Phone: [phone]   Fax:  [phone]  Occupational Therapy Treatment  Patient Details  Name: Kathryn Woods MRN: [id number] Date of Birth: 1950/10/05 Referring Provider (OT): Dr. Thane Edu   Encounter Date: 04/04/2021   OT End of Session - 04/04/21 1431     Visit Number 7    Number of Visits 16    Date for OT Re-Evaluation 05/07/21   mini-reassessment 04/05/21   Authorization Type [employer] Medicare, $30 copay    Authorization Time Period no visit limit    Progress Note Due on Visit 10    OT Start Time 1346    OT Stop Time 1425    OT Time Calculation (min) 39 min    Activity Tolerance Patient tolerated treatment well    Behavior During Therapy [facility] for tasks assessed/performed             Past Medical History:  Diagnosis Date   Arthritis    lumbar region, spondylolisthesis    Hypercholesteremia     Past Surgical History:  Procedure Laterality Date   BACK SURGERY     KNEE ARTHROSCOPY WITH LATERAL MENISECTOMY Right 02/25/2019   Procedure: RIGHT KNEE ARTHROSCOPY WITH LATERAL MENISECTOMY;  Surgeon: Vickki Hearing, MD;  Location: [facility];  Service: Orthopedics;  Laterality: Right;   REVERSE SHOULDER ARTHROPLASTY Left 02/22/2021   Procedure: LEFT REVERSE SHOULDER ARTHROPLASTY;  Surgeon: Oliver Barre, MD;  Location: [facility];  Service: Orthopedics;  Laterality: Left;   SHOULDER ARTHROSCOPY WITH OPEN ROTATOR CUFF REPAIR Right 10/15/2018   Procedure: SHOULDER ARTHROSCOPY WITH OPEN ROTATOR CUFF REPAIR;  Surgeon: Vickki Hearing, MD;  Location: [facility];  Service: Orthopedics;  Laterality: Right;   SHOULDER ARTHROSCOPY WITH ROTATOR CUFF REPAIR Left 08/19/2019   Procedure: LEFT SHOULDER ARTHROSCOPY WITH ROTATOR CUFF REPAIR;  Surgeon: Vickki Hearing, MD;  Location: [facility];  Service: Orthopedics;  Laterality: Left;  With scalene block    TUBAL LIGATION      There  were no vitals filed for this visit.   Subjective Assessment - 04/04/21 1346     Subjective  S: The exercises are going ok I guess.    Currently in Pain? No/denies                [facility] OT Assessment - 04/04/21 1346       Assessment   Medical Diagnosis s/p left reverse TSA      Precautions   Precautions Shoulder    Type of Shoulder Precautions see protocol                      OT Treatments/Exercises (OP) - 04/04/21 1347       Exercises   Exercises Shoulder      Shoulder Exercises: Supine   Protraction PROM;5 reps;AAROM;12 reps    Horizontal ABduction PROM;5 reps;AAROM;12 reps    External Rotation PROM;5 reps;AAROM;12 reps    Internal Rotation PROM;5 reps;AAROM;12 reps    Flexion PROM;5 reps;AAROM;12 reps    ABduction PROM;5 reps;AAROM;10 reps      Shoulder Exercises: Standing   Protraction AAROM;10 reps    Horizontal ABduction AAROM;10 reps    External Rotation AAROM;10 reps    Internal Rotation AAROM;10 reps    Flexion AAROM;10 reps    ABduction AAROM;10 reps    Extension Theraband;10 reps    Theraband Level (Shoulder Extension) Level 2 (Red)    Row  Theraband;10 reps    Theraband Level (Shoulder Row) Level 2 (Red)      Shoulder Exercises: Pulleys   Flexion 2 minutes   seated   ABduction 2 minutes   seated     Shoulder Exercises: ROM/Strengthening   Other ROM/Strengthening Exercises PVC pipe slide: 10X flexion      Manual Therapy   Manual Therapy Myofascial release    Manual therapy comments completed separately from therapeutic exercise    Myofascial Release myofascial release and manual techniques to left upper arm, anterior shoulder, trapezius, and scapular regions to decrease pain and fascial restrictions and increase joint ROM.                      OT Short Term Goals - 03/09/21 1500       OT SHORT TERM GOAL #1   Title Pt will be provided with and educated on HEP to improve ability to use LUE as non-dominant during  ADLs.    Time 4    Period Weeks    Status On-going    Target Date 04/07/21      OT SHORT TERM GOAL #2   Title Pt will increase LUE P/ROM to [facility] to improve ability to perform dressing tasks with minimal compensatory strategies.    Time 4    Period Weeks    Status On-going      OT SHORT TERM GOAL #3   Title Pt will increase LUE strength to 3/5 to improve ability to reach items at waist to chest height during ADL completion.    Time 4    Period Weeks    Status On-going               OT Long Term Goals - 03/09/21 1500       OT LONG TERM GOAL #1   Title Patient will return to highest level of independence while using her LUE for all daily and household tasks.    Time 8    Period Weeks    Status On-going      OT LONG TERM GOAL #2   Title Patient will increase LUE A/ROM to [facility] in order to reach into overhead cabinets with less difficulty.    Time 8    Period Weeks    Status On-going      OT LONG TERM GOAL #3   Title Patient will decrease fascial restrictions in LUE to min amount or less in order to increase functional mobilty needed to complete reaching and dressing tasks.    Time 8    Period Weeks    Status On-going      OT LONG TERM GOAL #4   Title Patient will increase her LUE strength to 4+/5 in order to return to completing her required yardwork tasks.    Time 8    Period Weeks    Status On-going      OT LONG TERM GOAL #5   Title Patient will report a decrease in pain of approximately 3/10 or less when completing daily and household chores using her LUE.    Time 8    Period Weeks    Status On-going                   Plan - 04/04/21 1409     Clinical Impression Statement A: Continued with myofascial release to LUE to address fascial restrictions. Continued with passive stretching, er is the most painful today. Continued with supine AA/ROM  and progressed to standing AA/ROM, pt with mod to max difficulty achieving ROM greater than shoulder height for  flexion and abduction. Added scapular theraband row and extension and pulleys today. Verbal cuing for form and technique.    Body Structure / Function / Physical Skills ADL;Endurance;Muscle spasms;UE functional use;Fascial restriction;Pain;ROM;IADL;Strength    Plan P: Mini-reassessment and progress note, add wall wash    OT Home Exercise Plan eval: table slides 12/20: AA/ROM except abduction (supine); 12/29: progress AA/ROM to standing    Consulted and Agree with Plan of Care Patient             Patient will benefit from skilled therapeutic intervention in order to improve the following deficits and impairments:   Body Structure / Function / Physical Skills: ADL, Endurance, Muscle spasms, UE functional use, Fascial restriction, Pain, ROM, IADL, Strength       Visit Diagnosis: Stiffness of left shoulder, not elsewhere classified  Other symptoms and signs involving the musculoskeletal system  Acute pain of left shoulder    Problem List Patient Active Problem List   Diagnosis Date Noted   History of failed repair of rotator cuff 02/22/2021   S/P arthroscopy of left shoulder RCR 08/19/19 08/25/2019   Nontraumatic complete tear of left rotator cuff    S/P right knee arthroscopy 02/25/2019 03/02/2019   Complex tear of lateral meniscus of right knee as current injury    Status post rotator cuff repair 10/15/2018 12/21/2018   Biceps tendon rupture, proximal, right, subsequent encounter    Primary osteoarthritis of right shoulder    Labral tear of shoulder, degenerative, right    Synovitis of right shoulder    Spondylolisthesis at L3-L4 level 12/10/2016   Elevated blood-pressure reading, without diagnosis of hypertension 11/06/2016   Bursitis, shoulder 02/24/2013   HYPERLIPIDEMIA 02/04/2007   WRIST SPRAIN, LEFT 02/04/2007   MIGRAINE HEADACHE 01/15/2007   BRONCHITIS, CHRONIC NOS 01/15/2007   CONSTIPATION 01/15/2007   IBS 01/15/2007   DISC DISEASE, LUMBOSACRAL SPINE 01/15/2007    LOW BACK PAIN, CHRONIC 01/15/2007   MALAISE AND FATIGUE 01/15/2007    Ezra Sites, OTR/L  [phone] 04/04/2021, 2:32 PM  [facility] [address] [place], [place], [address] Phone: [phone]   Fax:  [phone]  Name: Kathryn Woods MRN: [id number] Date of Birth: 19-Aug-1950

## 2021-04-06 ENCOUNTER — Other Ambulatory Visit: Payer: Self-pay

## 2021-04-06 ENCOUNTER — Encounter (HOSPITAL_COMMUNITY): Payer: Self-pay | Admitting: Occupational Therapy

## 2021-04-06 ENCOUNTER — Ambulatory Visit (HOSPITAL_COMMUNITY): Payer: Medicare Other | Admitting: Occupational Therapy

## 2021-04-06 DIAGNOSIS — M25612 Stiffness of left shoulder, not elsewhere classified: Secondary | ICD-10-CM

## 2021-04-06 DIAGNOSIS — M25512 Pain in left shoulder: Secondary | ICD-10-CM | POA: Diagnosis not present

## 2021-04-06 DIAGNOSIS — R29898 Other symptoms and signs involving the musculoskeletal system: Secondary | ICD-10-CM

## 2021-04-06 NOTE — Therapy (Signed)
[facility] [address] [place], [place], [address] Phone: [phone]   Fax:  [phone]  Occupational Therapy Treatment  Patient Details  Name: Kathryn Woods MRN: [id number] Date of Birth: July 03, 1950 Referring Provider (OT): Dr. Thane Edu   Encounter Date: 04/06/2021   OT End of Session - 04/06/21 1511     Visit Number 8    Number of Visits 16    Date for OT Re-Evaluation 05/07/21   mini-reassessment 04/05/21   Authorization Type [employer] Medicare, $30 copay    Authorization Time Period no visit limit    Progress Note Due on Visit 10    OT Start Time 1433    OT Stop Time 1511    OT Time Calculation (min) 38 min    Activity Tolerance Patient tolerated treatment well    Behavior During Therapy [facility] for tasks assessed/performed             Past Medical History:  Diagnosis Date   Arthritis    lumbar region, spondylolisthesis    Hypercholesteremia     Past Surgical History:  Procedure Laterality Date   BACK SURGERY     KNEE ARTHROSCOPY WITH LATERAL MENISECTOMY Right 02/25/2019   Procedure: RIGHT KNEE ARTHROSCOPY WITH LATERAL MENISECTOMY;  Surgeon: Vickki Hearing, MD;  Location: [facility];  Service: Orthopedics;  Laterality: Right;   REVERSE SHOULDER ARTHROPLASTY Left 02/22/2021   Procedure: LEFT REVERSE SHOULDER ARTHROPLASTY;  Surgeon: Oliver Barre, MD;  Location: [facility];  Service: Orthopedics;  Laterality: Left;   SHOULDER ARTHROSCOPY WITH OPEN ROTATOR CUFF REPAIR Right 10/15/2018   Procedure: SHOULDER ARTHROSCOPY WITH OPEN ROTATOR CUFF REPAIR;  Surgeon: Vickki Hearing, MD;  Location: [facility];  Service: Orthopedics;  Laterality: Right;   SHOULDER ARTHROSCOPY WITH ROTATOR CUFF REPAIR Left 08/19/2019   Procedure: LEFT SHOULDER ARTHROSCOPY WITH ROTATOR CUFF REPAIR;  Surgeon: Vickki Hearing, MD;  Location: [facility];  Service: Orthopedics;  Laterality: Left;  With scalene block    TUBAL LIGATION      There  were no vitals filed for this visit.   Subjective Assessment - 04/06/21 1431     Subjective  S:    Currently in Pain? Yes    Pain Score 7     Pain Location Shoulder    Pain Orientation Left    Pain Descriptors / Indicators Sore    Pain Type Acute pain    Pain Radiating Towards N/A    Pain Onset More than a month ago    Pain Frequency Intermittent    Aggravating Factors  exercises, overuse    Pain Relieving Factors rest, pain medications, ice, heat    Effect of Pain on Daily Activities mod effect on ADLs    Multiple Pain Sites No                [facility] OT Assessment - 04/06/21 1431       Assessment   Medical Diagnosis s/p left reverse TSA      Precautions   Precautions Shoulder    Type of Shoulder Precautions see protocol                      OT Treatments/Exercises (OP) - 04/06/21 1434       Exercises   Exercises Shoulder      Shoulder Exercises: Supine   Protraction PROM;5 reps;AAROM;12 reps    Horizontal ABduction PROM;5 reps;AAROM;12 reps    External Rotation PROM;5 reps;AAROM;12  reps    Internal Rotation PROM;5 reps;AAROM;12 reps    Flexion PROM;5 reps;AAROM;12 reps    ABduction PROM;5 reps;AAROM;10 reps      Shoulder Exercises: Standing   Protraction AAROM;10 reps    Horizontal ABduction AAROM;10 reps    External Rotation AAROM;10 reps    Internal Rotation AAROM;10 reps    Flexion AAROM;10 reps    ABduction AAROM;10 reps    Extension Theraband;10 reps    Theraband Level (Shoulder Extension) Level 2 (Red)    Row Theraband;10 reps    Theraband Level (Shoulder Row) Level 2 (Red)      Shoulder Exercises: Pulleys   Flexion 1 minute    ABduction 1 minute      Shoulder Exercises: ROM/Strengthening   Wall Wash 1'    Proximal Shoulder Strengthening, Supine 10X each, no rest breaks      Manual Therapy   Manual Therapy Myofascial release    Manual therapy comments completed separately from therapeutic exercise    Myofascial Release  myofascial release and manual techniques to left upper arm, anterior shoulder, trapezius, and scapular regions to decrease pain and fascial restrictions and increase joint ROM.                      OT Short Term Goals - 03/09/21 1500       OT SHORT TERM GOAL #1   Title Pt will be provided with and educated on HEP to improve ability to use LUE as non-dominant during ADLs.    Time 4    Period Weeks    Status On-going    Target Date 04/07/21      OT SHORT TERM GOAL #2   Title Pt will increase LUE P/ROM to [facility] to improve ability to perform dressing tasks with minimal compensatory strategies.    Time 4    Period Weeks    Status On-going      OT SHORT TERM GOAL #3   Title Pt will increase LUE strength to 3/5 to improve ability to reach items at waist to chest height during ADL completion.    Time 4    Period Weeks    Status On-going               OT Long Term Goals - 03/09/21 1500       OT LONG TERM GOAL #1   Title Patient will return to highest level of independence while using her LUE for all daily and household tasks.    Time 8    Period Weeks    Status On-going      OT LONG TERM GOAL #2   Title Patient will increase LUE A/ROM to [facility] in order to reach into overhead cabinets with less difficulty.    Time 8    Period Weeks    Status On-going      OT LONG TERM GOAL #3   Title Patient will decrease fascial restrictions in LUE to min amount or less in order to increase functional mobilty needed to complete reaching and dressing tasks.    Time 8    Period Weeks    Status On-going      OT LONG TERM GOAL #4   Title Patient will increase her LUE strength to 4+/5 in order to return to completing her required yardwork tasks.    Time 8    Period Weeks    Status On-going      OT LONG TERM GOAL #5  Title Patient will report a decrease in pain of approximately 3/10 or less when completing daily and household chores using her LUE.    Time 8    Period Weeks     Status On-going                   Plan - 04/06/21 1457     Clinical Impression Statement A: Continued with myofascial release to LUE to address fascial restrictions, passive stretching completed with ROM WFL, er continues to be the most difficult. Pt completing AA/ROM supine and standing, added proximal shoulder strengthening in supine. Continued with scapular theraband and pulleys. Verbal cuing for form and technique.    Body Structure / Function / Physical Skills ADL;Endurance;Muscle spasms;UE functional use;Fascial restriction;Pain;ROM;IADL;Strength    Plan P: Mini-reassessment and progress note, resume pvc pipe slide    OT Home Exercise Plan eval: table slides 12/20: AA/ROM except abduction (supine); 12/29: progress AA/ROM to standing    Consulted and Agree with Plan of Care Patient             Patient will benefit from skilled therapeutic intervention in order to improve the following deficits and impairments:   Body Structure / Function / Physical Skills: ADL, Endurance, Muscle spasms, UE functional use, Fascial restriction, Pain, ROM, IADL, Strength       Visit Diagnosis: Stiffness of left shoulder, not elsewhere classified  Other symptoms and signs involving the musculoskeletal system  Acute pain of left shoulder    Problem List Patient Active Problem List   Diagnosis Date Noted   History of failed repair of rotator cuff 02/22/2021   S/P arthroscopy of left shoulder RCR 08/19/19 08/25/2019   Nontraumatic complete tear of left rotator cuff    S/P right knee arthroscopy 02/25/2019 03/02/2019   Complex tear of lateral meniscus of right knee as current injury    Status post rotator cuff repair 10/15/2018 12/21/2018   Biceps tendon rupture, proximal, right, subsequent encounter    Primary osteoarthritis of right shoulder    Labral tear of shoulder, degenerative, right    Synovitis of right shoulder    Spondylolisthesis at L3-L4 level 12/10/2016   Elevated  blood-pressure reading, without diagnosis of hypertension 11/06/2016   Bursitis, shoulder 02/24/2013   HYPERLIPIDEMIA 02/04/2007   WRIST SPRAIN, LEFT 02/04/2007   MIGRAINE HEADACHE 01/15/2007   BRONCHITIS, CHRONIC NOS 01/15/2007   CONSTIPATION 01/15/2007   IBS 01/15/2007   DISC DISEASE, LUMBOSACRAL SPINE 01/15/2007   LOW BACK PAIN, CHRONIC 01/15/2007   MALAISE AND FATIGUE 01/15/2007    Ezra Sites, OTR/L  [phone] 04/06/2021, 3:12 PM  [facility] [address] [place], [place], [address] Phone: [phone]   Fax:  [phone]  Name: Karessa Onorato MRN: [id number] Date of Birth: 12-29-1950

## 2021-04-10 ENCOUNTER — Ambulatory Visit (HOSPITAL_COMMUNITY): Payer: Medicare Other | Attending: Orthopedic Surgery

## 2021-04-10 ENCOUNTER — Other Ambulatory Visit: Payer: Self-pay

## 2021-04-10 DIAGNOSIS — R29898 Other symptoms and signs involving the musculoskeletal system: Secondary | ICD-10-CM | POA: Insufficient documentation

## 2021-04-10 DIAGNOSIS — M25512 Pain in left shoulder: Secondary | ICD-10-CM | POA: Diagnosis present

## 2021-04-10 DIAGNOSIS — M25612 Stiffness of left shoulder, not elsewhere classified: Secondary | ICD-10-CM | POA: Diagnosis present

## 2021-04-10 NOTE — Therapy (Signed)
[place], [place], [address] Phone: [phone]   Fax:  [phone]  Occupational Therapy Treatment  Patient Details  Name: Kathryn Woods MRN: [id number] Date of Birth: 09-08-1950 Referring Provider (OT): Dr. Larena Glassman  Progress Note Reporting Period 03/07/21 to 04/10/20  See note below for Objective Data and Assessment of Progress/Goals.      Encounter Date: 04/10/2021   OT End of Session - 04/10/21 1535     Visit Number 9    Number of Visits 16    Date for OT Re-Evaluation 05/07/21    Authorization Type [employer] Medicare, $30 copay    Authorization Time Period no visit limit    Progress Note Due on Visit 19    OT Start Time 1433    OT Stop Time 1512    OT Time Calculation (min) 39 min    Activity Tolerance Patient tolerated treatment well    Behavior During Therapy WFL for tasks assessed/performed             Past Medical History:  Diagnosis Date   Arthritis    lumbar region, spondylolisthesis    Hypercholesteremia     Past Surgical History:  Procedure Laterality Date   BACK SURGERY     KNEE ARTHROSCOPY WITH LATERAL MENISECTOMY Right 02/25/2019   Procedure: RIGHT KNEE ARTHROSCOPY WITH LATERAL MENISECTOMY;  Surgeon: Carole Civil, MD;  Location: [facility];  Service: Orthopedics;  Laterality: Right;   REVERSE SHOULDER ARTHROPLASTY Left 02/22/2021   Procedure: LEFT REVERSE SHOULDER ARTHROPLASTY;  Surgeon: Mordecai Rasmussen, MD;  Location: [facility];  Service: Orthopedics;  Laterality: Left;   SHOULDER ARTHROSCOPY WITH OPEN ROTATOR CUFF REPAIR Right 10/15/2018   Procedure: SHOULDER ARTHROSCOPY WITH OPEN ROTATOR CUFF REPAIR;  Surgeon: Carole Civil, MD;  Location: [facility];  Service: Orthopedics;  Laterality: Right;   SHOULDER ARTHROSCOPY WITH ROTATOR CUFF REPAIR Left 08/19/2019   Procedure: LEFT SHOULDER ARTHROSCOPY WITH ROTATOR CUFF REPAIR;  Surgeon: Carole Civil, MD;  Location: [facility];  Service: Orthopedics;  Laterality: Left;  With scalene block    TUBAL LIGATION      There were no vitals filed for this visit.   Subjective Assessment - 04/10/21 1436     Subjective  S: I didn't do anything diffrent but Saturday my shoulder started hurting really bad and it's been like that since.    Currently in Pain? Yes    Pain Score 6     Pain Location Shoulder    Pain Orientation Left    Pain Descriptors / Indicators Aching;Constant    Pain Type Acute pain    Pain Onset In the past 7 days    Pain Frequency Constant    Aggravating Factors  unknown cause    Pain Relieving Factors pain medication,heat    Effect of Pain on Daily Activities max effect                [facility] OT Assessment - 04/10/21 1438       Assessment   Medical Diagnosis s/p left reverse TSA    Next MD Visit 05/16/21      Precautions   Precautions Shoulder    Type of Shoulder Precautions see protocol      Observation/Other Assessments   Focus on Therapeutic Outcomes (FOTO)  54/100      AROM   Overall AROM Comments assessed seated and supine, er/IR adducted. Assessed seated previously.    AROM Assessment  Site Shoulder    Right/Left Shoulder Left    Left Shoulder Flexion 70 Degrees   previous: 62 Supine: 140   Left Shoulder ABduction 51 Degrees   previous: same Supine: 125   Left Shoulder Internal Rotation 90 Degrees   previous: 40 Supine: 90   Left Shoulder External Rotation 50 Degrees   previous: 62 Supine: 50     PROM   Overall PROM Comments Assessed seated, er/IR abducted    PROM Assessment Site Shoulder    Right/Left Shoulder Left    Left Shoulder Flexion 153 Degrees   previous: 152   Left Shoulder ABduction 135 Degrees   previous: 130   Left Shoulder Internal Rotation 70 Degrees   previous: 43   Left Shoulder External Rotation 60 Degrees   previous: 71     Strength   Overall Strength Comments Assessed IR/er Adducted. Not assessed prior to this date.    Strength Assessment Site  Shoulder    Right/Left Shoulder Left    Left Shoulder Flexion 3-/5    Left Shoulder ABduction 3-/5    Left Shoulder Internal Rotation 4-/5    Left Shoulder External Rotation 4-/5                                OT Short Term Goals - 04/10/21 1507       OT SHORT TERM GOAL #1   Title Pt will be provided with and educated on HEP to improve ability to use LUE as non-dominant during ADLs.    Time 4    Period Weeks    Status Achieved    Target Date 04/07/21      OT SHORT TERM GOAL #2   Title Pt will increase LUE P/ROM to [facility] to improve ability to perform dressing tasks with minimal compensatory strategies.    Time 4    Period Weeks    Status Achieved      OT SHORT TERM GOAL #3   Title Pt will increase LUE strength to 3/5 to improve ability to reach items at waist to chest height during ADL completion.    Time 4    Period Weeks    Status Partially Met               OT Long Term Goals - 03/09/21 1500       OT LONG TERM GOAL #1   Title Patient will return to highest level of independence while using her LUE for all daily and household tasks.    Time 8    Period Weeks    Status On-going      OT LONG TERM GOAL #2   Title Patient will increase LUE A/ROM to [facility] in order to reach into overhead cabinets with less difficulty.    Time 8    Period Weeks    Status On-going      OT LONG TERM GOAL #3   Title Patient will decrease fascial restrictions in LUE to min amount or less in order to increase functional mobilty needed to complete reaching and dressing tasks.    Time 8    Period Weeks    Status On-going      OT LONG TERM GOAL #4   Title Patient will increase her LUE strength to 4+/5 in order to return to completing her required yardwork tasks.    Time 8    Period Weeks    Status On-going  OT LONG TERM GOAL #5   Title Patient will report a decrease in pain of approximately 3/10 or less when completing daily and household chores using her LUE.     Time 8    Period Weeks    Status On-going                   Plan - 04/10/21 1542     Clinical Impression Statement A: 10th visit progress note completed this date. Patient has met 2/3 STGs with the third goal partially met. Since starting therapy, patient is out of the sling and beginning to use her left arm as her nondominant extremity for simple ADL tasks. She reports that her ROM is improving. She is able to do the dishes now. She peeled potatoes recently and that was difficult. She continues to have deficits with ROM and strength. She is able to complete low level reaching tasks with min-mod difficulty. When completing A/ROM supine, she is able to achieve further ROM than standing against gravity. FOTO score has increased to 54/100 from 28/100.    Body Structure / Function / Physical Skills ADL;Endurance;Muscle spasms;UE functional use;Fascial restriction;Pain;ROM;IADL;Strength    Plan P: Follow up on pain level (7/10 at this visit). Resume pvc pipe slide.    Consulted and Agree with Plan of Care Patient             Patient will benefit from skilled therapeutic intervention in order to improve the following deficits and impairments:   Body Structure / Function / Physical Skills: ADL, Endurance, Muscle spasms, UE functional use, Fascial restriction, Pain, ROM, IADL, Strength       Visit Diagnosis: Other symptoms and signs involving the musculoskeletal system  Acute pain of left shoulder  Stiffness of left shoulder, not elsewhere classified    Problem List Patient Active Problem List   Diagnosis Date Noted   History of failed repair of rotator cuff 02/22/2021   S/P arthroscopy of left shoulder RCR 08/19/19 08/25/2019   Nontraumatic complete tear of left rotator cuff    S/P right knee arthroscopy 02/25/2019 03/02/2019   Complex tear of lateral meniscus of right knee as current injury    Status post rotator cuff repair 10/15/2018 12/21/2018   Biceps tendon  rupture, proximal, right, subsequent encounter    Primary osteoarthritis of right shoulder    Labral tear of shoulder, degenerative, right    Synovitis of right shoulder    Spondylolisthesis at L3-L4 level 12/10/2016   Elevated blood-pressure reading, without diagnosis of hypertension 11/06/2016   Bursitis, shoulder 02/24/2013   HYPERLIPIDEMIA 02/04/2007   WRIST SPRAIN, LEFT 02/04/2007   MIGRAINE HEADACHE 01/15/2007   BRONCHITIS, CHRONIC NOS 01/15/2007   CONSTIPATION 01/15/2007   IBS 01/15/2007   DISC DISEASE, LUMBOSACRAL SPINE 01/15/2007   LOW BACK PAIN, CHRONIC 01/15/2007   MALAISE AND FATIGUE 01/15/2007    Ailene Ravel, OTR/L,CBIS  [phone]  04/10/2021, 4:24 PM  [place], [place], [address] Phone: [phone]   Fax:  [phone]  Name: Kathryn Woods MRN: [id number] Date of Birth: 13-Mar-1951

## 2021-04-13 ENCOUNTER — Other Ambulatory Visit: Payer: Self-pay

## 2021-04-13 ENCOUNTER — Ambulatory Visit (HOSPITAL_COMMUNITY): Payer: Medicare Other | Admitting: Occupational Therapy

## 2021-04-13 ENCOUNTER — Encounter (HOSPITAL_COMMUNITY): Payer: Self-pay | Admitting: Occupational Therapy

## 2021-04-13 DIAGNOSIS — R29898 Other symptoms and signs involving the musculoskeletal system: Secondary | ICD-10-CM | POA: Diagnosis not present

## 2021-04-13 DIAGNOSIS — M25512 Pain in left shoulder: Secondary | ICD-10-CM

## 2021-04-13 DIAGNOSIS — M25612 Stiffness of left shoulder, not elsewhere classified: Secondary | ICD-10-CM

## 2021-04-13 NOTE — Therapy (Signed)
[place], [place], [address] Phone: [phone]   Fax:  [phone]  Occupational Therapy Treatment  Patient Details  Name: Kathryn Woods MRN: [id number] Date of Birth: 1950-07-31 Referring Provider (OT): Dr. Larena Glassman   Encounter Date: 04/13/2021   OT End of Session - 04/13/21 1515     Visit Number 10    Number of Visits 16    Date for OT Re-Evaluation 05/07/21    Authorization Type [employer] Medicare, $30 copay    Authorization Time Period no visit limit    Progress Note Due on Visit 19    OT Start Time 1430    OT Stop Time 1513    OT Time Calculation (min) 43 min    Activity Tolerance Patient tolerated treatment well    Behavior During Therapy [facility] for tasks assessed/performed             Past Medical History:  Diagnosis Date   Arthritis    lumbar region, spondylolisthesis    Hypercholesteremia     Past Surgical History:  Procedure Laterality Date   BACK SURGERY     KNEE ARTHROSCOPY WITH LATERAL MENISECTOMY Right 02/25/2019   Procedure: RIGHT KNEE ARTHROSCOPY WITH LATERAL MENISECTOMY;  Surgeon: Carole Civil, MD;  Location: [facility];  Service: Orthopedics;  Laterality: Right;   REVERSE SHOULDER ARTHROPLASTY Left 02/22/2021   Procedure: LEFT REVERSE SHOULDER ARTHROPLASTY;  Surgeon: Mordecai Rasmussen, MD;  Location: [facility];  Service: Orthopedics;  Laterality: Left;   SHOULDER ARTHROSCOPY WITH OPEN ROTATOR CUFF REPAIR Right 10/15/2018   Procedure: SHOULDER ARTHROSCOPY WITH OPEN ROTATOR CUFF REPAIR;  Surgeon: Carole Civil, MD;  Location: [facility];  Service: Orthopedics;  Laterality: Right;   SHOULDER ARTHROSCOPY WITH ROTATOR CUFF REPAIR Left 08/19/2019   Procedure: LEFT SHOULDER ARTHROSCOPY WITH ROTATOR CUFF REPAIR;  Surgeon: Carole Civil, MD;  Location: [facility];  Service: Orthopedics;  Laterality: Left;  With scalene block    TUBAL LIGATION      There were no vitals filed for this  visit.   Subjective Assessment - 04/13/21 1427     Subjective  S: It's been hurting for a week.    Currently in Pain? Yes    Pain Score 7     Pain Location Shoulder    Pain Orientation Left    Pain Descriptors / Indicators Aching;Constant    Pain Type Acute pain    Pain Radiating Towards N/A    Pain Onset 1 to 4 weeks ago    Pain Frequency Constant    Aggravating Factors  unknown cause    Pain Relieving Factors pain medication, heat    Effect of Pain on Daily Activities max effect on ADLs    Multiple Pain Sites No                [facility] OT Assessment - 04/13/21 1426       Assessment   Medical Diagnosis s/p left reverse TSA      Precautions   Precautions Shoulder    Type of Shoulder Precautions see protocol                      OT Treatments/Exercises (OP) - 04/13/21 1432       Exercises   Exercises Shoulder      Shoulder Exercises: Supine   Protraction PROM;5 reps;AAROM;12 reps    Horizontal ABduction PROM;5 reps;AAROM;12 reps    External Rotation PROM;5  reps;AAROM;12 reps    Internal Rotation PROM;5 reps;AAROM;12 reps    Flexion PROM;5 reps;AAROM;12 reps    ABduction PROM;5 reps;AAROM;10 reps      Modalities   Modalities Electrical Stimulation      Electrical Stimulation   Electrical Stimulation Location left shoulder    Electrical Stimulation Action interferential    Electrical Stimulation Parameters 13.0 CV    Electrical Stimulation Goals Pain      Manual Therapy   Manual Therapy Myofascial release    Manual therapy comments completed separately from therapeutic exercise    Myofascial Release myofascial release and manual techniques to left upper arm, anterior shoulder, trapezius, and scapular regions to decrease pain and fascial restrictions and increase joint ROM.                      OT Short Term Goals - 04/10/21 1507       OT SHORT TERM GOAL #1   Title Pt will be provided with and educated on HEP to improve ability  to use LUE as non-dominant during ADLs.    Time 4    Period Weeks    Status Achieved    Target Date 04/07/21      OT SHORT TERM GOAL #2   Title Pt will increase LUE P/ROM to [facility] to improve ability to perform dressing tasks with minimal compensatory strategies.    Time 4    Period Weeks    Status Achieved      OT SHORT TERM GOAL #3   Title Pt will increase LUE strength to 3/5 to improve ability to reach items at waist to chest height during ADL completion.    Time 4    Period Weeks    Status Partially Met               OT Long Term Goals - 03/09/21 1500       OT LONG TERM GOAL #1   Title Patient will return to highest level of independence while using her LUE for all daily and household tasks.    Time 8    Period Weeks    Status On-going      OT LONG TERM GOAL #2   Title Patient will increase LUE A/ROM to [facility] in order to reach into overhead cabinets with less difficulty.    Time 8    Period Weeks    Status On-going      OT LONG TERM GOAL #3   Title Patient will decrease fascial restrictions in LUE to min amount or less in order to increase functional mobilty needed to complete reaching and dressing tasks.    Time 8    Period Weeks    Status On-going      OT LONG TERM GOAL #4   Title Patient will increase her LUE strength to 4+/5 in order to return to completing her required yardwork tasks.    Time 8    Period Weeks    Status On-going      OT LONG TERM GOAL #5   Title Patient will report a decrease in pain of approximately 3/10 or less when completing daily and household chores using her LUE.    Time 8    Period Weeks    Status On-going                   Plan - 04/13/21 1448     Clinical Impression Statement A: Pt reports continued increased pain,  is completing HEP once per day for AA/ROM. Continued with myofascial release to address fascial restrictions and scar restrictions, passive stretching completed. Pt completing AA/ROM in supine. ES then  applied for pain management due to pt continued high pain level. Pt reports pain decreased to 4/10 after ES.    Body Structure / Function / Physical Skills ADL;Endurance;Muscle spasms;UE functional use;Fascial restriction;Pain;ROM;IADL;Strength    Plan P: Follow up pain after ES use. Resume pvc pipe slide, continue with protocol    OT Home Exercise Plan eval: table slides 12/20: AA/ROM except abduction (supine); 12/29: progress AA/ROM to standing    Consulted and Agree with Plan of Care Patient             Patient will benefit from skilled therapeutic intervention in order to improve the following deficits and impairments:   Body Structure / Function / Physical Skills: ADL, Endurance, Muscle spasms, UE functional use, Fascial restriction, Pain, ROM, IADL, Strength       Visit Diagnosis: Other symptoms and signs involving the musculoskeletal system  Acute pain of left shoulder  Stiffness of left shoulder, not elsewhere classified    Problem List Patient Active Problem List   Diagnosis Date Noted   History of failed repair of rotator cuff 02/22/2021   S/P arthroscopy of left shoulder RCR 08/19/19 08/25/2019   Nontraumatic complete tear of left rotator cuff    S/P right knee arthroscopy 02/25/2019 03/02/2019   Complex tear of lateral meniscus of right knee as current injury    Status post rotator cuff repair 10/15/2018 12/21/2018   Biceps tendon rupture, proximal, right, subsequent encounter    Primary osteoarthritis of right shoulder    Labral tear of shoulder, degenerative, right    Synovitis of right shoulder    Spondylolisthesis at L3-L4 level 12/10/2016   Elevated blood-pressure reading, without diagnosis of hypertension 11/06/2016   Bursitis, shoulder 02/24/2013   HYPERLIPIDEMIA 02/04/2007   WRIST SPRAIN, LEFT 02/04/2007   MIGRAINE HEADACHE 01/15/2007   BRONCHITIS, CHRONIC NOS 01/15/2007   CONSTIPATION 01/15/2007   IBS 01/15/2007   [place] DISEASE, LUMBOSACRAL SPINE  01/15/2007   LOW BACK PAIN, CHRONIC 01/15/2007   MALAISE AND FATIGUE 01/15/2007    Guadelupe Sabin, OTR/L  [phone] 04/13/2021, 3:16 PM  [facility] [address] [place], [place], [address] Phone: [phone]   Fax:  [phone]  Name: Kathryn Woods MRN: [id number] Date of Birth: 1950-07-09

## 2021-04-17 ENCOUNTER — Encounter (HOSPITAL_COMMUNITY): Payer: Self-pay

## 2021-04-17 ENCOUNTER — Other Ambulatory Visit: Payer: Self-pay

## 2021-04-17 ENCOUNTER — Ambulatory Visit (HOSPITAL_COMMUNITY): Payer: Medicare Other

## 2021-04-17 DIAGNOSIS — M25612 Stiffness of left shoulder, not elsewhere classified: Secondary | ICD-10-CM

## 2021-04-17 DIAGNOSIS — R29898 Other symptoms and signs involving the musculoskeletal system: Secondary | ICD-10-CM | POA: Diagnosis not present

## 2021-04-17 DIAGNOSIS — M25512 Pain in left shoulder: Secondary | ICD-10-CM

## 2021-04-17 NOTE — Therapy (Signed)
[place], [place], [address] Phone: [phone]   Fax:  [phone]  Occupational Therapy Treatment  Patient Details  Name: Kathryn Woods MRN: [id number] Date of Birth: [birth year] Referring Provider (OT): Dr. Larena Glassman   Encounter Date: 04/17/2021   OT End of Session - 04/17/21 1520     Visit Number 11    Number of Visits 16    Date for OT Re-Evaluation 05/07/21    Authorization Type [employer] Medicare, $30 copay    Authorization Time Period no visit limit    Progress Note Due on Visit 19    OT Start Time 1431    OT Stop Time 1510    OT Time Calculation (min) 39 min    Activity Tolerance Patient tolerated treatment well    Behavior During Therapy [facility] for tasks assessed/performed             Past Medical History:  Diagnosis Date   Arthritis    lumbar region, spondylolisthesis    Hypercholesteremia     Past Surgical History:  Procedure Laterality Date   BACK SURGERY     KNEE ARTHROSCOPY WITH LATERAL MENISECTOMY Right 02/25/2019   Procedure: RIGHT KNEE ARTHROSCOPY WITH LATERAL MENISECTOMY;  Surgeon: Carole Civil, MD;  Location: [facility];  Service: Orthopedics;  Laterality: Right;   REVERSE SHOULDER ARTHROPLASTY Left 02/22/2021   Procedure: LEFT REVERSE SHOULDER ARTHROPLASTY;  Surgeon: Mordecai Rasmussen, MD;  Location: [facility];  Service: Orthopedics;  Laterality: Left;   SHOULDER ARTHROSCOPY WITH OPEN ROTATOR CUFF REPAIR Right 10/15/2018   Procedure: SHOULDER ARTHROSCOPY WITH OPEN ROTATOR CUFF REPAIR;  Surgeon: Carole Civil, MD;  Location: [facility];  Service: Orthopedics;  Laterality: Right;   SHOULDER ARTHROSCOPY WITH ROTATOR CUFF REPAIR Left 08/19/2019   Procedure: LEFT SHOULDER ARTHROSCOPY WITH ROTATOR CUFF REPAIR;  Surgeon: Carole Civil, MD;  Location: [facility];  Service: Orthopedics;  Laterality: Left;  With scalene block    TUBAL LIGATION      There were no vitals filed for this  visit.   Subjective Assessment - 04/17/21 1434     Subjective  S: It's still sore but it's not as bad as it was.    Currently in Pain? Yes    Pain Score 5     Pain Location Shoulder    Pain Orientation Left    Pain Descriptors / Indicators Constant;Aching    Pain Type Acute pain    Pain Onset 1 to 4 weeks ago    Pain Frequency Constant    Aggravating Factors  unknown cause    Pain Relieving Factors pain medication, heat    Effect of Pain on Daily Activities max effect    Multiple Pain Sites No                [facility] OT Assessment - 04/17/21 1435       Assessment   Medical Diagnosis s/p left reverse TSA      Precautions   Precautions Shoulder    Type of Shoulder Precautions see protocol                      OT Treatments/Exercises (OP) - 04/17/21 1435       Exercises   Exercises Shoulder      Shoulder Exercises: Supine   Protraction PROM;5 reps;AROM;10 reps    Horizontal ABduction PROM;5 reps;AAROM;12 reps    External Rotation PROM;5 reps;AROM;10 reps  Internal Rotation PROM;5 reps;AROM;10 reps    Flexion PROM;5 reps;AROM;10 reps    ABduction PROM;5 reps;AAROM;12 reps      Shoulder Exercises: ROM/Strengthening   Wall Wash 1'    Proximal Shoulder Strengthening, Supine 10X each, no rest breaks      Modalities   Modalities Electrical Stimulation      Electrical Stimulation   Electrical Stimulation Location left shoulder    Electrical Stimulation Action interferential    Electrical Stimulation Parameters 13.0 CV    Electrical Stimulation Goals Pain      Manual Therapy   Manual Therapy Myofascial release    Manual therapy comments completed separately from therapeutic exercise    Myofascial Release myofascial release and manual techniques to left upper arm, anterior shoulder, trapezius, and scapular regions to decrease pain and fascial restrictions and increase joint ROM.                      OT Short Term Goals - 04/17/21 1523        OT SHORT TERM GOAL #1   Title Pt will be provided with and educated on HEP to improve ability to use LUE as non-dominant during ADLs.    Time 4    Period Weeks    Target Date 04/07/21      OT SHORT TERM GOAL #2   Title Pt will increase LUE P/ROM to [facility] to improve ability to perform dressing tasks with minimal compensatory strategies.    Time 4    Period Weeks      OT SHORT TERM GOAL #3   Title Pt will increase LUE strength to 3/5 to improve ability to reach items at waist to chest height during ADL completion.    Time 4    Period Weeks    Status Partially Met               OT Long Term Goals - 03/09/21 1500       OT LONG TERM GOAL #1   Title Patient will return to highest level of independence while using her LUE for all daily and household tasks.    Time 8    Period Weeks    Status On-going      OT LONG TERM GOAL #2   Title Patient will increase LUE A/ROM to [facility] in order to reach into overhead cabinets with less difficulty.    Time 8    Period Weeks    Status On-going      OT LONG TERM GOAL #3   Title Patient will decrease fascial restrictions in LUE to min amount or less in order to increase functional mobilty needed to complete reaching and dressing tasks.    Time 8    Period Weeks    Status On-going      OT LONG TERM GOAL #4   Title Patient will increase her LUE strength to 4+/5 in order to return to completing her required yardwork tasks.    Time 8    Period Weeks    Status On-going      OT LONG TERM GOAL #5   Title Patient will report a decrease in pain of approximately 3/10 or less when completing daily and household chores using her LUE.    Time 8    Period Weeks    Status On-going                   Plan - 04/17/21 1521  Clinical Impression Statement A: Continues to report an increased pain level with unknown cause. Pain level is lower that initially at a 5/10. Able to complete some A/ROM exercises supine with VC for form and  technique. Pain began to increase during wall wash activity. Ended session with ES and moist heat. Patient reports a pain level of 3/10 at end of session.    Body Structure / Function / Physical Skills ADL;Endurance;Muscle spasms;UE functional use;Fascial restriction;Pain;ROM;IADL;Strength    Plan P: Follow up on pain level. Resume PVC pipe. complete A/ROM supine with what is tolerated. Complete AA/ROM standing if pain allows.    Consulted and Agree with Plan of Care Patient             Patient will benefit from skilled therapeutic intervention in order to improve the following deficits and impairments:   Body Structure / Function / Physical Skills: ADL, Endurance, Muscle spasms, UE functional use, Fascial restriction, Pain, ROM, IADL, Strength       Visit Diagnosis: Other symptoms and signs involving the musculoskeletal system  Stiffness of left shoulder, not elsewhere classified  Acute pain of left shoulder    Problem List Patient Active Problem List   Diagnosis Date Noted   History of failed repair of rotator cuff 02/22/2021   S/P arthroscopy of left shoulder RCR 08/19/19 08/25/2019   Nontraumatic complete tear of left rotator cuff    S/P right knee arthroscopy 02/25/2019 03/02/2019   Complex tear of lateral meniscus of right knee as current injury    Status post rotator cuff repair 10/15/2018 12/21/2018   Biceps tendon rupture, proximal, right, subsequent encounter    Primary osteoarthritis of right shoulder    Labral tear of shoulder, degenerative, right    Synovitis of right shoulder    Spondylolisthesis at L3-L4 level 12/10/2016   Elevated blood-pressure reading, without diagnosis of hypertension 11/06/2016   Bursitis, shoulder 02/24/2013   HYPERLIPIDEMIA 02/04/2007   WRIST SPRAIN, LEFT 02/04/2007   MIGRAINE HEADACHE 01/15/2007   BRONCHITIS, CHRONIC NOS 01/15/2007   CONSTIPATION 01/15/2007   IBS 01/15/2007   DISC DISEASE, LUMBOSACRAL SPINE 01/15/2007   LOW BACK  PAIN, CHRONIC 01/15/2007   MALAISE AND FATIGUE 01/15/2007    Ailene Ravel, OTR/L,CBIS  [phone]  04/17/2021, 3:23 PM  [place], [place], [address] Phone: [phone]   Fax:  [phone]  Name: Kathryn Woods MRN: [id number] Date of Birth: [birth year]

## 2021-04-20 ENCOUNTER — Other Ambulatory Visit: Payer: Self-pay

## 2021-04-20 ENCOUNTER — Encounter (HOSPITAL_COMMUNITY): Payer: Self-pay | Admitting: Occupational Therapy

## 2021-04-20 ENCOUNTER — Encounter (HOSPITAL_COMMUNITY): Payer: Medicare Other | Admitting: Occupational Therapy

## 2021-04-20 ENCOUNTER — Ambulatory Visit (HOSPITAL_COMMUNITY): Payer: Medicare Other | Admitting: Occupational Therapy

## 2021-04-20 DIAGNOSIS — R29898 Other symptoms and signs involving the musculoskeletal system: Secondary | ICD-10-CM | POA: Diagnosis not present

## 2021-04-20 DIAGNOSIS — M25612 Stiffness of left shoulder, not elsewhere classified: Secondary | ICD-10-CM

## 2021-04-20 DIAGNOSIS — M25512 Pain in left shoulder: Secondary | ICD-10-CM

## 2021-04-20 NOTE — Therapy (Signed)
[place] [address] [place], [place], [address] Phone: [phone]   Fax:  [phone]  Occupational Therapy Treatment  Patient Details  Name: Kathryn Woods MRN: [id number] Date of Birth: 1951-02-11 Referring Provider (OT): Dr. Larena Glassman   Encounter Date: 04/20/2021   OT End of Session - 04/20/21 1505     Visit Number 12    Number of Visits 16    Date for OT Re-Evaluation 05/07/21    Authorization Type [employer] Medicare, $30 copay    Authorization Time Period no visit limit    Progress Note Due on Visit 19    OT Start Time 1433    OT Stop Time 1512    OT Time Calculation (min) 39 min    Activity Tolerance Patient tolerated treatment well    Behavior During Therapy [facility] for tasks assessed/performed             Past Medical History:  Diagnosis Date   Arthritis    lumbar region, spondylolisthesis    Hypercholesteremia     Past Surgical History:  Procedure Laterality Date   BACK SURGERY     KNEE ARTHROSCOPY WITH LATERAL MENISECTOMY Right 02/25/2019   Procedure: RIGHT KNEE ARTHROSCOPY WITH LATERAL MENISECTOMY;  Surgeon: Carole Civil, MD;  Location: [facility];  Service: Orthopedics;  Laterality: Right;   REVERSE SHOULDER ARTHROPLASTY Left 02/22/2021   Procedure: LEFT REVERSE SHOULDER ARTHROPLASTY;  Surgeon: Mordecai Rasmussen, MD;  Location: [facility];  Service: Orthopedics;  Laterality: Left;   SHOULDER ARTHROSCOPY WITH OPEN ROTATOR CUFF REPAIR Right 10/15/2018   Procedure: SHOULDER ARTHROSCOPY WITH OPEN ROTATOR CUFF REPAIR;  Surgeon: Carole Civil, MD;  Location: [facility];  Service: Orthopedics;  Laterality: Right;   SHOULDER ARTHROSCOPY WITH ROTATOR CUFF REPAIR Left 08/19/2019   Procedure: LEFT SHOULDER ARTHROSCOPY WITH ROTATOR CUFF REPAIR;  Surgeon: Carole Civil, MD;  Location: [facility];  Service: Orthopedics;  Laterality: Left;  With scalene block    TUBAL LIGATION      There were no vitals filed for this  visit.   Subjective Assessment - 04/20/21 1435     Subjective  S: It's not bad today.    Currently in Pain? Yes    Pain Score 3     Pain Location Shoulder    Pain Orientation Left    Pain Descriptors / Indicators Aching;Constant    Pain Type Acute pain    Pain Radiating Towards N/A    Pain Onset 1 to 4 weeks ago    Pain Frequency Constant    Aggravating Factors  unknown    Pain Relieving Factors pain medication, heat    Effect of Pain on Daily Activities mod to max effect on ADL    Multiple Pain Sites No                [facility] OT Assessment - 04/20/21 1433       Assessment   Medical Diagnosis s/p left reverse TSA      Precautions   Precautions Shoulder    Type of Shoulder Precautions see protocol                      OT Treatments/Exercises (OP) - 04/20/21 1436       Exercises   Exercises Shoulder      Shoulder Exercises: Supine   Protraction PROM;5 reps;AROM;10 reps    Horizontal ABduction PROM;5 reps;AROM;10 reps    External Rotation PROM;5 reps;AROM;10  reps    Internal Rotation PROM;5 reps;AROM;10 reps    Flexion PROM;5 reps;AROM;10 reps    ABduction PROM;5 reps;AROM;10 reps      Shoulder Exercises: Standing   Protraction AAROM;10 reps    Horizontal ABduction AAROM;10 reps    External Rotation AAROM;10 reps    Internal Rotation AAROM;10 reps    Flexion AAROM;10 reps    ABduction AAROM;10 reps      Shoulder Exercises: ROM/Strengthening   Wall Wash 1'    Proximal Shoulder Strengthening, Supine 10X each, no rest breaks    Other ROM/Strengthening Exercises PVC pipe slide: 10X flexion      Modalities   Modalities Electrical Stimulation      Electrical Stimulation   Electrical Stimulation Location left shoulder    Electrical Stimulation Action interferential    Electrical Stimulation Parameters 7.6 CV    Electrical Stimulation Goals Pain      Manual Therapy   Manual Therapy Myofascial release    Manual therapy comments completed  separately from therapeutic exercise    Myofascial Release myofascial release and manual techniques to left upper arm, anterior shoulder, trapezius, and scapular regions to decrease pain and fascial restrictions and increase joint ROM.                      OT Short Term Goals - 04/17/21 1523       OT SHORT TERM GOAL #1   Title Pt will be provided with and educated on HEP to improve ability to use LUE as non-dominant during ADLs.    Time 4    Period Weeks    Target Date 04/07/21      OT SHORT TERM GOAL #2   Title Pt will increase LUE P/ROM to [facility] to improve ability to perform dressing tasks with minimal compensatory strategies.    Time 4    Period Weeks      OT SHORT TERM GOAL #3   Title Pt will increase LUE strength to 3/5 to improve ability to reach items at waist to chest height during ADL completion.    Time 4    Period Weeks    Status Partially Met               OT Long Term Goals - 03/09/21 1500       OT LONG TERM GOAL #1   Title Patient will return to highest level of independence while using her LUE for all daily and household tasks.    Time 8    Period Weeks    Status On-going      OT LONG TERM GOAL #2   Title Patient will increase LUE A/ROM to [facility] in order to reach into overhead cabinets with less difficulty.    Time 8    Period Weeks    Status On-going      OT LONG TERM GOAL #3   Title Patient will decrease fascial restrictions in LUE to min amount or less in order to increase functional mobilty needed to complete reaching and dressing tasks.    Time 8    Period Weeks    Status On-going      OT LONG TERM GOAL #4   Title Patient will increase her LUE strength to 4+/5 in order to return to completing her required yardwork tasks.    Time 8    Period Weeks    Status On-going      OT LONG TERM GOAL #5   Title  Patient will report a decrease in pain of approximately 3/10 or less when completing daily and household chores using her LUE.     Time 8    Period Weeks    Status On-going                   Plan - 04/20/21 1504     Clinical Impression Statement A: Pt reports lower pain level today than previous session. Continued with myofascial release and passive stretching, completed all A/ROM in supine and AA/ROM in standing. Completed wall wash and pvc pipe slide. Pt with increasing pain with exercises, reporting at 5/10. ES completed at end of session for pain. Verbal cuing for form and technique during exercises. Pt reports pain back down to 2/10 after ES.    Body Structure / Function / Physical Skills ADL;Endurance;Muscle spasms;UE functional use;Fascial restriction;Pain;ROM;IADL;Strength    Plan P: Follow up on pain level. Resume scapular theraband    OT Home Exercise Plan eval: table slides 12/20: AA/ROM except abduction (supine); 12/29: progress AA/ROM to standing    Consulted and Agree with Plan of Care Patient             Patient will benefit from skilled therapeutic intervention in order to improve the following deficits and impairments:   Body Structure / Function / Physical Skills: ADL, Endurance, Muscle spasms, UE functional use, Fascial restriction, Pain, ROM, IADL, Strength       Visit Diagnosis: Other symptoms and signs involving the musculoskeletal system  Stiffness of left shoulder, not elsewhere classified  Acute pain of left shoulder    Problem List Patient Active Problem List   Diagnosis Date Noted   History of failed repair of rotator cuff 02/22/2021   S/P arthroscopy of left shoulder RCR 08/19/19 08/25/2019   Nontraumatic complete tear of left rotator cuff    S/P right knee arthroscopy 02/25/2019 03/02/2019   Complex tear of lateral meniscus of right knee as current injury    Status post rotator cuff repair 10/15/2018 12/21/2018   Biceps tendon rupture, proximal, right, subsequent encounter    Primary osteoarthritis of right shoulder    Labral tear of shoulder, degenerative, right     Synovitis of right shoulder    Spondylolisthesis at L3-L4 level 12/10/2016   Elevated blood-pressure reading, without diagnosis of hypertension 11/06/2016   Bursitis, shoulder 02/24/2013   HYPERLIPIDEMIA 02/04/2007   WRIST SPRAIN, LEFT 02/04/2007   MIGRAINE HEADACHE 01/15/2007   BRONCHITIS, CHRONIC NOS 01/15/2007   CONSTIPATION 01/15/2007   IBS 01/15/2007   DISC DISEASE, LUMBOSACRAL SPINE 01/15/2007   LOW BACK PAIN, CHRONIC 01/15/2007   MALAISE AND FATIGUE 01/15/2007    Guadelupe Sabin, OTR/L  [phone] 04/20/2021, 3:17 PM  [place], [place], [address] Phone: [phone]   Fax:  [phone]  Name: Kathryn Woods MRN: [id number] Date of Birth: [birth year]

## 2021-04-24 ENCOUNTER — Encounter (HOSPITAL_COMMUNITY): Payer: Self-pay

## 2021-04-24 ENCOUNTER — Other Ambulatory Visit: Payer: Self-pay

## 2021-04-24 ENCOUNTER — Ambulatory Visit (HOSPITAL_COMMUNITY): Payer: Medicare Other

## 2021-04-24 DIAGNOSIS — R29898 Other symptoms and signs involving the musculoskeletal system: Secondary | ICD-10-CM

## 2021-04-24 DIAGNOSIS — M25612 Stiffness of left shoulder, not elsewhere classified: Secondary | ICD-10-CM

## 2021-04-24 DIAGNOSIS — M25512 Pain in left shoulder: Secondary | ICD-10-CM

## 2021-04-24 NOTE — Patient Instructions (Signed)

## 2021-04-24 NOTE — Therapy (Signed)
[place], [place], [address] Phone: [phone]   Fax:  [phone]  Occupational Therapy Treatment  Patient Details  Name: Kathryn Woods MRN: [id number] Date of Birth: 1950/06/27 Referring Provider (OT): Dr. Larena Glassman   Encounter Date: 04/24/2021   OT End of Session - 04/24/21 1441     Visit Number 13    Number of Visits 16    Date for OT Re-Evaluation 05/07/21    Authorization Type [employer] Medicare, $30 copay    Authorization Time Period no visit limit    Progress Note Due on Visit 19    OT Start Time 1345    OT Stop Time 1423    OT Time Calculation (min) 38 min    Activity Tolerance Patient tolerated treatment well    Behavior During Therapy [facility] for tasks assessed/performed             Past Medical History:  Diagnosis Date   Arthritis    lumbar region, spondylolisthesis    Hypercholesteremia     Past Surgical History:  Procedure Laterality Date   BACK SURGERY     KNEE ARTHROSCOPY WITH LATERAL MENISECTOMY Right 02/25/2019   Procedure: RIGHT KNEE ARTHROSCOPY WITH LATERAL MENISECTOMY;  Surgeon: Carole Civil, MD;  Location: [facility];  Service: Orthopedics;  Laterality: Right;   REVERSE SHOULDER ARTHROPLASTY Left 02/22/2021   Procedure: LEFT REVERSE SHOULDER ARTHROPLASTY;  Surgeon: Mordecai Rasmussen, MD;  Location: [facility];  Service: Orthopedics;  Laterality: Left;   SHOULDER ARTHROSCOPY WITH OPEN ROTATOR CUFF REPAIR Right 10/15/2018   Procedure: SHOULDER ARTHROSCOPY WITH OPEN ROTATOR CUFF REPAIR;  Surgeon: Carole Civil, MD;  Location: [facility];  Service: Orthopedics;  Laterality: Right;   SHOULDER ARTHROSCOPY WITH ROTATOR CUFF REPAIR Left 08/19/2019   Procedure: LEFT SHOULDER ARTHROSCOPY WITH ROTATOR CUFF REPAIR;  Surgeon: Carole Civil, MD;  Location: [facility];  Service: Orthopedics;  Laterality: Left;  With scalene block    TUBAL LIGATION      There were no vitals filed for this  visit.   Subjective Assessment - 04/24/21 1350     Subjective  S: It's still sore. I try not to do much with it.    Currently in Pain? Yes    Pain Score 4     Pain Location Shoulder    Pain Orientation Left    Pain Descriptors / Indicators Aching    Pain Type Acute pain    Pain Onset 1 to 4 weeks ago    Pain Frequency Intermittent    Aggravating Factors  with increased activity    Pain Relieving Factors heat, TENS unit at clinic, OTC pain medication PRN.    Effect of Pain on Daily Activities max effect    Multiple Pain Sites No                [facility] OT Assessment - 04/24/21 1351       Assessment   Medical Diagnosis s/p left reverse TSA      Precautions   Precautions Shoulder    Type of Shoulder Precautions see protocol                      OT Treatments/Exercises (OP) - 04/24/21 1351       Exercises   Exercises Shoulder      Shoulder Exercises: Supine   Protraction PROM;5 reps    Horizontal ABduction PROM;5 reps;AROM;10 reps    External  Rotation PROM;5 reps;AROM;10 reps    Internal Rotation PROM;5 reps;AROM;10 reps    Flexion PROM;5 reps;AROM;10 reps    ABduction PROM;5 reps      Shoulder Exercises: Standing   Extension Theraband;12 reps    Theraband Level (Shoulder Extension) Level 2 (Red)    Row Theraband;12 reps    Theraband Level (Shoulder Row) Level 2 (Red)      Shoulder Exercises: ROM/Strengthening   UBE (Upper Arm Bike) Level 1 2' forward 2' reverse   pace: 5.0   Proximal Shoulder Strengthening, Supine 10X each, no rest breaks      Manual Therapy   Manual Therapy Myofascial release    Manual therapy comments completed separately from therapeutic exercise    Myofascial Release myofascial release and manual techniques to left upper arm, anterior shoulder, trapezius, and scapular regions to decrease pain and fascial restrictions and increase joint ROM.                    OT Education - 04/24/21 1428     Education Details  A/ROM provided to begin at home. Recommended standing if possible.    Person(s) Educated Patient    Methods Explanation;Handout    Comprehension Verbalized understanding              OT Short Term Goals - 04/17/21 1523       OT SHORT TERM GOAL #1   Title Pt will be provided with and educated on HEP to improve ability to use LUE as non-dominant during ADLs.    Time 4    Period Weeks    Target Date 04/07/21      OT SHORT TERM GOAL #2   Title Pt will increase LUE P/ROM to [facility] to improve ability to perform dressing tasks with minimal compensatory strategies.    Time 4    Period Weeks      OT SHORT TERM GOAL #3   Title Pt will increase LUE strength to 3/5 to improve ability to reach items at waist to chest height during ADL completion.    Time 4    Period Weeks    Status Partially Met               OT Long Term Goals - 03/09/21 1500       OT LONG TERM GOAL #1   Title Patient will return to highest level of independence while using her LUE for all daily and household tasks.    Time 8    Period Weeks    Status On-going      OT LONG TERM GOAL #2   Title Patient will increase LUE A/ROM to [facility] in order to reach into overhead cabinets with less difficulty.    Time 8    Period Weeks    Status On-going      OT LONG TERM GOAL #3   Title Patient will decrease fascial restrictions in LUE to min amount or less in order to increase functional mobilty needed to complete reaching and dressing tasks.    Time 8    Period Weeks    Status On-going      OT LONG TERM GOAL #4   Title Patient will increase her LUE strength to 4+/5 in order to return to completing her required yardwork tasks.    Time 8    Period Weeks    Status On-going      OT LONG TERM GOAL #5   Title Patient will report a  decrease in pain of approximately 3/10 or less when completing daily and household chores using her LUE.    Time 8    Period Weeks    Status On-going                   Plan -  04/24/21 1442     Clinical Impression Statement A: Pt reports continued pain in the left arm and refrains from using it at home. Completed myofascial release to the left arm to decrease fascial restrictions. pain located in the posterior shoulder region with increased tenderness. Completed A/ROM exercises while providing VC for form and technique. Encouraged patient to try to complete A/ROM exercises either supine or standing for HEP with print out provided.    Body Structure / Function / Physical Skills ADL;Endurance;Muscle spasms;UE functional use;Fascial restriction;Pain;ROM;IADL;Strength    Plan P: A/ROM standing. Follow up on pain level since no ES or heat was used at last session.    OT Home Exercise Plan eval: table slides 12/20: AA/ROM except abduction (supine); 12/29: progress AA/ROM to standing 1/17: A/ROM    Consulted and Agree with Plan of Care Patient             Patient will benefit from skilled therapeutic intervention in order to improve the following deficits and impairments:   Body Structure / Function / Physical Skills: ADL, Endurance, Muscle spasms, UE functional use, Fascial restriction, Pain, ROM, IADL, Strength       Visit Diagnosis: Other symptoms and signs involving the musculoskeletal system  Stiffness of left shoulder, not elsewhere classified  Acute pain of left shoulder    Problem List Patient Active Problem List   Diagnosis Date Noted   History of failed repair of rotator cuff 02/22/2021   S/P arthroscopy of left shoulder RCR 08/19/19 08/25/2019   Nontraumatic complete tear of left rotator cuff    S/P right knee arthroscopy 02/25/2019 03/02/2019   Complex tear of lateral meniscus of right knee as current injury    Status post rotator cuff repair 10/15/2018 12/21/2018   Biceps tendon rupture, proximal, right, subsequent encounter    Primary osteoarthritis of right shoulder    Labral tear of shoulder, degenerative, right    Synovitis of right shoulder     Spondylolisthesis at L3-L4 level 12/10/2016   Elevated blood-pressure reading, without diagnosis of hypertension 11/06/2016   Bursitis, shoulder 02/24/2013   HYPERLIPIDEMIA 02/04/2007   WRIST SPRAIN, LEFT 02/04/2007   MIGRAINE HEADACHE 01/15/2007   BRONCHITIS, CHRONIC NOS 01/15/2007   CONSTIPATION 01/15/2007   IBS 01/15/2007   [place] DISEASE, LUMBOSACRAL SPINE 01/15/2007   LOW BACK PAIN, CHRONIC 01/15/2007   MALAISE AND FATIGUE 01/15/2007    Ailene Ravel, OTR/L,CBIS  [phone]  04/24/2021, 2:45 PM  [facility] [address] [place], [place], [address] Phone: [phone]   Fax:  [phone]  Name: Geovana Gebel MRN: [id number] Date of Birth: April 01, 1951

## 2021-04-25 ENCOUNTER — Telehealth: Payer: Self-pay | Admitting: Radiology

## 2021-04-25 MED ORDER — HYDROCODONE-ACETAMINOPHEN 5-325 MG PO TABS: 1.0000 | ORAL_TABLET | Freq: Three times a day (TID) | ORAL | 0 refills | Status: DC | PRN

## 2021-04-25 NOTE — Addendum Note (Signed)
Addended by: Thane Edu A on: 04/25/2021 09:52 AM   Modules accepted: Orders

## 2021-04-25 NOTE — Telephone Encounter (Signed)
Patient called and requested refill of pain medication, Kathryn Woods.

## 2021-04-27 ENCOUNTER — Other Ambulatory Visit: Payer: Self-pay

## 2021-04-27 ENCOUNTER — Encounter (HOSPITAL_COMMUNITY): Payer: Self-pay | Admitting: Occupational Therapy

## 2021-04-27 ENCOUNTER — Ambulatory Visit (HOSPITAL_COMMUNITY): Payer: Medicare Other | Admitting: Occupational Therapy

## 2021-04-27 DIAGNOSIS — R29898 Other symptoms and signs involving the musculoskeletal system: Secondary | ICD-10-CM

## 2021-04-27 DIAGNOSIS — M25512 Pain in left shoulder: Secondary | ICD-10-CM

## 2021-04-27 DIAGNOSIS — M25612 Stiffness of left shoulder, not elsewhere classified: Secondary | ICD-10-CM

## 2021-04-27 NOTE — Therapy (Signed)
[place], [place], [address] Phone: [phone]   Fax:  [phone]  Occupational Therapy Treatment  Patient Details  Name: Kathryn Woods MRN: [id number] Date of Birth: 1950/06/09 Referring Provider (OT): Dr. Larena Glassman   Encounter Date: 04/27/2021   OT End of Session - 04/27/21 1511     Visit Number 14    Number of Visits 16    Date for OT Re-Evaluation 05/07/21    Authorization Type [employer] Medicare, $30 copay    Authorization Time Period no visit limit    Progress Note Due on Visit 19    OT Start Time 1432    OT Stop Time 1510    OT Time Calculation (min) 38 min    Activity Tolerance Patient tolerated treatment well    Behavior During Therapy [facility] for tasks assessed/performed             Past Medical History:  Diagnosis Date   Arthritis    lumbar region, spondylolisthesis    Hypercholesteremia     Past Surgical History:  Procedure Laterality Date   BACK SURGERY     KNEE ARTHROSCOPY WITH LATERAL MENISECTOMY Right 02/25/2019   Procedure: RIGHT KNEE ARTHROSCOPY WITH LATERAL MENISECTOMY;  Surgeon: Carole Civil, MD;  Location: [facility];  Service: Orthopedics;  Laterality: Right;   REVERSE SHOULDER ARTHROPLASTY Left 02/22/2021   Procedure: LEFT REVERSE SHOULDER ARTHROPLASTY;  Surgeon: Mordecai Rasmussen, MD;  Location: [facility];  Service: Orthopedics;  Laterality: Left;   SHOULDER ARTHROSCOPY WITH OPEN ROTATOR CUFF REPAIR Right 10/15/2018   Procedure: SHOULDER ARTHROSCOPY WITH OPEN ROTATOR CUFF REPAIR;  Surgeon: Carole Civil, MD;  Location: [facility];  Service: Orthopedics;  Laterality: Right;   SHOULDER ARTHROSCOPY WITH ROTATOR CUFF REPAIR Left 08/19/2019   Procedure: LEFT SHOULDER ARTHROSCOPY WITH ROTATOR CUFF REPAIR;  Surgeon: Carole Civil, MD;  Location: [facility];  Service: Orthopedics;  Laterality: Left;  With scalene block    TUBAL LIGATION      There were no vitals filed for this  visit.   Subjective Assessment - 04/27/21 1433     Subjective  S: I do a little cleaning but I don't use it too much.    Currently in Pain? Yes    Pain Score 5     Pain Location Shoulder    Pain Orientation Left    Pain Descriptors / Indicators Aching    Pain Type Acute pain    Pain Radiating Towards N/A    Pain Onset 1 to 4 weeks ago    Pain Frequency Intermittent    Aggravating Factors  with increased activity    Pain Relieving Factors heat, TENS unit at clinic, OTC pain medication    Effect of Pain on Daily Activities max effect on ADLs    Multiple Pain Sites No                [facility] OT Assessment - 04/27/21 1433       Assessment   Medical Diagnosis s/p left reverse TSA      Precautions   Precautions Shoulder    Type of Shoulder Precautions see protocol                      OT Treatments/Exercises (OP) - 04/27/21 1434       Exercises   Exercises Shoulder      Shoulder Exercises: Supine   Protraction PROM;5 reps;AROM;12 reps  Horizontal ABduction PROM;5 reps;AROM;12 reps    External Rotation PROM;5 reps;AROM;12 reps    Internal Rotation PROM;5 reps;AROM;12 reps    Flexion PROM;5 reps;AROM;12 reps    ABduction PROM;5 reps;AROM;12 reps      Shoulder Exercises: Standing   Protraction AROM;10 reps    Horizontal ABduction AROM;10 reps    External Rotation AROM;10 reps    Internal Rotation AROM;10 reps    Flexion AROM;10 reps    ABduction AROM;10 reps      Shoulder Exercises: ROM/Strengthening   Proximal Shoulder Strengthening, Supine 10X each, no rest breaks      Modalities   Modalities Electrical Stimulation      Electrical Stimulation   Electrical Stimulation Location left shoulder    Electrical Stimulation Action interferential    Electrical Stimulation Parameters 9.0 CV    Electrical Stimulation Goals Pain      Manual Therapy   Manual Therapy Myofascial release    Manual therapy comments completed separately from therapeutic  exercise    Myofascial Release myofascial release and manual techniques to left upper arm, anterior shoulder, trapezius, and scapular regions to decrease pain and fascial restrictions and increase joint ROM.                      OT Short Term Goals - 04/17/21 1523       OT SHORT TERM GOAL #1   Title Pt will be provided with and educated on HEP to improve ability to use LUE as non-dominant during ADLs.    Time 4    Period Weeks    Target Date 04/07/21      OT SHORT TERM GOAL #2   Title Pt will increase LUE P/ROM to [facility] to improve ability to perform dressing tasks with minimal compensatory strategies.    Time 4    Period Weeks      OT SHORT TERM GOAL #3   Title Pt will increase LUE strength to 3/5 to improve ability to reach items at waist to chest height during ADL completion.    Time 4    Period Weeks    Status Partially Met               OT Long Term Goals - 03/09/21 1500       OT LONG TERM GOAL #1   Title Patient will return to highest level of independence while using her LUE for all daily and household tasks.    Time 8    Period Weeks    Status On-going      OT LONG TERM GOAL #2   Title Patient will increase LUE A/ROM to [facility] in order to reach into overhead cabinets with less difficulty.    Time 8    Period Weeks    Status On-going      OT LONG TERM GOAL #3   Title Patient will decrease fascial restrictions in LUE to min amount or less in order to increase functional mobilty needed to complete reaching and dressing tasks.    Time 8    Period Weeks    Status On-going      OT LONG TERM GOAL #4   Title Patient will increase her LUE strength to 4+/5 in order to return to completing her required yardwork tasks.    Time 8    Period Weeks    Status On-going      OT LONG TERM GOAL #5   Title Patient will report a decrease  in pain of approximately 3/10 or less when completing daily and household chores using her LUE.    Time 8    Period Weeks     Status On-going                   Plan - 04/27/21 1454     Clinical Impression Statement A: Pt reports she forgot to take pain medication before coming so her pain is greater today. Continued with myofascial release to address fascial restrictions, passive stretching. Completed A/ROM in supine and standing, proximal shoulder strengthening. Verbal cuing for form and technique. Pt reporting increasing pain levels with A/ROM, applied ES at end of session for pain management. Reports pain down to 4/10 after ES.    Body Structure / Function / Physical Skills ADL;Endurance;Muscle spasms;UE functional use;Fascial restriction;Pain;ROM;IADL;Strength    Plan P: Complete A/ROM standing, no supine, resume theraband, ES if needed    OT Home Exercise Plan eval: table slides 12/20: AA/ROM except abduction (supine); 12/29: progress AA/ROM to standing 1/17: A/ROM             Patient will benefit from skilled therapeutic intervention in order to improve the following deficits and impairments:   Body Structure / Function / Physical Skills: ADL, Endurance, Muscle spasms, UE functional use, Fascial restriction, Pain, ROM, IADL, Strength       Visit Diagnosis: Other symptoms and signs involving the musculoskeletal system  Stiffness of left shoulder, not elsewhere classified  Acute pain of left shoulder    Problem List Patient Active Problem List   Diagnosis Date Noted   History of failed repair of rotator cuff 02/22/2021   S/P arthroscopy of left shoulder RCR 08/19/19 08/25/2019   Nontraumatic complete tear of left rotator cuff    S/P right knee arthroscopy 02/25/2019 03/02/2019   Complex tear of lateral meniscus of right knee as current injury    Status post rotator cuff repair 10/15/2018 12/21/2018   Biceps tendon rupture, proximal, right, subsequent encounter    Primary osteoarthritis of right shoulder    Labral tear of shoulder, degenerative, right    Synovitis of right shoulder     Spondylolisthesis at L3-L4 level 12/10/2016   Elevated blood-pressure reading, without diagnosis of hypertension 11/06/2016   Bursitis, shoulder 02/24/2013   HYPERLIPIDEMIA 02/04/2007   WRIST SPRAIN, LEFT 02/04/2007   MIGRAINE HEADACHE 01/15/2007   BRONCHITIS, CHRONIC NOS 01/15/2007   CONSTIPATION 01/15/2007   IBS 01/15/2007   [place] DISEASE, LUMBOSACRAL SPINE 01/15/2007   LOW BACK PAIN, CHRONIC 01/15/2007   MALAISE AND FATIGUE 01/15/2007    Guadelupe Sabin, OTR/L  [phone] 04/27/2021, 3:12 PM  [facility] [address] [place], [place], [address] Phone: [phone]   Fax:  [phone]  Name: Kathryn Woods MRN: [id number] Date of Birth: [birth year]

## 2021-05-01 ENCOUNTER — Ambulatory Visit (HOSPITAL_COMMUNITY): Payer: Medicare Other

## 2021-05-04 ENCOUNTER — Other Ambulatory Visit: Payer: Self-pay

## 2021-05-04 ENCOUNTER — Ambulatory Visit (HOSPITAL_COMMUNITY): Payer: Medicare Other | Admitting: Occupational Therapy

## 2021-05-04 ENCOUNTER — Encounter (HOSPITAL_COMMUNITY): Payer: Self-pay | Admitting: Occupational Therapy

## 2021-05-04 DIAGNOSIS — M25512 Pain in left shoulder: Secondary | ICD-10-CM

## 2021-05-04 DIAGNOSIS — R29898 Other symptoms and signs involving the musculoskeletal system: Secondary | ICD-10-CM

## 2021-05-04 DIAGNOSIS — M25612 Stiffness of left shoulder, not elsewhere classified: Secondary | ICD-10-CM

## 2021-05-04 NOTE — Therapy (Signed)
[place], [place], [address] Phone: [phone]   Fax:  [phone]  Occupational Therapy Reassessment, Treatment Recertification  Patient Details  Name: Kathryn Woods MRN: [id number] Date of Birth: 1950-07-11 Referring Provider (OT): Dr. Larena Glassman   Encounter Date: 05/04/2021   OT End of Session - 05/04/21 1710     Visit Number 15    Number of Visits 23    Date for OT Re-Evaluation 06/03/21    Authorization Type [employer] Medicare, $30 copay    Authorization Time Period no visit limit    Progress Note Due on Visit 19    OT Start Time 1439    OT Stop Time 1517    OT Time Calculation (min) 38 min    Activity Tolerance Patient tolerated treatment well    Behavior During Therapy [facility] for tasks assessed/performed             Past Medical History:  Diagnosis Date   Arthritis    lumbar region, spondylolisthesis    Hypercholesteremia     Past Surgical History:  Procedure Laterality Date   BACK SURGERY     KNEE ARTHROSCOPY WITH LATERAL MENISECTOMY Right 02/25/2019   Procedure: RIGHT KNEE ARTHROSCOPY WITH LATERAL MENISECTOMY;  Surgeon: Carole Civil, MD;  Location: [facility];  Service: Orthopedics;  Laterality: Right;   REVERSE SHOULDER ARTHROPLASTY Left 02/22/2021   Procedure: LEFT REVERSE SHOULDER ARTHROPLASTY;  Surgeon: Mordecai Rasmussen, MD;  Location: [facility];  Service: Orthopedics;  Laterality: Left;   SHOULDER ARTHROSCOPY WITH OPEN ROTATOR CUFF REPAIR Right 10/15/2018   Procedure: SHOULDER ARTHROSCOPY WITH OPEN ROTATOR CUFF REPAIR;  Surgeon: Carole Civil, MD;  Location: [facility];  Service: Orthopedics;  Laterality: Right;   SHOULDER ARTHROSCOPY WITH ROTATOR CUFF REPAIR Left 08/19/2019   Procedure: LEFT SHOULDER ARTHROSCOPY WITH ROTATOR CUFF REPAIR;  Surgeon: Carole Civil, MD;  Location: [facility];  Service: Orthopedics;  Laterality: Left;  With scalene block    TUBAL LIGATION      There  were no vitals filed for this visit.   Subjective Assessment - 05/04/21 1459     Subjective  S: I can reach my cabinets better.    Currently in Pain? Yes    Pain Score 3     Pain Location Shoulder    Pain Orientation Left    Pain Descriptors / Indicators Aching;Sore    Pain Type Acute pain    Pain Radiating Towards N/A    Pain Onset More than a month ago    Pain Frequency Intermittent    Aggravating Factors  increased activity    Pain Relieving Factors heat, OTC pain medication    Effect of Pain on Daily Activities max effect on ADLs    Multiple Pain Sites No                [facility] OT Assessment - 05/04/21 1441       Assessment   Medical Diagnosis s/p left reverse TSA      Precautions   Precautions Shoulder    Type of Shoulder Precautions see protocol      Observation/Other Assessments   Focus on Therapeutic Outcomes (FOTO)  60/100   54/100 previous     AROM   Overall AROM Comments assessed seated, er/IR adducted    AROM Assessment Site Shoulder    Right/Left Shoulder Left    Left Shoulder Flexion 118 Degrees   70 previous   Left Shoulder  ABduction 126 Degrees   51 previous   Left Shoulder Internal Rotation 90 Degrees   same as previous   Left Shoulder External Rotation 57 Degrees   50 previous     PROM   Overall PROM Comments Assessed seated, er/IR abducted    PROM Assessment Site Shoulder    Right/Left Shoulder Left    Left Shoulder Flexion 153 Degrees   same as previous   Left Shoulder ABduction 156 Degrees   135 previous   Left Shoulder Internal Rotation 75 Degrees   70 previous   Left Shoulder External Rotation 70 Degrees   60 previous     Strength   Overall Strength Comments Assessed IR/er Adducted    Strength Assessment Site Shoulder    Right/Left Shoulder Left    Left Shoulder Flexion 3/5   3-/5 previous   Left Shoulder ABduction 3/5   3-/5 previous   Left Shoulder Internal Rotation 5/5   4-/5 previous   Left Shoulder External Rotation 4-/5   same  as previous                     OT Treatments/Exercises (OP) - 05/04/21 0001       Shoulder Exercises: Supine   Protraction PROM;5 reps    Horizontal ABduction PROM;5 reps    External Rotation PROM;5 reps    Internal Rotation PROM;5 reps    Flexion PROM;5 reps    ABduction PROM;5 reps      Shoulder Exercises: Standing   Protraction AROM;10 reps    Horizontal ABduction AROM;10 reps    External Rotation AROM;10 reps    Internal Rotation AROM;10 reps    Flexion AROM;10 reps    ABduction AROM;10 reps    Extension Theraband;12 reps    Theraband Level (Shoulder Extension) Level 2 (Red)    Row Theraband;12 reps    Theraband Level (Shoulder Row) Level 2 (Red)      Shoulder Exercises: ROM/Strengthening   X to V Arms 10X A/ROM    Proximal Shoulder Strengthening, Seated 10X each, no rest breaks      Shoulder Exercises: Stretch   Cross Chest Stretch 2 reps;10 seconds    Internal Rotation Stretch 2 reps   10" horizontal towel   Wall Stretch - Flexion 2 reps;10 seconds    Wall Stretch - ABduction 2 reps;10 seconds      Functional Reaching Activities   High Level Pt removing items from middle shelf of cabinet, moving items from bottom shelf to middle shelf, and placing remaining items on bottom shelf.                      OT Short Term Goals - 05/04/21 1453       OT SHORT TERM GOAL #1   Title Pt will be provided with and educated on HEP to improve ability to use LUE as non-dominant during ADLs.    Time 4    Period Weeks    Target Date 04/07/21      OT SHORT TERM GOAL #2   Title Pt will increase LUE P/ROM to [facility] to improve ability to perform dressing tasks with minimal compensatory strategies.    Time 4    Period Weeks      OT SHORT TERM GOAL #3   Title Pt will increase LUE strength to 3/5 to improve ability to reach items at waist to chest height during ADL completion.    Time 4  Period Weeks    Status Achieved               OT Long Term  Goals - 05/04/21 1454       OT LONG TERM GOAL #1   Title Patient will return to highest level of independence while using her LUE for all daily and household tasks.    Time 8    Period Weeks    Status On-going      OT LONG TERM GOAL #2   Title Patient will increase LUE A/ROM to [facility] in order to reach into overhead cabinets with less difficulty.    Time 8    Period Weeks    Status On-going      OT LONG TERM GOAL #3   Title Patient will decrease fascial restrictions in LUE to min amount or less in order to increase functional mobilty needed to complete reaching and dressing tasks.    Time 8    Period Weeks    Status Achieved      OT LONG TERM GOAL #4   Title Patient will increase her LUE strength to 4+/5 in order to return to completing her required yardwork tasks.    Time 8    Period Weeks    Status On-going      OT LONG TERM GOAL #5   Title Patient will report a decrease in pain of approximately 3/10 or less when completing daily and household chores using her LUE.    Time 8    Period Weeks    Status On-going                   Plan - 05/04/21 1500     Clinical Impression Statement A: Reassessment completed this session, pt has met all STGs and 1/5 LTGs thus far. Pt reports her reaching is getting better, she still cannot sweep or do heavier housework. Pt demonstrates improvement in A/ROM, fascial restrictions, and pain is lower today than in recent visits. Pt continues to have significantly limited strength required for sustained use and completion of ADL tasks. Continued with passive stretching and A/ROM today, added shoulder stretches and functional reaching tasks. Attempted retraction with theraband however pt unable to complete with appropriate form. Verbal cuing for form and technique.    OT Occupational Profile and History Problem Focused Assessment - Including review of records relating to presenting problem    Occupational performance deficits (Please refer to  evaluation for details): ADL's;IADL's;Rest and Sleep;Leisure    Body Structure / Function / Physical Skills ADL;Endurance;Muscle spasms;UE functional use;Fascial restriction;Pain;ROM;IADL;Strength    Rehab Potential Good    Clinical Decision Making Limited treatment options, no task modification necessary    Comorbidities Affecting Occupational Performance: None    Modification or Assistance to Complete Evaluation  No modification of tasks or assist necessary to complete eval    OT Frequency 2x / week    OT Duration 4 weeks    OT Treatment/Interventions Self-care/ADL training;Ultrasound;Patient/family education;Passive range of motion;Cryotherapy;Electrical Stimulation;Moist Heat;Therapeutic exercise;Manual Therapy;Therapeutic activities    Plan P: Pt will benefit from continued skilled OT services to decrease pain and fascial restrictions and increase ROM, strength, and functional use of LUE. Next session: continue with shoulder stretches and add to HEP, functional reaching with squigz on doorway, overhead lacing    OT Home Exercise Plan eval: table slides 12/20: AA/ROM except abduction (supine); 12/29: progress AA/ROM to standing 1/17: A/ROM    Consulted and Agree with Plan of Care Patient  Patient will benefit from skilled therapeutic intervention in order to improve the following deficits and impairments:   Body Structure / Function / Physical Skills: ADL, Endurance, Muscle spasms, UE functional use, Fascial restriction, Pain, ROM, IADL, Strength       Visit Diagnosis: Other symptoms and signs involving the musculoskeletal system  Stiffness of left shoulder, not elsewhere classified  Acute pain of left shoulder    Problem List Patient Active Problem List   Diagnosis Date Noted   History of failed repair of rotator cuff 02/22/2021   S/P arthroscopy of left shoulder RCR 08/19/19 08/25/2019   Nontraumatic complete tear of left rotator cuff    S/P right knee  arthroscopy 02/25/2019 03/02/2019   Complex tear of lateral meniscus of right knee as current injury    Status post rotator cuff repair 10/15/2018 12/21/2018   Biceps tendon rupture, proximal, right, subsequent encounter    Primary osteoarthritis of right shoulder    Labral tear of shoulder, degenerative, right    Synovitis of right shoulder    Spondylolisthesis at L3-L4 level 12/10/2016   Elevated blood-pressure reading, without diagnosis of hypertension 11/06/2016   Bursitis, shoulder 02/24/2013   HYPERLIPIDEMIA 02/04/2007   WRIST SPRAIN, LEFT 02/04/2007   MIGRAINE HEADACHE 01/15/2007   BRONCHITIS, CHRONIC NOS 01/15/2007   CONSTIPATION 01/15/2007   IBS 01/15/2007   [place] DISEASE, LUMBOSACRAL SPINE 01/15/2007   LOW BACK PAIN, CHRONIC 01/15/2007   MALAISE AND FATIGUE 01/15/2007    Guadelupe Sabin, OTR/L  [phone] 05/04/2021, 5:13 PM  [place] [address] [place], [place], [address] Phone: [phone]   Fax:  [phone]  Name: Kathryn Woods MRN: [id number] Date of Birth: 1950/12/12

## 2021-05-09 ENCOUNTER — Encounter (HOSPITAL_COMMUNITY): Payer: Self-pay

## 2021-05-09 ENCOUNTER — Ambulatory Visit (HOSPITAL_COMMUNITY): Payer: Medicare Other | Attending: Orthopedic Surgery

## 2021-05-09 ENCOUNTER — Other Ambulatory Visit: Payer: Self-pay

## 2021-05-09 DIAGNOSIS — R29898 Other symptoms and signs involving the musculoskeletal system: Secondary | ICD-10-CM | POA: Diagnosis present

## 2021-05-09 DIAGNOSIS — M25612 Stiffness of left shoulder, not elsewhere classified: Secondary | ICD-10-CM | POA: Diagnosis present

## 2021-05-09 DIAGNOSIS — M25512 Pain in left shoulder: Secondary | ICD-10-CM | POA: Insufficient documentation

## 2021-05-09 NOTE — Patient Instructions (Signed)
Complete the following exercises 2-3 times a day.  Doorway Stretch  Place each hand opposite each other on the doorway. (You can change where you feel the stretch by moving arms higher or lower.) Step through with one foot and bend front knee until a stretch is felt and hold. Step through with the opposite foot on the next rep. Hold for __20-30___ seconds. Repeat __2__times.         Internal Rotation Across Back  Grab the end of a towel with your affected side, palm facing backwards. Grab the towel with your unaffected side and pull your affected hand across your back until you feel a stretch in the front of your shoulder. If you feel pain, pull just to the pain, do not pull through the pain. Hold. Return your affected arm to your side. Try to keep your hand/arm close to your body during the entire movement.     Hold for 20-30 seconds. Complete 2 times.        Posterior Capsule Stretch   Stand or sit, one arm across body so hand rests over opposite shoulder. Gently push on crossed elbow with other hand until stretch is felt in shoulder of crossed arm. Hold _20-30__ seconds.  Repeat _2__ times per session.   Wall Flexion  Slide your arm up the wall or door frame until a stretch is felt in your shoulder . Hold for 20-30 seconds. Complete 2 times     Shoulder Abduction Stretch  Stand side ways by a wall with affected up on wall. Gently step in toward wall to feel stretch. Hold for 20-30 seconds. Complete 2 times.

## 2021-05-09 NOTE — Therapy (Signed)
[place], [place], [address] Phone: [phone]   Fax:  [phone]  Occupational Therapy Treatment  Patient Details  Name: Kathryn Woods MRN: [id number] Date of Birth: Feb 18, 1951 Referring Provider (OT): Dr. Larena Glassman   Encounter Date: 05/09/2021   OT End of Session - 05/09/21 1509     Visit Number 16    Number of Visits 23    Date for OT Re-Evaluation 06/03/21    Authorization Type [employer] Medicare, $30 copay    Authorization Time Period no visit limit    Progress Note Due on Visit 19    OT Start Time 1430    OT Stop Time 1508    OT Time Calculation (min) 38 min    Activity Tolerance Patient tolerated treatment well    Behavior During Therapy [facility] for tasks assessed/performed             Past Medical History:  Diagnosis Date   Arthritis    lumbar region, spondylolisthesis    Hypercholesteremia     Past Surgical History:  Procedure Laterality Date   BACK SURGERY     KNEE ARTHROSCOPY WITH LATERAL MENISECTOMY Right 02/25/2019   Procedure: RIGHT KNEE ARTHROSCOPY WITH LATERAL MENISECTOMY;  Surgeon: Carole Civil, MD;  Location: [facility];  Service: Orthopedics;  Laterality: Right;   REVERSE SHOULDER ARTHROPLASTY Left 02/22/2021   Procedure: LEFT REVERSE SHOULDER ARTHROPLASTY;  Surgeon: Mordecai Rasmussen, MD;  Location: [facility];  Service: Orthopedics;  Laterality: Left;   SHOULDER ARTHROSCOPY WITH OPEN ROTATOR CUFF REPAIR Right 10/15/2018   Procedure: SHOULDER ARTHROSCOPY WITH OPEN ROTATOR CUFF REPAIR;  Surgeon: Carole Civil, MD;  Location: [facility];  Service: Orthopedics;  Laterality: Right;   SHOULDER ARTHROSCOPY WITH ROTATOR CUFF REPAIR Left 08/19/2019   Procedure: LEFT SHOULDER ARTHROSCOPY WITH ROTATOR CUFF REPAIR;  Surgeon: Carole Civil, MD;  Location: [facility];  Service: Orthopedics;  Laterality: Left;  With scalene block    TUBAL LIGATION      There were no vitals filed for this  visit.   Subjective Assessment - 05/09/21 1459     Currently in Pain? Yes    Pain Score 4     Pain Location Shoulder    Pain Orientation Left    Pain Descriptors / Indicators Aching;Sore    Pain Type Acute pain    Pain Onset More than a month ago    Pain Frequency Intermittent    Aggravating Factors  increased activity, weather    Pain Relieving Factors heat, OTC pain medication    Effect of Pain on Daily Activities max effect                          OT Treatments/Exercises (OP) - 05/09/21 1500       Exercises   Exercises Shoulder      Shoulder Exercises: Supine   Protraction PROM;5 reps    Horizontal ABduction PROM;5 reps    External Rotation PROM;5 reps    Internal Rotation PROM;5 reps    Flexion PROM;5 reps    ABduction PROM;5 reps      Shoulder Exercises: ROM/Strengthening   UBE (Upper Arm Bike) Level 1 2' forward 2' reverse pace: 5.0-5.5    Over Head Lace seated. Laced chain starting at top and working towards the bottom. Unlaced as well.      Shoulder Exercises: Stretch   Cross Chest Stretch 2 reps;20 seconds  Functional Reaching Activities   High Level Squigz placed and removed on highest level of door while standing      Manual Therapy   Manual Therapy Myofascial release    Manual therapy comments completed separately from therapeutic exercise    Myofascial Release myofascial release and manual techniques to left upper arm, anterior shoulder, trapezius, and scapular regions to decrease pain and fascial restrictions and increase joint ROM.                    OT Education - 05/09/21 1458     Education Details Shoulder stretches    Person(s) Educated Patient    Methods Explanation;Demonstration;Verbal cues;Handout    Comprehension Verbalized understanding;Returned demonstration              OT Short Term Goals - 05/09/21 1603       OT SHORT TERM GOAL #1   Title Pt will be provided with and educated on HEP to improve  ability to use LUE as non-dominant during ADLs.    Time 4    Period Weeks    Target Date 04/07/21      OT SHORT TERM GOAL #2   Title Pt will increase LUE P/ROM to [facility] to improve ability to perform dressing tasks with minimal compensatory strategies.    Time 4    Period Weeks      OT SHORT TERM GOAL #3   Title Pt will increase LUE strength to 3/5 to improve ability to reach items at waist to chest height during ADL completion.    Time 4    Period Weeks               OT Long Term Goals - 05/09/21 1603       OT LONG TERM GOAL #1   Title Patient will return to highest level of independence while using her LUE for all daily and household tasks.    Time 8    Period Weeks    Status On-going      OT LONG TERM GOAL #2   Title Patient will increase LUE A/ROM to [facility] in order to reach into overhead cabinets with less difficulty.    Time 8    Period Weeks    Status On-going      OT LONG TERM GOAL #3   Title Patient will decrease fascial restrictions in LUE to min amount or less in order to increase functional mobilty needed to complete reaching and dressing tasks.    Time 8    Period Weeks      OT LONG TERM GOAL #4   Title Patient will increase her LUE strength to 4+/5 in order to return to completing her required yardwork tasks.    Time 8    Period Weeks    Status On-going      OT LONG TERM GOAL #5   Title Patient will report a decrease in pain of approximately 3/10 or less when completing daily and household chores using her LUE.    Time 8    Period Weeks    Status On-going                   Plan - 05/09/21 1510     Clinical Impression Statement A: Myofascial release completed to address minimal fascial restrictions along the anterior shoulder region. Pain noted at end stretch and experienced also during prolonged reaching and self stretching. Completed shoulder stretches and provided to HEP. VC for  form and technique were provided. Muscle fatigue noted as  session progressed and patient was provided with additional cueing to take rest breaks when needed.    Body Structure / Function / Physical Skills ADL;Endurance;Muscle spasms;UE functional use;Fascial restriction;Pain;ROM;IADL;Strength    Plan P: If pain continues to be elevated use TENS and heat prior to exercises. Continue with functional reaching tasks and A/ROM    OT Home Exercise Plan eval: table slides 12/20: AA/ROM except abduction (supine); 12/29: progress AA/ROM to standing 1/17: A/ROM 2/1: shoulder stretches    Consulted and Agree with Plan of Care Patient             Patient will benefit from skilled therapeutic intervention in order to improve the following deficits and impairments:   Body Structure / Function / Physical Skills: ADL, Endurance, Muscle spasms, UE functional use, Fascial restriction, Pain, ROM, IADL, Strength       Visit Diagnosis: Other symptoms and signs involving the musculoskeletal system  Stiffness of left shoulder, not elsewhere classified  Acute pain of left shoulder    Problem List Patient Active Problem List   Diagnosis Date Noted   History of failed repair of rotator cuff 02/22/2021   S/P arthroscopy of left shoulder RCR 08/19/19 08/25/2019   Nontraumatic complete tear of left rotator cuff    S/P right knee arthroscopy 02/25/2019 03/02/2019   Complex tear of lateral meniscus of right knee as current injury    Status post rotator cuff repair 10/15/2018 12/21/2018   Biceps tendon rupture, proximal, right, subsequent encounter    Primary osteoarthritis of right shoulder    Labral tear of shoulder, degenerative, right    Synovitis of right shoulder    Spondylolisthesis at L3-L4 level 12/10/2016   Elevated blood-pressure reading, without diagnosis of hypertension 11/06/2016   Bursitis, shoulder 02/24/2013   HYPERLIPIDEMIA 02/04/2007   WRIST SPRAIN, LEFT 02/04/2007   MIGRAINE HEADACHE 01/15/2007   BRONCHITIS, CHRONIC NOS 01/15/2007    CONSTIPATION 01/15/2007   IBS 01/15/2007   DISC DISEASE, LUMBOSACRAL SPINE 01/15/2007   LOW BACK PAIN, CHRONIC 01/15/2007   MALAISE AND FATIGUE 01/15/2007    Ailene Ravel, OTR/L,CBIS  [phone]  05/09/2021, 4:03 PM  [facility] [place], [place], [address] Phone: [phone]   Fax:  [phone]  Name: Kathryn Woods MRN: [id number] Date of Birth: [birth year]

## 2021-05-11 ENCOUNTER — Encounter (HOSPITAL_COMMUNITY): Payer: Self-pay

## 2021-05-11 ENCOUNTER — Other Ambulatory Visit: Payer: Self-pay

## 2021-05-11 ENCOUNTER — Ambulatory Visit (HOSPITAL_COMMUNITY): Payer: Medicare Other

## 2021-05-11 DIAGNOSIS — R29898 Other symptoms and signs involving the musculoskeletal system: Secondary | ICD-10-CM | POA: Diagnosis not present

## 2021-05-11 DIAGNOSIS — M25612 Stiffness of left shoulder, not elsewhere classified: Secondary | ICD-10-CM

## 2021-05-11 DIAGNOSIS — M25512 Pain in left shoulder: Secondary | ICD-10-CM

## 2021-05-11 NOTE — Therapy (Signed)
[facility] [address] [place], [place], [address] Phone: [phone]   Fax:  [phone]  Occupational Therapy Treatment  Patient Details  Name: Kathryn Woods MRN: [id number] Date of Birth: 16-Apr-1950 Referring Provider (OT): Dr. Thane Edu   Encounter Date: 05/11/2021   OT End of Session - 05/11/21 1053     Visit Number 17    Number of Visits 23    Date for OT Re-Evaluation 06/03/21    Authorization Type UHC Medicare, $30 copay    Authorization Time Period no visit limit    Progress Note Due on Visit 19    OT Start Time 1035    OT Stop Time 1113    OT Time Calculation (min) 38 min    Activity Tolerance Patient tolerated treatment well    Behavior During Therapy [facility] for tasks assessed/performed             Past Medical History:  Diagnosis Date   Arthritis    lumbar region, spondylolisthesis    Hypercholesteremia     Past Surgical History:  Procedure Laterality Date   BACK SURGERY     KNEE ARTHROSCOPY WITH LATERAL MENISECTOMY Right 02/25/2019   Procedure: RIGHT KNEE ARTHROSCOPY WITH LATERAL MENISECTOMY;  Surgeon: Vickki Hearing, MD;  Location: [facility];  Service: Orthopedics;  Laterality: Right;   REVERSE SHOULDER ARTHROPLASTY Left 02/22/2021   Procedure: LEFT REVERSE SHOULDER ARTHROPLASTY;  Surgeon: Oliver Barre, MD;  Location: [facility];  Service: Orthopedics;  Laterality: Left;   SHOULDER ARTHROSCOPY WITH OPEN ROTATOR CUFF REPAIR Right 10/15/2018   Procedure: SHOULDER ARTHROSCOPY WITH OPEN ROTATOR CUFF REPAIR;  Surgeon: Vickki Hearing, MD;  Location: [facility];  Service: Orthopedics;  Laterality: Right;   SHOULDER ARTHROSCOPY WITH ROTATOR CUFF REPAIR Left 08/19/2019   Procedure: LEFT SHOULDER ARTHROSCOPY WITH ROTATOR CUFF REPAIR;  Surgeon: Vickki Hearing, MD;  Location: [facility];  Service: Orthopedics;  Laterality: Left;  With scalene block    TUBAL LIGATION      There were no vitals filed for this  visit.   Subjective Assessment - 05/11/21 1050     Subjective  S: It's not as sore as it was the other day.    Currently in Pain? Yes    Pain Score 3     Pain Location Shoulder    Pain Orientation Left    Pain Descriptors / Indicators Aching    Pain Type Acute pain    Pain Onset More than a month ago    Pain Frequency Intermittent    Aggravating Factors  increased activity, weather    Pain Relieving Factors heat, OTC pain medication    Effect of Pain on Daily Activities max effect    Multiple Pain Sites No                [facility] OT Assessment - 05/11/21 1112       Assessment   Medical Diagnosis s/p left reverse TSA      Precautions   Precautions Shoulder    Type of Shoulder Precautions see protocol                      OT Treatments/Exercises (OP) - 05/11/21 1051       Exercises   Exercises Shoulder      Shoulder Exercises: Supine   Protraction PROM;5 reps    Horizontal ABduction PROM;5 reps    External Rotation PROM;5 reps    Internal  Rotation PROM;5 reps    Flexion PROM;5 reps    ABduction PROM;5 reps      Shoulder Exercises: Standing   Protraction AROM;10 reps    Horizontal ABduction AROM;10 reps    External Rotation AROM;10 reps    Internal Rotation AROM;10 reps    Flexion AROM;10 reps    ABduction AROM;10 reps      Shoulder Exercises: ROM/Strengthening   UBE (Upper Arm Bike) Level 1 2' forward 2' reverse pace: 6.0    Proximal Shoulder Strengthening, Seated 10X each, no rest breaks      Functional Reaching Activities   High Level Squigz placed and removed on highest level of door while standing      Manual Therapy   Manual Therapy Myofascial release    Manual therapy comments completed separately from therapeutic exercise    Myofascial Release myofascial release and manual techniques to left upper arm, anterior shoulder, trapezius, and scapular regions to decrease pain and fascial restrictions and increase joint ROM.                       OT Short Term Goals - 05/09/21 1603       OT SHORT TERM GOAL #1   Title Pt will be provided with and educated on HEP to improve ability to use LUE as non-dominant during ADLs.    Time 4    Period Weeks    Target Date 04/07/21      OT SHORT TERM GOAL #2   Title Pt will increase LUE P/ROM to [facility] to improve ability to perform dressing tasks with minimal compensatory strategies.    Time 4    Period Weeks      OT SHORT TERM GOAL #3   Title Pt will increase LUE strength to 3/5 to improve ability to reach items at waist to chest height during ADL completion.    Time 4    Period Weeks               OT Long Term Goals - 05/09/21 1603       OT LONG TERM GOAL #1   Title Patient will return to highest level of independence while using her LUE for all daily and household tasks.    Time 8    Period Weeks    Status On-going      OT LONG TERM GOAL #2   Title Patient will increase LUE A/ROM to [facility] in order to reach into overhead cabinets with less difficulty.    Time 8    Period Weeks    Status On-going      OT LONG TERM GOAL #3   Title Patient will decrease fascial restrictions in LUE to min amount or less in order to increase functional mobilty needed to complete reaching and dressing tasks.    Time 8    Period Weeks      OT LONG TERM GOAL #4   Title Patient will increase her LUE strength to 4+/5 in order to return to completing her required yardwork tasks.    Time 8    Period Weeks    Status On-going      OT LONG TERM GOAL #5   Title Patient will report a decrease in pain of approximately 3/10 or less when completing daily and household chores using her LUE.    Time 8    Period Weeks    Status On-going  Plan - 05/11/21 1113     Clinical Impression Statement A: Patient has a follow up appointment with Dr. Dallas Schimkeairns next week (wednesday). Measurements from visit on 1/27 will be routed to him. Completed myofascial release to  address fascial restrictions in the left upper arm and upper trapezius. Continued to focus on functional reaching and A/ROM. VC for form and technique were provided. patient demonstrated muscle fatigue and soreness towards end of session.    Body Structure / Function / Physical Skills ADL;Endurance;Muscle spasms;UE functional use;Fascial restriction;Pain;ROM;IADL;Strength    Plan P: Continue with functional reaching. Progress to phase III and attempt light weight strengthening, starting supine and then standing if able to tolerate.    Consulted and Agree with Plan of Care Patient             Patient will benefit from skilled therapeutic intervention in order to improve the following deficits and impairments:   Body Structure / Function / Physical Skills: ADL, Endurance, Muscle spasms, UE functional use, Fascial restriction, Pain, ROM, IADL, Strength       Visit Diagnosis: Other symptoms and signs involving the musculoskeletal system  Acute pain of left shoulder  Stiffness of left shoulder, not elsewhere classified    Problem List Patient Active Problem List   Diagnosis Date Noted   History of failed repair of rotator cuff 02/22/2021   S/P arthroscopy of left shoulder RCR 08/19/19 08/25/2019   Nontraumatic complete tear of left rotator cuff    S/P right knee arthroscopy 02/25/2019 03/02/2019   Complex tear of lateral meniscus of right knee as current injury    Status post rotator cuff repair 10/15/2018 12/21/2018   Biceps tendon rupture, proximal, right, subsequent encounter    Primary osteoarthritis of right shoulder    Labral tear of shoulder, degenerative, right    Synovitis of right shoulder    Spondylolisthesis at L3-L4 level 12/10/2016   Elevated blood-pressure reading, without diagnosis of hypertension 11/06/2016   Bursitis, shoulder 02/24/2013   HYPERLIPIDEMIA 02/04/2007   WRIST SPRAIN, LEFT 02/04/2007   MIGRAINE HEADACHE 01/15/2007   BRONCHITIS, CHRONIC NOS  01/15/2007   CONSTIPATION 01/15/2007   IBS 01/15/2007   DISC DISEASE, LUMBOSACRAL SPINE 01/15/2007   LOW BACK PAIN, CHRONIC 01/15/2007   MALAISE AND FATIGUE 01/15/2007    Limmie PatriciaLaura Tyrone Pautsch, OTR/L,CBIS  [phone]  05/11/2021, 11:16 AM   [facility] [address] [place], [place], [address] Phone: [phone]   Fax:  [phone]  Name: Kathryn Woods MRN: [id number] Date of Birth: 07/28/1950

## 2021-05-16 ENCOUNTER — Encounter: Payer: Self-pay | Admitting: Orthopedic Surgery

## 2021-05-16 ENCOUNTER — Other Ambulatory Visit: Payer: Self-pay

## 2021-05-16 ENCOUNTER — Ambulatory Visit (INDEPENDENT_AMBULATORY_CARE_PROVIDER_SITE_OTHER): Payer: Medicare Other | Admitting: Orthopedic Surgery

## 2021-05-16 ENCOUNTER — Ambulatory Visit: Payer: Medicare Other

## 2021-05-16 ENCOUNTER — Ambulatory Visit (HOSPITAL_COMMUNITY): Payer: Medicare Other | Admitting: Occupational Therapy

## 2021-05-16 ENCOUNTER — Encounter (HOSPITAL_COMMUNITY): Payer: Self-pay | Admitting: Occupational Therapy

## 2021-05-16 DIAGNOSIS — M25612 Stiffness of left shoulder, not elsewhere classified: Secondary | ICD-10-CM

## 2021-05-16 DIAGNOSIS — Z96612 Presence of left artificial shoulder joint: Secondary | ICD-10-CM

## 2021-05-16 DIAGNOSIS — R29898 Other symptoms and signs involving the musculoskeletal system: Secondary | ICD-10-CM | POA: Diagnosis not present

## 2021-05-16 DIAGNOSIS — M25512 Pain in left shoulder: Secondary | ICD-10-CM

## 2021-05-16 NOTE — Progress Notes (Signed)
Orthopaedic Postop Note  Assessment: Kathryn Woods is a 71 y.o. female s/p Left Reverse Shoulder Arthroplasty, in setting of failed rotator cuff repair  DOS: 02/22/2021  Plan: She is doing very well following her surgery.  She has excellent range of motion.  Her strength is improving.  She states she continues to have pain, especially when she is doing her exercises.  I have urged her to continue working diligently, and take Tylenol and ibuprofen as needed.  I would not be providing her any additional narcotics following surgery.  We will plan to see her back in 3 months, and anticipate that we will be able to release her to full activities at that time.   Follow-up: Return in about 3 months (around 08/13/2021).  XR at next visit: Left shoulder  Subjective:  Chief Complaint  Patient presents with   Routine Post Op    S/p reverse total replacement LT shoulder DOS 02/22/21    History of Present Illness: Kathryn Woods is a 71 y.o. female who presents following the above stated procedure.  Surgery was approximately 3 months ago.  She continues to improve following surgery.  She states she has pain, especially with activities.  Her range of motion is almost full.  She notes some weakness, as demonstrated by the physical therapist.  No numbness and tingling in the left upper extremity.     Review of Systems: No fevers or chills No numbness or tingling No Chest Pain No shortness of breath   Objective: There were no vitals taken for this visit.  Physical Exam:  Alert and oriented.  No acute distress.  Surgical incision is healing well.  No surrounding erythema or drainage. Active forward flexion to 160 degrees.  Abduction to 100 degrees.  Internal rotation to T12.  External rotation at her side to 40 degrees.  Sensation is intact throughout the left arm.  Fingers are warm and well-perfused.  2+ radial pulse.  IMAGING: I personally ordered and reviewed the  following images:  X-rays of the left shoulder were obtained in clinic today.  These were compared to prior x-rays.  There is no evidence of subsidence of the implants.  The shoulder joint remains reduced.  No acute injuries are noted.  Impression: Left reverse shoulder arthroplasty in stable position, without prosthesis subsidence  Mordecai Rasmussen, MD 05/16/2021 9:50 AM

## 2021-05-17 NOTE — Therapy (Signed)
[facility] [address] [place], [place], [address] Phone: [phone]   Fax:  [phone]  Occupational Therapy Treatment  Patient Details  Name: Kathryn Woods MRN: [id number] Date of Birth: May 28, 1950 Referring Provider (OT): Dr. Larena Glassman   Encounter Date: 05/16/2021   OT End of Session - 05/17/21 0946     Visit Number 18    Number of Visits 23    Date for OT Re-Evaluation 06/03/21    Authorization Type UHC Medicare, $30 copay    Authorization Time Period no visit limit    Progress Note Due on Visit 19    OT Start Time 1432    OT Stop Time 1512    OT Time Calculation (min) 40 min    Activity Tolerance Patient tolerated treatment well    Behavior During Therapy [facility] for tasks assessed/performed             Past Medical History:  Diagnosis Date   Arthritis    lumbar region, spondylolisthesis    Hypercholesteremia     Past Surgical History:  Procedure Laterality Date   BACK SURGERY     KNEE ARTHROSCOPY WITH LATERAL MENISECTOMY Right 02/25/2019   Procedure: RIGHT KNEE ARTHROSCOPY WITH LATERAL MENISECTOMY;  Surgeon: Carole Civil, MD;  Location: [facility];  Service: Orthopedics;  Laterality: Right;   REVERSE SHOULDER ARTHROPLASTY Left 02/22/2021   Procedure: LEFT REVERSE SHOULDER ARTHROPLASTY;  Surgeon: Mordecai Rasmussen, MD;  Location: [facility];  Service: Orthopedics;  Laterality: Left;   SHOULDER ARTHROSCOPY WITH OPEN ROTATOR CUFF REPAIR Right 10/15/2018   Procedure: SHOULDER ARTHROSCOPY WITH OPEN ROTATOR CUFF REPAIR;  Surgeon: Carole Civil, MD;  Location: [facility];  Service: Orthopedics;  Laterality: Right;   SHOULDER ARTHROSCOPY WITH ROTATOR CUFF REPAIR Left 08/19/2019   Procedure: LEFT SHOULDER ARTHROSCOPY WITH ROTATOR CUFF REPAIR;  Surgeon: Carole Civil, MD;  Location: [facility];  Service: Orthopedics;  Laterality: Left;  With scalene block    TUBAL LIGATION      There were no vitals filed for this  visit.   Subjective Assessment - 05/16/21 1430     Subjective  S: Hurt like crazy yesterday.    Currently in Pain? Yes    Pain Score 3     Pain Location Shoulder    Pain Orientation Left    Pain Descriptors / Indicators Aching    Pain Type Acute pain    Pain Onset More than a month ago    Pain Frequency Intermittent    Aggravating Factors  increased activity, weather    Pain Relieving Factors heat, OTC pain meds    Effect of Pain on Daily Activities minimal to moderate    Multiple Pain Sites No                [facility] OT Assessment - 05/17/21 0001       Assessment   Medical Diagnosis s/p left reverse TSA      Precautions   Precautions Shoulder    Type of Shoulder Precautions see protocol                      OT Treatments/Exercises (OP) - 05/17/21 0001       Exercises   Exercises Shoulder      Shoulder Exercises: Supine   Protraction PROM;5 reps;Strengthening;Weights    Protraction Weight (lbs) 1#    Horizontal ABduction PROM;5 reps;Strengthening;10 reps    Horizontal ABduction Weight (lbs) 1#  External Rotation PROM;5 reps;Strengthening;10 reps    External Rotation Weight (lbs) 1#    Internal Rotation PROM;5 reps;Strengthening;10 reps    Internal Rotation Weight (lbs) 1#    Flexion PROM;5 reps;Strengthening;10 reps    Shoulder Flexion Weight (lbs) 1#    ABduction PROM;5 reps;Strengthening;10 reps    Shoulder ABduction Weight (lbs) 1#      Shoulder Exercises: Standing   Protraction Strengthening;10 reps    Protraction Weight (lbs) 1#    Horizontal ABduction Strengthening;10 reps    Horizontal ABduction Weight (lbs) 1#    External Rotation Strengthening;10 reps    External Rotation Weight (lbs) 1#    Internal Rotation Strengthening;10 reps    Internal Rotation Weight (lbs) 1#    Flexion Strengthening;10 reps    Shoulder Flexion Weight (lbs) 1#    ABduction Strengthening;5 reps    Shoulder ABduction Weight (lbs) 1#    ABduction  Limitations Limited to ~75% available range. Limited to x5 reps due to fatigue and difficulty.      Shoulder Exercises: ROM/Strengthening   UBE (Upper Arm Bike) Level 2; 3' forward 3' reverse pace: 6.0 to 7.0 pace.    Over Head Lace seated. Laced chain starting at top and working towards the bottom. Unlaced as well.    Other ROM/Strengthening Exercises Squigz placed and removed with x2 reps, once working on shoulder flexion and then working on shoulder abduction. Pt donned 1/2# cuff weight during all reps. Pt using leaning on the door to assit with range to reach as high as possible.      Manual Therapy   Manual Therapy Myofascial release    Manual therapy comments completed separately from therapeutic exercise    Myofascial Release myofascial release and manual techniques to left upper arm, anterior shoulder, trapezius, and scapular regions to decrease pain and fascial restrictions and increase joint ROM.                    OT Education - 05/17/21 0946     Education Details Strengthening with water botttle.    Person(s) Educated Patient    Methods Explanation;Demonstration;Verbal cues;Handout    Comprehension Verbalized understanding;Returned demonstration              OT Short Term Goals - 05/09/21 1603       OT SHORT TERM GOAL #1   Title Pt will be provided with and educated on HEP to improve ability to use LUE as non-dominant during ADLs.    Time 4    Period Weeks    Target Date 04/07/21      OT SHORT TERM GOAL #2   Title Pt will increase LUE P/ROM to [facility] to improve ability to perform dressing tasks with minimal compensatory strategies.    Time 4    Period Weeks      OT SHORT TERM GOAL #3   Title Pt will increase LUE strength to 3/5 to improve ability to reach items at waist to chest height during ADL completion.    Time 4    Period Weeks               OT Long Term Goals - 05/09/21 1603       OT LONG TERM GOAL #1   Title Patient will return to  highest level of independence while using her LUE for all daily and household tasks.    Time 8    Period Weeks    Status On-going  OT LONG TERM GOAL #2   Title Patient will increase LUE A/ROM to [facility] in order to reach into overhead cabinets with less difficulty.    Time 8    Period Weeks    Status On-going      OT LONG TERM GOAL #3   Title Patient will decrease fascial restrictions in LUE to min amount or less in order to increase functional mobilty needed to complete reaching and dressing tasks.    Time 8    Period Weeks      OT LONG TERM GOAL #4   Title Patient will increase her LUE strength to 4+/5 in order to return to completing her required yardwork tasks.    Time 8    Period Weeks    Status On-going      OT LONG TERM GOAL #5   Title Patient will report a decrease in pain of approximately 3/10 or less when completing daily and household chores using her LUE.    Time 8    Period Weeks    Status On-going                   Plan - 05/17/21 0947     Clinical Impression Statement A: Pt increased to strengthening with 1# dumbbell which pt did well in supine but presented with difficulty in standing with flexion slightly and abduction more so with grading of task from 10 to 5 reps with pt not reaching full range of motion. Pt able to complete overhead reaching task well with lacing chain. Pt completed squigz overhead reaching working on flexion and abduction donning 1/2# cuff weight. Verbal and tactile cues needed throughout session.    Body Structure / Function / Physical Skills ADL;Endurance;Muscle spasms;UE functional use;Fascial restriction;Pain;ROM;IADL;Strength    Plan P: Possibly reassess. Plan states needing reassess by 19th visit, but date of reassess is not till 2/26. Continue to progress pt in strengthening. Provide more formal hadnout on dumbbell strengthening.    OT Home Exercise Plan eval: table slides 12/20: AA/ROM except abduction (supine); 12/29: progress  AA/ROM to standing 1/17: A/ROM 2/1: shoulder stretches; strengthening with water bottle weight    Consulted and Agree with Plan of Care Patient             Patient will benefit from skilled therapeutic intervention in order to improve the following deficits and impairments:   Body Structure / Function / Physical Skills: ADL, Endurance, Muscle spasms, UE functional use, Fascial restriction, Pain, ROM, IADL, Strength       Visit Diagnosis: Other symptoms and signs involving the musculoskeletal system  Acute pain of left shoulder  Stiffness of left shoulder, not elsewhere classified    Problem List Patient Active Problem List   Diagnosis Date Noted   History of failed repair of rotator cuff 02/22/2021   S/P arthroscopy of left shoulder RCR 08/19/19 08/25/2019   Nontraumatic complete tear of left rotator cuff    S/P right knee arthroscopy 02/25/2019 03/02/2019   Complex tear of lateral meniscus of right knee as current injury    Status post rotator cuff repair 10/15/2018 12/21/2018   Biceps tendon rupture, proximal, right, subsequent encounter    Primary osteoarthritis of right shoulder    Labral tear of shoulder, degenerative, right    Synovitis of right shoulder    Spondylolisthesis at L3-L4 level 12/10/2016   Elevated blood-pressure reading, without diagnosis of hypertension 11/06/2016   Bursitis, shoulder 02/24/2013   HYPERLIPIDEMIA 02/04/2007   WRIST SPRAIN, LEFT  02/04/2007   MIGRAINE HEADACHE 01/15/2007   BRONCHITIS, CHRONIC NOS 01/15/2007   CONSTIPATION 01/15/2007   IBS 01/15/2007   DISC DISEASE, LUMBOSACRAL SPINE 01/15/2007   LOW BACK PAIN, CHRONIC 01/15/2007   MALAISE AND FATIGUE 01/15/2007   Larey Seat OT, MOT  Larey Seat, OT 05/17/2021, 9:54 AM  [facility] [place], [place], [address] Phone: [phone]   Fax:  [phone]  Name: Kaidan Anelli MRN: [id number] Date  of Birth: 1950/07/30

## 2021-05-17 NOTE — Patient Instructions (Signed)
Hand writing document given due to lack of Internet access. Handout educated pt to add holding a water bottle to A/ROM exercises except for abduction due to limitations in range of motion. Pt educated to remove water if bottle was too heavy.

## 2021-05-18 ENCOUNTER — Telehealth (HOSPITAL_COMMUNITY): Payer: Self-pay | Admitting: Occupational Therapy

## 2021-05-18 ENCOUNTER — Encounter (HOSPITAL_COMMUNITY): Payer: Medicare Other | Admitting: Occupational Therapy

## 2021-05-18 NOTE — Telephone Encounter (Signed)
Pt l/m she is still out of town and will not be back in town for this apptment

## 2021-05-22 ENCOUNTER — Other Ambulatory Visit: Payer: Self-pay | Admitting: Orthopedic Surgery

## 2021-05-22 NOTE — Telephone Encounter (Signed)
Patient called requesting refill for her pain medicine.     HYDROcodone-acetaminophen (NORCO/VICODIN) 5-325 MG tablet   Pharmacy:  [facility] in [place]     

## 2021-05-23 ENCOUNTER — Encounter (HOSPITAL_COMMUNITY): Payer: Self-pay

## 2021-05-23 ENCOUNTER — Ambulatory Visit (HOSPITAL_COMMUNITY): Payer: Medicare Other

## 2021-05-23 ENCOUNTER — Other Ambulatory Visit: Payer: Self-pay

## 2021-05-23 DIAGNOSIS — R29898 Other symptoms and signs involving the musculoskeletal system: Secondary | ICD-10-CM | POA: Diagnosis not present

## 2021-05-23 DIAGNOSIS — M25612 Stiffness of left shoulder, not elsewhere classified: Secondary | ICD-10-CM

## 2021-05-23 DIAGNOSIS — M25512 Pain in left shoulder: Secondary | ICD-10-CM

## 2021-05-23 MED ORDER — HYDROCODONE-ACETAMINOPHEN 5-325 MG PO TABS: 1.0000 | ORAL_TABLET | Freq: Three times a day (TID) | ORAL | 0 refills | Status: DC | PRN

## 2021-05-23 NOTE — Therapy (Signed)
[facility] [address] [place], [place], [address] Phone: [phone]   Fax:  [phone]  Occupational Therapy Treatment  Patient Details  Name: Kathryn Woods MRN: [id number] Date of Birth: 1950-05-09 Referring Provider (OT): Dr. Thane Edu  Progress Note Reporting Period 04/10/21 to 05/23/21  See note below for Objective Data and Assessment of Progress/Goals.      Encounter Date: 05/23/2021   OT End of Session - 05/23/21 1501     Visit Number 19    Number of Visits 23    Date for OT Re-Evaluation 06/03/21    Authorization Type [employer] Medicare, $30 copay    Authorization Time Period no visit limit    Progress Note Due on Visit 29    OT Start Time 1432    OT Stop Time 1510    OT Time Calculation (min) 38 min    Activity Tolerance Patient tolerated treatment well    Behavior During Therapy WFL for tasks assessed/performed             Past Medical History:  Diagnosis Date   Arthritis    lumbar region, spondylolisthesis    Hypercholesteremia     Past Surgical History:  Procedure Laterality Date   BACK SURGERY     KNEE ARTHROSCOPY WITH LATERAL MENISECTOMY Right 02/25/2019   Procedure: RIGHT KNEE ARTHROSCOPY WITH LATERAL MENISECTOMY;  Surgeon: Vickki Hearing, MD;  Location: [facility];  Service: Orthopedics;  Laterality: Right;   REVERSE SHOULDER ARTHROPLASTY Left 02/22/2021   Procedure: LEFT REVERSE SHOULDER ARTHROPLASTY;  Surgeon: Oliver Barre, MD;  Location: [facility];  Service: Orthopedics;  Laterality: Left;   SHOULDER ARTHROSCOPY WITH OPEN ROTATOR CUFF REPAIR Right 10/15/2018   Procedure: SHOULDER ARTHROSCOPY WITH OPEN ROTATOR CUFF REPAIR;  Surgeon: Vickki Hearing, MD;  Location: [facility];  Service: Orthopedics;  Laterality: Right;   SHOULDER ARTHROSCOPY WITH ROTATOR CUFF REPAIR Left 08/19/2019   Procedure: LEFT SHOULDER ARTHROSCOPY WITH ROTATOR CUFF REPAIR;  Surgeon: Vickki Hearing, MD;  Location: [facility];  Service: Orthopedics;  Laterality: Left;  With scalene block    TUBAL LIGATION      There were no vitals filed for this visit.   Subjective Assessment - 05/23/21 1451     Subjective  S: I had to cancel last time because my back was [employer].    Currently in Pain? Yes    Pain Score 3     Pain Location Shoulder    Pain Orientation Left    Pain Descriptors / Indicators Aching    Pain Type Acute pain    Pain Onset More than a month ago    Pain Frequency Intermittent    Aggravating Factors  increased use    Pain Relieving Factors heat, OTC pain meds    Effect of Pain on Daily Activities min to moderate    Multiple Pain Sites No                [facility] OT Assessment - 05/23/21 1453       Assessment   Medical Diagnosis s/p left reverse TSA      Precautions   Precautions Shoulder    Type of Shoulder Precautions Progress as tolerated            Measurements taken at session on 05/04/21: AROM     Overall AROM Comments assessed seated, er/IR adducted     AROM Assessment Site Shoulder     Right/Left Shoulder Left  Left Shoulder Flexion 118 Degrees   70 previous    Left Shoulder ABduction 126 Degrees   51 previous    Left Shoulder Internal Rotation 90 Degrees   same as previous    Left Shoulder External Rotation 57 Degrees   50 previous         PROM    Overall PROM Comments Assessed seated, er/IR abducted     PROM Assessment Site Shoulder     Right/Left Shoulder Left     Left Shoulder Flexion 153 Degrees   same as previous    Left Shoulder ABduction 156 Degrees   135 previous    Left Shoulder Internal Rotation 75 Degrees   70 previous    Left Shoulder External Rotation 70 Degrees   60 previous         Strength    Overall Strength Comments Assessed IR/er Adducted     Strength Assessment Site Shoulder     Right/Left Shoulder Left     Left Shoulder Flexion 3/5   3-/5 previous    Left Shoulder ABduction 3/5   3-/5 previous    Left Shoulder Internal Rotation  5/5   4-/5 previous    Left Shoulder External Rotation 4-/5   same as previous              OT Treatments/Exercises (OP) - 05/23/21 1454       Exercises   Exercises Shoulder      Shoulder Exercises: Supine   Protraction PROM;5 reps    Horizontal ABduction PROM;5 reps    External Rotation PROM;5 reps    Internal Rotation PROM;5 reps    Flexion PROM;5 reps    ABduction PROM;5 reps      Shoulder Exercises: Prone   Flexion Strengthening;10 reps    Flexion Weight (lbs) 1    Extension Strengthening;10 reps    Extension Weight (lbs) 1    Horizontal ABduction 1 Strengthening;10 reps    Horizontal ABduction 1 Weight (lbs) 1    Other Prone Exercises Bent over row, 1#, 10X      Shoulder Exercises: Standing   External Rotation Strengthening;10 reps    External Rotation Weight (lbs) 1    Internal Rotation Strengthening;10 reps    Internal Rotation Weight (lbs) 1    ABduction Strengthening;10 reps    Shoulder ABduction Weight (lbs) 1      Shoulder Exercises: Therapy Ball   Other Therapy Ball Exercises Green therapy ball; 10X; chest press, flexion, circles (right/left)      Shoulder Exercises: ROM/Strengthening   Ball on Wall 1' flexion green ball                    OT Education - 05/23/21 1500     Education Details Reviewed HEP. Recommended continuing with shoulder stretches. Follow A/ROM standing handout and complete with a low weight (1#) or hold water bottle or can of soup.    Person(s) Educated Patient    Methods Explanation    Comprehension Verbalized understanding              OT Short Term Goals - 05/09/21 1603       OT SHORT TERM GOAL #1   Title Pt will be provided with and educated on HEP to improve ability to use LUE as non-dominant during ADLs.    Time 4    Period Weeks    Target Date 04/07/21      OT SHORT TERM GOAL #2   Title  Pt will increase LUE P/ROM to [facility] to improve ability to perform dressing tasks with minimal compensatory  strategies.    Time 4    Period Weeks      OT SHORT TERM GOAL #3   Title Pt will increase LUE strength to 3/5 to improve ability to reach items at waist to chest height during ADL completion.    Time 4    Period Weeks               OT Long Term Goals - 05/09/21 1603       OT LONG TERM GOAL #1   Title Patient will return to highest level of independence while using her LUE for all daily and household tasks.    Time 8    Period Weeks    Status On-going      OT LONG TERM GOAL #2   Title Patient will increase LUE A/ROM to [facility] in order to reach into overhead cabinets with less difficulty.    Time 8    Period Weeks    Status On-going      OT LONG TERM GOAL #3   Title Patient will decrease fascial restrictions in LUE to min amount or less in order to increase functional mobilty needed to complete reaching and dressing tasks.    Time 8    Period Weeks      OT LONG TERM GOAL #4   Title Patient will increase her LUE strength to 4+/5 in order to return to completing her required yardwork tasks.    Time 8    Period Weeks    Status On-going      OT LONG TERM GOAL #5   Title Patient will report a decrease in pain of approximately 3/10 or less when completing daily and household chores using her LUE.    Time 8    Period Weeks    Status On-going                   Plan - 05/23/21 1503     Clinical Impression Statement A: Due to back pain during therapy ball strengthening, provided cues on engaging abdominal muscles to help provide back support. Patient also encouraged to stand against wall with pelvis in a slight posterior tilt. Pt reports that she continues to have difficulty completing housecleaning tasks such as mopping, sweeping, and cleaning tasks. Also difficulty with reaching above shoulder level and maintaining strength and stability of the shoulder and scapula.  Focused on shoulder and scapular strength with VC for form and technique. Required rest breaks as  needed due to muscle fatigue.    Body Structure / Function / Physical Skills ADL;Endurance;Muscle spasms;UE functional use;Fascial restriction;Pain;ROM;IADL;Strength    Plan P: Continue with shoulder and scapular strengthening. Progress as tolerated.    OT Home Exercise Plan eval: table slides 12/20: AA/ROM except abduction (supine); 12/29: progress AA/ROM to standing 1/17: A/ROM 2/1: shoulder stretches; strengthening with water bottle weight 2/15: shoulder strengthening    Consulted and Agree with Plan of Care Patient             Patient will benefit from skilled therapeutic intervention in order to improve the following deficits and impairments:   Body Structure / Function / Physical Skills: ADL, Endurance, Muscle spasms, UE functional use, Fascial restriction, Pain, ROM, IADL, Strength       Visit Diagnosis: Other symptoms and signs involving the musculoskeletal system  Stiffness of left shoulder, not elsewhere classified  Acute pain  of left shoulder    Problem List Patient Active Problem List   Diagnosis Date Noted   History of failed repair of rotator cuff 02/22/2021   S/P arthroscopy of left shoulder RCR 08/19/19 08/25/2019   Nontraumatic complete tear of left rotator cuff    S/P right knee arthroscopy 02/25/2019 03/02/2019   Complex tear of lateral meniscus of right knee as current injury    Status post rotator cuff repair 10/15/2018 12/21/2018   Biceps tendon rupture, proximal, right, subsequent encounter    Primary osteoarthritis of right shoulder    Labral tear of shoulder, degenerative, right    Synovitis of right shoulder    Spondylolisthesis at L3-L4 level 12/10/2016   Elevated blood-pressure reading, without diagnosis of hypertension 11/06/2016   Bursitis, shoulder 02/24/2013   HYPERLIPIDEMIA 02/04/2007   WRIST SPRAIN, LEFT 02/04/2007   MIGRAINE HEADACHE 01/15/2007   BRONCHITIS, CHRONIC NOS 01/15/2007   CONSTIPATION 01/15/2007   IBS 01/15/2007   DISC  DISEASE, LUMBOSACRAL SPINE 01/15/2007   LOW BACK PAIN, CHRONIC 01/15/2007   MALAISE AND FATIGUE 01/15/2007    Limmie Everlynn, OTR/L,CBIS  [phone]  05/23/2021, 4:02 PM  [facility] [address] [place], [place], [address] Phone: [phone]   Fax:  [phone]  Name: Kathryn Woods MRN: [id number] Date of Birth: 1950/04/27

## 2021-05-25 ENCOUNTER — Ambulatory Visit (HOSPITAL_COMMUNITY): Payer: Medicare Other | Admitting: Occupational Therapy

## 2021-05-25 ENCOUNTER — Encounter (HOSPITAL_COMMUNITY): Payer: Self-pay | Admitting: Occupational Therapy

## 2021-05-25 ENCOUNTER — Other Ambulatory Visit: Payer: Self-pay

## 2021-05-25 DIAGNOSIS — R29898 Other symptoms and signs involving the musculoskeletal system: Secondary | ICD-10-CM | POA: Diagnosis not present

## 2021-05-25 DIAGNOSIS — M25612 Stiffness of left shoulder, not elsewhere classified: Secondary | ICD-10-CM

## 2021-05-25 DIAGNOSIS — M25512 Pain in left shoulder: Secondary | ICD-10-CM

## 2021-05-25 NOTE — Therapy (Signed)
[facility] [address] [place], [place], [address] Phone: [phone]   Fax:  [phone]  Occupational Therapy Treatment  Patient Details  Name: Kathryn Woods MRN: [id number] Date of Birth: [birth year] Referring Provider (OT): Dr. Larena Glassman   Encounter Date: 05/25/2021   OT End of Session - 05/25/21 1511     Visit Number 20    Number of Visits 23    Date for OT Re-Evaluation 06/03/21    Authorization Type [employer] Medicare, $30 copay    Authorization Time Period no visit limit    Progress Note Due on Visit 29    OT Start Time 1433    OT Stop Time 1513    OT Time Calculation (min) 40 min    Activity Tolerance Patient tolerated treatment well    Behavior During Therapy [facility] for tasks assessed/performed             Past Medical History:  Diagnosis Date   Arthritis    lumbar region, spondylolisthesis    Hypercholesteremia     Past Surgical History:  Procedure Laterality Date   BACK SURGERY     KNEE ARTHROSCOPY WITH LATERAL MENISECTOMY Right 02/25/2019   Procedure: RIGHT KNEE ARTHROSCOPY WITH LATERAL MENISECTOMY;  Surgeon: Carole Civil, MD;  Location: [facility];  Service: Orthopedics;  Laterality: Right;   REVERSE SHOULDER ARTHROPLASTY Left 02/22/2021   Procedure: LEFT REVERSE SHOULDER ARTHROPLASTY;  Surgeon: Mordecai Rasmussen, MD;  Location: [facility];  Service: Orthopedics;  Laterality: Left;   SHOULDER ARTHROSCOPY WITH OPEN ROTATOR CUFF REPAIR Right 10/15/2018   Procedure: SHOULDER ARTHROSCOPY WITH OPEN ROTATOR CUFF REPAIR;  Surgeon: Carole Civil, MD;  Location: [facility];  Service: Orthopedics;  Laterality: Right;   SHOULDER ARTHROSCOPY WITH ROTATOR CUFF REPAIR Left 08/19/2019   Procedure: LEFT SHOULDER ARTHROSCOPY WITH ROTATOR CUFF REPAIR;  Surgeon: Carole Civil, MD;  Location: [facility];  Service: Orthopedics;  Laterality: Left;  With scalene block    TUBAL LIGATION      There were no vitals filed for this  visit.   Subjective Assessment - 05/25/21 1431     Subjective  S: I guess the pain in my back makes the shoulder feel better.    Currently in Pain? Yes    Pain Score 3     Pain Location Shoulder    Pain Orientation Left    Pain Descriptors / Indicators Aching    Pain Type Acute pain    Pain Radiating Towards N/A    Pain Onset More than a month ago    Pain Frequency Intermittent    Aggravating Factors  increased use    Pain Relieving Factors heat, OTC pain meds    Effect of Pain on Daily Activities min to mod effect    Multiple Pain Sites No                          OT Treatments/Exercises (OP) - 05/25/21 1436       Exercises   Exercises Shoulder      Shoulder Exercises: Supine   Protraction PROM;5 reps    Horizontal ABduction PROM;5 reps    External Rotation PROM;5 reps    Internal Rotation PROM;5 reps    Flexion PROM;5 reps    ABduction PROM;5 reps      Shoulder Exercises: Seated   Retraction --   2   Protraction Strengthening;10 reps   2 sets  Protraction Weight (lbs) 1    Horizontal ABduction Strengthening;10 reps   2 sets   Horizontal ABduction Weight (lbs) 1    External Rotation Strengthening;10 reps   2 sets   External Rotation Weight (lbs) 1    Internal Rotation Strengthening;10 reps   2 sets   Internal Rotation Weight (lbs) 1    Flexion Strengthening;10 reps   2 sets   Flexion Weight (lbs) 1    Abduction Strengthening;10 reps   2 sets   ABduction Weight (lbs) 1      Shoulder Exercises: ROM/Strengthening   UBE (Upper Arm Bike) Level 1 3' forward, pace: 5.5    Over Head Lace seated using 1# wrist weight. Laced chain starting at top and working towards the bottom then unlaced    X to V Arms 10X, 1#    Proximal Shoulder Strengthening, Seated 10X each 1#, no rest breaks    Ball on Wall 1' flexion 1' abduction green ball                      OT Short Term Goals - 05/09/21 1603       OT SHORT TERM GOAL #1   Title Pt will be  provided with and educated on HEP to improve ability to use LUE as non-dominant during ADLs.    Time 4    Period Weeks    Target Date 04/07/21      OT SHORT TERM GOAL #2   Title Pt will increase LUE P/ROM to [facility] to improve ability to perform dressing tasks with minimal compensatory strategies.    Time 4    Period Weeks      OT SHORT TERM GOAL #3   Title Pt will increase LUE strength to 3/5 to improve ability to reach items at waist to chest height during ADL completion.    Time 4    Period Weeks               OT Long Term Goals - 05/09/21 1603       OT LONG TERM GOAL #1   Title Patient will return to highest level of independence while using her LUE for all daily and household tasks.    Time 8    Period Weeks    Status On-going      OT LONG TERM GOAL #2   Title Patient will increase LUE A/ROM to [facility] in order to reach into overhead cabinets with less difficulty.    Time 8    Period Weeks    Status On-going      OT LONG TERM GOAL #3   Title Patient will decrease fascial restrictions in LUE to min amount or less in order to increase functional mobilty needed to complete reaching and dressing tasks.    Time 8    Period Weeks      OT LONG TERM GOAL #4   Title Patient will increase her LUE strength to 4+/5 in order to return to completing her required yardwork tasks.    Time 8    Period Weeks    Status On-going      OT LONG TERM GOAL #5   Title Patient will report a decrease in pain of approximately 3/10 or less when completing daily and household chores using her LUE.    Time 8    Period Weeks    Status On-going  Plan - 05/25/21 1454     Clinical Impression Statement A: Pt completing tasks in sitting due to back pain today. Focusing on shoulder strengthening and stability with higher reps of each exercise-completing 2 sets of 10 for strengthening. Add abduction for ball on wall and continued with flexion. Completed overhead lacing and  added 1# wrist weight. Rest breaks provided as needed, pt with mod fatigue at end of session. Verbal cuing for form and technique.    Body Structure / Function / Physical Skills ADL;Endurance;Muscle spasms;UE functional use;Fascial restriction;Pain;ROM;IADL;Strength    Plan P: Continue with shoulder and scapular strengthening. Progress as tolerated-focus on higher reps with good form, rest breaks as needed    OT Home Exercise Plan eval: table slides 12/20: AA/ROM except abduction (supine); 12/29: progress AA/ROM to standing 1/17: A/ROM 2/1: shoulder stretches; strengthening with water bottle weight 2/15: shoulder strengthening    Consulted and Agree with Plan of Care Patient             Patient will benefit from skilled therapeutic intervention in order to improve the following deficits and impairments:   Body Structure / Function / Physical Skills: ADL, Endurance, Muscle spasms, UE functional use, Fascial restriction, Pain, ROM, IADL, Strength       Visit Diagnosis: Other symptoms and signs involving the musculoskeletal system  Stiffness of left shoulder, not elsewhere classified  Acute pain of left shoulder    Problem List Patient Active Problem List   Diagnosis Date Noted   History of failed repair of rotator cuff 02/22/2021   S/P arthroscopy of left shoulder RCR 08/19/19 08/25/2019   Nontraumatic complete tear of left rotator cuff    S/P right knee arthroscopy 02/25/2019 03/02/2019   Complex tear of lateral meniscus of right knee as current injury    Status post rotator cuff repair 10/15/2018 12/21/2018   Biceps tendon rupture, proximal, right, subsequent encounter    Primary osteoarthritis of right shoulder    Labral tear of shoulder, degenerative, right    Synovitis of right shoulder    Spondylolisthesis at L3-L4 level 12/10/2016   Elevated blood-pressure reading, without diagnosis of hypertension 11/06/2016   Bursitis, shoulder 02/24/2013   HYPERLIPIDEMIA 02/04/2007    WRIST SPRAIN, LEFT 02/04/2007   MIGRAINE HEADACHE 01/15/2007   BRONCHITIS, CHRONIC NOS 01/15/2007   CONSTIPATION 01/15/2007   IBS 01/15/2007   [place] DISEASE, LUMBOSACRAL SPINE 01/15/2007   LOW BACK PAIN, CHRONIC 01/15/2007   MALAISE AND FATIGUE 01/15/2007    Guadelupe Sabin, OTR/L  [phone] 05/25/2021, 3:14 PM  [facility] [address] [place], [place], [address] Phone: [phone]   Fax:  [phone]  Name: Kathryn Woods MRN: [id number] Date of Birth: [birth year]

## 2021-05-25 NOTE — Patient Instructions (Signed)
°  1) Flexion Wall Stretch    Face wall, place affected handon wall in front of you. Slide hand up the wall  and lean body in towards the wall. Hold for 10 seconds. Repeat 3-5 times. 1-2 times/day.     2) Towel Stretch with Internal Rotation       Gently pull to the side your affected arm  behind your back with the assist of a towel. Hold 10 seconds, repeat 3-5 times. 1-2 times/day.             3) Corner Stretch    Stand at a corner of a wall, place your arms on the walls with elbows bent. Lean into the corner until a stretch is felt along the front of your chest and/or shoulders. Hold for 10 seconds. Repeat 3-5X, 1-2 times/day.    4) Posterior Capsule Stretch    Bring the involved arm across chest. Grasp elbow and pull toward chest until you feel a stretch in the back of the upper arm and shoulder. Hold 10 seconds. Repeat 3-5X. Complete 1-2 times/day.    5) Scapular Retraction    Tuck chin back as you pinch shoulder blades together.  Hold 5 seconds. Repeat 3-5X. Complete 1-2 times/day.    6) External Rotation Stretch:     Place your affected hand on the wall with the elbow bent and gently turn your body the opposite direction until a stretch is felt. Hold 10 seconds, repeat 3-5X. Complete 1-2 times/day.      

## 2021-05-29 ENCOUNTER — Ambulatory Visit (HOSPITAL_COMMUNITY): Payer: Medicare Other | Admitting: Occupational Therapy

## 2021-05-30 ENCOUNTER — Other Ambulatory Visit: Payer: Self-pay

## 2021-05-30 ENCOUNTER — Ambulatory Visit (HOSPITAL_COMMUNITY): Payer: Medicare Other

## 2021-05-30 ENCOUNTER — Encounter (HOSPITAL_COMMUNITY): Payer: Self-pay

## 2021-05-30 DIAGNOSIS — R29898 Other symptoms and signs involving the musculoskeletal system: Secondary | ICD-10-CM

## 2021-05-30 DIAGNOSIS — M25512 Pain in left shoulder: Secondary | ICD-10-CM

## 2021-05-30 DIAGNOSIS — M25612 Stiffness of left shoulder, not elsewhere classified: Secondary | ICD-10-CM

## 2021-05-30 NOTE — Therapy (Signed)
[facility] [address] [place], [place], [address] Phone: [phone]   Fax:  [phone]  Occupational Therapy Treatment Reassessment/discharge Patient Details  Name: Kathryn Woods MRN: [id number] Date of Birth: 1950/05/12 Referring Provider (OT): Dr. Larena Glassman   Encounter Date: 05/30/2021   OT End of Session - 05/30/21 1755     Visit Number 21    Number of Visits 23    Authorization Type [employer] Medicare, $30 copay    Authorization Time Period no visit limit    Progress Note Due on Visit 29    OT Start Time 1430   reassess/discharge   OT Stop Time 1513    OT Time Calculation (min) 43 min    Activity Tolerance Patient tolerated treatment well    Behavior During Therapy [facility] for tasks assessed/performed             Past Medical History:  Diagnosis Date   Arthritis    lumbar region, spondylolisthesis    Hypercholesteremia     Past Surgical History:  Procedure Laterality Date   BACK SURGERY     KNEE ARTHROSCOPY WITH LATERAL MENISECTOMY Right 02/25/2019   Procedure: RIGHT KNEE ARTHROSCOPY WITH LATERAL MENISECTOMY;  Surgeon: Carole Civil, MD;  Location: [facility];  Service: Orthopedics;  Laterality: Right;   REVERSE SHOULDER ARTHROPLASTY Left 02/22/2021   Procedure: LEFT REVERSE SHOULDER ARTHROPLASTY;  Surgeon: Mordecai Rasmussen, MD;  Location: [facility];  Service: Orthopedics;  Laterality: Left;   SHOULDER ARTHROSCOPY WITH OPEN ROTATOR CUFF REPAIR Right 10/15/2018   Procedure: SHOULDER ARTHROSCOPY WITH OPEN ROTATOR CUFF REPAIR;  Surgeon: Carole Civil, MD;  Location: [facility];  Service: Orthopedics;  Laterality: Right;   SHOULDER ARTHROSCOPY WITH ROTATOR CUFF REPAIR Left 08/19/2019   Procedure: LEFT SHOULDER ARTHROSCOPY WITH ROTATOR CUFF REPAIR;  Surgeon: Carole Civil, MD;  Location: [facility];  Service: Orthopedics;  Laterality: Left;  With scalene block    TUBAL LIGATION      There were no vitals filed for  this visit.   Subjective Assessment - 05/30/21 1436     Subjective  S: Monday I was hurting so bad. The worse it was.    Currently in Pain? Yes    Pain Score 6     Pain Location Shoulder    Pain Orientation Left    Pain Descriptors / Indicators Sore;Constant    Pain Type Acute pain    Pain Onset In the past 7 days    Pain Frequency Constant    Aggravating Factors  therapy session on Friday was more intense    Pain Relieving Factors heat, OTC pain meds    Effect of Pain on Daily Activities min-mod effect    Multiple Pain Sites No                [facility] OT Assessment - 05/30/21 1438       Assessment   Medical Diagnosis s/p left reverse TSA      Precautions   Precautions Shoulder    Type of Shoulder Precautions Progress as tolerated      Prior Function   Level of Independence Independent      Observation/Other Assessments   Focus on Therapeutic Outcomes (FOTO)  67/100      ROM / Strength   AROM / PROM / Strength AROM;Strength      Palpation   Palpation comment min fascial restrictions in left upper arm, upper trapezius, and scapularis region.  AROM   Overall AROM Comments assessed seated, er/IR adducted    AROM Assessment Site Shoulder    Right/Left Shoulder Left    Left Shoulder Flexion 137 Degrees   previous: 118   Left Shoulder ABduction 138 Degrees   previous: 126   Left Shoulder Internal Rotation 90 Degrees   previous: same   Left Shoulder External Rotation 70 Degrees   previous: 57     PROM   Overall PROM  Within functional limits for tasks performed    Overall PROM Comments Assessed seated, er/IR abducted      Strength   Overall Strength Comments Assessed IR/er Adducted    Strength Assessment Site Shoulder    Right/Left Shoulder Left    Left Shoulder Flexion 4+/5   previous: 3/5   Left Shoulder ABduction 4/5   previous: 3/5   Left Shoulder Internal Rotation 5/5   previous: same   Left Shoulder External Rotation 5/5   previous: 4-/5                      OT Treatments/Exercises (OP) - 05/30/21 1753       Exercises   Exercises Shoulder      Modalities   Modalities Electrical Stimulation      Electrical Stimulation   Electrical Stimulation Location left shoulder    Electrical Stimulation Action intereferential    Electrical Stimulation Parameters 12.5 CV    Electrical Stimulation Goals Pain                      OT Short Term Goals - 05/09/21 1603       OT SHORT TERM GOAL #1   Title Pt will be provided with and educated on HEP to improve ability to use LUE as non-dominant during ADLs.    Time 4    Period Weeks    Target Date 04/07/21      OT SHORT TERM GOAL #2   Title Pt will increase LUE P/ROM to [facility] to improve ability to perform dressing tasks with minimal compensatory strategies.    Time 4    Period Weeks      OT SHORT TERM GOAL #3   Title Pt will increase LUE strength to 3/5 to improve ability to reach items at waist to chest height during ADL completion.    Time 4    Period Weeks               OT Long Term Goals - 05/30/21 1452       OT LONG TERM GOAL #1   Title Patient will return to highest level of independence while using her LUE for all daily and household tasks.    Baseline 2/22: Sweeping is challening and uses one hand.    Time 8    Period Weeks    Status Partially Met      OT LONG TERM GOAL #2   Title Patient will increase LUE A/ROM to [facility] in order to reach into overhead cabinets with less difficulty.    Time 8    Period Weeks    Status Achieved      OT LONG TERM GOAL #3   Title Patient will decrease fascial restrictions in LUE to min amount or less in order to increase functional mobilty needed to complete reaching and dressing tasks.    Time 8    Period Weeks      OT LONG TERM GOAL #4   Title  Patient will increase her LUE strength to 4+/5 in order to return to completing her required yardwork tasks.    Time 8    Period Weeks    Status Partially Met       OT LONG TERM GOAL #5   Title Patient will report a decrease in pain of approximately 3/10 or less when completing daily and household chores using her LUE.    Time 8    Period Weeks    Status Not Met                   Plan - 05/30/21 1756     Clinical Impression Statement A: reassessment completed this date. Patient typically reports a low pain score of 3/10 when attending therapy although states that on Monday she experienced increased pain with no known cause. She demonstrates A/ROM [facility] and functional shoulder strength. Her FOTO score has increased to 67/100. HEP reviewed and updated as needed. Discussed frequency of completion. All education was completed. Patient is ready for discharge.    Body Structure / Function / Physical Skills ADL;Endurance;Muscle spasms;UE functional use;Fascial restriction;Pain;ROM;IADL;Strength    Plan P: discharge from OT services with HEP.    OT Home Exercise Plan eval: table slides 12/20: AA/ROM except abduction (supine); 12/29: progress AA/ROM to standing 1/17: A/ROM 2/1: shoulder stretches; strengthening with water bottle weight 2/15: shoulder strengthening 2/22: shoulder strengthening with yellow and red band.    Consulted and Agree with Plan of Care Patient             Patient will benefit from skilled therapeutic intervention in order to improve the following deficits and impairments:   Body Structure / Function / Physical Skills: ADL, Endurance, Muscle spasms, UE functional use, Fascial restriction, Pain, ROM, IADL, Strength       Visit Diagnosis: Other symptoms and signs involving the musculoskeletal system  Stiffness of left shoulder, not elsewhere classified  Acute pain of left shoulder    Problem List Patient Active Problem List   Diagnosis Date Noted   History of failed repair of rotator cuff 02/22/2021   S/P arthroscopy of left shoulder RCR 08/19/19 08/25/2019   Nontraumatic complete tear of left rotator cuff    S/P  right knee arthroscopy 02/25/2019 03/02/2019   Complex tear of lateral meniscus of right knee as current injury    Status post rotator cuff repair 10/15/2018 12/21/2018   Biceps tendon rupture, proximal, right, subsequent encounter    Primary osteoarthritis of right shoulder    Labral tear of shoulder, degenerative, right    Synovitis of right shoulder    Spondylolisthesis at L3-L4 level 12/10/2016   Elevated blood-pressure reading, without diagnosis of hypertension 11/06/2016   Bursitis, shoulder 02/24/2013   HYPERLIPIDEMIA 02/04/2007   WRIST SPRAIN, LEFT 02/04/2007   MIGRAINE HEADACHE 01/15/2007   BRONCHITIS, CHRONIC NOS 01/15/2007   CONSTIPATION 01/15/2007   IBS 01/15/2007   [place] DISEASE, LUMBOSACRAL SPINE 01/15/2007   LOW BACK PAIN, CHRONIC 01/15/2007   MALAISE AND FATIGUE 01/15/2007     OCCUPATIONAL THERAPY DISCHARGE SUMMARY  Visits from Start of Care: 21  Current functional level related to goals / functional outcomes: See above   Remaining deficits: See above   Education / Equipment: See above   Patient agrees to discharge. Patient goals were  mostly met . Patient is being discharged due to being pleased with the current functional level.Ailene Ravel, OTR/L,CBIS  [phone]  05/30/2021, 6:00 PM  [facility] [address] [place], [place], [address] Phone: [phone]   Fax:  [phone]  Name: Kathryn Woods MRN: [id number] Date of Birth: 10-Aug-1950

## 2021-05-30 NOTE — Patient Instructions (Signed)
° °  Complete the following exercises at least 1-2 time a day or as needed.   Doorway Stretch  Place each hand opposite each other on the doorway. (You can change where you feel the stretch by moving arms higher or lower.) Step through with one foot and bend front knee until a stretch is felt and hold. Step through with the opposite foot on the next rep. Hold for __20-30___ seconds. Repeat __2__times.      Internal Rotation Across Back  Grab the end of a towel with your affected side, palm facing backwards. Grab the towel with your unaffected side and pull your affected hand across your back until you feel a stretch in the front of your shoulder. If you feel pain, pull just to the pain, do not pull through the pain. Hold. Return your affected arm to your side. Try to keep your hand/arm close to your body during the entire movement.     Hold for 20-30 seconds. Complete 2 times.        Posterior Capsule Stretch   Stand or sit, one arm across body so hand rests over opposite shoulder. Gently push on crossed elbow with other hand until stretch is felt in shoulder of crossed arm. Hold _20-30__ seconds.  Repeat _2__ times per session.    Wall Flexion  Slide your arm up the wall or door frame until a stretch is felt in your shoulder . Hold for 20-30 seconds. Complete 2 times     Shoulder Abduction Stretch  Stand side ways by a wall with affected up on wall. Gently step in toward wall to feel stretch. Hold for 20-30 seconds. Complete 2 times.       1) Strengthening: Chest Pull - Resisted   Hold Theraband in front of body with hands about shoulder width a part. Pull band a part and back together slowly. Repeat _10___ times. Complete ___1_ set(s) per session.. Repeat __1__ session(s) every other day.  http://orth.exer.us/926   Copyright  VHI. All rights reserved.   2) PNF Strengthening: Resisted   Standing with resistive band around each hand, bring right arm up and away,  thumb back. Repeat ___10_ times per set. Do ___1_ sets per session. Do _1___ sessions every other day.          3) Resisted External Rotation: in Neutral - Bilateral   Sit or stand, tubing in both hands, elbows at sides, bent to 90, forearms forward. Pinch shoulder blades together and rotate forearms out. Keep elbows at sides. Repeat __10__ times per set. Do _1___ sets per session. Do _1___ sessions every other day.  http://orth.exer.us/966   Copyright  VHI. All rights reserved.   4) PNF Strengthening: Resisted   Standing, hold resistive band above head. Bring right arm down and out from side. Repeat _10___ times per set. Do __1__ sets per session. Do __1__ sessions every other day.  http://orth.exer.us/922   Copyright  VHI. All rights reserved.

## 2021-06-08 ENCOUNTER — Emergency Department (HOSPITAL_COMMUNITY)
Admission: EM | Admit: 2021-06-08 | Discharge: 2021-06-08 | Disposition: A | Discharge status: Home / Self Care | Payer: Medicare PPO | Attending: Emergency Medicine | Admitting: Emergency Medicine

## 2021-06-08 ENCOUNTER — Other Ambulatory Visit: Payer: Self-pay

## 2021-06-08 ENCOUNTER — Emergency Department (HOSPITAL_COMMUNITY): Payer: Medicare PPO

## 2021-06-08 DIAGNOSIS — R1031 Right lower quadrant pain: Secondary | ICD-10-CM | POA: Diagnosis present

## 2021-06-08 MED ORDER — HYDROCODONE-ACETAMINOPHEN 5-325 MG PO TABS: ORAL_TABLET | ORAL | 0 refills | Status: DC

## 2021-06-08 MED ORDER — NAPROXEN 500 MG PO TABS: 500.0000 mg | ORAL_TABLET | Freq: Two times a day (BID) | ORAL | 0 refills | Status: DC

## 2021-06-08 MED ORDER — IBUPROFEN 400 MG PO TABS
600.0000 mg | ORAL_TABLET | Freq: Once | ORAL | Status: AC
  Administered 2021-06-08: 600 mg via ORAL
  Filled 2021-06-08: qty 2

## 2021-06-08 MED ORDER — KETOROLAC TROMETHAMINE 60 MG/2ML IM SOLN
30.0000 mg | Freq: Once | INTRAMUSCULAR | Status: AC
  Administered 2021-06-08: 30 mg via INTRAMUSCULAR
  Filled 2021-06-08: qty 2

## 2021-06-08 NOTE — Discharge Instructions (Signed)
Continue to alternate ice and heat to your groin area.  Avoid twisting or excessive walking or standing.  No heavy lifting.  Please follow-up with Dr. Mort Sawyers office next week if your symptoms are not improving. ?

## 2021-06-08 NOTE — ED Triage Notes (Signed)
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 Constant pain 10/10. Hurts to bear weight. Denies fever. Denies urinary symptoms. ?

## 2021-06-08 NOTE — ED Provider Notes (Signed)
?[facility] ?Provider Note ? ? ?CSN: [id number] ?Arrival date & time: 06/08/21  1220 ? ?  ? ?History ? ?Chief Complaint  ?Patient presents with  ? Groin Pain  ? ? ?Kathryn Woods is a 71 y.o. female. ? ? ?Groin Pain ?Pertinent negatives include no chest pain, no abdominal pain and no shortness of breath.  ? ?  ? ?Kathryn Woods is a 71 y.o. female who presents to the Emergency Department complaining of right groin pain for 1 week.  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  She describes sharp stabbing type pain of her groin associated with walking, lifting her leg or kicking her leg back behind her.  She has tried multiple over-the-counter remedies without relief.  Pain improves while at rest.  She denies any abdominal pain, fever, chills, back pain, urinary symptoms or pain with urination or defecation.  No swelling or pain radiating down her leg. ? ? ? ?Home Medications ?Prior to Admission medications   ?Medication Sig Start Date End Date Taking? Authorizing Provider  ?HYDROcodone-acetaminophen (NORCO/VICODIN) 5-325 MG tablet Take 1 tablet by mouth every 8 (eight) hours as needed for moderate pain. 05/23/21   Mordecai Rasmussen, MD  ?Multiple Vitamin (MULTIVITAMIN) tablet Take 2 tablets by mouth daily. Gummies    [provider]  ?Omega-3 Fatty Acids (FISH OIL) 1000 MG CAPS Take 1,000 mg by mouth daily.    [provider]  ?vitamin B-12 (CYANOCOBALAMIN) 1000 MCG tablet Take 1,000 mcg by mouth daily.    [provider]  ?   ? ?Allergies    ?Aspirin   ? ?Review of Systems   ?Review of Systems  ?Respiratory:  Negative for chest tightness and shortness of breath.   ?Cardiovascular:  Negative for chest pain and leg swelling.  ?Gastrointestinal:  Negative for abdominal pain, nausea and vomiting.  ?Musculoskeletal:  Positive for myalgias (right groin  pain).  ?Neurological:  Negative for weakness and numbness.  ?All other systems reviewed and are negative. ? ?Physical Exam ?Updated Vital Signs ?BP (!) 165/94 (BP Location: Right Arm)   Pulse 77   Temp 97.8 ?F (36.6 ?C) (Oral)   Resp 20   Ht 5\' 6"  (1.676 m)   Wt 72.6 kg   SpO2 97%   BMI 25.82 kg/m?  ?Physical Exam ?Vitals and nursing note reviewed.  ?Constitutional:   ?   General: She is not in acute distress. ?   Appearance: Normal appearance. She is not toxic-appearing.  ?Cardiovascular:  ?   Rate and Rhythm: Normal rate and regular rhythm.  ?   Pulses: Normal pulses.  ?Pulmonary:  ?   Effort: Pulmonary effort is normal.  ?Abdominal:  ?   General: There is no distension.  ?   Palpations: Abdomen is soft.  ?   Tenderness: There is no abdominal tenderness.  ?Musculoskeletal:     ?   General: Normal range of motion.  ?   Right lower leg: No edema.  ?   Left lower leg: No edema.  ?   Comments: Localized tenderness of the right groin to palpation and reproduces with SLR and abduction of the leg.  No rash, edema or skin changes on exam.    ?Skin: ?   General: Skin is warm.  ?   Capillary Refill: Capillary refill takes less than 2 seconds.  ?   Findings: No bruising,  erythema or rash.  ?Neurological:  ?   General: No focal deficit present.  ?   Mental Status: She is alert.  ?   Sensory: No sensory deficit.  ?   Motor: No weakness.  ? ? ?ED Results / Procedures / Treatments   ?Labs ?(all labs ordered are listed, but only abnormal results are displayed) ?Labs Reviewed - No data to display ? ?EKG ?None ? ?Radiology ?DG Hip Unilat  With Pelvis 2-3 Views Right ? ?Result Date: 06/08/2021 ?CLINICAL DATA:  Right hip and groin pain. Worse with weight-bearing. EXAM: DG HIP (WITH OR WITHOUT PELVIS) 2-3V RIGHT COMPARISON:  Right hip radiographs 04/12/2020 FINDINGS: Right hip is located. Moderate degenerative changes are again noted with prominent osteophytes. No acute or healing fracture is present. More mild degenerative  changes are present in the left hip. IMPRESSION: No acute abnormality or significant interval change. 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 Electronically Signed   By: San Morelle M.D.   On: 06/08/2021 14:05   ? ?Procedures ?Procedures  ? ? ?Medications Ordered in ED ?Medications  ?ibuprofen (ADVIL) tablet 600 mg (600 mg Oral Given 06/08/21 1349)  ?ketorolac (TORADOL) injection 30 mg (30 mg Intramuscular Given 06/08/21 1532)  ? ? ?ED Course/ Medical Decision Making/ A&P ?  ?                        ?Medical Decision Making ?Amount and/or Complexity of Data Reviewed ?Radiology: ordered. ? ?Risk ?Prescription drug management. ? ? ?Pt here for evaluation of groin pain that began suddenly after attempting to lift her elderly mother.   ? ?Pain reproducible with SLR and external rotation.  XR w/o evidence of bony injury.  Likely MSK.  NV intact and pt is able to ambulate and bear some wt to affect extremity.   ? ?Will provide short course of pain medication, crutches and she will f/u with orthopedics or PCP if not improving.   ? ? ? ? ? ? ? ? ?Final Clinical Impression(s) / ED Diagnoses ?Final diagnoses:  ?Groin pain, right  ? ? ?Rx / DC Orders ?ED Discharge Orders   ? ? None  ? ?  ? ? ?  ?Kem Parkinson, PA-C ?06/11/21 1449 ? ?  ?Davonna Belling, MD ?06/12/21 1230 ? ?

## 2021-06-25 ENCOUNTER — Ambulatory Visit: Payer: Medicare PPO | Admitting: Orthopedic Surgery

## 2021-06-27 ENCOUNTER — Other Ambulatory Visit: Payer: Self-pay

## 2021-06-27 ENCOUNTER — Ambulatory Visit (INDEPENDENT_AMBULATORY_CARE_PROVIDER_SITE_OTHER): Payer: Medicare PPO | Admitting: Orthopedic Surgery

## 2021-06-27 DIAGNOSIS — S73191A Other sprain of right hip, initial encounter: Secondary | ICD-10-CM | POA: Diagnosis not present

## 2021-06-27 DIAGNOSIS — M25551 Pain in right hip: Secondary | ICD-10-CM

## 2021-06-27 MED ORDER — HYDROCODONE-ACETAMINOPHEN 5-325 MG PO TABS: ORAL_TABLET | ORAL | 0 refills | Status: DC

## 2021-06-27 NOTE — Progress Notes (Signed)
EVALUATION AND MANAGEMENT  ? ?Type of appointment : ER follow-up established patient ? ?The patient has signs and symptoms of acetabular labral tear ? ?Encounter Diagnoses  ?Name Primary?  ? Pain in right hip   ? Tear of right acetabular labrum, initial encounter Yes  ? ? ?She had Naprosyn and was intolerant with hives and itching ? ?Aspirin gives her an upset stomach ? ?PLAN: MRI right hip with IV contrast ? ?Call patient with result ? ?Recommend hydrocodone for severe pain ? ?Meds ordered this encounter  ?Medications  ? HYDROcodone-acetaminophen (NORCO/VICODIN) 5-325 MG tablet  ?  Sig: Take one tab po q 4 hrs prn pain  ?  Dispense:  20 tablet  ?  Refill:  0  ? ? ? ?Chief Complaint  ?Patient presents with  ? Hip Pain  ?  Right, pain in the groin  ? ? 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  She says she went to help her mom stepped with her right leg felt something tear or pull in the groin and then could not ambulate ? ?Images were obtained she had some arthritis in the hip area.  She was put on naproxen which gave her hives and itching she was also given some hydrocodone for severe pain she is taken some of those which seem to help when the pain is bad ? ?She complains of pain over the anterior hip joint catching sensation occasionally when she is in certain positions or steps wrong on the hip ? ?She says it does not feel like arthritis because it is not a dull ache does not seem to be activity related in terms of how much activity she is doing ? ?PHYSICAL EXAM SECTION: ?There were no vitals taken for this visit.  There is no height or weight on file to calculate BMI. ? ? ?General appearance: Well-developed well-nourished no gross deformities ? ?Eyes clear normal vision no evidence of conjunctivitis or  jaundice, extraocular muscles intact ? ?ENT: ears hearing normal, nasal passages clear, throat clear  ? ?Lymph nodes: No lymphadenopathy ? ?Neck is supple without palpable mass, full range of motion ? ?Cardiovascular normal pulse and perfusion in all 4 extremities normal color without edema ? ?Neurologically deep tendon reflexes are equal and normal, no sensation loss or deficits no pathologic reflexes ? ?Psychological: Awake alert and oriented x3 mood and affect normal ? ?Skin no lacerations or ulcerations no nodularity no palpable masses, no erythema or nodularity ? ?Musculoskeletal: She does have tenderness over the anterior hip does not appear to be the hip flexors deeper she has pain with flexion internal rotation.  She can perform a straight leg lift with some discomfort.  She has pain with abduction but not abduction ? ?X-ray again was done at the hospital on 3 March and my interpretation after reviewing the films again mild arthritis primarily inferior portion of the femoral head and acetabulum ? ? ? ? ? ?ROS she is having some back pain she is getting injections for that her left shoulder arthroplasty is performing well ? ? ?There is no height or weight on file to calculate BMI. ? ? ? ?Past Medical History:  ?Diagnosis Date  ? Arthritis   ? lumbar region, spondylolisthesis   ? Hypercholesteremia   ? ?Past Surgical History:  ?Procedure Laterality Date  ?  BACK SURGERY    ? KNEE ARTHROSCOPY WITH LATERAL MENISECTOMY Right 02/25/2019  ? Procedure: RIGHT KNEE ARTHROSCOPY WITH LATERAL MENISECTOMY;  Surgeon: Carole Civil, MD;  Location: [facility];  Service: Orthopedics;  Laterality: Right;  ? REVERSE SHOULDER ARTHROPLASTY Left 02/22/2021  ? Procedure: LEFT REVERSE SHOULDER ARTHROPLASTY;  Surgeon: Mordecai Rasmussen, MD;  Location: [facility];  Service: Orthopedics;  Laterality: Left;  ? SHOULDER ARTHROSCOPY WITH OPEN ROTATOR CUFF REPAIR Right 10/15/2018  ? Procedure: SHOULDER ARTHROSCOPY WITH OPEN ROTATOR CUFF REPAIR;   Surgeon: Carole Civil, MD;  Location: [facility];  Service: Orthopedics;  Laterality: Right;  ? SHOULDER ARTHROSCOPY WITH ROTATOR CUFF REPAIR Left 08/19/2019  ? Procedure: LEFT SHOULDER ARTHROSCOPY WITH ROTATOR CUFF REPAIR;  Surgeon: Carole Civil, MD;  Location: [facility];  Service: Orthopedics;  Laterality: Left;  With scalene block   ? TUBAL LIGATION    ? ?Social History  ? ?Tobacco Use  ? Smoking status: Former  ?  Types: Cigarettes  ?  Quit date: 12/06/1982  ?  Years since quitting: 38.5  ? Smokeless tobacco: Never  ?Vaping Use  ? Vaping Use: Never used  ?Substance Use Topics  ? Alcohol use: No  ? Drug use: Yes  ?  Types: Marijuana  ?  Comment: a couple times- for pain relief, pt. informed to not use anymore before surgery   ? ? ? ?Assessment and Plan: ? ?Encounter Diagnoses  ?Name Primary?  ? Pain in right hip   ? Tear of right acetabular labrum, initial encounter Yes  ? ? ?Probably has an acetabular labral tear ? ?We will get an MRI of the hip with intra-articular contrast and call her with the results ? ? ?Current Outpatient Medications:  ?  HYDROcodone-acetaminophen (NORCO/VICODIN) 5-325 MG tablet, Take one tab po q 4 hrs prn pain, Disp: 20 tablet, Rfl: 0 ?  Multiple Vitamin (MULTIVITAMIN) tablet, Take 2 tablets by mouth daily. Gummies, Disp: , Rfl:  ?  Omega-3 Fatty Acids (FISH OIL) 1000 MG CAPS, Take 1,000 mg by mouth daily., Disp: , Rfl:  ?  vitamin B-12 (CYANOCOBALAMIN) 1000 MCG tablet, Take 1,000 mcg by mouth daily., Disp: , Rfl:  ? ?Meds ordered this encounter  ?Medications  ? HYDROcodone-acetaminophen (NORCO/VICODIN) 5-325 MG tablet  ?  Sig: Take one tab po q 4 hrs prn pain  ?  Dispense:  20 tablet  ?  Refill:  0  ? ? ?

## 2021-06-28 ENCOUNTER — Other Ambulatory Visit: Payer: Self-pay | Admitting: Orthopedic Surgery

## 2021-06-28 DIAGNOSIS — S73191A Other sprain of right hip, initial encounter: Secondary | ICD-10-CM

## 2021-07-04 ENCOUNTER — Other Ambulatory Visit: Payer: Self-pay

## 2021-07-04 ENCOUNTER — Ambulatory Visit (INDEPENDENT_AMBULATORY_CARE_PROVIDER_SITE_OTHER): Payer: Medicare PPO | Admitting: Orthopedic Surgery

## 2021-07-04 ENCOUNTER — Ambulatory Visit: Payer: Medicare PPO

## 2021-07-04 VITALS — Ht 65.5 in | Wt 170.0 lb

## 2021-07-04 DIAGNOSIS — G8929 Other chronic pain: Secondary | ICD-10-CM

## 2021-07-04 DIAGNOSIS — Z96612 Presence of left artificial shoulder joint: Secondary | ICD-10-CM | POA: Diagnosis not present

## 2021-07-05 ENCOUNTER — Encounter: Payer: Self-pay | Admitting: Orthopedic Surgery

## 2021-07-05 NOTE — Progress Notes (Signed)
Orthopaedic Postop Note ? ?Assessment: ?Kathryn Woods is a 71 y.o. female s/p Left Reverse Shoulder Arthroplasty, in setting of failed rotator cuff repair ? ?DOS: 02/22/2021 ? ?Plan: ?Kathryn Woods has been improving since she was last seen in clinic.  She noted acute onset of severe pain in the left shoulder when doing some of her exercises recently.  She continues to have severe pain, but it has improved.  I reviewed radiographs with her in clinic today which are without obvious injury.  I provided some reassurance, and suggested that we allow things to settle down to see how much this improves.  She is in agreement with this plan.  I will be out of clinic in 2 weeks, and she will be out of town the following week.  As a result, we will see her back in approximately 1 month.  Her neck scheduled appointment for early May will be scheduled earlier as a result.  Activities as tolerated.  Medications as needed. ? ? ?Follow-up: ?Return in about 4 weeks (around 08/01/2021). ? ?XR at next visit: Left shoulder ? ?Subjective: ? ?Chief Complaint  ?Patient presents with  ? Follow-up  ?  Recheck on left shoulder, DOS 02-22-21.  ? ? ?History of Present Illness: ?Kathryn Woods is a 71 y.o. female who presents following the above stated procedure.  Surgery was approximately 4 months ago.  Since she was last seen in clinic, she states she was doing very well.  She continue to work on her exercises.  Her range of motion, pain and strength were all improving.  She is doing some of her exercises earlier this week, when she noted severe pain in the left shoulder.  No specific injury.  She states that she was doing an activity which required internal rotation.  She felt sudden onset of sharp pain.  At that point, she states she was unable to move her arm.  This slowly improved, the next day she was able to move her arm some, but she is still having pain and limited motion in her shoulder.  No numbness or  tingling.  Pain is over the lateral aspect of the shoulder.   ? ?Review of Systems: ?No fevers or chills ?No numbness or tingling ?No Chest Pain ?No shortness of breath ? ? ?Objective: ?Ht 5' 5.5" (1.664 m)   Wt 170 lb (77.1 kg)   BMI 27.86 kg/m?  ? ?Physical Exam: ? ?Alert and oriented.  No acute distress. ? ?Surgical incision is healing well.  No surrounding erythema or drainage. ?Active forward flexion to 90 degrees.  Abduction to 70 degrees.  Tenderness palpation over the lateral shoulder.  No tenderness to palpation of the [facility] joint, or the acromion.  No tenderness to palpation of the posterior shoulder.  Fingers are warm and well-perfused.  Internal rotation to side of her hip. ? ?IMAGING: ?I personally ordered and reviewed the following images: ? ?X-rays left shoulder obtained in clinic today.  These were compared to prior x-rays.  Shoulder joint remains reduced.  No evidence of subsidence.  Small amount of lucency at the most proximal extent of the humeral prosthesis, which has not progressed, but has evolved since the last x-ray.  No appreciable difference of the glenoid. ? ?Impression: Stable left reverse shoulder arthroplasty, without hardware failure or loosening. ? ?Oliver Barre, MD ?07/05/2021 ?12:38 PM ? ? ?

## 2021-07-09 ENCOUNTER — Ambulatory Visit: Payer: Medicare PPO | Admitting: Orthopedic Surgery

## 2021-07-18 ENCOUNTER — Ambulatory Visit (HOSPITAL_COMMUNITY): Payer: Medicare PPO

## 2021-07-18 ENCOUNTER — Ambulatory Visit (HOSPITAL_COMMUNITY)
Admission: RE | Admit: 2021-07-18 | Discharge: 2021-07-18 | Disposition: A | Discharge status: Home / Self Care | Payer: Medicare PPO | Source: Ambulatory Visit | Attending: Orthopedic Surgery | Admitting: Orthopedic Surgery

## 2021-07-18 ENCOUNTER — Other Ambulatory Visit: Payer: Self-pay | Admitting: Orthopedic Surgery

## 2021-07-18 DIAGNOSIS — M25551 Pain in right hip: Secondary | ICD-10-CM | POA: Insufficient documentation

## 2021-07-18 DIAGNOSIS — S73191A Other sprain of right hip, initial encounter: Secondary | ICD-10-CM

## 2021-07-18 DIAGNOSIS — X58XXXA Exposure to other specified factors, initial encounter: Secondary | ICD-10-CM | POA: Insufficient documentation

## 2021-07-18 MED ORDER — IOHEXOL 180 MG/ML  SOLN
20.0000 mL | Freq: Once | INTRAMUSCULAR | Status: AC | PRN
  Administered 2021-07-18: 15 mL

## 2021-07-18 MED ORDER — POVIDONE-IODINE 10 % EX SOLN
CUTANEOUS | Status: AC
  Filled 2021-07-18: qty 14.8

## 2021-07-18 MED ORDER — LIDOCAINE HCL (PF) 1 % IJ SOLN
INTRAMUSCULAR | Status: AC
  Administered 2021-07-18: 5 mL
  Filled 2021-07-18: qty 5

## 2021-07-18 MED ORDER — SODIUM CHLORIDE (PF) 0.9 % IJ SOLN
INTRAMUSCULAR | Status: AC
  Administered 2021-07-18: 5 mL
  Filled 2021-07-18: qty 10

## 2021-07-18 MED ORDER — GADOBUTROL 1 MMOL/ML IV SOLN
1.0000 mL | Freq: Once | INTRAVENOUS | Status: AC | PRN
  Administered 2021-07-18: 0.05 mL

## 2021-07-18 NOTE — Progress Notes (Signed)
Initial BP readings hypertensive, with SBP 107 and 110 respectively on different UE with final reading SBP 94after changing cuff size to Reg adult. Advised pt to seek FU with PCP to monitor for trends that may need attention. ?

## 2021-07-18 NOTE — Procedures (Signed)
Preprocedure Dx: RT hip pain ?Postprocedure Dx: RT hip pain ?Procedure  Fluoroscopically guided RT hip joint injection for MR arthrography ?Radiologist:  Tyron Russell ?Anesthesia:  5 ml of 1% lidocaine ?Injectate:  10 ml of standard MR arthrography solution ?Fluoro time:  0 minutes 30 seconds ?EBL:   None ?Complications: None  ?

## 2021-07-27 ENCOUNTER — Telehealth: Payer: Self-pay

## 2021-07-27 NOTE — Telephone Encounter (Signed)
Patient called this morning stating that she had her MRI 07/18/21 and has not heard anything.  ?She is asking if she needs an appointment with Dr. Romeo Apple or could he call her with  ?the results.  ?Please advise ?

## 2021-07-30 NOTE — Telephone Encounter (Signed)
Noted  

## 2021-07-31 ENCOUNTER — Other Ambulatory Visit: Payer: Self-pay | Admitting: Orthopedic Surgery

## 2021-07-31 ENCOUNTER — Telehealth: Payer: Self-pay | Admitting: Orthopedic Surgery

## 2021-07-31 DIAGNOSIS — S73191A Other sprain of right hip, initial encounter: Secondary | ICD-10-CM

## 2021-07-31 DIAGNOSIS — M25551 Pain in right hip: Secondary | ICD-10-CM

## 2021-07-31 MED ORDER — HYDROCODONE-ACETAMINOPHEN 5-325 MG PO TABS
ORAL_TABLET | ORAL | 0 refills | Status: DC
Start: 2021-07-31 — End: 2021-08-27

## 2021-07-31 NOTE — Progress Notes (Signed)
Meds ordered this encounter  ?Medications  ? HYDROcodone-acetaminophen (NORCO/VICODIN) 5-325 MG tablet  ?  Sig: Take one tab po q 4 hrs prn pain  ?  Dispense:  20 tablet  ?  Refill:  0  ? ? ?

## 2021-07-31 NOTE — Telephone Encounter (Signed)
I gave her her results she has arthritis of the hip I reviewed her MRI and her plain films the MRI looks worse than the plain film ? ?She says it depends on the position she is in as to how much pain she has ? 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 ?If she changes her mind we can send her to Dr. Magnus Ivan for an anterior hip I think she is a great candidate ?

## 2021-08-01 ENCOUNTER — Ambulatory Visit (INDEPENDENT_AMBULATORY_CARE_PROVIDER_SITE_OTHER): Payer: Medicare PPO | Admitting: Orthopedic Surgery

## 2021-08-01 ENCOUNTER — Encounter: Payer: Self-pay | Admitting: Orthopedic Surgery

## 2021-08-01 DIAGNOSIS — Z96612 Presence of left artificial shoulder joint: Secondary | ICD-10-CM

## 2021-08-01 NOTE — Progress Notes (Signed)
Orthopaedic Postop Note ? ?Assessment: ?Kathryn Woods is a 71 y.o. female s/p Left Reverse Shoulder Arthroplasty, in setting of failed rotator cuff repair ? ?DOS: 02/22/2021 ? ?Plan: ?[name] is doing much better since she was last seen in clinic.  The pain she experienced is completely resolved.  She is able to do over then she wants to do with her left shoulder.  No issues with her incisions.  I urged her to continue working on exercises, she can see improvements up to a full year after surgery.  She stated understanding.  I will see her back in 6 months. ? ? ?Follow-up: ?Return in about 6 months (around 01/31/2022). ? ?XR at next visit: Left shoulder ? ?Subjective: ? ?Chief Complaint  ?Patient presents with  ? Shoulder Pain  ?  S/p  Lt RSA DOS: 02/22/2021 ?  ? ? ?History of Present Illness: ?Kathryn Woods is a 71 y.o. female who presents following the above stated procedure.  Surgery was approximately 5-6 months ago.  I saw her about a month ago, and she was having a lot of pain in her left shoulder, which was brought on by some of her exercises.  Since then, the pain is improved.  She is able to do her exercises again.  She is not having any issues at this time.  Whatever was causing her pain in the last time, has improved. ? ?Review of Systems: ?No fevers or chills ?No numbness or tingling ?No Chest Pain ?No shortness of breath ? ? ?Objective: ?There were no vitals taken for this visit. ? ?Physical Exam: ? ?Alert and oriented.  No acute distress. ? ?Surgical incision is healing well.  No surrounding erythema or drainage. ?Active forward flexion to 140 degrees.  Abduction to 100 degrees.  No tenderness to palpation of the [facility] joint, or the acromion.  No tenderness to palpation of the posterior shoulder.  Fingers are warm and well-perfused.  She was able to put her arms behind her, and help her self out of the chair, without discomfort.  No numbness or tingling  distally. ? ?IMAGING: ?I personally ordered and reviewed the following images: ? ?No new x-rays obtained today. ? ?Oliver Barre, MD ?08/01/2021 ?10:58 AM ? ? ?

## 2021-08-14 ENCOUNTER — Ambulatory Visit: Payer: Medicare Other | Admitting: Orthopedic Surgery

## 2021-08-27 ENCOUNTER — Other Ambulatory Visit: Payer: Self-pay

## 2021-08-27 ENCOUNTER — Telehealth: Payer: Self-pay

## 2021-08-27 DIAGNOSIS — S73191A Other sprain of right hip, initial encounter: Secondary | ICD-10-CM

## 2021-08-27 DIAGNOSIS — M25551 Pain in right hip: Secondary | ICD-10-CM

## 2021-08-27 DIAGNOSIS — Z96612 Presence of left artificial shoulder joint: Secondary | ICD-10-CM

## 2021-08-27 MED ORDER — HYDROCODONE-ACETAMINOPHEN 5-325 MG PO TABS: 1.0000 | ORAL_TABLET | Freq: Three times a day (TID) | ORAL | 0 refills | Status: DC | PRN

## 2021-08-27 NOTE — Telephone Encounter (Signed)
done

## 2021-08-27 NOTE — Telephone Encounter (Signed)
Request sent to provider.

## 2021-08-27 NOTE — Telephone Encounter (Signed)
Hydrocodone-Acetaminophen 5/325 MG Qty 20 Tablets  Take 1 tab po q 4 hrs prn pain  PATIENT USES [place] [facility]

## 2021-08-27 NOTE — Telephone Encounter (Signed)
Pharmacist at [facility] called stating that this patient's prescription has 2 sets of directions and they need clarification.  [phone]

## 2021-08-28 ENCOUNTER — Other Ambulatory Visit: Payer: Self-pay | Admitting: Orthopedic Surgery

## 2021-08-28 DIAGNOSIS — M25551 Pain in right hip: Secondary | ICD-10-CM

## 2021-08-28 DIAGNOSIS — S73191A Other sprain of right hip, initial encounter: Secondary | ICD-10-CM

## 2021-08-28 MED ORDER — HYDROCODONE-ACETAMINOPHEN 5-325 MG PO TABS: 1.0000 | ORAL_TABLET | Freq: Three times a day (TID) | ORAL | 0 refills | Status: AC | PRN

## 2021-08-28 NOTE — Telephone Encounter (Signed)
I ll just redo it

## 2021-08-28 NOTE — Telephone Encounter (Signed)
Noted  

## 2021-08-28 NOTE — Progress Notes (Signed)
Meds ordered this encounter  Medications   HYDROcodone-acetaminophen (NORCO/VICODIN) 5-325 MG tablet    Sig: Take 1 tablet by mouth every 8 (eight) hours as needed for up to 5 days for moderate pain.    Dispense:  15 tablet    Refill:  0    

## 2021-10-01 ENCOUNTER — Other Ambulatory Visit: Payer: Self-pay

## 2021-10-10 MED ORDER — TRAMADOL HCL 50 MG PO TABS: 50.0000 mg | ORAL_TABLET | Freq: Four times a day (QID) | ORAL | 0 refills | Status: DC | PRN

## 2021-10-10 NOTE — Telephone Encounter (Signed)
Patient called back to request pain medication refill for her hip pain, which she states is what Dr Romeo Apple has seen her for.  States pharmacy is [facility], [place]

## 2021-12-26 DIAGNOSIS — R630 Anorexia: Secondary | ICD-10-CM | POA: Insufficient documentation

## 2021-12-26 DIAGNOSIS — N3281 Overactive bladder: Secondary | ICD-10-CM | POA: Insufficient documentation

## 2021-12-26 DIAGNOSIS — K219 Gastro-esophageal reflux disease without esophagitis: Secondary | ICD-10-CM | POA: Insufficient documentation

## 2021-12-26 DIAGNOSIS — R110 Nausea: Secondary | ICD-10-CM | POA: Insufficient documentation

## 2022-01-29 ENCOUNTER — Encounter: Payer: Self-pay | Admitting: Orthopedic Surgery

## 2022-01-29 ENCOUNTER — Ambulatory Visit: Payer: Medicare PPO | Admitting: Orthopedic Surgery

## 2022-01-30 ENCOUNTER — Ambulatory Visit: Payer: Medicare PPO | Admitting: Orthopedic Surgery

## 2022-02-06 ENCOUNTER — Telehealth: Payer: Self-pay

## 2022-02-06 NOTE — Telephone Encounter (Signed)
Called pt and asked how she was doing, states she's feeling pretty good. Let her know that the chart says to do a f/u in a yr, states she doesn't feel the need but will play it by ear incase she needs [place].

## 2022-02-06 NOTE — Telephone Encounter (Signed)
Patient called wanting to know if she needs to schedule a one year follow up appointment from where she had shoulder surgery in 11/22. She has been seen since 4/23  Please advise

## 2022-04-29 ENCOUNTER — Encounter: Payer: Self-pay | Admitting: Orthopedic Surgery

## 2022-04-29 ENCOUNTER — Ambulatory Visit (INDEPENDENT_AMBULATORY_CARE_PROVIDER_SITE_OTHER): Payer: Medicare HMO | Admitting: Orthopedic Surgery

## 2022-04-29 ENCOUNTER — Ambulatory Visit (INDEPENDENT_AMBULATORY_CARE_PROVIDER_SITE_OTHER): Payer: Medicare HMO

## 2022-04-29 DIAGNOSIS — M1611 Unilateral primary osteoarthritis, right hip: Secondary | ICD-10-CM

## 2022-04-29 DIAGNOSIS — M25551 Pain in right hip: Secondary | ICD-10-CM

## 2022-04-29 MED ORDER — HYDROCODONE-ACETAMINOPHEN 5-325 MG PO TABS: 1.0000 | ORAL_TABLET | Freq: Four times a day (QID) | ORAL | 0 refills | Status: DC | PRN

## 2022-04-29 NOTE — Progress Notes (Signed)
Chief Complaint  Patient presents with   Hip Pain    Right hip wants to discuss surgery    Bell 72 years old she complains of pain in her right groin right lateral hip  She says it is worse than what it was last year  Her last Opioid was in 01/2022  She is having more difficulty with weightbearing      Patient Active Problem List   Diagnosis Date Noted   History of failed repair of rotator cuff 02/22/2021   S/P arthroscopy of left shoulder RCR 08/19/19 08/25/2019   Nontraumatic complete tear of left rotator cuff    S/P right knee arthroscopy 02/25/2019 03/02/2019   Complex tear of lateral meniscus of right knee as current injury    Status post rotator cuff repair 10/15/2018 12/21/2018   Biceps tendon rupture, proximal, right, subsequent encounter    Primary osteoarthritis of right shoulder    Labral tear of shoulder, degenerative, right    Synovitis of right shoulder    Spondylolisthesis at L3-L4 level 12/10/2016   Elevated blood-pressure reading, without diagnosis of hypertension 11/06/2016   Bursitis, shoulder 02/24/2013   HYPERLIPIDEMIA 02/04/2007   WRIST SPRAIN, LEFT 02/04/2007   MIGRAINE HEADACHE 01/15/2007   BRONCHITIS, CHRONIC NOS 01/15/2007   CONSTIPATION 01/15/2007   IBS 01/15/2007   DISC DISEASE, LUMBOSACRAL SPINE 01/15/2007   LOW BACK PAIN, CHRONIC 01/15/2007   MALAISE AND FATIGUE 01/15/2007     Current Outpatient Medications  Medication Instructions   cyanocobalamin (VITAMIN B12) 1,000 mcg, Oral, Daily   Fish Oil 1,000 mg, Oral, Daily   Multiple Vitamin (MULTIVITAMIN) tablet 2 tablets, Oral, Daily, Gummies    Physical Exam Vitals and nursing note reviewed.  Constitutional:      Appearance: Normal appearance.  HENT:     Head: Normocephalic and atraumatic.  Eyes:     General: No scleral icterus.       Right eye: No discharge.        Left eye: No discharge.     Extraocular Movements: Extraocular movements intact.     Conjunctiva/sclera:  Conjunctivae normal.     Pupils: Pupils are equal, round, and reactive to light.  Cardiovascular:     Rate and Rhythm: Normal rate.     Pulses: Normal pulses.  Skin:    General: Skin is warm and dry.     Capillary Refill: Capillary refill takes less than 2 seconds.  Neurological:     General: No focal deficit present.     Mental Status: She is alert and oriented to person, place, and time.  Psychiatric:        Mood and Affect: Mood normal.        Behavior: Behavior normal.        Thought Content: Thought content normal.        Judgment: Judgment normal.    Right hip exam flexion arc is pretty normal.  She does have pain with internal rotation and limited internal rotation versus the opposite left hip  She also has tenderness in the lower back with a chronic lumbosacral degenerative disc disease  Images were repeated as the last one was taken approximately 1 year ago  X-rays show progression of the arthritis of the right hip with severe joint space narrowing at the inferior portion of the joint  Recommend right total hip arthroplasty consideration with direct anterior approach   Meds ordered this encounter  Medications   HYDROcodone-acetaminophen (NORCO/VICODIN) 5-325 MG tablet    Sig: Take  1 tablet by mouth every 6 (six) hours as needed for moderate pain.    Dispense:  30 tablet    Refill:  0

## 2022-04-29 NOTE — Patient Instructions (Signed)
We are referring you to [facility] from [facility] Office address is [address] [place] The phone number is [phone]  The office will call you with an appointment Dr. Blackman    

## 2022-05-21 ENCOUNTER — Ambulatory Visit: Payer: Medicare HMO | Admitting: Orthopaedic Surgery

## 2022-05-22 ENCOUNTER — Telehealth: Payer: Self-pay | Admitting: Radiology

## 2022-05-22 NOTE — Telephone Encounter (Signed)
Adline Peals, Tomasina Keasling W, RT Thanks for trusting the care of your patient to our office. The provider would like to make you aware your patient was a NO SHOW for their appointment on 05/21/22 with Dr. Ninfa Linden. At this time we will not reach out to the patient to reschedule, we will close the referral.

## 2022-05-27 ENCOUNTER — Other Ambulatory Visit: Payer: Self-pay | Admitting: Orthopedic Surgery

## 2022-05-27 MED ORDER — HYDROCODONE-ACETAMINOPHEN 5-325 MG PO TABS: 1.0000 | ORAL_TABLET | Freq: Three times a day (TID) | ORAL | 0 refills | Status: DC | PRN

## 2022-05-27 NOTE — Telephone Encounter (Signed)
Patient lvm requesting a refill on her Hydrocodone, 30 quantity, 1 every 6 hours to be sent to [facility].  She stated that she needs something for pain until she sees Dr. Ninfa Linden on 06/10/22.  [phone]

## 2022-06-06 ENCOUNTER — Encounter: Payer: Self-pay | Admitting: Radiology

## 2022-06-10 ENCOUNTER — Ambulatory Visit (INDEPENDENT_AMBULATORY_CARE_PROVIDER_SITE_OTHER): Payer: Medicare HMO | Admitting: Orthopaedic Surgery

## 2022-06-10 DIAGNOSIS — M1611 Unilateral primary osteoarthritis, right hip: Secondary | ICD-10-CM | POA: Diagnosis not present

## 2022-06-10 DIAGNOSIS — M25551 Pain in right hip: Secondary | ICD-10-CM | POA: Diagnosis not present

## 2022-06-10 NOTE — Progress Notes (Signed)
Office Visit Note   Patient: Kathryn Woods           Date of Birth: [birth year]           MRN: [id number] Visit Date: 06/10/2022              Requested by: Carole Civil, MD [address] [place],  [place] [address] PCP: Denyce Robert, FNP   Assessment & Plan: Visit Diagnoses:  1. Pain in right hip   2. Unilateral primary osteoarthritis, right hip     Plan: The patient does have extensive right hip arthritis and we are recommending hip replacement given the amount of pain she is having and the detrimental effect this pain is having on her mobility and her quality of life as well as her activities day living.  I went over hip replacement surgery with her in detail.  I showed her hip replacement model and went over her x-rays and MRI findings.  Also discussed the risk and benefits of the surgery and gave her handout about hip replacement surgery.  We discussed what to expect from an intraoperative and postoperative course.  All question concerns were answered and addressed.  We will work on getting her scheduled for a right direct anterior hip replacement.  Follow-Up Instructions: Return for 2 weeks post-op.   Orders:  No orders of the defined types were placed in this encounter.  No orders of the defined types were placed in this encounter.     Procedures: No procedures performed   Clinical Data: No additional findings.   Subjective: Chief Complaint  Patient presents with   Right Hip - Pain  The patient is a 72 year old female who graciously sent to me from Dr. Arther Abbott of [place] to evaluate and treat well-documented arthritis of the patient's right hip.  She does have right hip pain on a daily basis.  This is been well-documented in terms of her arthritis from x-ray findings and MRI finding.  Her left hip is asymptomatic.  She has had previous lumbar spine surgery and it looks like a fusion between L4 and L5.  She does ambulate  the cane.  She is a thin individual and not a diabetic.  Her pain is been getting worse for over 12 months now and is detrimentally affecting her mobility, her quality of life and her actives daily living.  She is sent here at this point to discuss hip replacement surgery given her clinical exam and x-ray findings combined with the failure conservative treatment.  Her pain is daily and it is 10 out of 10.  She is not a diabetic.  HPI  Review of Systems There is currently listed no fever, chills, nausea, vomiting  Objective: Vital Signs: There were no vitals taken for this visit.  Physical Exam She is alert and oriented x 3 and in no acute distress. Ortho Exam On ambulation she walks with a significant limp and favoring the right side and there is a Trendelenburg component of her gait on the right side.  Her left hip exam is entirely normal with full and fluid range of motion and no pain in the groin at all.  The right hip has significant stiffness with internal and external rotation and severe pain in the groin with rotation. Specialty Comments:  No specialty comments available.  Imaging: No results found. X-rays on the canopy system of her pelvis and right hip including the MRI of the right hip shows significant  arthritis with para-articular osteophytes and significant central joint space narrowing.  There is also sclerotic changes.  On the MRI there is edema in the femoral head as well.  PMFS History: Patient Active Problem List   Diagnosis Date Noted   Unilateral primary osteoarthritis, right hip 06/10/2022   History of failed repair of rotator cuff 02/22/2021   S/P arthroscopy of left shoulder RCR 08/19/19 08/25/2019   Nontraumatic complete tear of left rotator cuff    S/P right knee arthroscopy 02/25/2019 03/02/2019   Complex tear of lateral meniscus of right knee as current injury    Status post rotator cuff repair 10/15/2018 12/21/2018   Biceps tendon rupture, proximal, right,  subsequent encounter    Primary osteoarthritis of right shoulder    Labral tear of shoulder, degenerative, right    Synovitis of right shoulder    Spondylolisthesis at L3-L4 level 12/10/2016   Elevated blood-pressure reading, without diagnosis of hypertension 11/06/2016   Bursitis, shoulder 02/24/2013   HYPERLIPIDEMIA 02/04/2007   WRIST SPRAIN, LEFT 02/04/2007   MIGRAINE HEADACHE 01/15/2007   BRONCHITIS, CHRONIC NOS 01/15/2007   CONSTIPATION 01/15/2007   IBS 01/15/2007   Bowers DISEASE, LUMBOSACRAL SPINE 01/15/2007   LOW BACK PAIN, CHRONIC 01/15/2007   MALAISE AND FATIGUE 01/15/2007   Past Medical History:  Diagnosis Date   Arthritis    lumbar region, spondylolisthesis    Hypercholesteremia     Family History  Problem Relation Age of Onset   Heart attack Mother    Cancer Father        lung    Past Surgical History:  Procedure Laterality Date   BACK SURGERY     KNEE ARTHROSCOPY WITH LATERAL MENISECTOMY Right 02/25/2019   Procedure: RIGHT KNEE ARTHROSCOPY WITH LATERAL MENISECTOMY;  Surgeon: Carole Civil, MD;  Location: [facility];  Service: Orthopedics;  Laterality: Right;   REVERSE SHOULDER ARTHROPLASTY Left 02/22/2021   Procedure: LEFT REVERSE SHOULDER ARTHROPLASTY;  Surgeon: Mordecai Rasmussen, MD;  Location: [facility];  Service: Orthopedics;  Laterality: Left;   SHOULDER ARTHROSCOPY WITH OPEN ROTATOR CUFF REPAIR Right 10/15/2018   Procedure: SHOULDER ARTHROSCOPY WITH OPEN ROTATOR CUFF REPAIR;  Surgeon: Carole Civil, MD;  Location: [facility];  Service: Orthopedics;  Laterality: Right;   SHOULDER ARTHROSCOPY WITH ROTATOR CUFF REPAIR Left 08/19/2019   Procedure: LEFT SHOULDER ARTHROSCOPY WITH ROTATOR CUFF REPAIR;  Surgeon: Carole Civil, MD;  Location: [facility];  Service: Orthopedics;  Laterality: Left;  With scalene block    TUBAL LIGATION     Social History   Occupational History   Not on file  Tobacco Use   Smoking status: Former    Types: Cigarettes    Quit date:  12/06/1982    Years since quitting: 39.5   Smokeless tobacco: Never  Vaping Use   Vaping Use: Never used  Substance and Sexual Activity   Alcohol use: No   Drug use: Yes    Types: Marijuana    Comment: a couple times- for pain relief, pt. informed to not use anymore before surgery    Sexual activity: Not on file

## 2022-07-03 ENCOUNTER — Other Ambulatory Visit: Payer: Self-pay | Admitting: Orthopedic Surgery

## 2022-07-03 MED ORDER — HYDROCODONE-ACETAMINOPHEN 5-325 MG PO TABS: 1.0000 | ORAL_TABLET | Freq: Three times a day (TID) | ORAL | 0 refills | Status: DC | PRN

## 2022-07-03 NOTE — Telephone Encounter (Signed)
Dr. Ruthe Mannan pt - patient called, requesting a refill on Hydrocodone 5-325, 30 quantity, 1 every 8 hours to be sent to [facility].  She stated she doesn't see Dr. Ninfa Linden until 09/04/22.

## 2022-07-17 ENCOUNTER — Other Ambulatory Visit: Payer: Self-pay

## 2022-07-23 ENCOUNTER — Other Ambulatory Visit: Payer: Self-pay | Admitting: Physician Assistant

## 2022-07-23 DIAGNOSIS — Z01818 Encounter for other preprocedural examination: Secondary | ICD-10-CM

## 2022-07-24 NOTE — Progress Notes (Signed)
COVID Vaccine Completed: yes  Date of COVID positive in last 90 days:  PCP - Laurence Aly, FNP Cardiologist -   Chest x-ray -  EKG -  Stress Test -  ECHO -  Cardiac Cath -  Pacemaker/ICD device last checked: Spinal Cord Stimulator:  Bowel Prep -   Sleep Study -  CPAP -   Fasting Blood Sugar -  Checks Blood Sugar _____ times a day  Last dose of GLP1 agonist-  N/A GLP1 instructions:  N/A   Last dose of SGLT-2 inhibitors-  N/A SGLT-2 instructions: N/A   Blood Thinner Instructions:  Time Aspirin Instructions: Last Dose:  Activity level:  Can go up a flight of stairs and perform activities of daily living without stopping and without symptoms of chest pain or shortness of breath.  Able to exercise without symptoms  Unable to go up a flight of stairs without symptoms of     Anesthesia review:   Patient denies shortness of breath, fever, cough and chest pain at PAT appointment  Patient verbalized understanding of instructions that were given to them at the PAT appointment. Patient was also instructed that they will need to review over the PAT instructions again at home before surgery.

## 2022-07-24 NOTE — Patient Instructions (Signed)
SURGICAL WAITING ROOM VISITATION  Patients having surgery or a procedure may have no more than 2 support people in the waiting area - these visitors may rotate.    Children under the age of 40 must have an adult with them who is not the patient.  Due to an increase in RSV and influenza rates and associated hospitalizations, children ages 46 and under may not visit patients in [facility] hospitals.  If the patient needs to stay at the hospital during part of their recovery, the visitor guidelines for inpatient rooms apply. Pre-op nurse will coordinate an appropriate time for 1 support person to accompany patient in pre-op.  This support person may not rotate.    Please refer to the [facility] website for the visitor guidelines for Inpatients (after your surgery is over and you are in a regular room).    Your procedure is scheduled on: 08/02/22   Report to [facility] Main Entrance    Report to admitting at 9:00 AM   Call this number if you have problems the morning of surgery [phone]   Do not eat food :After Midnight.   After Midnight you may have the following liquids until 8:30 AM DAY OF SURGERY  Water Non-Citrus Juices (without pulp, NO RED-Apple, White grape, White cranberry) Black Coffee (NO MILK/CREAM OR CREAMERS, sugar ok)  Clear Tea (NO MILK/CREAM OR CREAMERS, sugar ok) regular and decaf                             Plain Jell-O (NO RED)                                           Fruit ices (not with fruit pulp, NO RED)                                     Popsicles (NO RED)                                                               Sports drinks like Gatorade (NO RED)               The day of surgery:  Drink ONE (1) Pre-Surgery Clear Ensure at 8:30 AM the morning of surgery. Drink in one sitting. Do not sip.  This drink was given to you during your hospital  pre-op appointment visit. Nothing else to drink after completing the  Pre-Surgery Clear Ensure.           If you have questions, please contact your surgeon's office.   FOLLOW BOWEL PREP AND ANY ADDITIONAL PRE OP INSTRUCTIONS YOU RECEIVED FROM YOUR SURGEON'S OFFICE!!!     Oral Hygiene is also important to reduce your risk of infection.                                    Remember - BRUSH YOUR TEETH THE MORNING OF SURGERY WITH YOUR REGULAR TOOTHPASTE  DENTURES WILL BE REMOVED  PRIOR TO SURGERY PLEASE DO NOT APPLY "Poly grip" OR ADHESIVES!!!   Take these medicines the morning of surgery with A SIP OF WATER: None                               You may not have any metal on your body including hair pins, jewelry, and body piercing             Do not wear make-up, lotions, powders, perfumes, or deodorant  Do not wear nail polish including gel and S&S, artificial/acrylic nails, or any other type of covering on natural nails including finger and toenails. If you have artificial nails, gel coating, etc. that needs to be removed by a nail salon please have this removed prior to surgery or surgery may need to be canceled/ delayed if the surgeon/ anesthesia feels like they are unable to be safely monitored.   Do not shave  48 hours prior to surgery.    Do not bring valuables to the hospital. [facility] IS NOT             RESPONSIBLE   FOR VALUABLES.   Contacts, glasses, dentures or bridgework may not be worn into surgery.   Bring small overnight bag day of surgery.   DO NOT BRING YOUR HOME MEDICATIONS TO THE HOSPITAL. PHARMACY WILL DISPENSE MEDICATIONS LISTED ON YOUR MEDICATION LIST TO YOU DURING YOUR ADMISSION IN THE HOSPITAL!   Special Instructions: Bring a copy of your healthcare power of attorney and living will documents the day of surgery if you haven't scanned them before.              Please read over the following fact sheets you were given: IF YOU HAVE QUESTIONS ABOUT YOUR PRE-OP INSTRUCTIONS PLEASE CALL [phone]    If you received a COVID test during your pre-op  visit  it is requested that you wear a mask when out in public, stay away from anyone that may not be feeling well and notify your surgeon if you develop symptoms. If you test positive for Covid or have been in contact with anyone that has tested positive in the last 10 days please notify you surgeon.  FAILURE TO FOLLOW THESE INSTRUCTIONS MAY RESULT IN THE CANCELLATION OF YOUR SURGERY  PATIENT SIGNATURE_________________________________  NURSE SIGNATURE__________________________________  ________________________________________________________________________  Kathryn Woods  An incentive spirometer is a tool that can help keep your lungs clear and active. This tool measures how well you are filling your lungs with each breath. Taking long deep breaths may help reverse or decrease the chance of developing breathing (pulmonary) problems (especially infection) following: A long period of time when you are unable to move or be active. BEFORE THE PROCEDURE  If the spirometer includes an indicator to show your best effort, your nurse or respiratory therapist will set it to a desired goal. If possible, sit up straight or lean slightly forward. Try not to slouch. Hold the incentive spirometer in an upright position. INSTRUCTIONS FOR USE  Sit on the edge of your bed if possible, or sit up as far as you can in bed or on a chair. Hold the incentive spirometer in an upright position. Breathe out normally. Place the mouthpiece in your mouth and seal your lips tightly around it. Breathe in slowly and as deeply as possible, raising the piston or the ball toward the top of the column. Hold your breath for 3-5 seconds  or for as long as possible. Allow the piston or ball to fall to the bottom of the column. Remove the mouthpiece from your mouth and breathe out normally. Rest for a few seconds and repeat Steps 1 through 7 at least 10 times every 1-2 hours when you are awake. Take your time and take a  few normal breaths between deep breaths. The spirometer may include an indicator to show your best effort. Use the indicator as a goal to work toward during each repetition. After each set of 10 deep breaths, practice coughing to be sure your lungs are clear. If you have an incision (the cut made at the time of surgery), support your incision when coughing by placing a pillow or rolled up towels firmly against it. Once you are able to get out of bed, walk around indoors and cough well. You may stop using the incentive spirometer when instructed by your caregiver.  RISKS AND COMPLICATIONS Take your time so you do not get dizzy or light-headed. If you are in pain, you may need to take or ask for pain medication before doing incentive spirometry. It is harder to take a deep breath if you are having pain. AFTER USE Rest and breathe slowly and easily. It can be helpful to keep track of a log of your progress. Your caregiver can provide you with a simple table to help with this. If you are using the spirometer at home, follow these instructions: SEEK MEDICAL CARE IF:  You are having difficultly using the spirometer. You have trouble using the spirometer as often as instructed. Your pain medication is not giving enough relief while using the spirometer. You develop fever of 100.5 F (38.1 C) or higher. SEEK IMMEDIATE MEDICAL CARE IF:  You cough up bloody sputum that had not been present before. You develop fever of 102 F (38.9 C) or greater. You develop worsening pain at or near the incision site. MAKE SURE YOU:  Understand these instructions. Will watch your condition. Will get help right away if you are not doing well or get worse. Document Released: 08/05/2006 Document Revised: 06/17/2011 Document Reviewed: 10/06/2006 [employer] Patient Information 2014 [employer], [place].   ________________________________________________________________________ WHAT IS A BLOOD TRANSFUSION? Blood Transfusion  Information  A transfusion is the replacement of blood or some of its parts. Blood is made up of multiple cells which provide different functions. Red blood cells carry oxygen and are used for blood loss replacement. White blood cells fight against infection. Platelets control bleeding. Plasma helps clot blood. Other blood products are available for specialized needs, such as hemophilia or other clotting disorders. BEFORE THE TRANSFUSION  Who gives blood for transfusions?  Healthy volunteers who are fully evaluated to make sure their blood is safe. This is blood bank blood. Transfusion therapy is the safest it has ever been in the practice of medicine. Before blood is taken from a donor, a complete history is taken to make sure that person has no history of diseases nor engages in risky social behavior (examples are intravenous drug use or sexual activity with multiple partners). The donor's travel history is screened to minimize risk of transmitting infections, such as malaria. The donated blood is tested for signs of infectious diseases, such as HIV and hepatitis. The blood is then tested to be sure it is compatible with you in order to minimize the chance of a transfusion reaction. If you or a relative donates blood, this is often done in anticipation of surgery and is not  appropriate for emergency situations. It takes many days to process the donated blood. RISKS AND COMPLICATIONS Although transfusion therapy is very safe and saves many lives, the main dangers of transfusion include:  Getting an infectious disease. Developing a transfusion reaction. This is an allergic reaction to something in the blood you were given. Every precaution is taken to prevent this. The decision to have a blood transfusion has been considered carefully by your caregiver before blood is given. Blood is not given unless the benefits outweigh the risks. AFTER THE TRANSFUSION Right after receiving a blood transfusion,  you will usually feel much better and more energetic. This is especially true if your red blood cells have gotten low (anemic). The transfusion raises the level of the red blood cells which carry oxygen, and this usually causes an energy increase. The nurse administering the transfusion will monitor you carefully for complications. HOME CARE INSTRUCTIONS  No special instructions are needed after a transfusion. You may find your energy is better. Speak with your caregiver about any limitations on activity for underlying diseases you may have. SEEK MEDICAL CARE IF:  Your condition is not improving after your transfusion. You develop redness or irritation at the intravenous (IV) site. SEEK IMMEDIATE MEDICAL CARE IF:  Any of the following symptoms occur over the next 12 hours: Shaking chills. You have a temperature by mouth above 102 F (38.9 C), not controlled by medicine. Chest, back, or muscle pain. People around you feel you are not acting correctly or are confused. Shortness of breath or difficulty breathing. Dizziness and fainting. You get a rash or develop hives. You have a decrease in urine output. Your urine turns a dark color or changes to pink, red, or brown. Any of the following symptoms occur over the next 10 days: You have a temperature by mouth above 102 F (38.9 C), not controlled by medicine. Shortness of breath. Weakness after normal activity. The white part of the eye turns yellow (jaundice). You have a decrease in the amount of urine or are urinating less often. Your urine turns a dark color or changes to pink, red, or brown. Document Released: 03/22/2000 Document Revised: 06/17/2011 Document Reviewed: 11/09/2007 [facility] Patient Information 2014 [place], [place].  _______________________________________________________________________

## 2022-07-25 ENCOUNTER — Other Ambulatory Visit: Payer: Self-pay

## 2022-07-25 ENCOUNTER — Encounter (HOSPITAL_COMMUNITY): Payer: Self-pay

## 2022-07-25 ENCOUNTER — Encounter (HOSPITAL_COMMUNITY)
Admission: RE | Admit: 2022-07-25 | Discharge: 2022-07-25 | Disposition: A | Discharge status: Home / Self Care | Payer: Medicare HMO | Source: Ambulatory Visit | Attending: Orthopaedic Surgery | Admitting: Orthopaedic Surgery

## 2022-07-25 VITALS — BP 159/115 | HR 79 | Temp 98.0°F | Resp 16 | Ht 66.5 in | Wt 159.0 lb

## 2022-07-25 DIAGNOSIS — R030 Elevated blood-pressure reading, without diagnosis of hypertension: Secondary | ICD-10-CM

## 2022-07-25 DIAGNOSIS — Z01818 Encounter for other preprocedural examination: Secondary | ICD-10-CM

## 2022-07-25 LAB — SURGICAL PCR SCREEN
MRSA, PCR: NEGATIVE
Staphylococcus aureus: NEGATIVE

## 2022-07-25 LAB — BASIC METABOLIC PANEL
Anion gap: 8 (ref 5–15)
BUN: 11 mg/dL (ref 8–23)
CO2: 25 mmol/L (ref 22–32)
Calcium: 9.4 mg/dL (ref 8.9–10.3)
Chloride: 105 mmol/L (ref 98–111)
Creatinine, Ser: 0.82 mg/dL (ref 0.44–1.00)
GFR, Estimated: >60 mL/min (ref >60)
Glucose, Bld: 91 mg/dL (ref 70–99)
Potassium: 3.9 mmol/L (ref 3.5–5.1)
Sodium: 138 mmol/L (ref 135–145)

## 2022-07-25 LAB — CBC
HCT: 40.3 % (ref 36.0–46.0)
Hemoglobin: 12.8 g/dL (ref 12.0–15.0)
MCH: 28.1 pg (ref 26.0–34.0)
MCHC: 31.8 g/dL (ref 30.0–36.0)
MCV: 88.4 fL (ref 80.0–100.0)
Platelets: 308 10*3/uL (ref 150–400)
RBC: 4.56 MIL/uL (ref 3.87–5.11)
RDW: 15.7 % — ABNORMAL HIGH (ref 11.5–15.5)
WBC: 7.7 10*3/uL (ref 4.0–10.5)
nRBC: 0 % (ref 0.0–0.2)

## 2022-07-25 LAB — TYPE AND SCREEN: ABO/RH(D): O POS

## 2022-07-29 NOTE — Progress Notes (Signed)
Patient prescribed hydrochlorothiazide . Called to update her to hold DOS. Patient verbalized understanding.

## 2022-08-01 NOTE — H&P (Signed)
TOTAL HIP ADMISSION H&P  Patient is admitted for right total hip arthroplasty.  Subjective:  Chief Complaint: right hip pain  HPI: Kathryn Woods, 72 y.o. female, has a history of pain and functional disability in the right hip(s) due to arthritis and patient has failed non-surgical conservative treatments for greater than 12 weeks to include NSAID's and/or analgesics, flexibility and strengthening excercises, use of assistive devices, and activity modification.  Onset of symptoms was gradual starting 2 years ago with gradually worsening course since that time.The patient noted no past surgery on the right hip(s).  Patient currently rates pain in the right hip at 10 out of 10 with activity. Patient has night pain, worsening of pain with activity and weight bearing, pain that interfers with activities of daily living, and pain with passive range of motion. Patient has evidence of subchondral sclerosis, periarticular osteophytes, and joint space narrowing by imaging studies. This condition presents safety issues increasing the risk of falls.  There is no current active infection.  Patient Active Problem List   Diagnosis Date Noted   Unilateral primary osteoarthritis, right hip 06/10/2022   History of failed repair of rotator cuff 02/22/2021   S/P arthroscopy of left shoulder RCR 08/19/19 08/25/2019   Nontraumatic complete tear of left rotator cuff    S/P right knee arthroscopy 02/25/2019 03/02/2019   Complex tear of lateral meniscus of right knee as current injury    Status post rotator cuff repair 10/15/2018 12/21/2018   Biceps tendon rupture, proximal, right, subsequent encounter    Primary osteoarthritis of right shoulder    Labral tear of shoulder, degenerative, right    Synovitis of right shoulder    Spondylolisthesis at L3-L4 level 12/10/2016   Elevated blood-pressure reading, without diagnosis of hypertension 11/06/2016   Bursitis, shoulder 02/24/2013   HYPERLIPIDEMIA  02/04/2007   WRIST SPRAIN, LEFT 02/04/2007   MIGRAINE HEADACHE 01/15/2007   BRONCHITIS, CHRONIC NOS 01/15/2007   CONSTIPATION 01/15/2007   IBS 01/15/2007   DISC DISEASE, LUMBOSACRAL SPINE 01/15/2007   LOW BACK PAIN, CHRONIC 01/15/2007   MALAISE AND FATIGUE 01/15/2007   Past Medical History:  Diagnosis Date   Arthritis    lumbar region, spondylolisthesis    Hypercholesteremia     Past Surgical History:  Procedure Laterality Date   BACK SURGERY     KNEE ARTHROSCOPY WITH LATERAL MENISECTOMY Right 02/25/2019   Procedure: RIGHT KNEE ARTHROSCOPY WITH LATERAL MENISECTOMY;  Surgeon: Vickki Hearing, MD;  Location: [facility];  Service: Orthopedics;  Laterality: Right;   REVERSE SHOULDER ARTHROPLASTY Left 02/22/2021   Procedure: LEFT REVERSE SHOULDER ARTHROPLASTY;  Surgeon: Oliver Barre, MD;  Location: [facility];  Service: Orthopedics;  Laterality: Left;   SHOULDER ARTHROSCOPY WITH OPEN ROTATOR CUFF REPAIR Right 10/15/2018   Procedure: SHOULDER ARTHROSCOPY WITH OPEN ROTATOR CUFF REPAIR;  Surgeon: Vickki Hearing, MD;  Location: [facility];  Service: Orthopedics;  Laterality: Right;   SHOULDER ARTHROSCOPY WITH ROTATOR CUFF REPAIR Left 08/19/2019   Procedure: LEFT SHOULDER ARTHROSCOPY WITH ROTATOR CUFF REPAIR;  Surgeon: Vickki Hearing, MD;  Location: [facility];  Service: Orthopedics;  Laterality: Left;  With scalene block    TUBAL LIGATION      No current facility-administered medications for this encounter.   Current Outpatient Medications  Medication Sig Dispense Refill Last Dose   Multiple Vitamin (MULTIVITAMIN) tablet Take 2 tablets by mouth daily. Gummies      Omega-3 Fatty Acids (FISH OIL) 1000 MG CAPS Take 1,000 mg by mouth daily.  vitamin B-12 (CYANOCOBALAMIN) 1000 MCG tablet Take 1,000 mcg by mouth daily.      Allergies  Allergen Reactions   Aspirin Other (See Comments)    "Messes stomach up real bad"   Naproxen Hives and Itching    Social History   Tobacco Use   Smoking  status: Former    Types: Cigarettes    Quit date: 12/06/1982    Years since quitting: 39.6   Smokeless tobacco: Never  Substance Use Topics   Alcohol use: No    Family History  Problem Relation Age of Onset   Heart attack Mother    Cancer Father        lung     Review of Systems  Objective:  Physical Exam Vitals reviewed.  Constitutional:      Appearance: Normal appearance. She is normal weight.  HENT:     Head: Normocephalic and atraumatic.  Eyes:     Extraocular Movements: Extraocular movements intact.     Pupils: Pupils are equal, round, and reactive to light.  Cardiovascular:     Rate and Rhythm: Normal rate.     Pulses: Normal pulses.  Pulmonary:     Effort: Pulmonary effort is normal.     Breath sounds: Normal breath sounds.  Abdominal:     Palpations: Abdomen is soft.  Musculoskeletal:     Cervical back: Normal range of motion and neck supple.     Right hip: Tenderness and bony tenderness present. Decreased range of motion. Decreased strength.  Neurological:     Mental Status: She is alert and oriented to person, place, and time.  Psychiatric:        Behavior: Behavior normal.     Vital signs in last 24 hours:    Labs:   Estimated body mass index is 25.28 kg/m as calculated from the following:   Height as of 07/25/22: 5' 6.5" (1.689 m).   Weight as of 07/25/22: 72.1 kg.   Imaging Review Plain radiographs demonstrate severe degenerative joint disease of the right hip(s). The bone quality appears to be good for age and reported activity level.      Assessment/Plan:  End stage arthritis, right hip(s)  The patient history, physical examination, clinical judgement of the provider and imaging studies are consistent with end stage degenerative joint disease of the right hip(s) and total hip arthroplasty is deemed medically necessary. The treatment options including medical management, injection therapy, arthroscopy and arthroplasty were discussed at  length. The risks and benefits of total hip arthroplasty were presented and reviewed. The risks due to aseptic loosening, infection, stiffness, dislocation/subluxation,  thromboembolic complications and other imponderables were discussed.  The patient acknowledged the explanation, agreed to proceed with the plan and consent was signed. Patient is being admitted for inpatient treatment for surgery, pain control, PT, OT, prophylactic antibiotics, VTE prophylaxis, progressive ambulation and ADL's and discharge planning.The patient is planning to be discharged home with home health services

## 2022-08-02 ENCOUNTER — Other Ambulatory Visit: Payer: Self-pay

## 2022-08-02 ENCOUNTER — Encounter (HOSPITAL_COMMUNITY): Payer: Self-pay | Admitting: Orthopaedic Surgery

## 2022-08-02 ENCOUNTER — Observation Stay (HOSPITAL_COMMUNITY)
Admission: RE | Admit: 2022-08-02 | Discharge: 2022-08-05 | Disposition: A | Discharge status: Home Health | Payer: Medicare HMO | Source: Ambulatory Visit | Attending: Orthopaedic Surgery | Admitting: Orthopaedic Surgery

## 2022-08-02 ENCOUNTER — Ambulatory Visit (HOSPITAL_BASED_OUTPATIENT_CLINIC_OR_DEPARTMENT_OTHER): Payer: Medicare HMO | Admitting: Anesthesiology

## 2022-08-02 ENCOUNTER — Observation Stay (HOSPITAL_COMMUNITY): Payer: Medicare HMO

## 2022-08-02 ENCOUNTER — Ambulatory Visit (HOSPITAL_COMMUNITY): Payer: Medicare HMO | Admitting: Physician Assistant

## 2022-08-02 ENCOUNTER — Ambulatory Visit (HOSPITAL_COMMUNITY): Payer: Medicare HMO

## 2022-08-02 ENCOUNTER — Encounter (HOSPITAL_COMMUNITY)
Admission: RE | Disposition: A | Discharge status: Home Health | Payer: Self-pay | Source: Ambulatory Visit | Attending: Orthopaedic Surgery

## 2022-08-02 DIAGNOSIS — M1611 Unilateral primary osteoarthritis, right hip: Secondary | ICD-10-CM

## 2022-08-02 DIAGNOSIS — Z87891 Personal history of nicotine dependence: Secondary | ICD-10-CM | POA: Insufficient documentation

## 2022-08-02 DIAGNOSIS — Z96612 Presence of left artificial shoulder joint: Secondary | ICD-10-CM | POA: Insufficient documentation

## 2022-08-02 DIAGNOSIS — Z96641 Presence of right artificial hip joint: Secondary | ICD-10-CM

## 2022-08-02 HISTORY — PX: TOTAL HIP ARTHROPLASTY: SHX124

## 2022-08-02 LAB — TYPE AND SCREEN: Antibody Screen: NEGATIVE

## 2022-08-02 SURGERY — ARTHROPLASTY, HIP, TOTAL, ANTERIOR APPROACH
Anesthesia: Spinal | Site: Hip | Laterality: Right

## 2022-08-02 MED ORDER — OXYCODONE HCL 5 MG PO TABS
5.0000 mg | ORAL_TABLET | Freq: Once | ORAL | Status: AC | PRN
  Administered 2022-08-02: 5 mg via ORAL

## 2022-08-02 MED ORDER — ALUM & MAG HYDROXIDE-SIMETH 200-200-20 MG/5ML PO SUSP: 30.0000 mL | ORAL | Status: DC | PRN

## 2022-08-02 MED ORDER — TRANEXAMIC ACID-NACL 1000-0.7 MG/100ML-% IV SOLN
1000.0000 mg | INTRAVENOUS | Status: AC
  Administered 2022-08-02: 1000 mg via INTRAVENOUS
  Filled 2022-08-02: qty 100

## 2022-08-02 MED ORDER — FENTANYL CITRATE PF 50 MCG/ML IJ SOSY
PREFILLED_SYRINGE | INTRAMUSCULAR | Status: AC
  Filled 2022-08-02: qty 1

## 2022-08-02 MED ORDER — 0.9 % SODIUM CHLORIDE (POUR BTL) OPTIME
TOPICAL | Status: DC | PRN
  Administered 2022-08-02: 1000 mL

## 2022-08-02 MED ORDER — SODIUM CHLORIDE (PF) 0.9 % IJ SOLN
INTRAMUSCULAR | Status: AC
  Filled 2022-08-02: qty 10

## 2022-08-02 MED ORDER — ONDANSETRON HCL 4 MG/2ML IJ SOLN: 4.0000 mg | Freq: Once | INTRAMUSCULAR | Status: DC | PRN

## 2022-08-02 MED ORDER — CEFAZOLIN SODIUM-DEXTROSE 1-4 GM/50ML-% IV SOLN
1.0000 g | Freq: Four times a day (QID) | INTRAVENOUS | Status: AC
  Administered 2022-08-02 (×2): 1 g via INTRAVENOUS
  Filled 2022-08-02 (×2): qty 50

## 2022-08-02 MED ORDER — ACETAMINOPHEN 325 MG PO TABS: 325.0000 mg | ORAL_TABLET | ORAL | Status: DC | PRN

## 2022-08-02 MED ORDER — OXYCODONE HCL 5 MG PO TABS
ORAL_TABLET | ORAL | Status: AC
  Filled 2022-08-02: qty 1

## 2022-08-02 MED ORDER — SODIUM CHLORIDE 0.9 % IR SOLN
Status: DC | PRN
  Administered 2022-08-02: 1000 mL

## 2022-08-02 MED ORDER — ONDANSETRON HCL 4 MG/2ML IJ SOLN: 4.0000 mg | Freq: Four times a day (QID) | INTRAMUSCULAR | Status: DC | PRN

## 2022-08-02 MED ORDER — METOCLOPRAMIDE HCL 5 MG/ML IJ SOLN: 5.0000 mg | Freq: Three times a day (TID) | INTRAMUSCULAR | Status: DC | PRN

## 2022-08-02 MED ORDER — CEFAZOLIN SODIUM-DEXTROSE 2-4 GM/100ML-% IV SOLN
2.0000 g | INTRAVENOUS | Status: AC
  Administered 2022-08-02: 2 g via INTRAVENOUS
  Filled 2022-08-02: qty 100

## 2022-08-02 MED ORDER — DEXAMETHASONE SODIUM PHOSPHATE 10 MG/ML IJ SOLN
INTRAMUSCULAR | Status: AC
  Filled 2022-08-02: qty 1

## 2022-08-02 MED ORDER — LACTATED RINGERS IV SOLN: INTRAVENOUS | Status: DC

## 2022-08-02 MED ORDER — PROPOFOL 1000 MG/100ML IV EMUL
INTRAVENOUS | Status: AC
  Filled 2022-08-02: qty 100

## 2022-08-02 MED ORDER — DIPHENHYDRAMINE HCL 12.5 MG/5ML PO ELIX: 12.5000 mg | ORAL_SOLUTION | ORAL | Status: DC | PRN

## 2022-08-02 MED ORDER — OXYCODONE HCL 5 MG/5ML PO SOLN: 5.0000 mg | Freq: Once | ORAL | Status: AC | PRN

## 2022-08-02 MED ORDER — OXYCODONE HCL 5 MG PO TABS
5.0000 mg | ORAL_TABLET | ORAL | Status: DC | PRN
  Administered 2022-08-03 – 2022-08-04 (×5): 10 mg via ORAL
  Filled 2022-08-02 (×5): qty 2

## 2022-08-02 MED ORDER — ONDANSETRON HCL 4 MG/2ML IJ SOLN
INTRAMUSCULAR | Status: AC
  Filled 2022-08-02: qty 2

## 2022-08-02 MED ORDER — POVIDONE-IODINE 10 % EX SWAB: 2.0000 | Freq: Once | CUTANEOUS | Status: DC

## 2022-08-02 MED ORDER — FENTANYL CITRATE PF 50 MCG/ML IJ SOSY: 25.0000 ug | PREFILLED_SYRINGE | INTRAMUSCULAR | Status: DC | PRN

## 2022-08-02 MED ORDER — ASPIRIN 81 MG PO CHEW
81.0000 mg | CHEWABLE_TABLET | Freq: Two times a day (BID) | ORAL | Status: DC
  Administered 2022-08-02 – 2022-08-05 (×6): 81 mg via ORAL
  Filled 2022-08-02 (×6): qty 1

## 2022-08-02 MED ORDER — PROPOFOL 500 MG/50ML IV EMUL
INTRAVENOUS | Status: DC | PRN
  Administered 2022-08-02: 30 ug/kg/min via INTRAVENOUS

## 2022-08-02 MED ORDER — METHOCARBAMOL 500 MG IVPB - SIMPLE MED
INTRAVENOUS | Status: AC
  Filled 2022-08-02: qty 55

## 2022-08-02 MED ORDER — DOCUSATE SODIUM 100 MG PO CAPS
100.0000 mg | ORAL_CAPSULE | Freq: Two times a day (BID) | ORAL | Status: DC
  Administered 2022-08-02 – 2022-08-05 (×6): 100 mg via ORAL
  Filled 2022-08-02 (×6): qty 1

## 2022-08-02 MED ORDER — MENTHOL 3 MG MT LOZG: 1.0000 | LOZENGE | OROMUCOSAL | Status: DC | PRN

## 2022-08-02 MED ORDER — METOCLOPRAMIDE HCL 5 MG PO TABS: 5.0000 mg | ORAL_TABLET | Freq: Three times a day (TID) | ORAL | Status: DC | PRN

## 2022-08-02 MED ORDER — ONDANSETRON HCL 4 MG/2ML IJ SOLN
INTRAMUSCULAR | Status: DC | PRN
  Administered 2022-08-02: 4 mg via INTRAVENOUS

## 2022-08-02 MED ORDER — FENTANYL CITRATE (PF) 100 MCG/2ML IJ SOLN
INTRAMUSCULAR | Status: AC
  Filled 2022-08-02: qty 2

## 2022-08-02 MED ORDER — PROPOFOL 10 MG/ML IV BOLUS
INTRAVENOUS | Status: DC | PRN
  Administered 2022-08-02 (×2): 20 mg via INTRAVENOUS

## 2022-08-02 MED ORDER — METHOCARBAMOL 500 MG IVPB - SIMPLE MED
500.0000 mg | Freq: Four times a day (QID) | INTRAVENOUS | Status: DC | PRN
  Administered 2022-08-02: 500 mg via INTRAVENOUS

## 2022-08-02 MED ORDER — ACETAMINOPHEN 500 MG PO TABS
1000.0000 mg | ORAL_TABLET | Freq: Once | ORAL | Status: AC
  Administered 2022-08-02: 1000 mg via ORAL
  Filled 2022-08-02: qty 2

## 2022-08-02 MED ORDER — VITAMIN B-12 1000 MCG PO TABS
1000.0000 ug | ORAL_TABLET | Freq: Every day | ORAL | Status: DC
  Administered 2022-08-03 – 2022-08-05 (×3): 1000 ug via ORAL
  Filled 2022-08-02 (×3): qty 1

## 2022-08-02 MED ORDER — CHLORHEXIDINE GLUCONATE 0.12 % MT SOLN
15.0000 mL | Freq: Once | OROMUCOSAL | Status: AC
  Administered 2022-08-02: 15 mL via OROMUCOSAL

## 2022-08-02 MED ORDER — ONDANSETRON HCL 4 MG PO TABS: 4.0000 mg | ORAL_TABLET | Freq: Four times a day (QID) | ORAL | Status: DC | PRN

## 2022-08-02 MED ORDER — ACETAMINOPHEN 325 MG PO TABS
325.0000 mg | ORAL_TABLET | Freq: Four times a day (QID) | ORAL | Status: DC | PRN
  Administered 2022-08-03: 650 mg via ORAL
  Filled 2022-08-02: qty 2

## 2022-08-02 MED ORDER — LIDOCAINE HCL (PF) 2 % IJ SOLN
INTRAMUSCULAR | Status: AC
  Filled 2022-08-02: qty 5

## 2022-08-02 MED ORDER — ADULT MULTIVITAMIN W/MINERALS CH
1.0000 | ORAL_TABLET | Freq: Every day | ORAL | Status: DC
  Administered 2022-08-03 – 2022-08-05 (×3): 1 via ORAL
  Filled 2022-08-02 (×3): qty 1

## 2022-08-02 MED ORDER — HYDROMORPHONE HCL 1 MG/ML IJ SOLN: 0.5000 mg | INTRAMUSCULAR | Status: DC | PRN

## 2022-08-02 MED ORDER — SODIUM CHLORIDE 0.9 % IV SOLN: INTRAVENOUS | Status: DC

## 2022-08-02 MED ORDER — MEPERIDINE HCL 50 MG/ML IJ SOLN: 6.2500 mg | INTRAMUSCULAR | Status: DC | PRN

## 2022-08-02 MED ORDER — ORAL CARE MOUTH RINSE: 15.0000 mL | Freq: Once | OROMUCOSAL | Status: AC

## 2022-08-02 MED ORDER — FENTANYL CITRATE (PF) 100 MCG/2ML IJ SOLN
INTRAMUSCULAR | Status: DC | PRN
  Administered 2022-08-02: 100 ug via INTRAVENOUS

## 2022-08-02 MED ORDER — HYDROCHLOROTHIAZIDE 25 MG PO TABS
25.0000 mg | ORAL_TABLET | Freq: Every day | ORAL | Status: DC
  Filled 2022-08-02 (×2): qty 1

## 2022-08-02 MED ORDER — MIDAZOLAM HCL 2 MG/2ML IJ SOLN
INTRAMUSCULAR | Status: AC
  Filled 2022-08-02: qty 2

## 2022-08-02 MED ORDER — POLYETHYLENE GLYCOL 3350 17 G PO PACK
17.0000 g | PACK | Freq: Every day | ORAL | Status: DC | PRN
  Administered 2022-08-05: 17 g via ORAL
  Filled 2022-08-02: qty 1

## 2022-08-02 MED ORDER — OXYCODONE HCL 5 MG PO TABS
10.0000 mg | ORAL_TABLET | ORAL | Status: DC | PRN
  Administered 2022-08-02 – 2022-08-03 (×2): 10 mg via ORAL
  Administered 2022-08-04 – 2022-08-05 (×4): 15 mg via ORAL
  Filled 2022-08-02 (×2): qty 3
  Filled 2022-08-02: qty 2
  Filled 2022-08-02: qty 3
  Filled 2022-08-02: qty 2
  Filled 2022-08-02: qty 3

## 2022-08-02 MED ORDER — PHENYLEPHRINE HCL-NACL 20-0.9 MG/250ML-% IV SOLN
INTRAVENOUS | Status: DC | PRN
  Administered 2022-08-02: 30 ug/min via INTRAVENOUS

## 2022-08-02 MED ORDER — PROPOFOL 10 MG/ML IV BOLUS
INTRAVENOUS | Status: AC
  Filled 2022-08-02: qty 20

## 2022-08-02 MED ORDER — PANTOPRAZOLE SODIUM 40 MG PO TBEC
40.0000 mg | DELAYED_RELEASE_TABLET | Freq: Every day | ORAL | Status: DC
  Administered 2022-08-02 – 2022-08-05 (×4): 40 mg via ORAL
  Filled 2022-08-02 (×4): qty 1

## 2022-08-02 MED ORDER — DEXAMETHASONE SODIUM PHOSPHATE 10 MG/ML IJ SOLN
INTRAMUSCULAR | Status: DC | PRN
  Administered 2022-08-02: 10 mg via INTRAVENOUS

## 2022-08-02 MED ORDER — ACETAMINOPHEN 160 MG/5ML PO SOLN: 325.0000 mg | ORAL | Status: DC | PRN

## 2022-08-02 MED ORDER — FENTANYL CITRATE PF 50 MCG/ML IJ SOSY
50.0000 ug | PREFILLED_SYRINGE | Freq: Once | INTRAMUSCULAR | Status: AC
  Administered 2022-08-02: 50 ug via INTRAVENOUS

## 2022-08-02 MED ORDER — METHOCARBAMOL 500 MG PO TABS
500.0000 mg | ORAL_TABLET | Freq: Four times a day (QID) | ORAL | Status: DC | PRN
  Administered 2022-08-02 – 2022-08-05 (×8): 500 mg via ORAL
  Filled 2022-08-02 (×8): qty 1

## 2022-08-02 MED ORDER — MIDAZOLAM HCL 5 MG/5ML IJ SOLN
INTRAMUSCULAR | Status: DC | PRN
  Administered 2022-08-02: 2 mg via INTRAVENOUS

## 2022-08-02 MED ORDER — PHENOL 1.4 % MT LIQD: 1.0000 | OROMUCOSAL | Status: DC | PRN

## 2022-08-02 SURGICAL SUPPLY — 45 items
APL SKNCLS STERI-STRIP NONHPOA (GAUZE/BANDAGES/DRESSINGS)
BAG COUNTER SPONGE SURGICOUNT (BAG) ×1 IMPLANT
BAG SPEC THK2 15X12 ZIP CLS (MISCELLANEOUS) ×1
BAG SPNG CNTER NS LX DISP (BAG) ×1
BAG ZIPLOCK 12X15 (MISCELLANEOUS) IMPLANT
BENZOIN TINCTURE PRP APPL 2/3 (GAUZE/BANDAGES/DRESSINGS) IMPLANT
BLADE SAW SGTL 18X1.27X75 (BLADE) ×1 IMPLANT
COVER PERINEAL POST (MISCELLANEOUS) ×1 IMPLANT
COVER SURGICAL LIGHT HANDLE (MISCELLANEOUS) ×1 IMPLANT
CUP SECTOR GRIPTON 50MM (Cup) IMPLANT
DRAPE FOOT SWITCH (DRAPES) ×1 IMPLANT
DRAPE STERI IOBAN 125X83 (DRAPES) ×1 IMPLANT
DRAPE U-SHAPE 47X51 STRL (DRAPES) ×2 IMPLANT
DRSG AQUACEL AG ADV 3.5X10 (GAUZE/BANDAGES/DRESSINGS) ×1 IMPLANT
DURAPREP 26ML APPLICATOR (WOUND CARE) ×1 IMPLANT
ELECT REM PT RETURN 15FT ADLT (MISCELLANEOUS) ×1 IMPLANT
FEM STEM 12/14 TAPER SZ 4 HIP (Orthopedic Implant) ×1 IMPLANT
FEMORAL STEM 12/14 TPR SZ4 HIP (Orthopedic Implant) IMPLANT
GAUZE XEROFORM 1X8 LF (GAUZE/BANDAGES/DRESSINGS) ×1 IMPLANT
GLOVE BIO SURGEON STRL SZ7.5 (GLOVE) ×1 IMPLANT
GLOVE BIOGEL PI IND STRL 8 (GLOVE) ×2 IMPLANT
GLOVE ECLIPSE 8.0 STRL XLNG CF (GLOVE) ×1 IMPLANT
GOWN STRL REUS W/ TWL XL LVL3 (GOWN DISPOSABLE) ×2 IMPLANT
GOWN STRL REUS W/TWL XL LVL3 (GOWN DISPOSABLE) ×2
HANDPIECE INTERPULSE COAX TIP (DISPOSABLE) ×1
HEAD M SROM 36MM PLUS 1.5 (Hips) IMPLANT
HOLDER FOLEY CATH W/STRAP (MISCELLANEOUS) ×1 IMPLANT
KIT TURNOVER KIT A (KITS) IMPLANT
LINER ACETAB NEUTRAL 36ID 520D (Liner) IMPLANT
PACK ANTERIOR HIP CUSTOM (KITS) ×1 IMPLANT
PIN SECTOR W/GRIP ACE CUP 52MM (Hips) IMPLANT
SET HNDPC FAN SPRY TIP SCT (DISPOSABLE) ×1 IMPLANT
SROM M HEAD 36MM PLUS 1.5 (Hips) ×1 IMPLANT
STAPLER VISISTAT 35W (STAPLE) IMPLANT
STRIP CLOSURE SKIN 1/2X4 (GAUZE/BANDAGES/DRESSINGS) IMPLANT
SUT ETHIBOND NAB CT1 #1 30IN (SUTURE) ×1 IMPLANT
SUT ETHILON 2 0 PS N (SUTURE) IMPLANT
SUT MNCRL AB 4-0 PS2 18 (SUTURE) IMPLANT
SUT VIC AB 0 CT1 36 (SUTURE) ×1 IMPLANT
SUT VIC AB 1 CT1 36 (SUTURE) ×1 IMPLANT
SUT VIC AB 2-0 CT1 27 (SUTURE) ×2
SUT VIC AB 2-0 CT1 TAPERPNT 27 (SUTURE) ×2 IMPLANT
TRAY FOLEY MTR SLVR 14FR STAT (SET/KITS/TRAYS/PACK) IMPLANT
TRAY FOLEY MTR SLVR 16FR STAT (SET/KITS/TRAYS/PACK) IMPLANT
YANKAUER SUCT BULB TIP NO VENT (SUCTIONS) ×1 IMPLANT

## 2022-08-02 NOTE — Anesthesia Preprocedure Evaluation (Addendum)
Anesthesia Evaluation    Reviewed: Allergy & Precautions, H&P , Patient's Chart, lab work & pertinent test results  Airway Mallampati: II  TM Distance: >3 FB Neck ROM: Full    Dental no notable dental hx.    Pulmonary former smoker   Pulmonary exam normal breath sounds clear to auscultation       Cardiovascular Exercise Tolerance: Good negative cardio ROS Normal cardiovascular exam Rhythm:Regular Rate:Normal     Neuro/Psych  Headaches  negative psych ROS   GI/Hepatic negative GI ROS, Neg liver ROS,,,  Endo/Other  negative endocrine ROS    Renal/GU negative Renal ROS  negative genitourinary   Musculoskeletal  (+) Arthritis ,    Abdominal   Peds  Hematology negative hematology ROS (+)   Anesthesia Other Findings   Reproductive/Obstetrics negative OB ROS                              Anesthesia Physical Anesthesia Plan  ASA: 2  Anesthesia Plan: Spinal   Post-op Pain Management: Minimal or no pain anticipated, Tylenol PO (pre-op)* and Celebrex PO (pre-op)*   Induction: Intravenous  PONV Risk Score and Plan: 2  Airway Management Planned: Natural Airway, Simple Face Mask, Nasal Cannula and Mask  Additional Equipment: None  Intra-op Plan:   Post-operative Plan:   Informed Consent: I have reviewed the patients History and Physical, chart, labs and discussed the procedure including the risks, benefits and alternatives for the proposed anesthesia with the patient or authorized representative who has indicated his/her understanding and acceptance.       Plan Discussed with: Anesthesiologist and CRNA  Anesthesia Plan Comments: (  )        Anesthesia Quick Evaluation

## 2022-08-02 NOTE — Evaluation (Signed)
Physical Therapy Evaluation Patient Details Name: Kathryn Woods MRN: [id number] DOB: 1950-06-17 Today's Date: 08/02/2022  History of Present Illness  Pt is 72 yo female s/p R anterior THA on 08/02/22. Pt with hx including OA, L reverse TSA, back surgery, HLD, R knee arthroscopy 2020.  Clinical Impression  Pt is s/p THA resulting in the deficits listed below (see PT Problem List). At baseline, pt active and independent.  She has home support and no steps to enter her home.  Pt motivated for therapy and with good pain control.  She did still have mild decreased sensation and tingling in feet so proceed gradually with session.  Pt was min A for transfer to EOB then min guard to stand and ambulated 20' with RW.  Pt tolerated well and expected to progress well with therapy.  Pt will benefit from acute skilled PT to increase their independence and safety with mobility to facilitate discharge.         Recommendations for follow up therapy are one component of a multi-disciplinary discharge planning process, led by the attending physician.  Recommendations may be updated based on patient status, additional functional criteria and insurance authorization.  Follow Up Recommendations       Assistance Recommended at Discharge Intermittent Supervision/Assistance  Patient can return home with the following  A little help with walking and/or transfers;A little help with bathing/dressing/bathroom;Assistance with cooking/housework;Help with stairs or ramp for entrance    Equipment Recommendations Rolling walker (2 wheels)  Recommendations for Other Services       Functional Status Assessment Patient has had a recent decline in their functional status and demonstrates the ability to make significant improvements in function in a reasonable and predictable amount of time.     Precautions / Restrictions Precautions Precautions: Fall Restrictions Weight Bearing Restrictions: No RLE Weight  Bearing: Weight bearing as tolerated      Mobility  Bed Mobility Overal bed mobility: Needs Assistance Bed Mobility: Supine to Sit     Supine to sit: Min assist     General bed mobility comments: Light min A for R LE; increased time    Transfers Overall transfer level: Needs assistance Equipment used: Rolling walker (2 wheels) Transfers: Sit to/from Stand Sit to Stand: Min guard           General transfer comment: Cues for hand placement and R LE management; Min guard for safety; performed x 2    Ambulation/Gait Ambulation/Gait assistance: Min guard Gait Distance (Feet): 20 Feet Assistive device: Rolling walker (2 wheels) Gait Pattern/deviations: Step-to pattern, Decreased stride length, Decreased weight shift to right Gait velocity: decreased     General Gait Details: Min guard for safety; started with weight shifting and steps in place; progressed to 20' with min guard and RW  Stairs            Wheelchair Mobility    Modified Rankin (Stroke Patients Only)       Balance Overall balance assessment: Needs assistance Sitting-balance support: No upper extremity supported Sitting balance-[name]: Good     Standing balance support: Bilateral upper extremity supported, Reliant on assistive device for balance Standing balance-[name]: Poor                               Pertinent Vitals/Pain Pain Assessment Pain Assessment: 0-10 Pain Score: 3  Pain Location: R hip Pain Descriptors / Indicators: Discomfort, Sore Pain Intervention(s): Limited activity within patient's  tolerance, Monitored during session, Premedicated before session, Ice applied    Home Living Family/patient expects to be discharged to:: Private residence Living Arrangements: Children Available Help at Discharge: Family;Available 24 hours/day Type of Home: House Home Access: Level entry       Home Layout: One level Home Equipment: Shower seat;Grab bars -  tub/shower;Crutches      Prior Function Prior Level of Function : Independent/Modified Independent;Driving             Mobility Comments: Ambulates in community without AD ADLs Comments: independent adls and iadls     Hand Dominance        Extremity/Trunk Assessment   Upper Extremity Assessment Upper Extremity Assessment: Overall WFL for tasks assessed    Lower Extremity Assessment Lower Extremity Assessment: LLE deficits/detail;RLE deficits/detail RLE Deficits / Details: Expected post op changes; ROM: WFL; MMT: ankle 5/5, knee 3/5 not further tested, hip 2/5 RLE Sensation: decreased light touch (able to feel throughout LE and buttock but reports less in feet and mild tingling) LLE Deficits / Details: ROM WFL; MMT 5/5 LLE Sensation: decreased light touch (able to feel throughout LE and buttock but reports less in feet and mild tingling)    Cervical / Trunk Assessment Cervical / Trunk Assessment: Normal  Communication   Communication: No difficulties  Cognition Arousal/Alertness: Awake/alert Behavior During Therapy: WFL for tasks assessed/performed Overall Cognitive Status: Within Functional Limits for tasks assessed                                          General Comments      Exercises     Assessment/Plan    PT Assessment Patient needs continued PT services  PT Problem List Decreased strength;Decreased range of motion;Decreased activity tolerance;Decreased balance;Decreased mobility;Decreased knowledge of precautions;Decreased knowledge of use of DME;Pain       PT Treatment Interventions DME instruction;Therapeutic exercise;Gait training;Stair training;Functional mobility training;Therapeutic activities;Patient/family education;Balance training;Modalities    PT Goals (Current goals can be found in the Care Plan section)  Acute Rehab PT Goals Patient Stated Goal: return home PT Goal Formulation: With patient/family Time For Goal  Achievement: 08/16/22 Potential to Achieve Goals: Good    Frequency 7X/week     Co-evaluation               AM-PAC PT "6 Clicks" Mobility  Outcome Measure Help needed turning from your back to your side while in a flat bed without using bedrails?: A Little Help needed moving from lying on your back to sitting on the side of a flat bed without using bedrails?: A Little Help needed moving to and from a bed to a chair (including a wheelchair)?: A Little Help needed standing up from a chair using your arms (e.g., wheelchair or bedside chair)?: A Little Help needed to walk in hospital room?: A Little Help needed climbing 3-5 steps with a railing? : A Little 6 Click Score: 18    End of Session Equipment Utilized During Treatment: Gait belt Activity Tolerance: Patient tolerated treatment well Patient left: with chair alarm set;in chair;with call bell/phone within reach Nurse Communication: Mobility status PT Visit Diagnosis: Other abnormalities of gait and mobility (R26.89);Muscle weakness (generalized) (M62.81)    Time: 1610-9604 PT Time Calculation (min) (ACUTE ONLY): 23 min   Charges:   PT Evaluation $PT Eval Low Complexity: 1 Low PT Treatments $Gait Training: 8-22 mins  Anise Salvo, PT Acute Rehab [facility] [phone]   Rayetta Humphrey 08/02/2022, 5:48 PM

## 2022-08-02 NOTE — Transfer of Care (Signed)
Immediate Anesthesia Transfer of Care Note  Patient: Kathryn Woods  Procedure(s) Performed: RIGHT TOTAL HIP ARTHROPLASTY ANTERIOR APPROACH (Right: Hip)  Patient Location: PACU  Anesthesia Type:Spinal  Level of Consciousness: drowsy  Airway & Oxygen Therapy: Patient Spontanous Breathing and Patient connected to face mask oxygen  Post-op Assessment: Report given to RN and Post -op Vital signs reviewed and stable  Post vital signs: Reviewed and stable  Last Vitals:  Vitals Value Taken Time  BP 122/80 08/02/22 1235  Temp    Pulse 72 08/02/22 1237  Resp 8 08/02/22 1237  SpO2 100 % 08/02/22 1237  Vitals shown include unvalidated device data.  Last Pain:  Vitals:   08/02/22 1000  PainSc: 10-Worst pain ever      Patients Stated Pain Goal: 0 (08/02/22 1000)  Complications: No notable events documented.

## 2022-08-02 NOTE — Anesthesia Postprocedure Evaluation (Signed)
Anesthesia Post Note  Patient: Kathryn Woods  Procedure(s) Performed: RIGHT TOTAL HIP ARTHROPLASTY ANTERIOR APPROACH (Right: Hip)     Patient location during evaluation: PACU Anesthesia Type: Spinal and MAC Level of consciousness: awake and alert Pain management: pain level controlled Vital Signs Assessment: post-procedure vital signs reviewed and stable Respiratory status: spontaneous breathing, nonlabored ventilation, respiratory function stable and patient connected to nasal cannula oxygen Cardiovascular status: stable and blood pressure returned to baseline Postop Assessment: no apparent nausea or vomiting Anesthetic complications: no   No notable events documented.  Last Vitals:  Vitals:   08/02/22 1315 08/02/22 1330  BP: 115/82 113/84  Pulse: 70 73  Resp: 14 15  Temp:  (!) 35.8 C  SpO2: 95% 94%    Last Pain:  Vitals:   08/02/22 1330  PainSc: 0-No pain                 Shia Eber

## 2022-08-02 NOTE — Plan of Care (Signed)
  Problem: Education: Goal: Knowledge of General Education information will improve Description Including pain rating scale, medication(s)/side effects and non-pharmacologic comfort measures Outcome: Progressing   

## 2022-08-02 NOTE — Progress Notes (Signed)
PT Cancellation Note  Patient Details Name: Arabell Neria MRN: [id number] DOB: [birth year]   Cancelled Treatment:    Reason Eval/Treat Not Completed: Other (comment) Pt still feeling numbness in lower legs from spinal, will f/u as able.  Anise Salvo, PT Acute Rehab [facility] [phone]   Rayetta Humphrey 08/02/2022, 4:29 PM

## 2022-08-02 NOTE — Interval H&P Note (Signed)
History and Physical Interval Note: Patient understands that he is here today for right total hip replacement to treat her severe right hip arthritis.  There has been no acute or interval change in her medical status.  See H&P.  The risks and benefits of surgery been explained in detail and informed consent is obtained.  The right operative hip has been marked.  08/02/2022 9:57 AM  Kathryn Woods  has presented today for surgery, with the diagnosis of osteoarthritis right hip.  The various methods of treatment have been discussed with the patient and family. After consideration of risks, benefits and other options for treatment, the patient has consented to  Procedure(s): RIGHT TOTAL HIP ARTHROPLASTY ANTERIOR APPROACH (Right) as a surgical intervention.  The patient's history has been reviewed, patient examined, no change in status, stable for surgery.  I have reviewed the patient's chart and labs.  Questions were answered to the patient's satisfaction.     [name]

## 2022-08-02 NOTE — Op Note (Signed)
Operative Note  Date of operation: 08/02/2022 Preoperative diagnosis: Right hip primary osteoarthritis Postoperative diagnosis: Same  Procedure: Right direct anterior total hip arthroplasty  Implants: Implant Name Type Inv. Item Serial No. Manufacturer Lot No. LRB No. Used Action  PIN SECTOR W/GRIP ACE CUP - [id number] Hips PIN SECTOR W/GRIP ACE CUP  [employer]PU[employer] [id number] Right 1 Implanted  LINER ACETAB NEUTRAL 36ID 520D - [id number] Liner LINER ACETAB NEUTRAL 36ID 520D  DEPU[employer]TH[employer] [id number] Right 1 Implanted  FEM STEM 12/14 TAPER SZ 4 HIP - [id number] Orthopedic Implant FEM STEM 12/14 TAPER SZ 4 HIP  DEPUY ORTH[employer] [id number] Right 1 Implanted  SROM M HEAD PLUS 1.5 - [id number] Hips SROM M HEAD PLUS 1.5  DEPU[employer]TH[employer] [id number] Right 1 Implanted   Surgeon: Vanita Panda. Magnus Ivan, MD Assistant: Rexene Edison, PA-C  Anesthesia: Spinal Antibiotics: IV Ancef EBL: 150 cc Locations: None  Indications: The patient is a 72 year old female well known to [place] who has significant arthritis involving her right hip.  X-rays show osteoarthritis of the hip and she has tried and failed all forms conservative treatment.  Her right hip pain is daily and is a stiff hip.  It is detrimentally affecting her quality of life, her mobility and her actives day living to the point she was to proceed with a hip replacement on the right side.  The risks and benefits of surgery been explained to her in detail.  We talked about the risk of acute blood loss anemia, nerve or vessel injury, fracture, infection, DVT, dislocation, implant failure, leg length differences and wound healing issues.  We talked about her goals being decreased pain, improve mobility, and improve quality of life.  Procedure description: After informed consent was obtained and the appropriate right hip was marked, the patient was brought to the operating room and set up on the stretcher where spinal anesthesia was  obtained.  She was laid in a supine position on the stretcher and a Foley catheter was placed.  Traction boots were placed on both her feet and that she was next placed supine on the Hana fracture table with a perineal post in place in both legs and inline skeletal traction devices but no traction applied.  Her right operative hip was assessed radiographically.  He was then prepped and draped with DuraPrep and sterile drapes.  A timeout was called and she was then applied as the correct patient the correct right hip.  An incision was then made just inferior and posterior to the ASIS and carried slightly obliquely down the leg.  Dissection was carried down to the tensor fascia lata muscle and the tensor fascia was then divided longitudinally to proceed with direct and approach the hip.  Circumflex vessels were identified and cauterized and the hip capsule was identified and opened up in L-type format finding a moderate joint effusion.  Cobra retractors were placed around the medial and lateral femoral neck and a femoral neck cut was made with an oscillating saw just proximal to the lesser trochanter and this was completed with an osteotome.  A corkscrew guide was placed in the femoral head and the femoral head was removed in's entirety and there is a wide area devoid of cartilage.  There was also significant arthritis of the hip socket with sclerotic bone.  We remove remnants of the acetabular labrum and other debris and then slowly reamed in sequential increments from a size 43 reamer going up to a size 49 reamer.  The  last reamer was placed under direct fluoroscopy and all reamers placed under direct visualization.  We then went with the real DePuy sector GRIPTION acetabular component size 50 but I could not get that 50 to seat so we went up to a size 50 reamer for a line to line reaming.  We still cannot that size 50 acetabular component to seat down so we went up to a size 52 acetabular component and that had a  nice tight grip to the bone and fill the acetabular easily.  We then went with a 36+0 neutral polythene liner for that size 52 acetabular component.  Attention was then turned to the femur.  With the right leg externally rotated to 120 degrees, extended and adducted, we are able to place a Mueller retractor medially and a Hohmann retractor behind the greater trochanter.  The lateral joint capsule was released and a box cutting osteotome was used in the femoral canal.  Broaching was then initiated using the Actis broaching system from a size of 0 broach going up to a size 4 broach.  With a size 4 broach in place we trialed a standard offset femoral neck and a 36+1.5 trial hip ball.  This was reduced in the pelvis and we assessed it radiographically and clinically and felt like we needed more offset.  We dislocated the hip and remove the trial components.  We then placed the real size 4 Actis femoral component but one with high offset and went with a 36+1.5 metal hip ball.  This was reduced in the acetabulum and again we are pleased with leg length, offset, range of motion and stability.  We then irrigated the soft tissue with normal saline solution.  The joint capsule remnants were closed with interrupted #1 Ethibond suture followed by #1 Vicryl to close the tensor fascia.  0 Vicryl was used to close deep tissue and 2-0 Vicryl was used to close subcutaneous tissue.  The skin was closed with staples.  An Aquacel dressing was applied.  The patient was taken off the Hana table and taken the recovery room.  Rexene Edison, PA-C did assist during the entire case and beginning to end and his assistance was medically necessary and crucial for soft tissue management and retraction, helping guide implant placement and a layered closure of the wound.

## 2022-08-02 NOTE — Progress Notes (Signed)
Pt c/o of upper abdominal "cramping" that "goes down to left leg." Requested to stand up for relief. After pt stood and laid down, pt reports feeling nauseous, cold and clammy and pain still present "across the ribcage" pointing at the same location towards her upper abdomen. Pt states that she has had this cramping since she started taking hydrochlorothiazide. Pt connected to unit monitor and 12 lead EKG. Dr. Tacy Dura notified. MD came to see pt and reviewed most recent vital signs and EKG reading. No new orders at this time.

## 2022-08-02 NOTE — Anesthesia Procedure Notes (Signed)
Date/Time: 08/02/2022 11:00 AM  Performed by: Florene Route, CRNAOxygen Delivery Method: Simple face mask

## 2022-08-02 NOTE — Anesthesia Preprocedure Evaluation (Deleted)
Anesthesia Evaluation  Patient identified by MRN, date of birth, ID band Patient awake    Reviewed: Allergy & Precautions, H&P , NPO status , Patient's Chart, lab work & pertinent test results, reviewed documented beta blocker date and time   Airway Mallampati: II  TM Distance: >3 FB Neck ROM: full    Dental no notable dental hx. (+) Teeth Intact, Dental Advisory Given   Pulmonary former smoker   Pulmonary exam normal breath sounds clear to auscultation       Cardiovascular Exercise Tolerance: Good negative cardio ROS  Rhythm:regular Rate:Normal     Neuro/Psych  Headaches  negative psych ROS   GI/Hepatic negative GI ROS, Neg liver ROS,,,  Endo/Other  negative endocrine ROS    Renal/GU negative Renal ROS  negative genitourinary   Musculoskeletal  (+) Arthritis , Osteoarthritis,    Abdominal   Peds  Hematology negative hematology ROS (+)   Anesthesia Other Findings   Reproductive/Obstetrics negative OB ROS                             Anesthesia Physical Anesthesia Plan  ASA: 2  Anesthesia Plan: MAC and Spinal   Post-op Pain Management:  Regional for Post-op pain, GA combined w/ Regional for post-op pain and Minimal or no pain anticipated   Induction: Intravenous  PONV Risk Score and Plan: 3 and Propofol infusion  Airway Management Planned: Natural Airway, Nasal Cannula, Simple Face Mask and Mask  Additional Equipment: None  Intra-op Plan:   Post-operative Plan:   Informed Consent: I have reviewed the patients History and Physical, chart, labs and discussed the procedure including the risks, benefits and alternatives for the proposed anesthesia with the patient or authorized representative who has indicated his/her understanding and acceptance.     Dental Advisory Given  Plan Discussed with: CRNA and Anesthesiologist  Anesthesia Plan Comments:         Anesthesia  Quick Evaluation

## 2022-08-03 ENCOUNTER — Encounter (HOSPITAL_COMMUNITY): Payer: Self-pay | Admitting: Orthopaedic Surgery

## 2022-08-03 DIAGNOSIS — M1611 Unilateral primary osteoarthritis, right hip: Secondary | ICD-10-CM | POA: Diagnosis not present

## 2022-08-03 LAB — CBC
HCT: 31.8 % — ABNORMAL LOW (ref 36.0–46.0)
Hemoglobin: 10.3 g/dL — ABNORMAL LOW (ref 12.0–15.0)
MCH: 28.3 pg (ref 26.0–34.0)
MCHC: 32.4 g/dL (ref 30.0–36.0)
MCV: 87.4 fL (ref 80.0–100.0)
Platelets: 241 10*3/uL (ref 150–400)
RBC: 3.64 MIL/uL — ABNORMAL LOW (ref 3.87–5.11)
RDW: 15 % (ref 11.5–15.5)
WBC: 15.7 10*3/uL — ABNORMAL HIGH (ref 4.0–10.5)
nRBC: 0 % (ref 0.0–0.2)

## 2022-08-03 LAB — BASIC METABOLIC PANEL
Anion gap: 11 (ref 5–15)
BUN: 15 mg/dL (ref 8–23)
CO2: 25 mmol/L (ref 22–32)
Calcium: 9.3 mg/dL (ref 8.9–10.3)
Chloride: 100 mmol/L (ref 98–111)
Creatinine, Ser: 0.93 mg/dL (ref 0.44–1.00)
GFR, Estimated: >60 mL/min (ref >60)
Glucose, Bld: 135 mg/dL — ABNORMAL HIGH (ref 70–99)
Potassium: 4.1 mmol/L (ref 3.5–5.1)
Sodium: 136 mmol/L (ref 135–145)

## 2022-08-03 MED ORDER — OXYCODONE HCL 5 MG PO TABS: 5.0000 mg | ORAL_TABLET | Freq: Four times a day (QID) | ORAL | 0 refills | Status: DC | PRN

## 2022-08-03 MED ORDER — METHOCARBAMOL 500 MG PO TABS: 500.0000 mg | ORAL_TABLET | Freq: Four times a day (QID) | ORAL | 1 refills | Status: DC | PRN

## 2022-08-03 MED ORDER — ASPIRIN 81 MG PO CHEW: 81.0000 mg | CHEWABLE_TABLET | Freq: Two times a day (BID) | ORAL | 0 refills | Status: DC

## 2022-08-03 NOTE — Progress Notes (Signed)
Physical Therapy Treatment Patient Details Name: Kathryn Woods MRN: [id number] DOB: 12-Apr-1950 Today's Date: 08/03/2022   History of Present Illness Pt is 72 yo female s/p R anterior THA on 08/02/22. Pt with hx including OA, L reverse TSA, back surgery, HLD, R knee arthroscopy 2020.    PT Comments    Pt progressing well, limited by pain and fatigue. Pt is unsure if she feels ready to d/c today. Will see again this afternoon.    Recommendations for follow up therapy are one component of a multi-disciplinary discharge planning process, led by the attending physician.  Recommendations may be updated based on patient status, additional functional criteria and insurance authorization.  Follow Up Recommendations       Assistance Recommended at Discharge Intermittent Supervision/Assistance  Patient can return home with the following A little help with walking and/or transfers;A little help with bathing/dressing/bathroom;Assistance with cooking/housework;Help with stairs or ramp for entrance   Equipment Recommendations  Rolling walker (2 wheels)    Recommendations for Other Services       Precautions / Restrictions Precautions Precautions: Fall Restrictions Weight Bearing Restrictions: No RLE Weight Bearing: Weight bearing as tolerated     Mobility  Bed Mobility Overal bed mobility: Needs Assistance Bed Mobility: Supine to Sit     Supine to sit: Min assist     General bed mobility comments: Light min A for R LE; increased time    Transfers Overall transfer level: Needs assistance Equipment used: Rolling walker (2 wheels) Transfers: Sit to/from Stand Sit to Stand: Min guard           General transfer comment: Cues for hand placement and R LE management; Min guard for safety;    Ambulation/Gait Ambulation/Gait assistance: Min guard Gait Distance (Feet): 30 Feet Assistive device: Rolling walker (2 wheels) Gait Pattern/deviations: Step-to pattern,  Decreased step length - right, Decreased step length - left, Decreased stance time - right Gait velocity: decreased     General Gait Details: min/guard for safety. slow but steady gait. cues for sequence   Stairs             Wheelchair Mobility    Modified Rankin (Stroke Patients Only)       Balance     Sitting balance-[name]: Good     Standing balance support: Bilateral upper extremity supported, Reliant on assistive device for balance Standing balance-[name]: Poor                              Cognition Arousal/Alertness: Awake/alert Behavior During Therapy: WFL for tasks assessed/performed Overall Cognitive Status: Within Functional Limits for tasks assessed                                          Exercises Total Joint Exercises Ankle Circles/Pumps: AROM, Both, 10 reps Quad Sets: AROM, Both, 5 reps Heel Slides: AAROM, Right (3 reps)    General Comments        Pertinent Vitals/Pain Pain Assessment Pain Assessment: 0-10 Pain Score: 6  Pain Location: R hip Pain Descriptors / Indicators: Discomfort, Sore, Burning Pain Intervention(s): Limited activity within patient's tolerance, Monitored during session, Premedicated before session, Repositioned, Ice applied    Home Living  Prior Function            PT Goals (current goals can now be found in the care plan section) Acute Rehab PT Goals Patient Stated Goal: return home PT Goal Formulation: With patient/family Time For Goal Achievement: 08/16/22 Potential to Achieve Goals: Good Progress towards PT goals: Progressing toward goals    Frequency    7X/week      PT Plan Current plan remains appropriate    Co-evaluation              AM-PAC PT "6 Clicks" Mobility   Outcome Measure  Help needed turning from your back to your side while in a flat bed without using bedrails?: A Little Help needed moving from lying  on your back to sitting on the side of a flat bed without using bedrails?: A Little Help needed moving to and from a bed to a chair (including a wheelchair)?: A Little Help needed standing up from a chair using your arms (e.g., wheelchair or bedside chair)?: A Little Help needed to walk in hospital room?: A Little Help needed climbing 3-5 steps with a railing? : A Little 6 Click Score: 18    End of Session Equipment Utilized During Treatment: Gait belt Activity Tolerance: Patient tolerated treatment well Patient left: with chair alarm set;in chair;with call bell/phone within reach Nurse Communication: Mobility status PT Visit Diagnosis: Other abnormalities of gait and mobility (R26.89);Muscle weakness (generalized) (M62.81)     Time: 1610-9604 PT Time Calculation (min) (ACUTE ONLY): 20 min  Charges:  $Gait Training: 8-22 mins                     Delice Bison, PT  Acute Rehab Dept ([facility]) [phone]  08/03/2022    [facility] 08/03/2022, 10:15 AM

## 2022-08-03 NOTE — Discharge Instructions (Signed)

## 2022-08-03 NOTE — TOC Transition Note (Signed)
Transition of Care [facility]) - CM/SW Discharge Note   Patient Details  Name: Kathryn Woods MRN: [id number] Date of Birth: 05/20/1950  Transition of Care [facility]) CM/SW Contact:  Amada Jupiter, LCSW Phone Number: 08/03/2022, 10:52 AM   Clinical Narrative:     Met with pt who confirms need for RW and no DME agency preference - order placed with [employer] for delivery to room today.  HHPT prearrnaged with [facility].  No further TOC needs.  Final next level of care: Home w Home Health Services Barriers to Discharge: No Barriers Identified   Patient Goals and CMS Choice      Discharge Placement                         Discharge Plan and Services Additional resources added to the After Visit Summary for                  DME Arranged: Walker rolling DME Agency: [employer] Date DME Agency Contacted: 08/03/22     HH Arranged: PT HH Agency: [facility]        Social Determinants of Health (SDOH) Interventions SDOH Screenings   Food Insecurity: No Food Insecurity (08/02/2022)  Housing: Low Risk  (08/02/2022)  Transportation Needs: No Transportation Needs (08/02/2022)  Utilities: Not At Risk (08/02/2022)  Tobacco Use: Medium Risk (08/02/2022)     Readmission Risk Interventions     No data to display

## 2022-08-03 NOTE — Progress Notes (Signed)
Physical Therapy Treatment Patient Details Name: Kathryn Woods MRN: [id number] DOB: 07/05/1950 Today's Date: 08/03/2022   History of Present Illness Pt is 72 yo female s/p R anterior THA on 08/02/22. Pt with hx including OA, L reverse TSA, back surgery, HLD, R knee arthroscopy 2020.    PT Comments    Pt progressing this session. Incr gait distance, tolerated exercises after amb without significant fatigue or incr in pain. Will continue PT POC, hopefully ready for d/c tomorrow   Recommendations for follow up therapy are one component of a multi-disciplinary discharge planning process, led by the attending physician.  Recommendations may be updated based on patient status, additional functional criteria and insurance authorization.  Follow Up Recommendations       Assistance Recommended at Discharge Intermittent Supervision/Assistance  Patient can return home with the following A little help with walking and/or transfers;A little help with bathing/dressing/bathroom;Assistance with cooking/housework;Help with stairs or ramp for entrance   Equipment Recommendations  Rolling walker (2 wheels)    Recommendations for Other Services       Precautions / Restrictions Precautions Precautions: Fall Restrictions Weight Bearing Restrictions: No RLE Weight Bearing: Weight bearing as tolerated     Mobility  Bed Mobility Overal bed mobility: Needs Assistance Bed Mobility: Supine to Sit, Sit to Supine     Supine to sit: Min assist Sit to supine: Min assist   General bed mobility comments: Light min A for R LE; increased time    Transfers Overall transfer level: Needs assistance Equipment used: Rolling walker (2 wheels) Transfers: Sit to/from Stand Sit to Stand: Min guard, Supervision           General transfer comment: Cues for hand placement and R LE management; Min guard to supervision for safety;    Ambulation/Gait Ambulation/Gait assistance: Min guard Gait  Distance (Feet): 80 Feet Assistive device: Rolling walker (2 wheels) Gait Pattern/deviations: Step-to pattern, Decreased step length - right, Decreased step length - left, Decreased stance time - right Gait velocity: decreased     General Gait Details: min/guard for safety. slow but steady gait. cues for sequence   Stairs             Wheelchair Mobility    Modified Rankin (Stroke Patients Only)       Balance     Sitting balance-[name]: Good     Standing balance support: Bilateral upper extremity supported, Reliant on assistive device for balance Standing balance-[name]: Poor                              Cognition Arousal/Alertness: Awake/alert Behavior During Therapy: WFL for tasks assessed/performed Overall Cognitive Status: Within Functional Limits for tasks assessed                                          Exercises Total Joint Exercises Ankle Circles/Pumps: AROM, Both, 10 reps Quad Sets: AROM, Both, 10 reps Heel Slides: AAROM, Right, 10 reps    General Comments        Pertinent Vitals/Pain Pain Assessment Pain Assessment: 0-10 Pain Score: 5  Pain Location: R hip Pain Descriptors / Indicators: Discomfort, Sore, Burning Pain Intervention(s): Limited activity within patient's tolerance, Monitored during session, Premedicated before session, Repositioned, Ice applied    Home Living  Prior Function            PT Goals (current goals can now be found in the care plan section) Acute Rehab PT Goals Patient Stated Goal: return home PT Goal Formulation: With patient/family Time For Goal Achievement: 08/16/22 Potential to Achieve Goals: Good Progress towards PT goals: Progressing toward goals    Frequency    7X/week      PT Plan Current plan remains appropriate    Co-evaluation              AM-PAC PT "6 Clicks" Mobility   Outcome Measure  Help needed turning  from your back to your side while in a flat bed without using bedrails?: A Little Help needed moving from lying on your back to sitting on the side of a flat bed without using bedrails?: A Little Help needed moving to and from a bed to a chair (including a wheelchair)?: A Little Help needed standing up from a chair using your arms (e.g., wheelchair or bedside chair)?: A Little Help needed to walk in hospital room?: A Little Help needed climbing 3-5 steps with a railing? : A Little 6 Click Score: 18    End of Session Equipment Utilized During Treatment: Gait belt Activity Tolerance: Patient tolerated treatment well Patient left: with call bell/phone within reach;in bed;with bed alarm set Nurse Communication: Mobility status PT Visit Diagnosis: Other abnormalities of gait and mobility (R26.89);Muscle weakness (generalized) (M62.81)     Time: 1610-9604 PT Time Calculation (min) (ACUTE ONLY): 16 min  Charges:  $Gait Training: 8-22 mins                     Delice Bison, PT  Acute Rehab Dept ([facility]) [phone]  08/03/2022    [facility] 08/03/2022, 4:21 PM

## 2022-08-03 NOTE — Plan of Care (Signed)
  Problem: Education: Goal: Knowledge of the prescribed therapeutic regimen will improve Outcome: Progressing Goal: Understanding of discharge needs will improve Outcome: Progressing   Problem: Activity: Goal: Ability to avoid complications of mobility impairment will improve Outcome: Progressing Goal: Ability to tolerate increased activity will improve Outcome: Progressing   Problem: Pain Management: Goal: Pain level will decrease with appropriate interventions Outcome: Progressing   Problem: Nutrition: Goal: Adequate nutrition will be maintained Outcome: Progressing   

## 2022-08-03 NOTE — Progress Notes (Signed)
Subjective: 1 Day Post-Op Procedure(s) (LRB): RIGHT TOTAL HIP ARTHROPLASTY ANTERIOR APPROACH (Right) Patient reports pain as moderate.  She feels like she struggled too much with therapy today to be able to go home safely today and hopes to go home tomorrow.  Objective: Vital signs in last 24 hours: Temp:  [96.5 F (35.8 C)-98.1 F (36.7 C)] 97.6 F (36.4 C) (04/27 0931) Pulse Rate:  [69-84] 75 (04/27 0931) Resp:  [8-22] 17 (04/27 0931) BP: (110-132)/(74-89) 113/75 (04/27 0931) SpO2:  [93 %-100 %] 97 % (04/27 0931)  Intake/Output from previous day: 04/26 0701 - 04/27 0700 In: 3119.6 [P.O.:780; I.V.:2034.6; IV Piggyback:305] Out: 1900 [Urine:1750; Blood:150] Intake/Output this shift: No intake/output data recorded.  Recent Labs    08/03/22 0329  HGB 10.3*   Recent Labs    08/03/22 0329  WBC 15.7*  RBC 3.64*  HCT 31.8*  PLT 241   Recent Labs    08/03/22 0329  NA 136  K 4.1  CL 100  CO2 25  BUN 15  CREATININE 0.93  GLUCOSE 135*  CALCIUM 9.3   No results for input(s): "LABPT", "INR" in the last 72 hours.  Sensation intact distally Intact pulses distally Dorsiflexion/Plantar flexion intact Incision: dressing C/D/I   Assessment/Plan: 1 Day Post-Op Procedure(s) (LRB): RIGHT TOTAL HIP ARTHROPLASTY ANTERIOR APPROACH (Right) Up with therapy Plan for discharge tomorrow Discharge home with home health   If utilization review feels that it is necessary to change this patient to inpatient status, they have my permission to do so as a voice order.   [name] 08/03/2022, 10:55 AM

## 2022-08-03 NOTE — Plan of Care (Signed)
Problem: Education: Goal: Knowledge of the prescribed therapeutic regimen will improve Outcome: Progressing   Problem: Activity: Goal: Ability to avoid complications of mobility impairment will improve Outcome: Progressing   Problem: Clinical Measurements: Goal: Postoperative complications will be avoided or minimized Outcome: Progressing   Problem: Pain Management: Goal: Pain level will decrease with appropriate interventions Outcome: Progressing   Haydee Salter, RN 08/03/22 4:02 PM

## 2022-08-04 DIAGNOSIS — M1611 Unilateral primary osteoarthritis, right hip: Secondary | ICD-10-CM | POA: Diagnosis not present

## 2022-08-04 NOTE — Progress Notes (Signed)
Patient feels slightly better than yesterday. Continues to require more PT Surgical bandage is c/d/I D/c home today vs tomorrow based on how she does today.

## 2022-08-04 NOTE — Plan of Care (Signed)

## 2022-08-04 NOTE — Progress Notes (Signed)
Physical Therapy Treatment Patient Details Name: Kathryn Woods MRN: [id number] DOB: 02/15/1951 Today's Date: 08/04/2022   History of Present Illness Pt is 72 yo female s/p R anterior THA on 08/02/22. Pt with hx including OA, L reverse TSA, back surgery, HLD, R knee arthroscopy 2020.    PT Comments    Pt progressing, requires incr time to complete tasks however moving with min/guard to supervision. Could likely d/c from PT standpoint   Recommendations for follow up therapy are one component of a multi-disciplinary discharge planning process, led by the attending physician.  Recommendations may be updated based on patient status, additional functional criteria and insurance authorization.  Follow Up Recommendations       Assistance Recommended at Discharge Intermittent Supervision/Assistance  Patient can return home with the following A little help with walking and/or transfers;A little help with bathing/dressing/bathroom;Assistance with cooking/housework;Help with stairs or ramp for entrance   Equipment Recommendations  Rolling walker (2 wheels)    Recommendations for Other Services       Precautions / Restrictions Precautions Precautions: Fall Restrictions Weight Bearing Restrictions: No RLE Weight Bearing: Weight bearing as tolerated     Mobility  Bed Mobility Overal bed mobility: Needs Assistance Bed Mobility: Supine to Sit     Supine to sit: Min assist, Min guard     General bed mobility comments: Light min A for R LE to initiate, pt able to continue to self progress with gait belt as leg lifter ; increased time    Transfers Overall transfer level: Needs assistance Equipment used: Rolling walker (2 wheels) Transfers: Sit to/from Stand, Bed to chair/wheelchair/BSC Sit to Stand: Min guard, Supervision   Step pivot transfers: Supervision, Min guard       General transfer comment: Cues for hand placement and R LE management; Min guard to supervision  for safety;    Ambulation/Gait Ambulation/Gait assistance: Min guard, Supervision Gait Distance (Feet): 55 Feet Assistive device: Rolling walker (2 wheels) Gait Pattern/deviations: Step-to pattern, Decreased step length - right, Decreased step length - left, Decreased stance time - right Gait velocity: decreased     General Gait Details: min/guard for safety. slow but steady gait. cues for sequence   Stairs             Wheelchair Mobility    Modified Rankin (Stroke Patients Only)       Balance     Sitting balance-[name]: Good     Standing balance support: Bilateral upper extremity supported, Reliant on assistive device for balance Standing balance-[name]: Poor                              Cognition Arousal/Alertness: Awake/alert Behavior During Therapy: WFL for tasks assessed/performed Overall Cognitive Status: Within Functional Limits for tasks assessed                                          Exercises      General Comments        Pertinent Vitals/Pain Pain Assessment Pain Assessment: 0-10 Pain Score: 6  Pain Location: R hip Pain Descriptors / Indicators: Discomfort, Sore Pain Intervention(s): Limited activity within patient's tolerance, Monitored during session, Premedicated before session, Repositioned, Ice applied    Home Living  Prior Function            PT Goals (current goals can now be found in the care plan section) Acute Rehab PT Goals Patient Stated Goal: return home PT Goal Formulation: With patient/family Time For Goal Achievement: 08/16/22 Potential to Achieve Goals: Good Progress towards PT goals: Progressing toward goals    Frequency    7X/week      PT Plan Current plan remains appropriate    Co-evaluation              AM-PAC PT "6 Clicks" Mobility   Outcome Measure  Help needed turning from your back to your side while in a flat  bed without using bedrails?: A Little Help needed moving from lying on your back to sitting on the side of a flat bed without using bedrails?: A Little Help needed moving to and from a bed to a chair (including a wheelchair)?: A Little Help needed standing up from a chair using your arms (e.g., wheelchair or bedside chair)?: A Little Help needed to walk in hospital room?: A Little Help needed climbing 3-5 steps with a railing? : A Little 6 Click Score: 18    End of Session Equipment Utilized During Treatment: Gait belt Activity Tolerance: Patient tolerated treatment well Patient left: in chair;with call bell/phone within reach;with chair alarm set   PT Visit Diagnosis: Other abnormalities of gait and mobility (R26.89);Muscle weakness (generalized) (M62.81)     Time: 1610-9604 PT Time Calculation (min) (ACUTE ONLY): 26 min  Charges:  $Gait Training: 23-37 mins                     Kathryn Woods, PT  Acute Rehab Dept ([facility]) [phone]  08/04/2022    [facility] 08/04/2022, 11:02 AM

## 2022-08-04 NOTE — Progress Notes (Signed)
PT TX NOTE  08/04/22 1400  PT Visit Information  Last PT Received On 08/04/22  Assistance Needed Pt progressing well, feels pain is not well enough controlled for d/c today. Will continue to follow, pt ready to d/c from mobility standpoint.   History of Present Illness Pt is 72 yo female s/p R anterior THA on 08/02/22. Pt with hx including OA, L reverse TSA, back surgery, HLD, R knee arthroscopy 2020.  Subjective Data  Patient Stated Goal return home  Precautions  Precautions Fall  Restrictions  Weight Bearing Restrictions No  RLE Weight Bearing WBAT  Pain Assessment  Pain Assessment 0-10  Pain Score 8  Pain Location R hip  Pain Descriptors / Indicators Discomfort;Sore  Pain Intervention(s) Limited activity within patient's tolerance;Monitored during session;Premedicated before session;Repositioned;Ice applied  Cognition  Arousal/Alertness Awake/alert  Behavior During Therapy WFL for tasks assessed/performed  Overall Cognitive Status Within Functional Limits for tasks assessed  Bed Mobility  Overal bed mobility Needs Assistance  Bed Mobility Supine to Sit;Sit to Supine  Supine to sit Supervision  General bed mobility comments pt able to  self progress with gait belt as leg lifter ; increased time. assist to lift RLE on to bed d/t pain  Transfers  Overall transfer level Needs assistance  Equipment used Rolling walker (2 wheels)  Transfers Sit to/from Stand;Bed to chair/wheelchair/BSC  Sit to Stand Min guard;Supervision  General transfer comment Cues for hand placement and R LE management; Min guard to supervision for safety;  Ambulation/Gait  Ambulation/Gait assistance Min guard;Supervision  Gait Distance (Feet) 60 Feet  Assistive device Rolling walker (2 wheels)  Gait Pattern/deviations Step-to pattern;Decreased step length - right;Decreased step length - left;Decreased stance time - right  General Gait Details min/guard for safety. slow but steady gait. cues for sequence  Gait  velocity decreased  Balance  Sitting balance-[name] Good  Standing balance support Bilateral upper extremity supported;Reliant on assistive device for balance  Standing balance-[name] Poor  Total Joint Exercises  Ankle Circles/Pumps AROM;Both;10 reps  Quad Sets AROM;Both;10 reps  Heel Slides AAROM;Right;10 reps  Short Arc Quad AROM;10 reps;AAROM;Right  Hip ABduction/ADduction AROM;Right;10 reps  PT - End of Session  Equipment Utilized During Treatment Gait belt  Activity Tolerance Patient tolerated treatment well  Patient left in chair;with call bell/phone within reach;with chair alarm set   PT - Assessment/Plan  PT Plan Current plan remains appropriate  PT Visit Diagnosis Other abnormalities of gait and mobility (R26.89);Muscle weakness (generalized) (M62.81)  PT Frequency (ACUTE ONLY) 7X/week  Follow Up Recommendations Follow physician's recommendations for discharge plan and follow up therapies  Assistance recommended at discharge Intermittent Supervision/Assistance  Patient can return home with the following A little help with walking and/or transfers;A little help with bathing/dressing/bathroom;Assistance with cooking/housework;Help with stairs or ramp for entrance  PT equipment Rolling walker (2 wheels)  AM-PAC PT "6 Clicks" Mobility Outcome Measure (Version 2)  Help needed turning from your back to your side while in a flat bed without using bedrails? 3  Help needed moving from lying on your back to sitting on the side of a flat bed without using bedrails? 3  Help needed moving to and from a bed to a chair (including a wheelchair)? 3  Help needed standing up from a chair using your arms (e.g., wheelchair or bedside chair)? 3  Help needed to walk in hospital room? 3  Help needed climbing 3-5 steps with a railing?  3  6 Click Score 18  Consider Recommendation of Discharge  To: Home with [facility]  Progressive Mobility  What is the highest level of mobility based on the  progressive mobility assessment? Level 5 (Walks with assist in room/hall) - Balance while stepping forward/back and can walk in room with assist - Complete  PT Goal Progression  Progress towards PT goals Progressing toward goals  Acute Rehab PT Goals  PT Goal Formulation With patient/family  Time For Goal Achievement 08/16/22  Potential to Achieve Goals Good  PT Time Calculation  PT Start Time (ACUTE ONLY) 1408  PT Stop Time (ACUTE ONLY) 1433  PT Time Calculation (min) (ACUTE ONLY) 25 min  PT General Charges  $$ ACUTE PT VISIT 1 Visit  PT Treatments  $Gait Training 8-22 mins  $Therapeutic Exercise 8-22 mins

## 2022-08-05 DIAGNOSIS — M1611 Unilateral primary osteoarthritis, right hip: Secondary | ICD-10-CM | POA: Diagnosis not present

## 2022-08-05 NOTE — Progress Notes (Signed)
Subjective: 3 Days Post-Op Procedure(s) (LRB): RIGHT TOTAL HIP ARTHROPLASTY ANTERIOR APPROACH (Right) Patient reports pain as moderate.  Has been working slowly with therapy and her mobility.  Objective: Vital signs in last 24 hours: Temp:  [98.2 F (36.8 C)-98.8 F (37.1 C)] 98.2 F (36.8 C) (04/29 0530) Pulse Rate:  [83-89] 85 (04/29 0530) Resp:  [15-18] 18 (04/29 0530) BP: (105-143)/(57-90) 105/57 (04/29 0530) SpO2:  [94 %-98 %] 94 % (04/29 0530)  Intake/Output from previous day: 04/28 0701 - 04/29 0700 In: 1165 [P.O.:1165] Out: -  Intake/Output this shift: No intake/output data recorded.  Recent Labs    08/03/22 0329  HGB 10.3*   Recent Labs    08/03/22 0329  WBC 15.7*  RBC 3.64*  HCT 31.8*  PLT 241   Recent Labs    08/03/22 0329  NA 136  K 4.1  CL 100  CO2 25  BUN 15  CREATININE 0.93  GLUCOSE 135*  CALCIUM 9.3   No results for input(s): "LABPT", "INR" in the last 72 hours.  Sensation intact distally Intact pulses distally Dorsiflexion/Plantar flexion intact Incision: dressing C/D/I   Assessment/Plan: 3 Days Post-Op Procedure(s) (LRB): RIGHT TOTAL HIP ARTHROPLASTY ANTERIOR APPROACH (Right) Up with therapy I think she can be discharge to home today after working more with therapy.     [name] 08/05/2022, 7:27 AM

## 2022-08-05 NOTE — Progress Notes (Signed)
Discharge package printed and instructions given to patient. Patient verbalizes understanding. 

## 2022-08-05 NOTE — Progress Notes (Signed)
Physical Therapy Treatment Patient Details Name: Kathryn Woods MRN: [id number] DOB: 18-Jun-1950 Today's Date: 08/05/2022   History of Present Illness Pt is 72 yo female s/p R anterior THA on 08/02/22. Pt with hx including OA, L reverse TSA, back surgery, HLD, R knee arthroscopy 2020.    PT Comments    Pt continues to progress, incr gait distance this am. Reports pain has improved compared to yesterday. Pt is ready to d/c with family assist as needed.    Recommendations for follow up therapy are one component of a multi-disciplinary discharge planning process, led by the attending physician.  Recommendations may be updated based on patient status, additional functional criteria and insurance authorization.  Follow Up Recommendations       Assistance Recommended at Discharge Intermittent Supervision/Assistance  Patient can return home with the following A little help with walking and/or transfers;A little help with bathing/dressing/bathroom;Assistance with cooking/housework;Help with stairs or ramp for entrance   Equipment Recommendations  Rolling walker (2 wheels)    Recommendations for Other Services       Precautions / Restrictions Precautions Precautions: Fall Restrictions Weight Bearing Restrictions: No RLE Weight Bearing: Weight bearing as tolerated     Mobility  Bed Mobility Overal bed mobility: Needs Assistance Bed Mobility: Supine to Sit, Sit to Supine     Supine to sit: Supervision Sit to supine: Min assist   General bed mobility comments: pt able to  self progress with gait belt as leg lifter ; increased time. light assist to lift RLE on to bed d/t pain    Transfers Overall transfer level: Needs assistance Equipment used: Rolling walker (2 wheels) Transfers: Sit to/from Stand, Bed to chair/wheelchair/BSC Sit to Stand: Min guard, Supervision   Step pivot transfers: Supervision, Modified independent (Device/Increase time)       General transfer  comment: good carryover from previous sessions with pt demonstrating correct hand placement and RLE position    Ambulation/Gait Ambulation/Gait assistance: Supervision, Modified independent (Device/Increase time) Gait Distance (Feet): 70 Feet Assistive device: Rolling walker (2 wheels) Gait Pattern/deviations: Step-to pattern, Decreased step length - right, Decreased step length - left, Decreased stance time - right Gait velocity: decreased     General Gait Details: improving wt shift to RLE   Stairs             Wheelchair Mobility    Modified Rankin (Stroke Patients Only)       Balance     Sitting balance-[name]: Good     Standing balance support: Bilateral upper extremity supported, Reliant on assistive device for balance Standing balance-[name]: Poor                              Cognition Arousal/Alertness: Awake/alert Behavior During Therapy: WFL for tasks assessed/performed Overall Cognitive Status: Within Functional Limits for tasks assessed                                          Exercises Total Joint Exercises Heel Slides: AAROM, Right, 10 reps Hip ABduction/ADduction: AROM, Right, 10 reps    General Comments        Pertinent Vitals/Pain Pain Assessment Pain Assessment: 0-10 Pain Score: 5  Pain Location: R hip Pain Descriptors / Indicators: Discomfort, Sore Pain Intervention(s): Limited activity within patient's tolerance, Monitored during session, Premedicated before session, Repositioned, Ice applied  Home Living                          Prior Function            PT Goals (current goals can now be found in the care plan section) Acute Rehab PT Goals Patient Stated Goal: return home PT Goal Formulation: With patient/family Time For Goal Achievement: 08/16/22 Potential to Achieve Goals: Good Progress towards PT goals: Progressing toward goals    Frequency    7X/week       PT Plan Current plan remains appropriate    Co-evaluation              AM-PAC PT "6 Clicks" Mobility   Outcome Measure  Help needed turning from your back to your side while in a flat bed without using bedrails?: A Little Help needed moving from lying on your back to sitting on the side of a flat bed without using bedrails?: A Little Help needed moving to and from a bed to a chair (including a wheelchair)?: A Little Help needed standing up from a chair using your arms (e.g., wheelchair or bedside chair)?: A Little Help needed to walk in hospital room?: A Little Help needed climbing 3-5 steps with a railing? : A Little 6 Click Score: 18    End of Session Equipment Utilized During Treatment: Gait belt Activity Tolerance: Patient tolerated treatment well Patient left: with call bell/phone within reach;with bed alarm set   PT Visit Diagnosis: Other abnormalities of gait and mobility (R26.89);Muscle weakness (generalized) (M62.81)     Time: 1610-9604 PT Time Calculation (min) (ACUTE ONLY): 28 min  Charges:  $Gait Training: 23-37 mins                     Brecklynn Jian, PT  Acute Rehab Dept ([facility]) [phone]  08/05/2022    [facility] 08/05/2022, 10:22 AM

## 2022-08-05 NOTE — Discharge Summary (Signed)
Patient ID: Kathryn Woods MRN: [id number] DOB/AGE: 09/08/1950 72 y.o.  Admit date: 08/02/2022 Discharge date: 08/05/2022  Admission Diagnoses:  Principal Problem:   Unilateral primary osteoarthritis, right hip Active Problems:   Status post total replacement of right hip   Discharge Diagnoses:  Same  Past Medical History:  Diagnosis Date   Arthritis    lumbar region, spondylolisthesis    Hypercholesteremia     Surgeries: Procedure(s): RIGHT TOTAL HIP ARTHROPLASTY ANTERIOR APPROACH on 08/02/2022   Consultants:   Discharged Condition: Improved  Hospital Course: Kathryn Woods is an 72 y.o. female who was admitted 08/02/2022 for operative treatment ofUnilateral primary osteoarthritis, right hip. Patient has severe unremitting pain that affects sleep, daily activities, and work/hobbies. After pre-op clearance the patient was taken to the operating room on 08/02/2022 and underwent  Procedure(s): RIGHT TOTAL HIP ARTHROPLASTY ANTERIOR APPROACH.    Patient was given perioperative antibiotics:  Anti-infectives (From admission, onward)    Start     Dose/Rate Route Frequency Ordered Stop   08/02/22 1700  ceFAZolin (ANCEF) IVPB 1 g/50 mL premix        1 g 100 mL/hr over 30 Minutes Intravenous Every 6 hours 08/02/22 1610 08/03/22 0835   08/02/22 0915  ceFAZolin (ANCEF) IVPB 2g/100 mL premix        2 g 200 mL/hr over 30 Minutes Intravenous On call to O.R. 08/02/22 0905 08/02/22 1117        Patient was given sequential compression devices, early ambulation, and chemoprophylaxis to prevent DVT.  Patient benefited maximally from hospital stay and there were no complications.    Recent vital signs: Patient Vitals for the past 24 hrs:  BP Temp Temp src Pulse Resp SpO2  08/05/22 1306 (!) 122/91 98.6 F (37 C) Oral 92 -- 94 %  08/05/22 0530 (!) 105/57 98.2 F (36.8 C) -- 85 18 94 %  08/04/22 2033 (!) 143/90 98.6 F (37 C) -- 89 18 98 %     Recent laboratory  studies:  Recent Labs    08/03/22 0329  WBC 15.7*  HGB 10.3*  HCT 31.8*  PLT 241  NA 136  K 4.1  CL 100  CO2 25  BUN 15  CREATININE 0.93  GLUCOSE 135*  CALCIUM 9.3     Discharge Medications:   Allergies as of 08/05/2022       Reactions   Aspirin Other (See Comments)   "Messes stomach up real bad"   Naproxen Hives, Itching        Medication List     TAKE these medications    aspirin 81 MG chewable tablet Chew 1 tablet (81 mg total) by mouth 2 (two) times daily.   cyanocobalamin 1000 MCG tablet Commonly known as: VITAMIN B12 Take 1,000 mcg by mouth daily.   Fish Oil 1000 MG Caps Take 1,000 mg by mouth daily.   hydrochlorothiazide 25 MG tablet Commonly known as: HYDRODIURIL Take 25 mg by mouth daily.   methocarbamol 500 MG tablet Commonly known as: ROBAXIN Take 1 tablet (500 mg total) by mouth every 6 (six) hours as needed for muscle spasms.   multivitamin tablet Take 2 tablets by mouth daily. Gummies   oxyCODONE 5 MG immediate release tablet Commonly known as: Oxy IR/ROXICODONE Take 1-2 tablets (5-10 mg total) by mouth every 6 (six) hours as needed for moderate pain (pain score 4-6). No more than 6 tablets/day.               Durable Medical  Equipment  (From admission, onward)           Start     Ordered   08/02/22 1611  DME 3 n 1  Once        08/02/22 1610   08/02/22 1611  DME Walker rolling  Once       Question Answer Comment  Walker: With 5 Inch Wheels   Patient needs a walker to treat with the following condition Status post total replacement of right hip      08/02/22 1610            Diagnostic Studies: DG Pelvis Portable  Result Date: 08/02/2022 CLINICAL DATA:  Status post right hip replacement EXAM: PORTABLE PELVIS 1 VIEW COMPARISON:  08/02/2022 04/29/2022 hip radiographs FINDINGS: Status post right total hip arthroplasty. No periprosthetic lucency or fracture. Near anatomic alignment of the prosthetic components. Air  within the soft tissues is not unexpected postoperatively. Redemonstrated degenerative changes in left acetabulum. IMPRESSION: Status post right total hip arthroplasty without complicating features. Electronically Signed   By: Wiliam Ke M.D.   On: 08/02/2022 13:36   DG HIP UNILAT WITH PELVIS 1V RIGHT  Result Date: 08/02/2022 CLINICAL DATA:  Hip replacement EXAM: DG HIP (WITH OR WITHOUT PELVIS) 1V RIGHT COMPARISON:  04/29/2022 FINDINGS: Three fluoroscopic images are obtained during the performance of the procedure and are provided for interpretation only. Images demonstrate right total hip arthroplasty, with near anatomic alignment of the prosthetic components. Fluoroscopy time: 21 seconds Cumulative air kerma: 3.56 mGy IMPRESSION: Three fluoroscopic images are obtained during the performance of a right total hip arthroplasty. Electronically Signed   By: Wiliam Ke M.D.   On: 08/02/2022 12:29   DG C-Arm 1-60 Min-No Report  Result Date: 08/02/2022 Fluoroscopy was utilized by the requesting physician.  No radiographic interpretation.    Disposition: Discharge disposition: 01-Home or Self Care          Follow-up Information     [name], MD Follow up in 2 week(s).   Specialty: Orthopedic Surgery Contact information: [address] [place] [address] [phone]         [facility] Follow up.   Specialty: Home Health Services Why: to provide home physical therapy visits Contact information: [address] [place] [address] [phone]                  Signed: [name] 08/05/2022, 3:58 PM

## 2022-08-12 ENCOUNTER — Telehealth: Payer: Self-pay | Admitting: Orthopaedic Surgery

## 2022-08-12 ENCOUNTER — Other Ambulatory Visit: Payer: Self-pay | Admitting: Orthopaedic Surgery

## 2022-08-12 MED ORDER — OXYCODONE HCL 5 MG PO TABS: 5.0000 mg | ORAL_TABLET | Freq: Four times a day (QID) | ORAL | 0 refills | Status: DC | PRN

## 2022-08-12 NOTE — Telephone Encounter (Signed)
Patient asking if she can drive and she also need a refill on her Oxycodone. Please advise

## 2022-08-12 NOTE — Telephone Encounter (Signed)
Patient aware of the below message  

## 2022-08-15 ENCOUNTER — Ambulatory Visit (INDEPENDENT_AMBULATORY_CARE_PROVIDER_SITE_OTHER): Payer: Medicare HMO | Admitting: Orthopaedic Surgery

## 2022-08-15 ENCOUNTER — Encounter: Payer: Self-pay | Admitting: Orthopaedic Surgery

## 2022-08-15 DIAGNOSIS — Z96641 Presence of right artificial hip joint: Secondary | ICD-10-CM

## 2022-08-15 NOTE — Progress Notes (Signed)
The patient is here today for first postoperative visit status post a right total hip arthroplasty.  She actually drove herself today.  She does ambulate with a rolling walker.  She has home therapy coming to her house and she says they have at least 1 or 2 more weeks to come.  She has been compliant with a baby aspirin twice a day and is not taking any pain medication.  Her right hip incision looks good.  I remove the staples in place Steri-Strips.  There is no significant seroma.  Overall she is pleased.  I am as well.  Will see her back in 4 weeks to see how she is doing overall but no x-rays are needed.

## 2022-08-22 ENCOUNTER — Telehealth: Payer: Self-pay | Admitting: Orthopaedic Surgery

## 2022-08-22 ENCOUNTER — Other Ambulatory Visit: Payer: Self-pay | Admitting: Orthopaedic Surgery

## 2022-08-22 MED ORDER — OXYCODONE HCL 5 MG PO TABS: 5.0000 mg | ORAL_TABLET | Freq: Four times a day (QID) | ORAL | 0 refills | Status: DC | PRN

## 2022-08-22 NOTE — Telephone Encounter (Signed)
Patient called needing Rx refilled Oxycodone. The number to contact patient is [phone]

## 2022-09-04 ENCOUNTER — Encounter: Payer: Medicare HMO | Admitting: Orthopaedic Surgery

## 2022-09-09 ENCOUNTER — Telehealth: Payer: Self-pay | Admitting: Orthopaedic Surgery

## 2022-09-09 ENCOUNTER — Other Ambulatory Visit: Payer: Self-pay | Admitting: Orthopaedic Surgery

## 2022-09-09 MED ORDER — OXYCODONE HCL 5 MG PO TABS: 5.0000 mg | ORAL_TABLET | Freq: Four times a day (QID) | ORAL | 0 refills | Status: DC | PRN

## 2022-09-09 NOTE — Telephone Encounter (Signed)
Pt called requesting refill of oxycodone. Please send to pharmacy on file. Pt phone number is [phone].

## 2022-09-16 ENCOUNTER — Ambulatory Visit (INDEPENDENT_AMBULATORY_CARE_PROVIDER_SITE_OTHER): Payer: Medicare HMO | Admitting: Orthopaedic Surgery

## 2022-09-16 DIAGNOSIS — Z96641 Presence of right artificial hip joint: Secondary | ICD-10-CM

## 2022-09-16 NOTE — Progress Notes (Signed)
The patient is now 6 weeks status post a right total hip arthroplasty.  She is 72 years old.  She does ambit with a cane but is doing well overall and has no complaints.  She reports good range of motion and strength.  On exam her right operative hip moves smoothly and fluidly with just some stiffness compared to her left native hip.  Overall though she does look good.  At this point follow-up can be in 6 months.  She knows to see [place] sooner if there are any issues.  At her 73-month follow-up of a standing low AP pelvis and lateral of her right operative hip.

## 2022-09-24 ENCOUNTER — Other Ambulatory Visit: Payer: Self-pay | Admitting: Orthopaedic Surgery

## 2022-09-24 ENCOUNTER — Telehealth: Payer: Self-pay | Admitting: Orthopaedic Surgery

## 2022-09-24 MED ORDER — OXYCODONE HCL 5 MG PO TABS: 5.0000 mg | ORAL_TABLET | Freq: Four times a day (QID) | ORAL | 0 refills | Status: DC | PRN

## 2022-09-24 NOTE — Telephone Encounter (Signed)
Pt called requesting pain medication. Please send to pharmacy on file. Pt phone number is [phone].

## 2022-10-08 ENCOUNTER — Emergency Department (HOSPITAL_COMMUNITY)
Admission: EM | Admit: 2022-10-08 | Discharge: 2022-10-08 | Disposition: A | Discharge status: Home / Self Care | Payer: Medicare HMO | Attending: Emergency Medicine | Admitting: Emergency Medicine

## 2022-10-08 ENCOUNTER — Emergency Department (HOSPITAL_COMMUNITY): Payer: Medicare HMO

## 2022-10-08 ENCOUNTER — Other Ambulatory Visit: Payer: Self-pay

## 2022-10-08 ENCOUNTER — Encounter (HOSPITAL_COMMUNITY): Payer: Self-pay

## 2022-10-08 DIAGNOSIS — Z7982 Long term (current) use of aspirin: Secondary | ICD-10-CM | POA: Diagnosis not present

## 2022-10-08 DIAGNOSIS — Z1152 Encounter for screening for COVID-19: Secondary | ICD-10-CM | POA: Insufficient documentation

## 2022-10-08 DIAGNOSIS — F419 Anxiety disorder, unspecified: Secondary | ICD-10-CM | POA: Insufficient documentation

## 2022-10-08 DIAGNOSIS — J40 Bronchitis, not specified as acute or chronic: Secondary | ICD-10-CM | POA: Insufficient documentation

## 2022-10-08 DIAGNOSIS — R0602 Shortness of breath: Secondary | ICD-10-CM

## 2022-10-08 DIAGNOSIS — D72829 Elevated white blood cell count, unspecified: Secondary | ICD-10-CM | POA: Insufficient documentation

## 2022-10-08 LAB — URINALYSIS, ROUTINE W REFLEX MICROSCOPIC
Bacteria, UA: NONE SEEN
Bilirubin Urine: NEGATIVE
Glucose, UA: NEGATIVE mg/dL
Hgb urine dipstick: NEGATIVE
Ketones, ur: NEGATIVE mg/dL
Nitrite: NEGATIVE
Protein, ur: NEGATIVE mg/dL
Specific Gravity, Urine: 1.001 — ABNORMAL LOW (ref 1.005–1.030)
pH: 6 (ref 5.0–8.0)

## 2022-10-08 LAB — BASIC METABOLIC PANEL
Anion gap: 10 (ref 5–15)
BUN: 9 mg/dL (ref 8–23)
CO2: 23 mmol/L (ref 22–32)
Calcium: 9 mg/dL (ref 8.9–10.3)
Chloride: 102 mmol/L (ref 98–111)
Creatinine, Ser: 0.83 mg/dL (ref 0.44–1.00)
GFR, Estimated: >60 mL/min (ref >60)
Glucose, Bld: 119 mg/dL — ABNORMAL HIGH (ref 70–99)
Potassium: 3.3 mmol/L — ABNORMAL LOW (ref 3.5–5.1)
Sodium: 135 mmol/L (ref 135–145)

## 2022-10-08 LAB — CBC
HCT: 33.1 % — ABNORMAL LOW (ref 36.0–46.0)
Hemoglobin: 10.7 g/dL — ABNORMAL LOW (ref 12.0–15.0)
MCH: 27.2 pg (ref 26.0–34.0)
MCHC: 32.3 g/dL (ref 30.0–36.0)
MCV: 84.2 fL (ref 80.0–100.0)
Platelets: 321 10*3/uL (ref 150–400)
RBC: 3.93 MIL/uL (ref 3.87–5.11)
RDW: 14.9 % (ref 11.5–15.5)
WBC: 22 10*3/uL — ABNORMAL HIGH (ref 4.0–10.5)
nRBC: 0 % (ref 0.0–0.2)

## 2022-10-08 LAB — RESP PANEL BY RT-PCR (RSV, FLU A&B, COVID)  RVPGX2
Influenza A by PCR: NEGATIVE
Influenza B by PCR: NEGATIVE
Resp Syncytial Virus by PCR: NEGATIVE
SARS Coronavirus 2 by RT PCR: NEGATIVE

## 2022-10-08 LAB — CULTURE, BLOOD (ROUTINE X 2): Culture: NO GROWTH

## 2022-10-08 LAB — TROPONIN I (HIGH SENSITIVITY)
Troponin I (High Sensitivity): 5 ng/L (ref <18)
Troponin I (High Sensitivity): 5 ng/L (ref <18)

## 2022-10-08 LAB — LACTIC ACID, PLASMA: Lactic Acid, Venous: 1.3 mmol/L (ref 0.5–1.9)

## 2022-10-08 MED ORDER — ALBUTEROL SULFATE HFA 108 (90 BASE) MCG/ACT IN AERS: 2.0000 | INHALATION_SPRAY | Freq: Once | RESPIRATORY_TRACT | Status: DC

## 2022-10-08 MED ORDER — ACETAMINOPHEN 500 MG PO TABS
1000.0000 mg | ORAL_TABLET | Freq: Once | ORAL | Status: AC
  Administered 2022-10-08: 1000 mg via ORAL
  Filled 2022-10-08: qty 2

## 2022-10-08 MED ORDER — IOHEXOL 350 MG/ML SOLN
100.0000 mL | Freq: Once | INTRAVENOUS | Status: AC | PRN
  Administered 2022-10-08: 100 mL via INTRAVENOUS

## 2022-10-08 MED ORDER — ALBUTEROL SULFATE HFA 108 (90 BASE) MCG/ACT IN AERS
2.0000 | INHALATION_SPRAY | Freq: Once | RESPIRATORY_TRACT | Status: AC
  Administered 2022-10-08: 2 via RESPIRATORY_TRACT
  Filled 2022-10-08: qty 6.7

## 2022-10-08 NOTE — ED Provider Notes (Signed)
[place] EMERGENCY DEPARTMENT AT [facility] Provider Note   CSN: [id number] Arrival date & time: 10/08/22  0017     History  Chief Complaint  Patient presents with   Chest Pain   Shortness of Breath    Kathryn Woods is a 72 y.o. female.  HPI     This is a 72 year old female who presents with acute onset shortness of breath.  Patient reports that she developed acute onset of chest tightness and shortness of breath with cough around 10 PM last night.  No history of COPD or CHF.  She is a non-smoker.  She not had any fevers to her knowledge.  No nausea or vomiting.  Reports pain is mostly with deep breathing and coughing.  Of note, temperature 100.4 in triage.  Home Medications Prior to Admission medications   Medication Sig Start Date End Date Taking? Authorizing Provider  aspirin 81 MG chewable tablet Chew 1 tablet (81 mg total) by mouth 2 (two) times daily. 08/03/22   [name], MD  hydrochlorothiazide (HYDRODIURIL) 25 MG tablet Take 25 mg by mouth daily.    [provider]  methocarbamol (ROBAXIN) 500 MG tablet Take 1 tablet (500 mg total) by mouth every 6 (six) hours as needed for muscle spasms. 08/03/22   [name], MD  Multiple Vitamin (MULTIVITAMIN) tablet Take 2 tablets by mouth daily. Gummies    [provider]  Omega-3 Fatty Acids (FISH OIL) 1000 MG CAPS Take 1,000 mg by mouth daily.    [provider]  oxyCODONE (OXY IR/ROXICODONE) 5 MG immediate release tablet Take 1 tablet (5 mg total) by mouth every 6 (six) hours as needed for moderate pain (pain score 4-6). No more than 6 tablets/day. 09/24/22   [name], MD  vitamin B-12 (CYANOCOBALAMIN) 1000 MCG tablet Take 1,000 mcg by mouth daily.    [provider]      Allergies    Aspirin and Naproxen    Review of Systems   Review of Systems  Constitutional:  Positive for fever.  Respiratory:  Positive for cough, chest  tightness and shortness of breath.   Cardiovascular:  Negative for chest pain, palpitations and leg swelling.  All other systems reviewed and are negative.   Physical Exam Updated Vital Signs BP (!) 155/98   Pulse 90   Temp 98.3 F (36.8 C) (Oral)   Resp 17   Ht 1.676 m (5\' 6" )   Wt 73 kg   SpO2 95%   BMI 25.98 kg/m  Physical Exam Vitals and nursing note reviewed.  Constitutional:      Appearance: She is not ill-appearing or toxic-appearing.  HENT:     Head: Normocephalic and atraumatic.  Eyes:     Pupils: Pupils are equal, round, and reactive to light.  Cardiovascular:     Rate and Rhythm: Regular rhythm. Tachycardia present.     Heart sounds: Normal heart sounds.  Pulmonary:     Effort: Pulmonary effort is normal. No respiratory distress.     Breath sounds: No wheezing.     Comments: Occasional rhonchorous/wheezy breath sounds Abdominal:     Palpations: Abdomen is soft.  Musculoskeletal:     Cervical back: Neck supple.     Right lower leg: No tenderness. No edema.     Left lower leg: No tenderness. No edema.  Skin:    General: Skin is warm and dry.  Neurological:     Mental Status: She is alert and  oriented to person, place, and time.  Psychiatric:        Mood and Affect: Mood is anxious.     ED Results / Procedures / Treatments   Labs (all labs ordered are listed, but only abnormal results are displayed) Labs Reviewed  BASIC METABOLIC PANEL - Abnormal; Notable for the following components:      Result Value   Potassium 3.3 (*)    Glucose, Bld 119 (*)    All other components within normal limits  CBC - Abnormal; Notable for the following components:   WBC 22.0 (*)    Hemoglobin 10.7 (*)    HCT 33.1 (*)    All other components within normal limits  URINALYSIS, ROUTINE W REFLEX MICROSCOPIC - Abnormal; Notable for the following components:   Color, Urine COLORLESS (*)    Specific Gravity, Urine 1.001 (*)    Leukocytes,Ua TRACE (*)    All other  components within normal limits  CULTURE, BLOOD (ROUTINE X 2)  CULTURE, BLOOD (ROUTINE X 2)  RESP PANEL BY RT-PCR (RSV, FLU A&B, COVID)  [id number]  LACTIC ACID, PLASMA  TROPONIN I (HIGH SENSITIVITY)  TROPONIN I (HIGH SENSITIVITY)    EKG EKG Interpretation Date/Time:  Tuesday October 08 2022 00:27:53 EDT Ventricular Rate:  104 PR Interval:  150 QRS Duration:  81 QT Interval:  359 QTC Calculation: 473 R Axis:   49  Text Interpretation: Sinus tachycardia Atrial premature complex Abnormal R-wave progression, early transition Confirmed by Ross Marcus ([id number]) on 10/08/2022 3:42:05 AM  Radiology CT Angio Chest PE W and/or Wo Contrast  Result Date: 10/08/2022 CLINICAL DATA:  Pulmonary embolism suspected, high probability. Chest tightness, shortness of breath, and cough. EXAM: CT ANGIOGRAPHY CHEST WITH CONTRAST TECHNIQUE: Multidetector CT imaging of the chest was performed using the standard protocol during bolus administration of intravenous contrast. Multiplanar CT image reconstructions and MIPs were obtained to evaluate the vascular anatomy. RADIATION DOSE REDUCTION: This exam was performed according to the departmental dose-optimization program which includes automated exposure control, adjustment of the mA and/or kV according to patient size and/or use of iterative reconstruction technique. CONTRAST:  OMNIPAQUE IOHEXOL 350 MG/ML SOLN COMPARISON:  04/13/2019. FINDINGS: Cardiovascular: The heart is normal in size and there is no pericardial effusion. A few scattered coronary artery calcifications are noted. There is atherosclerotic calcification of the aorta without evidence of aneurysm. The pulmonary trunk is mildly distended which may be associated with underlying pulmonary artery hypertension. No evidence of pulmonary embolism. Mediastinum/Nodes: No mediastinal, hilar, or axillary lymphadenopathy by size criteria. The thyroid gland, trachea, and esophagus are within normal limits.  Lungs/Pleura: Paraseptal and centrilobular emphysematous changes are present in the lungs. No effusion or pneumothorax. A stable 2 mm nodule is noted in the right upper lobe, axial image 16, unchanged from 2021 and likely benign. Subpleural reticulation and fibrotic changes are noted bilaterally. Upper Abdomen: No acute abnormality. Musculoskeletal: Left shoulder arthroplasty changes are noted. Degenerative changes are present in the thoracic spine. No acute osseous abnormality. Review of the MIP images confirms the above findings. IMPRESSION: 1. No evidence of pulmonary embolism or other acute process. 2. Mild emphysema and subpleural fibrotic changes in the lungs bilaterally. 3. Distended pulmonary trunk suggesting underlying pulmonary artery hypertension. 4. Aortic atherosclerosis and coronary artery calcifications. Electronically Signed   By: Thornell Sartorius M.D.   On: 10/08/2022 02:53   DG Chest 2 View  Result Date: 10/08/2022 CLINICAL DATA:  Shortness of breath EXAM: CHEST - 2 VIEW COMPARISON:  09/11/2019  FINDINGS: Cardiac shadow is within normal limits. Tortuous thoracic aorta is noted. Lungs are well aerated bilaterally. No focal infiltrate or effusion is seen. No bony abnormality is noted. IMPRESSION: No active cardiopulmonary disease. Electronically Signed   By: Alcide Clever M.D.   On: 10/08/2022 01:25    Procedures Procedures    Medications Ordered in ED Medications  acetaminophen (TYLENOL) tablet 1,000 mg (1,000 mg Oral Given 10/08/22 0227)  iohexol (OMNIPAQUE) 350 MG/ML injection 100 mL (100 mLs Intravenous Contrast Given 10/08/22 0233)  albuterol (VENTOLIN HFA) 108 (90 Base) MCG/ACT inhaler 2 puff (2 puffs Inhalation Given 10/08/22 0451)    ED Course/ Medical Decision Making/ A&P Clinical Course as of 10/08/22 0637  Tue Oct 08, 2022  0607 Patient resting comfortably off oxygen.  Awaiting urinalysis.  She was able to ambulate maintain her pulse ox.  States she felt somewhat better after  getting an inhaler.  She may have some acute bronchitis. [CH]    Clinical Course User Index [CH] Iara Monds, Mayer Masker, MD                             Medical Decision Making Amount and/or Complexity of Data Reviewed Labs: ordered. Radiology: ordered.  Risk OTC drugs. Prescription drug management.   This patient presents to the ED for concern of shortness of breath, cough, this involves an extensive number of treatment options, and is a complaint that carries with it a high risk of complications and morbidity.  I considered the following differential and admission for this acute, potentially life threatening condition.  The differential diagnosis includes bony, pneumothorax, bronchitis, viral illness such as COVID or influenza, PE  MDM:    This is a 72 year old female who presents with concern for cough and shortness of breath.  Initially she is tearful and anxious appearing.  Temperature 100.4.  Mildly tachycardic.  She has an occasional rhonchorous or wheezy breath sound but is a non-smoker and no history of COPD.  Chest x-ray without pneumothorax or pneumonia.  Labs obtained and reviewed.  Lab workup notable for white count of 22.  She states she is not on any prednisone.  No other cell lines abnormalities of note.  Lactate normal.  Not overtly septic.  Initially she was placed on O2 but this was titrated off and she was able to ambulate maintain her pulse ox.  CT scan is obtained to evaluate for PE versus occult pneumonia.  CT does indicate some emphysematous changes and fibrotic changes.  She may also have pulmonary artery hypertension.  No evidence of pneumonia.  Some improvement with an inhaler.  Urinalysis does not show any evidence of infection.  Leukocytosis seems out of proportion to presentation.  Do not feel there is an indication for antibiotics at this time but do feel she needs to have this rechecked.  Will send home with an inhaler.  EKG and troponins are reassuring.  (Labs,  imaging, consults)  Labs: I Ordered, and personally interpreted labs.  The pertinent results include: CBC, BMP, lactate, troponin x 2  Imaging Studies ordered: I ordered imaging studies including x-ray, CT chest I independently visualized and interpreted imaging. I agree with the radiologist interpretation  Additional history obtained from chart review.  External records from outside source obtained and reviewed including prior evaluations  Cardiac Monitoring: The patient was maintained on a cardiac monitor.  If on the cardiac monitor, I personally viewed and interpreted the cardiac monitored which showed  an underlying rhythm of: Sinus rhythm  Reevaluation: After the interventions noted above, I reevaluated the patient and found that they have :improved  Social Determinants of Health:  lives independently  Disposition: Discharge  Co morbidities that complicate the patient evaluation  Past Medical History:  Diagnosis Date   Arthritis    lumbar region, spondylolisthesis    Hypercholesteremia      Medicines Meds ordered this encounter  Medications   acetaminophen (TYLENOL) tablet 1,000 mg   iohexol (OMNIPAQUE) 350 MG/ML injection 100 mL   albuterol (VENTOLIN HFA) 108 (90 Base) MCG/ACT inhaler 2 puff   DISCONTD: albuterol (VENTOLIN HFA) 108 (90 Base) MCG/ACT inhaler 2 puff    I have reviewed the patients home medicines and have made adjustments as needed  Problem List / ED Course: Problem List Items Addressed This Visit   None Visit Diagnoses     Shortness of breath    -  Primary   Leukocytosis, unspecified type       Bronchitis                       Final Clinical Impression(s) / ED Diagnoses Final diagnoses:  Shortness of breath  Leukocytosis, unspecified type  Bronchitis    Rx / DC Orders ED Discharge Orders     None         Shon Baton, MD 10/08/22 [phone]

## 2022-10-08 NOTE — ED Triage Notes (Signed)
Pt BIB [facility] c/o chest tightness, SOB and cough that started around 2200.   1 nitroglycerin given PTA

## 2022-10-08 NOTE — ED Notes (Signed)
Patient maintained between 93-96% on room air while ambulating up and back down hallway.

## 2022-10-08 NOTE — Discharge Instructions (Signed)
You were seen today for shortness of breath and chest pain.  Your workup is fairly reassuring.  You have no evidence of pneumonia or blood clots.  Your COVID and influenza testing were negative as well.  You may have some bronchitis.  Use the inhaler provided to you as needed.  You can take 2 puffs every 4-6 hours.  Your white blood cell count was elevated but there is no obvious infection in your workup.  You need to have this rechecked by your primary doctor in 1 to 2 weeks.

## 2022-10-09 LAB — CULTURE, BLOOD (ROUTINE X 2)

## 2022-10-13 LAB — CULTURE, BLOOD (ROUTINE X 2)
Culture: NO GROWTH
Special Requests: ADEQUATE

## 2022-10-14 ENCOUNTER — Other Ambulatory Visit: Payer: Self-pay | Admitting: Orthopaedic Surgery

## 2022-10-14 ENCOUNTER — Telehealth: Payer: Self-pay | Admitting: Orthopaedic Surgery

## 2022-10-14 MED ORDER — OXYCODONE HCL 5 MG PO TABS: 5.0000 mg | ORAL_TABLET | Freq: Three times a day (TID) | ORAL | 0 refills | Status: DC | PRN

## 2022-10-14 NOTE — Telephone Encounter (Signed)
Patient called. Would like a refill on oxycodone.  °

## 2022-11-25 ENCOUNTER — Other Ambulatory Visit: Payer: Self-pay | Admitting: Orthopaedic Surgery

## 2022-11-25 ENCOUNTER — Telehealth: Payer: Self-pay | Admitting: Orthopaedic Surgery

## 2022-11-25 MED ORDER — HYDROCODONE-ACETAMINOPHEN 5-325 MG PO TABS: 1.0000 | ORAL_TABLET | Freq: Three times a day (TID) | ORAL | 0 refills | Status: DC | PRN

## 2022-11-25 NOTE — Telephone Encounter (Signed)
Tried calling pt to advise. No answer and no voice mail

## 2022-11-25 NOTE — Telephone Encounter (Signed)
Pt would like a medication refill of oxycodone due to hip replacement pains. Please call pt at [phone]

## 2023-01-05 IMAGING — DX DG HIP (WITH OR WITHOUT PELVIS) 2-3V*R*
3 series · 3 of 3 positions shown · non-contrast
Comparison: Right hip radiographs 04/12/2020

CLINICAL DATA: Right hip and groin pain. Worse with weight-bearing.

EXAM:
DG HIP (WITH OR WITHOUT PELVIS) 2-3V RIGHT

[pelvis ap]
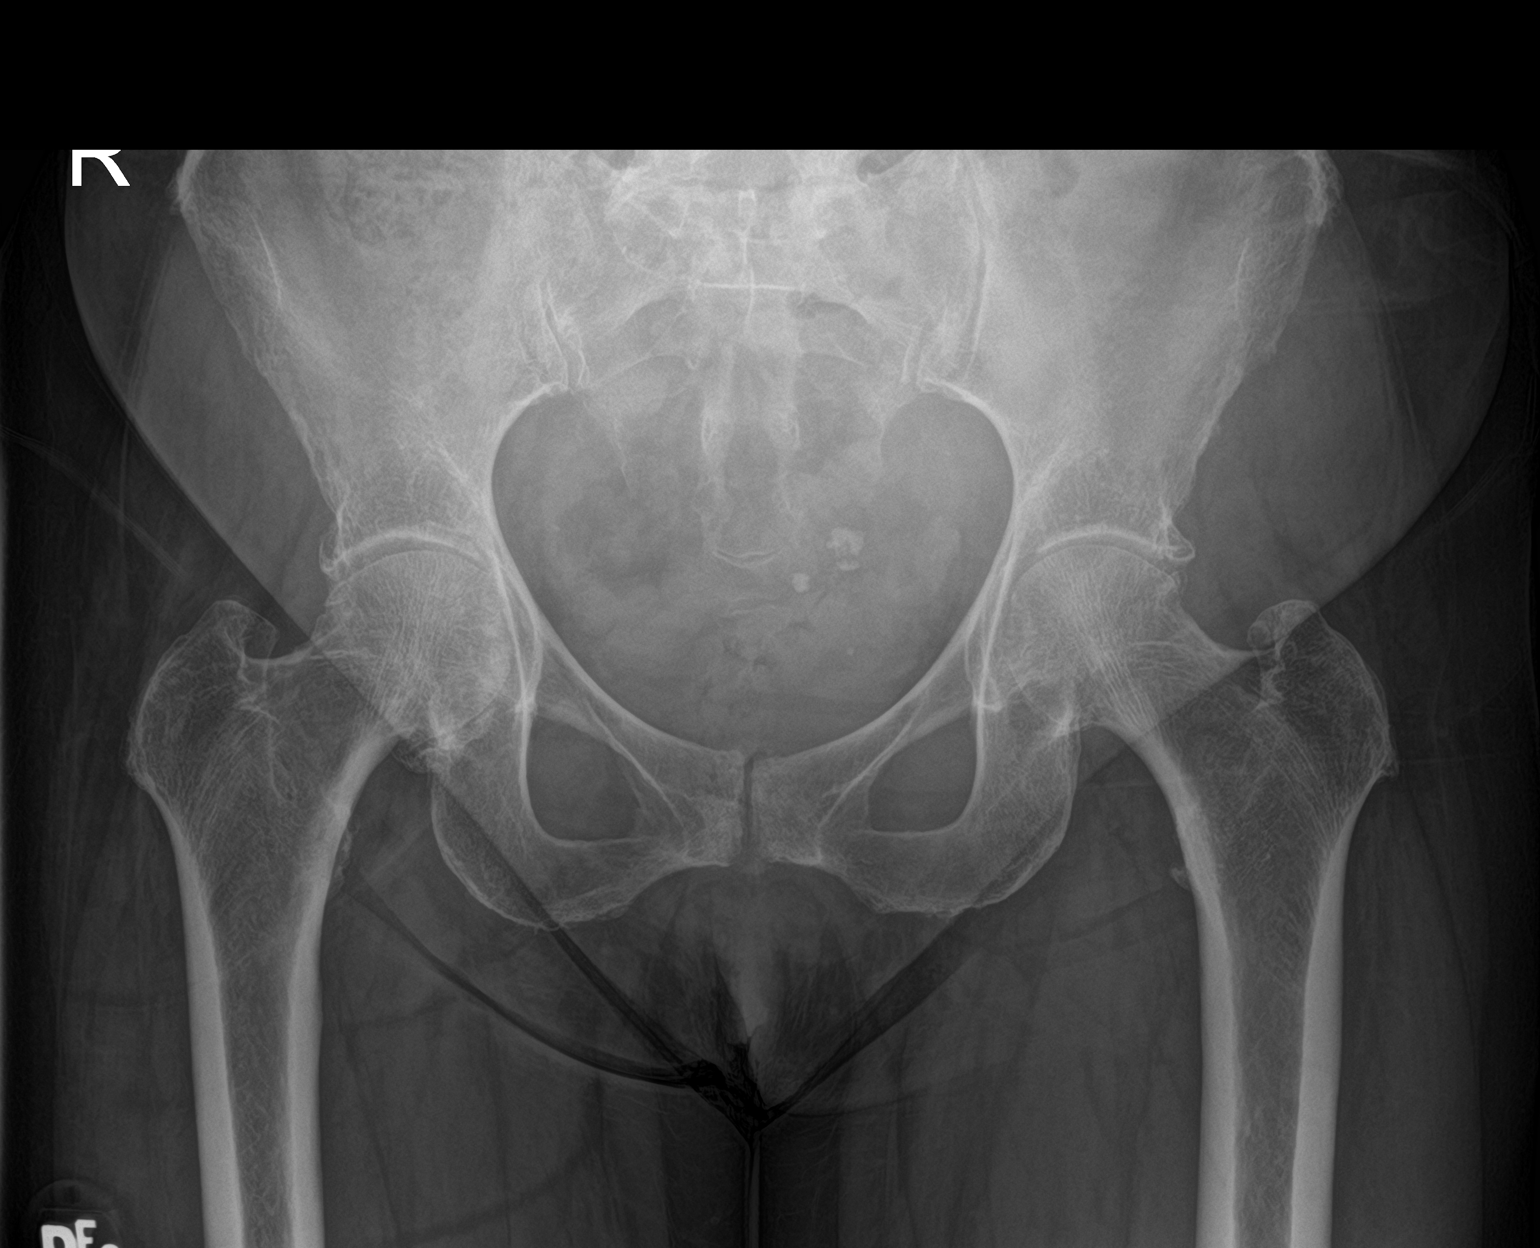

[hip ap]
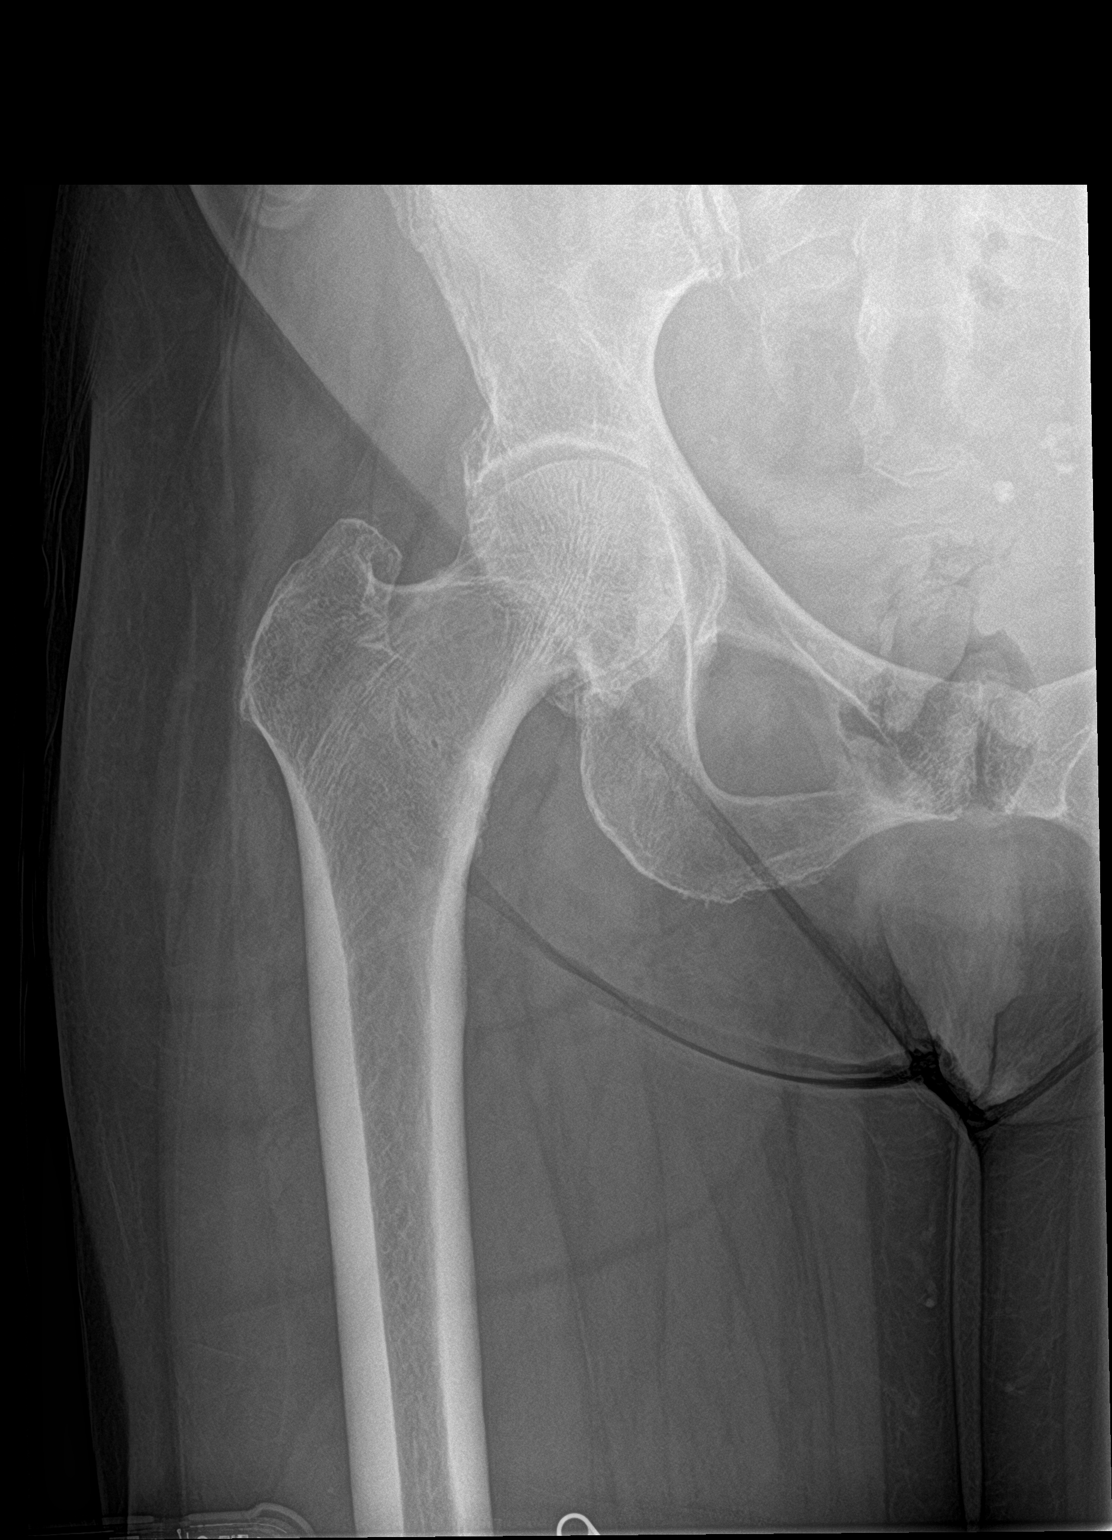

[hip frog leg]
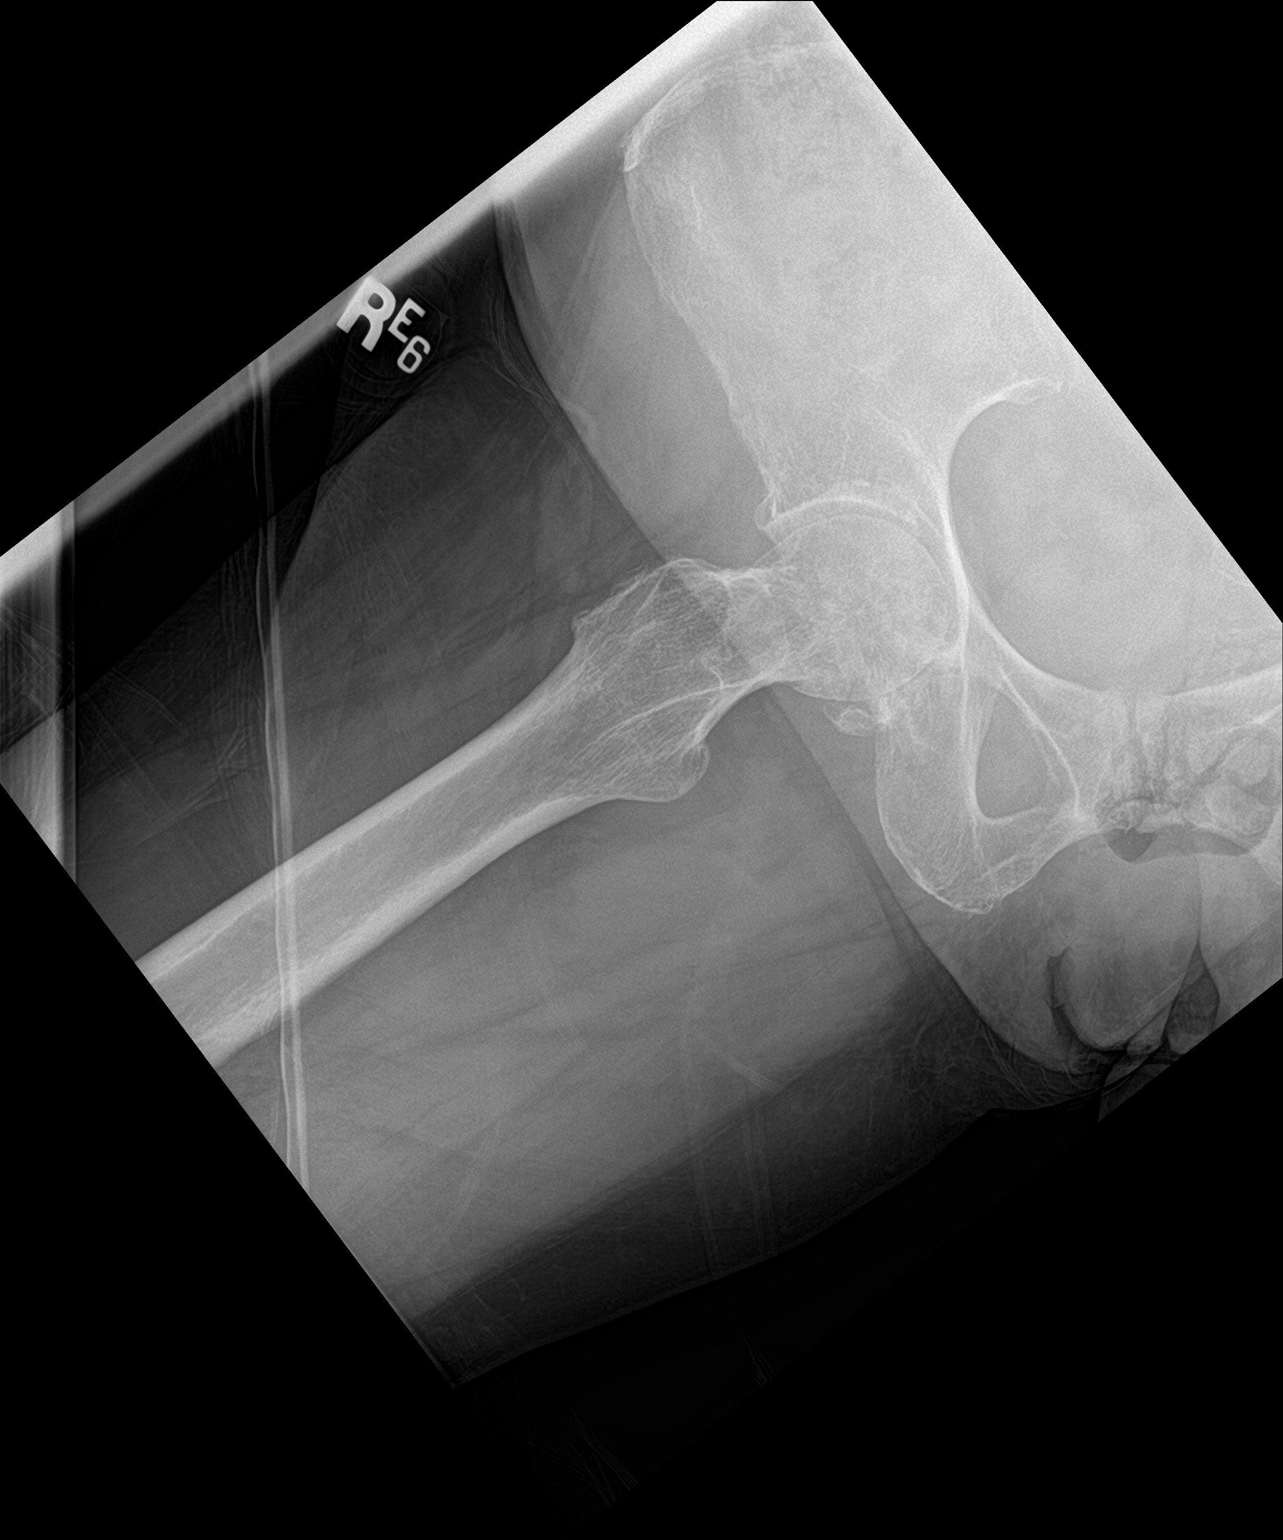

[3 of 3 positions shown; findings below may reference images not displayed]

FINDINGS: Right hip is located. Moderate degenerative changes are again noted
with prominent osteophytes. No acute or healing fracture is present.
More mild degenerative changes are present in the left hip.
IMPRESSION: No acute abnormality or significant interval change.

Stable asymmetric degenerative changes in the right hip.

## 2023-03-19 ENCOUNTER — Ambulatory Visit: Payer: Medicare HMO | Admitting: Orthopaedic Surgery

## 2023-04-16 ENCOUNTER — Ambulatory Visit: Payer: Medicare HMO | Admitting: Orthopaedic Surgery

## 2023-05-12 DIAGNOSIS — E785 Hyperlipidemia, unspecified: Secondary | ICD-10-CM | POA: Diagnosis not present

## 2023-05-12 DIAGNOSIS — R7303 Prediabetes: Secondary | ICD-10-CM | POA: Diagnosis not present

## 2023-05-15 ENCOUNTER — Ambulatory Visit: Payer: Medicare HMO | Admitting: Orthopaedic Surgery

## 2023-06-02 ENCOUNTER — Ambulatory Visit (INDEPENDENT_AMBULATORY_CARE_PROVIDER_SITE_OTHER): Payer: No Typology Code available for payment source | Admitting: Orthopaedic Surgery

## 2023-06-02 ENCOUNTER — Other Ambulatory Visit (INDEPENDENT_AMBULATORY_CARE_PROVIDER_SITE_OTHER): Payer: Self-pay

## 2023-06-02 ENCOUNTER — Encounter: Payer: Self-pay | Admitting: Orthopaedic Surgery

## 2023-06-02 DIAGNOSIS — Z96641 Presence of right artificial hip joint: Secondary | ICD-10-CM

## 2023-06-02 NOTE — Progress Notes (Signed)
 The patient is here at 10 months status post a right total hip replacement.  She says she is doing great and has no issues with her right hip.  She denies any left hip pain.  Examination of both hips shows it moves equally with smooth fluid motion with no blocks to rotation especially on her right operative hip.  She does report that her ADLs are easier to do now with her hip replacement.  She does walk with a normal gait and her leg lengths appear equal.  An AP pelvis and lateral of the right hip shows a well-seated right total hip arthroplasty with no complicating features.  At this point follow-up for her hip can be as needed.  All questions and concerns were addressed and answered.  She knows that if she develops any issues with that hip she should not hesitate to reach out to [place].

## 2023-06-09 DIAGNOSIS — Z87891 Personal history of nicotine dependence: Secondary | ICD-10-CM | POA: Diagnosis not present

## 2023-06-09 DIAGNOSIS — M47816 Spondylosis without myelopathy or radiculopathy, lumbar region: Secondary | ICD-10-CM | POA: Diagnosis not present

## 2023-06-09 DIAGNOSIS — M5416 Radiculopathy, lumbar region: Secondary | ICD-10-CM | POA: Diagnosis not present

## 2023-06-09 DIAGNOSIS — I10 Essential (primary) hypertension: Secondary | ICD-10-CM | POA: Diagnosis not present

## 2023-06-09 DIAGNOSIS — G894 Chronic pain syndrome: Secondary | ICD-10-CM | POA: Diagnosis not present

## 2023-06-09 DIAGNOSIS — M48061 Spinal stenosis, lumbar region without neurogenic claudication: Secondary | ICD-10-CM | POA: Diagnosis not present

## 2023-06-09 DIAGNOSIS — R030 Elevated blood-pressure reading, without diagnosis of hypertension: Secondary | ICD-10-CM | POA: Diagnosis not present

## 2023-06-18 DIAGNOSIS — R519 Headache, unspecified: Secondary | ICD-10-CM | POA: Diagnosis not present

## 2023-06-18 DIAGNOSIS — I10 Essential (primary) hypertension: Secondary | ICD-10-CM | POA: Diagnosis not present

## 2023-06-26 DIAGNOSIS — E663 Overweight: Secondary | ICD-10-CM | POA: Diagnosis not present

## 2023-06-26 DIAGNOSIS — M1990 Unspecified osteoarthritis, unspecified site: Secondary | ICD-10-CM | POA: Diagnosis not present

## 2023-06-26 DIAGNOSIS — I10 Essential (primary) hypertension: Secondary | ICD-10-CM | POA: Diagnosis not present

## 2023-06-26 DIAGNOSIS — F17211 Nicotine dependence, cigarettes, in remission: Secondary | ICD-10-CM | POA: Diagnosis not present

## 2023-06-26 DIAGNOSIS — Z1211 Encounter for screening for malignant neoplasm of colon: Secondary | ICD-10-CM | POA: Diagnosis not present

## 2023-06-26 DIAGNOSIS — Z6826 Body mass index (BMI) 26.0-26.9, adult: Secondary | ICD-10-CM | POA: Diagnosis not present

## 2023-06-26 DIAGNOSIS — R7303 Prediabetes: Secondary | ICD-10-CM | POA: Diagnosis not present

## 2023-06-26 DIAGNOSIS — Z008 Encounter for other general examination: Secondary | ICD-10-CM | POA: Diagnosis not present

## 2023-07-15 DIAGNOSIS — I10 Essential (primary) hypertension: Secondary | ICD-10-CM | POA: Diagnosis not present

## 2023-07-15 DIAGNOSIS — N3281 Overactive bladder: Secondary | ICD-10-CM | POA: Diagnosis not present

## 2023-08-05 DIAGNOSIS — I10 Essential (primary) hypertension: Secondary | ICD-10-CM | POA: Diagnosis not present

## 2023-08-05 DIAGNOSIS — Z0001 Encounter for general adult medical examination with abnormal findings: Secondary | ICD-10-CM | POA: Diagnosis not present

## 2023-08-05 DIAGNOSIS — N3281 Overactive bladder: Secondary | ICD-10-CM | POA: Diagnosis not present

## 2023-08-05 DIAGNOSIS — Z130 Encounter for screening for diseases of the blood and blood-forming organs and certain disorders involving the immune mechanism: Secondary | ICD-10-CM | POA: Diagnosis not present

## 2023-08-05 DIAGNOSIS — M25551 Pain in right hip: Secondary | ICD-10-CM | POA: Diagnosis not present

## 2023-08-05 DIAGNOSIS — Z13220 Encounter for screening for lipoid disorders: Secondary | ICD-10-CM | POA: Diagnosis not present

## 2023-08-05 DIAGNOSIS — M5459 Other low back pain: Secondary | ICD-10-CM | POA: Diagnosis not present

## 2023-08-05 DIAGNOSIS — Z6826 Body mass index (BMI) 26.0-26.9, adult: Secondary | ICD-10-CM | POA: Diagnosis not present

## 2023-08-05 DIAGNOSIS — Z713 Dietary counseling and surveillance: Secondary | ICD-10-CM | POA: Diagnosis not present

## 2023-08-05 DIAGNOSIS — E663 Overweight: Secondary | ICD-10-CM | POA: Diagnosis not present

## 2023-09-10 ENCOUNTER — Encounter: Payer: Self-pay | Admitting: Orthopedic Surgery

## 2023-09-10 ENCOUNTER — Ambulatory Visit: Admitting: Orthopedic Surgery

## 2023-09-10 ENCOUNTER — Other Ambulatory Visit (INDEPENDENT_AMBULATORY_CARE_PROVIDER_SITE_OTHER): Payer: Self-pay

## 2023-09-10 VITALS — BP 146/89 | HR 73 | Ht 66.0 in | Wt 164.0 lb

## 2023-09-10 DIAGNOSIS — M25512 Pain in left shoulder: Secondary | ICD-10-CM | POA: Diagnosis not present

## 2023-09-10 DIAGNOSIS — G8929 Other chronic pain: Secondary | ICD-10-CM | POA: Diagnosis not present

## 2023-09-10 DIAGNOSIS — Z96612 Presence of left artificial shoulder joint: Secondary | ICD-10-CM | POA: Diagnosis not present

## 2023-09-10 MED ORDER — PREDNISONE 10 MG (21) PO TBPK: ORAL_TABLET | ORAL | 0 refills | Status: DC

## 2023-09-10 NOTE — Progress Notes (Unsigned)
 Orthopaedic Postop Note  Assessment: Kathryn Woods is a 73 y.o. female s/p Left Reverse Shoulder Arthroplasty, in setting of failed rotator cuff repair  DOS: 02/22/2021  Plan: [name] is doing much better since she was last seen in clinic.  The pain she experienced is completely resolved.  She is able to do over then she wants to do with her left shoulder.  No issues with her incisions.  I urged her to continue working on exercises, she can see improvements up to a full year after surgery.  She stated understanding.  I will see her back in 6 months.   Follow-up: Return in about 2 weeks (around 09/24/2023).  XR at next visit: Left shoulder  Subjective:  Chief Complaint  Patient presents with   hand numb    Left hand index middle ring fingers and thumb are numb  Denies neck pain   Shoulder Pain    Left painful ROM     History of Present Illness: Kathryn Woods is a 73 y.o. female who presents following the above stated procedure.  Surgery was approximately 5-6 months ago.  I saw her about a month ago, and she was having a lot of pain in her left shoulder, which was brought on by some of her exercises.  Since then, the pain is improved.  She is able to do her exercises again.  She is not having any issues at this time.  Whatever was causing her pain in the last time, has improved.  Review of Systems: No fevers or chills No numbness or tingling No Chest Pain No shortness of breath   Objective: BP (!) 146/89   Pulse 73   Ht 5\' 6"  (1.676 m)   Wt 164 lb (74.4 kg)   BMI 26.47 kg/m   Physical Exam:  Alert and oriented.  No acute distress.  Surgical incision is healing well.  No surrounding erythema or drainage. Active forward flexion to 140 degrees.  Abduction to 100 degrees.  No tenderness to palpation of the [facility] joint, or the acromion.  No tenderness to palpation of the posterior shoulder.  Fingers are warm and well-perfused.  She was able to put  her arms behind her, and help her self out of the chair, without discomfort.  No numbness or tingling distally.  IMAGING: I personally ordered and reviewed the following images:  No new x-rays obtained today.  Tonita Frater, MD 09/10/2023 11:47 AM

## 2023-09-10 NOTE — Patient Instructions (Addendum)
 We have ordered some labs, please get this blood work done as soon as possible.    Take the medicine as prescribed

## 2023-09-19 ENCOUNTER — Telehealth: Payer: Self-pay | Admitting: Orthopedic Surgery

## 2023-09-19 NOTE — Telephone Encounter (Signed)
 Dr. Ernesta Heading pt - spoke w/[facility], the pt is there now for her labs and they do not have an order.  Per Dr. Glenice Lang note on 09/10/23 - We will obtain basic labs including a CBC, ESR and CRP.  If you need to fax the order, send to [phone] and if you need to call, call [phone].

## 2023-09-19 NOTE — Telephone Encounter (Signed)
 Faxed to [facility]

## 2023-09-22 ENCOUNTER — Telehealth: Payer: Self-pay | Admitting: Orthopedic Surgery

## 2023-09-22 DIAGNOSIS — Z96612 Presence of left artificial shoulder joint: Secondary | ICD-10-CM

## 2023-09-22 DIAGNOSIS — M25512 Pain in left shoulder: Secondary | ICD-10-CM

## 2023-09-22 NOTE — Telephone Encounter (Signed)
 Dr. Ernesta Heading pt - spoke w/the pt, she is wanting her lab orders sent to [facility].  She stated she went the other day and had to wait 2 hours and now she has to go back and she rescheduled her appointment with Dr. Tita Form.

## 2023-09-23 ENCOUNTER — Telehealth: Payer: Self-pay | Admitting: Orthopedic Surgery

## 2023-09-23 MED ORDER — TRAMADOL HCL 50 MG PO TABS: 50.0000 mg | ORAL_TABLET | Freq: Three times a day (TID) | ORAL | 0 refills | Status: DC | PRN

## 2023-09-23 NOTE — Telephone Encounter (Signed)
 Labs sent to [facility], pt notified.

## 2023-09-23 NOTE — Telephone Encounter (Signed)
 Dr. Ernesta Heading pt - pt was scheduled for 6/25 and we had to reschedule her bc Dr. Tita Form will be in surgery that morning.  She is rescheduled for 10/07/23 at 9:45am.  She is requesting something for pain to be sent to [facility] in [place].

## 2023-09-24 ENCOUNTER — Ambulatory Visit: Admitting: Orthopedic Surgery

## 2023-10-01 ENCOUNTER — Ambulatory Visit: Admitting: Orthopedic Surgery

## 2023-10-03 LAB — CBC WITH DIFFERENTIAL/PLATELET
Basophils Absolute: 0 10*3/uL (ref 0.0–0.2)
Basos: 0 %
EOS (ABSOLUTE): 0.4 10*3/uL (ref 0.0–0.4)
Eos: 3 %
Hematocrit: 41.7 % (ref 34.0–46.6)
Hemoglobin: 13.6 g/dL (ref 11.1–15.9)
Immature Grans (Abs): 0 10*3/uL (ref 0.0–0.1)
Immature Granulocytes: 0 %
Lymphocytes Absolute: 2.3 10*3/uL (ref 0.7–3.1)
Lymphs: 21 %
MCH: 29.7 pg (ref 26.6–33.0)
MCHC: 32.6 g/dL (ref 31.5–35.7)
MCV: 91 fL (ref 79–97)
Monocytes Absolute: 0.8 10*3/uL (ref 0.1–0.9)
Monocytes: 7 %
Neutrophils Absolute: 7.7 10*3/uL — ABNORMAL HIGH (ref 1.4–7.0)
Neutrophils: 69 %
Platelets: 315 10*3/uL (ref 150–450)
RBC: 4.58 x10E6/uL (ref 3.77–5.28)
RDW: 15.3 % (ref 11.7–15.4)
WBC: 11.2 10*3/uL — ABNORMAL HIGH (ref 3.4–10.8)

## 2023-10-03 LAB — C-REACTIVE PROTEIN: CRP: 101 mg/L — ABNORMAL HIGH (ref 0–10)

## 2023-10-03 LAB — SEDIMENTATION RATE: Sed Rate: 25 mm/h (ref 0–40)

## 2023-10-07 ENCOUNTER — Ambulatory Visit: Admitting: Orthopedic Surgery

## 2023-10-07 ENCOUNTER — Encounter: Payer: Self-pay | Admitting: Orthopedic Surgery

## 2023-10-07 VITALS — BP 132/75 | HR 83 | Ht 66.0 in | Wt 164.0 lb

## 2023-10-07 DIAGNOSIS — Z96612 Presence of left artificial shoulder joint: Secondary | ICD-10-CM | POA: Diagnosis not present

## 2023-10-07 DIAGNOSIS — M25512 Pain in left shoulder: Secondary | ICD-10-CM | POA: Diagnosis not present

## 2023-10-07 MED ORDER — IBUPROFEN 600 MG PO TABS: 600.0000 mg | ORAL_TABLET | Freq: Three times a day (TID) | ORAL | 0 refills | Status: DC | PRN

## 2023-10-07 NOTE — Progress Notes (Signed)
 Orthopaedic Postop Note  Assessment: Kathryn Woods is a 73 y.o. female s/p Left Reverse Shoulder Arthroplasty, in setting of failed rotator cuff repair  DOS: 02/22/2021  Plan: Kathryn Woods continues to have pain over the anterior left shoulder.  Prednisone  was helpful.  Basic labs demonstrated a mildly elevated white count, as well as an elevated CRP.  I would like to obtain an aspiration of the left shoulder, to help rule out an infection.  I am optimistic that the location of her pain is more consistent with impingement.  This was discussed with the patient.  She states understanding.  I provided her with a prescription for ibuprofen , which she can take.  This could be helpful for any inflammation, especially if there is impingement.  Once the results from the aspiration are available, I will see her in clinic to discuss the findings.  Aspiration will assess for cell count, and send for culture.  Cultures will need to be kept in for an extended period of time.  Follow-up: Return for After labs/test.  XR at next visit: Left shoulder  Subjective:  Chief Complaint  Patient presents with   Hand Problem    States left thumb is numb     History of Present Illness: Kathryn Woods is a 73 y.o. female who presents following the above stated procedure.  Left reverse shoulder arthroplasty was completed greater than 2 years ago.  For the past 2+ months, she has been having pain in the anterior shoulder.  Nothing specific caused the pain.  There is also some numbness and tingling in the left hand, which started with the pain in her shoulder.  She has obtained labs, and is here to discuss the findings.  Recent prednisone  improved her symptoms.  Her motion is better overall.  Her health has remained stable.  No recent fevers or chills.  No burning with urination.  Review of Systems: No fevers or chills No numbness or tingling No Chest Pain No shortness of  breath   Objective: BP 132/75   Pulse 83   Ht 5' 6 (1.676 m)   Wt 164 lb (74.4 kg)   BMI 26.47 kg/m   Physical Exam:  Alert and oriented.  No acute distress.  Anterior surgical incision is healed.  No surrounding erythema or drainage.  She has tenderness to palpation directly over the coracoid..  No tenderness to palpation of the trapezius.  No tenderness in the left side of her neck.  Negative Tinel's at the wrist.  Negative Phalen's.  Fingers are warm and well-perfused.  Sensation intact in the axillary nerve distribution.     IMAGING: I personally ordered and reviewed the following images:  No new imaging obtained today.  [name], MD 10/07/2023 10:16 AM

## 2023-10-07 NOTE — Addendum Note (Signed)
 Addended by: VICENTA EMMIE HERO on: 10/07/2023 10:33 AM   Modules accepted: Orders

## 2023-10-13 ENCOUNTER — Telehealth: Payer: Self-pay | Admitting: Orthopedic Surgery

## 2023-10-13 NOTE — Telephone Encounter (Signed)
 Dr. Onesimo pt - pt lvm stating that Dr. JAYSON ordered a procedure for her at [facility] and no one has called her and he told her to contact us  if no one called her.  [phone]

## 2023-10-13 NOTE — Telephone Encounter (Signed)
 I called central scheduling, scheduled her for Friday 7/11, check in 1045 for an 11 am appt. Kathryn Woods.  I called patient NA, LMVM for her advising.

## 2023-10-13 NOTE — Telephone Encounter (Signed)
 Aspiration will assess for cell count, and send for culture.   That's what he ordered can you help with this urgently I m in clinic  Dr VEAR not happy

## 2023-10-17 ENCOUNTER — Ambulatory Visit (HOSPITAL_COMMUNITY)
Admission: RE | Admit: 2023-10-17 | Discharge: 2023-10-17 | Disposition: A | Discharge status: Home / Self Care | Source: Ambulatory Visit | Attending: Orthopedic Surgery | Admitting: Orthopedic Surgery

## 2023-10-17 DIAGNOSIS — Z96612 Presence of left artificial shoulder joint: Secondary | ICD-10-CM | POA: Diagnosis present

## 2023-10-17 DIAGNOSIS — M25512 Pain in left shoulder: Secondary | ICD-10-CM | POA: Diagnosis present

## 2023-10-17 LAB — SYNOVIAL CELL COUNT + DIFF, W/ CRYSTALS
Crystals, Fluid: NONE SEEN
WBC, Synovial: 4 /mm3 (ref 0–200)

## 2023-10-17 MED ORDER — LIDOCAINE HCL (PF) 1 % IJ SOLN
5.0000 mL | Freq: Once | INTRAMUSCULAR | Status: AC
  Administered 2023-10-17: 5 mL via INTRADERMAL

## 2023-10-17 MED ORDER — LIDOCAINE HCL (PF) 1 % IJ SOLN
INTRAMUSCULAR | Status: AC
  Filled 2023-10-17: qty 5

## 2023-10-17 MED ORDER — LIDOCAINE 1 % OPTIME INJ - NO CHARGE
5.0000 mL | Freq: Once | INTRAMUSCULAR | Status: DC
  Filled 2023-10-17: qty 6

## 2023-10-17 NOTE — Procedures (Addendum)
 Radiology Procedure Note  Risks and benefits of joint injection were discussed with the patient including, but not limited to bleeding, infection, damage to adjacent structures, and low yield.    All of the patient's questions were answered, patient is agreeable to proceed. Consent signed and in chart.  A timeout was performed with all members of the team prior to start of the procedure. Correct patient and correct procedure was confirmed. Allergies were reviewed.   PROCEDURE SUMMARY:  Attempted fluoro guided left shoulder aspiration.  No fluid was aspirated. The lefy shoulder was lavaged with 5 mL sterile saline, only allowed aspiration of 1 mL of fluid.  Fluid was sent for labs per request.  Send for culture if only one lab can be performed per ordering provider.   No immediate complications.  Patient tolerated well.   EBL = trace  Please see full dictation in imaging section of Epic for procedure details.  Gaege Sangalang H Jeremia Groot PA-C 10/17/2023 3:22 PM

## 2023-10-21 LAB — BODY FLUID CULTURE W GRAM STAIN
Culture: NO GROWTH
Gram Stain: NONE SEEN

## 2023-10-22 ENCOUNTER — Telehealth: Payer: Self-pay | Admitting: Orthopedic Surgery

## 2023-10-22 NOTE — Telephone Encounter (Signed)
 Dr. Onesimo pt - pt lvm stating that she got a text message stating her provider wants her to have lab work done and she had it done last week.  She would like a call to explain what is going on [phone]

## 2023-10-28 ENCOUNTER — Telehealth: Payer: Self-pay | Admitting: Orthopedic Surgery

## 2023-10-28 NOTE — Telephone Encounter (Signed)
 Dr. Onesimo pt - pt lvm stating she would like the results from the fluid from her shoulder - [phone]

## 2023-10-28 NOTE — Telephone Encounter (Signed)
 Information from Dr. Onesimo referred to pt and she verbalized understanding.     Pt would like to get an appointment w/ Dr. Margrette for her knee. Please assist.

## 2023-10-30 NOTE — Telephone Encounter (Signed)
 Called the pt, lvm for her to cb and schedule.  Per a message from Leta, the pt would like an appointment with Dr. VEAR for her knee.

## 2023-11-06 ENCOUNTER — Other Ambulatory Visit (INDEPENDENT_AMBULATORY_CARE_PROVIDER_SITE_OTHER): Payer: Self-pay

## 2023-11-06 ENCOUNTER — Encounter: Payer: Self-pay | Admitting: Orthopedic Surgery

## 2023-11-06 ENCOUNTER — Ambulatory Visit: Admitting: Orthopedic Surgery

## 2023-11-06 VITALS — BP 124/78 | HR 116 | Ht 66.0 in | Wt 163.0 lb

## 2023-11-06 DIAGNOSIS — M25561 Pain in right knee: Secondary | ICD-10-CM

## 2023-11-06 DIAGNOSIS — M1711 Unilateral primary osteoarthritis, right knee: Secondary | ICD-10-CM

## 2023-11-06 DIAGNOSIS — R202 Paresthesia of skin: Secondary | ICD-10-CM

## 2023-11-06 DIAGNOSIS — G8929 Other chronic pain: Secondary | ICD-10-CM

## 2023-11-06 MED ORDER — METHYLPREDNISOLONE ACETATE 40 MG/ML IJ SUSP
40.0000 mg | Freq: Once | INTRAMUSCULAR | Status: AC
  Administered 2023-11-06: 40 mg via INTRA_ARTICULAR

## 2023-11-06 NOTE — Addendum Note (Signed)
 Addended byBETHA JENEAN GREIG LELON on: 11/06/2023 02:41 PM   Modules accepted: Orders

## 2023-11-06 NOTE — Progress Notes (Signed)
   Chief Complaint  Patient presents with   Knee Pain    Right    73 year old female with history of right knee arthroscopy and lateral meniscectomy comes in with worsening right knee pain and some swelling.  Denies any trauma.  Complains of catching and giving way with superomedial patellar pain and crepitance and grinding  Currently on ibuprofen  takes occasional tramadol  which does not control her pain  She is having some numbness in the thumb and index finger of her left arm.  She had a reverse shoulder replacement did well with that but over the last year or so she has had numbness in the left upper extremity and weakness in shoulder abduction  I examined her neck she had normal range of motion negative Spurling sign her previous cervical spine x-ray from several years ago showed degeneration I suspect she will need an MRI of her C-spine  As far as the knee goes she has small joint effusion, flexion relieves the knee pain she has pain at 90 degrees flexion out to full extension especially in the superomedial portion of the knee  There is no instability  Extensor mechanism is intact  Medical history was reviewed  Imaging studies show worsening lateral compartment arthrosis slight increase in valgus alignment to the right knee all 3 compartments have degenerative changes  DG Knee AP/LAT W/Sunrise Right Result Date: 11/06/2023 X-rays right knee Chronic knee pain history of arthroscopy increased pain and swelling X-ray shows increased valgus alignment with worsening narrowing of the lateral compartment.  There are osteophyte surrounding all 3 joint areas.  There is a posterior osteophyte on the tibia there is an osteophyte at the superior aspect of the patella there are periarticular osteophytes around the medial and lateral femur.  Patellofemoral alignment is normal There is also notable osteophytes laterally The x-ray has shown worsening of the disease that was noted at the time of the  last arthroscopy and x-ray Impression grade 3-4 arthritis of the right knee with valgus alignment     Encounter Diagnoses  Name Primary?   Chronic pain of right knee Yes   Primary osteoarthritis of right knee    We discussed treatment options and I told her the knee surgery is elective we went over several treatment options that did not involve surgery included but not limited to topical medication oral anti-inflammatories, Tylenol , ibuprofen , bracing, physical therapy, she would like to undergo injection at this time  Return in 2 months  Encounter Diagnoses  Name Primary?   Chronic pain of right knee Yes   Primary osteoarthritis of right knee      Procedure note right knee injection   verbal consent was obtained to inject right knee joint  Timeout was completed to confirm the site of injection  The medications used were depomedrol 40 mg and 1% lidocaine  3 cc Anesthesia was provided by ethyl chloride and the skin was prepped with alcohol.  After cleaning the skin with alcohol a 20-gauge needle was used to inject the right knee joint. There were no complications. A sterile bandage was applied.   The operative findings are    Medial normal medial compartment Lateral grade 4 chondral changes on the tibial plateau and femur with a tear of the lateral meniscus at the popliteus hiatus. Patellofemoral grade II chondromalacia Notch normal ACL PCL

## 2023-11-06 NOTE — Progress Notes (Signed)
   BP 124/78   Pulse (!) 116   Ht 5' 6 (1.676 m)   Wt 163 lb (73.9 kg)   BMI 26.31 kg/m   Body mass index is 26.31 kg/m.  Chief Complaint  Patient presents with   Knee Pain    Right     No diagnosis found.  DOI/DOS/ Date: ongoing  Worse     (Patient has d/c tramadol  states not working)

## 2023-11-20 ENCOUNTER — Telehealth: Payer: Self-pay | Admitting: Orthopedic Surgery

## 2023-11-20 NOTE — Telephone Encounter (Signed)
 Dr. Areatha pt - spoke w/the pt, she requested to speak w/Dr. H, I told her he was in clinic right now, was there something I could help her with.  She stated no, she needed to speak w/him.  She stated it was something they discussed at her last visit about her knee, that's all she'd give me. [phone]

## 2023-12-02 ENCOUNTER — Telehealth: Payer: Self-pay | Admitting: Orthopedic Surgery

## 2023-12-02 NOTE — Telephone Encounter (Signed)
 Dr. Areatha pt - spoke w/the pt, she was quite irritated.  She stated that no one has called her with her results, stated she had something drawn off to be tested.  Explained that someone would give her a call tomorrow, she verbalized understanding.  [phone]

## 2023-12-05 NOTE — Telephone Encounter (Signed)
 Im sorry it does look like Dr Onesimo ordered it along with some blood work  Materials engineer

## 2023-12-05 NOTE — Telephone Encounter (Signed)
 I can call her, you sent her for her shoulder aspirated, ok to tell her no growth no infection?

## 2023-12-14 ENCOUNTER — Telehealth: Payer: Self-pay | Admitting: Orthopedic Surgery

## 2023-12-14 NOTE — Telephone Encounter (Signed)
 Dr. Bertina pt for the shoudler - pt lvm 12/12/23 after hours requesting pain meds for her shoulder, she stated no one has yet to call her back w/the results of her culture.  She stated she would appreciate a call back.  [phone]

## 2023-12-15 NOTE — Telephone Encounter (Signed)
 Tried to call the pt, I get Your call cannot be completed at this time

## 2023-12-15 NOTE — Telephone Encounter (Signed)
 If she calls back, I can tell her the results But she needs appointment with Dr Onesimo to review and discuss CT scan

## 2023-12-19 ENCOUNTER — Telehealth: Payer: Self-pay | Admitting: Orthopedic Surgery

## 2023-12-19 MED ORDER — TRAMADOL HCL 50 MG PO TABS: 50.0000 mg | ORAL_TABLET | Freq: Four times a day (QID) | ORAL | 0 refills | Status: DC | PRN

## 2023-12-19 NOTE — Telephone Encounter (Signed)
 Dr. Onesimo pt - pt lvm stating she really needs Dr. JAYSON to call something in for pain.  She stated she is getting very frustrated.

## 2023-12-19 NOTE — Telephone Encounter (Addendum)
 She is supposed to see Dr Onesimo in follow up per our previous messaging but she has not scheduled yet Dr Onesimo and I have both tried to call her and she has not answered phone

## 2023-12-19 NOTE — Addendum Note (Signed)
 Addended by: ONESIMO ANES A on: 12/19/2023 12:38 PM   Modules accepted: Orders

## 2023-12-19 NOTE — Telephone Encounter (Signed)
 I able to get her today, she is scheduled for 12/30/23 and would really like some pain medication called in.

## 2023-12-20 ENCOUNTER — Other Ambulatory Visit: Payer: Self-pay | Admitting: Orthopedic Surgery

## 2023-12-22 NOTE — Telephone Encounter (Signed)
 Kathryn Woods got her appointment Dr Onesimo sent in some Tramadol 

## 2023-12-30 ENCOUNTER — Ambulatory Visit (INDEPENDENT_AMBULATORY_CARE_PROVIDER_SITE_OTHER): Admitting: Orthopedic Surgery

## 2023-12-30 DIAGNOSIS — M25512 Pain in left shoulder: Secondary | ICD-10-CM | POA: Diagnosis not present

## 2023-12-30 DIAGNOSIS — G8929 Other chronic pain: Secondary | ICD-10-CM

## 2023-12-30 DIAGNOSIS — Z96612 Presence of left artificial shoulder joint: Secondary | ICD-10-CM | POA: Diagnosis not present

## 2023-12-30 NOTE — Patient Instructions (Addendum)
 We will order a CT scan for your left shoulder  Once you have a date scheduled for the CT scan, please contact the office to schedule a follow-up appointment  While we are working on your approval for CT please go ahead and call to schedule your appointment with [facility] within at least one (1) week.   Central Scheduling [phone]

## 2023-12-31 ENCOUNTER — Encounter: Payer: Self-pay | Admitting: Orthopedic Surgery

## 2023-12-31 NOTE — Progress Notes (Signed)
 Orthopaedic Postop Note  Assessment: Kathryn Woods is a 73 y.o. female s/p Left Reverse Shoulder Arthroplasty, in setting of failed rotator cuff repair  DOS: 02/22/2021  Plan: Kathryn Woods continues to have pain over the anterior left shoulder.  She continues to have some abnormalities of her labs.  Culture results from the shoulder were negative.  No growth.  At this point, I would like to obtain a CT scan to further evaluate the left reverse shoulder arthroplasty.  She states understanding.  When she has a date for the CT scan, she will contact the clinic to schedule a follow-up appointment.  Follow-up: No follow-ups on file.  XR at next visit: Left shoulder  Subjective:  Chief Complaint  Patient presents with   Shoulder Pain    L RSA DOS 02/22/21. Still having significant pain     History of Present Illness: Kathryn Woods is a 73 y.o. female who presents following the above stated procedure.  Left reverse shoulder arthroplasty was completed almost 3 years ago.  She continues to have pain.  She has had some abnormal labs.  Previous aspiration was negative for bacterial growth.  Continues to have pain in the anterior shoulder.  She has difficulty with overhead motion.   Review of Systems: No fevers or chills No numbness or tingling No Chest Pain No shortness of breath   Objective: There were no vitals taken for this visit.  Physical Exam:  Alert and oriented.  No acute distress.  Anterior surgical incision is healed.  No surrounding erythema or drainage.  She has tenderness to palpation directly over the coracoid and the anterior shoulder.  No tenderness to palpation of the trapezius.  No tenderness in the left side of her neck.  Negative Tinel's at the wrist.  Negative Phalen's.  Fingers are warm and well-perfused.  Sensation intact in the axillary nerve distribution.     IMAGING: I personally ordered and reviewed the following images:  No  new imaging obtained today.  Oneil Kathryn Horde, MD 12/31/2023 3:31 PM

## 2024-01-05 ENCOUNTER — Ambulatory Visit (HOSPITAL_COMMUNITY)
Admission: RE | Admit: 2024-01-05 | Discharge: 2024-01-05 | Disposition: A | Discharge status: Home / Self Care | Source: Ambulatory Visit | Attending: Orthopedic Surgery | Admitting: Orthopedic Surgery

## 2024-01-05 ENCOUNTER — Ambulatory Visit: Admitting: Orthopedic Surgery

## 2024-01-05 DIAGNOSIS — Z96612 Presence of left artificial shoulder joint: Secondary | ICD-10-CM | POA: Insufficient documentation

## 2024-01-13 ENCOUNTER — Ambulatory Visit (INDEPENDENT_AMBULATORY_CARE_PROVIDER_SITE_OTHER): Admitting: Orthopedic Surgery

## 2024-01-13 DIAGNOSIS — R202 Paresthesia of skin: Secondary | ICD-10-CM

## 2024-01-13 DIAGNOSIS — Z96612 Presence of left artificial shoulder joint: Secondary | ICD-10-CM | POA: Diagnosis not present

## 2024-01-13 NOTE — Progress Notes (Unsigned)
 Orthopaedic Postop Note  Assessment: Kathryn Woods is a 73 y.o. female s/p Left Reverse Shoulder Arthroplasty, in setting of failed rotator cuff repair  DOS: 02/22/2021  Plan: Kathryn Woods continues to have pain over the anterior left shoulder.  She continues to have some abnormalities of her labs.  Culture results from the shoulder were negative.  No growth.  At this point, I would like to obtain a CT scan to further evaluate the left reverse shoulder arthroplasty.  She states understanding.  When she has a date for the CT scan, she will contact the clinic to schedule a follow-up appointment.  Follow-up: Return for After EMG.  XR at next visit: Left shoulder  Subjective:  Chief Complaint  Patient presents with   Results    L Shoulder CT Scan    History of Present Illness: Kathryn Woods is a 73 y.o. female who presents following the above stated procedure.  Left reverse shoulder arthroplasty was completed almost 3 years ago.  She continues to have pain.  She has had some abnormal labs.  Previous aspiration was negative for bacterial growth.  Continues to have pain in the anterior shoulder.  She has difficulty with overhead motion.   Review of Systems: No fevers or chills No numbness or tingling No Chest Pain No shortness of breath   Objective: There were no vitals taken for this visit.  Physical Exam:  Alert and oriented.  No acute distress.  Anterior surgical incision is healed.  No surrounding erythema or drainage.  She has tenderness to palpation directly over the coracoid and the anterior shoulder.  No tenderness to palpation of the trapezius.  No tenderness in the left side of her neck.  Negative Tinel's at the wrist.  Negative Phalen's.  Fingers are warm and well-perfused.  Sensation intact in the axillary nerve distribution.     IMAGING: I personally ordered and reviewed the following images:  No new imaging obtained today.  [name], MD 01/13/2024 11:29 AM

## 2024-01-14 ENCOUNTER — Encounter: Payer: Self-pay | Admitting: Orthopedic Surgery

## 2024-01-15 ENCOUNTER — Ambulatory Visit (INDEPENDENT_AMBULATORY_CARE_PROVIDER_SITE_OTHER): Admitting: Orthopedic Surgery

## 2024-01-15 DIAGNOSIS — M1711 Unilateral primary osteoarthritis, right knee: Secondary | ICD-10-CM | POA: Diagnosis not present

## 2024-01-15 DIAGNOSIS — G8929 Other chronic pain: Secondary | ICD-10-CM

## 2024-01-15 NOTE — Patient Instructions (Signed)
 While we are working on your approval for MRI please go ahead and call to schedule your appointment with [facility] within at least one (1) week.   Central Scheduling [phone]

## 2024-01-15 NOTE — Progress Notes (Signed)
 Chief Complaint  Patient presents with   Knee Pain    R     73 year old female with valgus osteoarthritis right knee comes in with medial knee pain  Patient is having difficulty with her right knee with severe intermittent not constant nonweightbearing pain seems to bother her the most after she does activity and then sits down.  She said and I I have to manipulate the knee into position to get relief.  It seems like when I sit down after activities when I get the pain  On exam she is awake and alert she is oriented x 3 the right knee has tenderness on the medial side nontender on the lateral side  Medial condyle is not tender but medial joint line is.  McMurray's test shows discomfort no click no pop  Varus valgus stress test normal ACL PCL intact distal neurovascular function normal  Going over her images she has osteoarthritis lateral compartment  Rule out medial meniscus tear.  MRI.  Follow-up after MRI.

## 2024-01-15 NOTE — Progress Notes (Signed)
    01/15/2024   Chief Complaint  Patient presents with   Knee Pain    R     No diagnosis found.  What pharmacy do you use ? [facility] Unchanged

## 2024-01-23 ENCOUNTER — Ambulatory Visit (HOSPITAL_COMMUNITY): Admission: RE | Admit: 2024-01-23 | Source: Ambulatory Visit

## 2024-01-26 ENCOUNTER — Telehealth: Payer: Self-pay | Admitting: Orthopedic Surgery

## 2024-01-26 NOTE — Telephone Encounter (Signed)
 Dr. Areatha pt - spoke w/the pt, she stated that [facility] cx her MRI and rs her bc they didn't have authorization.  She is requesting a call back.  [phone]

## 2024-01-28 NOTE — Telephone Encounter (Signed)
 The patient left another message wanting to see if this has been authorized yet, please call the pt at [phone]

## 2024-01-29 ENCOUNTER — Ambulatory Visit (HOSPITAL_COMMUNITY)
Admission: RE | Admit: 2024-01-29 | Discharge: 2024-01-29 | Disposition: A | Discharge status: Home / Self Care | Source: Ambulatory Visit | Attending: Orthopedic Surgery | Admitting: Orthopedic Surgery

## 2024-01-29 DIAGNOSIS — G8929 Other chronic pain: Secondary | ICD-10-CM | POA: Insufficient documentation

## 2024-01-29 DIAGNOSIS — M25561 Pain in right knee: Secondary | ICD-10-CM | POA: Insufficient documentation

## 2024-01-29 NOTE — Telephone Encounter (Signed)
 Kathryn Woods #[id number], valid 01/27/24 - 03/27/24 for MRI right knee

## 2024-02-03 ENCOUNTER — Ambulatory Visit (INDEPENDENT_AMBULATORY_CARE_PROVIDER_SITE_OTHER): Admitting: Physical Medicine and Rehabilitation

## 2024-02-03 DIAGNOSIS — R29898 Other symptoms and signs involving the musculoskeletal system: Secondary | ICD-10-CM

## 2024-02-03 DIAGNOSIS — R202 Paresthesia of skin: Secondary | ICD-10-CM | POA: Diagnosis not present

## 2024-02-03 DIAGNOSIS — M25512 Pain in left shoulder: Secondary | ICD-10-CM

## 2024-02-03 DIAGNOSIS — M79642 Pain in left hand: Secondary | ICD-10-CM | POA: Diagnosis not present

## 2024-02-03 DIAGNOSIS — M542 Cervicalgia: Secondary | ICD-10-CM

## 2024-02-03 DIAGNOSIS — G8929 Other chronic pain: Secondary | ICD-10-CM

## 2024-02-03 NOTE — Progress Notes (Unsigned)
 Pain Scale   Average Pain 7 Patient advising she has left hand numbness and tingling with some noted weakness. Patient is Right Hand Dominate.        +Driver, -BT, -Dye Allergies.

## 2024-02-03 NOTE — Progress Notes (Unsigned)
 Kathryn Woods - 73 y.o. female MRN [id number]  Date of birth: 10-16-1950  Office Visit Note: Visit Date: 02/03/2024 PCP: Vick Lurie, FNP (Inactive) Referred by: Onesimo Oneil LABOR, MD  Subjective: Chief Complaint  Patient presents with  . Left Hand - Pain, Numbness   HPI: Kathryn Woods is a 73 y.o. female who comes in today at the request of Dr. Oneil Onesimo for evaluation and management of chronic, worsening and severe pain, numbness and tingling in the Left upper extremities.  Patient is Right hand dominant.  pain over the anterior left shoulder.  We reviewed the CT scan together, which is without subsidence, lucency or other signs of loosening.  It noted some mild notching, but this is not readily apparent.  Unsure if this is causing her symptoms.  We briefly discussed the possibility of proceeding with a revision, secondary to an infection versus notching.  She states that her pain is not that severe currently.  Her knee is bothering her more, and she is considering total knee arthroplasty with Dr. Margrette.  She does note some ongoing pain in the left side of her neck, with numbness and tingling in the left hand.  This could be carpal tunnel syndrome or some radiculopathy.  We will obtain EMG results, and discuss the findings.      ROS Otherwise per HPI.  Assessment & Plan: Visit Diagnoses:    ICD-10-CM   1. Paresthesia of skin  R20.2        Plan: No additional findings.   Meds & Orders: No orders of the defined types were placed in this encounter.  No orders of the defined types were placed in this encounter.   Follow-up: No follow-ups on file.   Procedures: No procedures performed      Clinical History: No specialty comments available.   She reports that she quit smoking about 41 years ago. Her smoking use included cigarettes. She has never used smokeless tobacco. No results for input(s): HGBA1C, LABURIC in the last 8760  hours.  Objective:  VS:  HT:    WT:   BMI:     BP:   HR: bpm  TEMP: ( )  RESP:  Physical Exam  Ortho Exam  Imaging: No results found.  Past Medical/Family/Surgical/Social History: Medications & Allergies reviewed per EMR, new medications updated. Patient Active Problem List   Diagnosis Date Noted  . 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 Gastroesophageal reflux disease without esophagitis 12/26/2021  . Loss of appetite 12/26/2021  . Nausea 12/26/2021  . Overactive bladder 12/26/2021  . History of failed repair of rotator cuff 02/22/2021  . Essential hypertension 11/28/2020  . S/P arthroscopy of left shoulder RCR 08/19/19 08/25/2019  . Nontraumatic complete tear of left rotator cuff   . Abnormal finding on evaluation procedure 06/03/2019  . Abnormal levels of other serum enzymes 06/03/2019  . S/P right knee arthroscopy 02/25/2019 03/02/2019  . Complex tear of lateral meniscus of right knee as current injury   . Status post rotator cuff repair 10/15/2018 12/21/2018  . Biceps tendon rupture, proximal, right, subsequent encounter   . Primary osteoarthritis of right shoulder   . Labral tear of shoulder, degenerative, right   . Synovitis of right shoulder   . Spondylolisthesis at L3-L4 level 12/10/2016  . Elevated blood-pressure reading, without diagnosis of hypertension 11/06/2016  . Bursitis, shoulder 02/24/2013  . HYPERLIPIDEMIA 02/04/2007  . Sprain of wrist 02/04/2007  . Migraine headache 01/15/2007  .  BRONCHITIS, CHRONIC NOS 01/15/2007  . Constipation 01/15/2007  . IBS 01/15/2007  . DISC DISEASE, LUMBOSACRAL SPINE 01/15/2007  . LOW BACK PAIN, CHRONIC 01/15/2007  . MALAISE AND FATIGUE 01/15/2007   Past Medical History:  Diagnosis Date  . Arthritis    lumbar region, spondylolisthesis   . Hypercholesteremia    Family History  Problem Relation Age of Onset  . Heart attack Mother   . Cancer Father        lung   Past Surgical History:  Procedure  Laterality Date  . BACK SURGERY    . KNEE ARTHROSCOPY WITH LATERAL MENISECTOMY Right 02/25/2019   Procedure: RIGHT KNEE ARTHROSCOPY WITH LATERAL MENISECTOMY;  Surgeon: Margrette Taft BRAVO, MD;  Location: [facility];  Service: Orthopedics;  Laterality: Right;  . REVERSE SHOULDER ARTHROPLASTY Left 02/22/2021   Procedure: LEFT REVERSE SHOULDER ARTHROPLASTY;  Surgeon: Onesimo Oneil LABOR, MD;  Location: [facility];  Service: Orthopedics;  Laterality: Left;  . SHOULDER ARTHROSCOPY WITH OPEN ROTATOR CUFF REPAIR Right 10/15/2018   Procedure: SHOULDER ARTHROSCOPY WITH OPEN ROTATOR CUFF REPAIR;  Surgeon: Margrette Taft BRAVO, MD;  Location: [facility];  Service: Orthopedics;  Laterality: Right;  . SHOULDER ARTHROSCOPY WITH ROTATOR CUFF REPAIR Left 08/19/2019   Procedure: LEFT SHOULDER ARTHROSCOPY WITH ROTATOR CUFF REPAIR;  Surgeon: Margrette Taft BRAVO, MD;  Location: [facility];  Service: Orthopedics;  Laterality: Left;  With scalene block   . TOTAL HIP ARTHROPLASTY Right 08/02/2022   Procedure: RIGHT TOTAL HIP ARTHROPLASTY ANTERIOR APPROACH;  Surgeon: Vernetta Lonni GRADE, MD;  Location: [facility];  Service: Orthopedics;  Laterality: Right;  . TUBAL LIGATION     Social History   Occupational History  . Not on file  Tobacco Use  . Smoking status: Former    Current packs/day: 0.00    Types: Cigarettes    Quit date: 12/06/1982    Years since quitting: 41.1  . Smokeless tobacco: Never  Vaping Use  . Vaping status: Never Used  Substance and Sexual Activity  . Alcohol use: No  . Drug use: Not Currently    Types: Marijuana    Comment: a couple times- for pain relief, pt. informed to not use anymore before surgery   . Sexual activity: Not on file

## 2024-02-04 ENCOUNTER — Encounter: Payer: Self-pay | Admitting: Physical Medicine and Rehabilitation

## 2024-02-04 NOTE — Procedures (Signed)
 EMG & NCV Findings: Evaluation of the left median motor nerve showed prolonged distal onset latency (4.6 ms) and reduced amplitude (3.7 mV).  The left median (across palm) sensory nerve showed no response (Palm) and prolonged distal peak latency (4.1 ms).  All remaining nerves (as indicated in the following tables) were within normal limits.    All examined muscles (as indicated in the following table) showed no evidence of electrical instability.    Impression: The above electrodiagnostic study is ABNORMAL and reveals evidence of a moderate left median nerve entrapment at the wrist (carpal tunnel syndrome) affecting sensory and motor components.   There is no significant electrodiagnostic evidence of any other focal nerve entrapment, brachial plexopathy or cervical radiculopathy.   Recommendations: 1.  Follow-up with referring physician. 2.  Continue current management of symptoms. 3.  Continue use of resting splint at night-time and as needed during the day. 4.  Suggest surgical evaluation.  ___________________________ Prentice Masters FAAPMR Board Certified, American Board of Physical Medicine and Rehabilitation    Nerve Conduction Studies Anti Sensory Summary Table   Stim Site NR Peak (ms) Norm Peak (ms) P-T Amp (V) Norm P-T Amp Site1 Site2 Delta-P (ms) Dist (cm) Vel (m/s) Norm Vel (m/s)  Left Median Acr Palm Anti Sensory (2nd Digit)  32.1C  Wrist    *4.1 <3.6 14.5 >10 Wrist Palm  0.0    Palm *NR  <2.0          Left Radial Anti Sensory (Base 1st Digit)  31.7C  Wrist    2.2 <3.1 23.5  Wrist Base 1st Digit 2.2 0.0    Left Ulnar Anti Sensory (5th Digit)  33C  Wrist    3.2 <3.7 25.1 >15.0 Wrist 5th Digit 3.2 14.0 44 >38   Motor Summary Table   Stim Site NR Onset (ms) Norm Onset (ms) O-P Amp (mV) Norm O-P Amp Site1 Site2 Delta-0 (ms) Dist (cm) Vel (m/s) Norm Vel (m/s)  Left Median Motor (Abd Poll Brev)  32.3C  Wrist    *4.6 <4.2 *3.7 >5 Elbow Wrist 4.3 21.5 50 >50  Elbow    8.9   4.8         Left Ulnar Motor (Abd Dig Min)  32.8C  Wrist    2.4 <4.2 10.5 >3 B Elbow Wrist 3.2 20.0 63 >53  B Elbow    5.6  8.6  A Elbow B Elbow 1.4 10.0 71 >53  A Elbow    7.0  7.9          EMG   Side Muscle Nerve Root Ins Act Fibs Psw Amp Dur Poly Recrt Int Bruna Comment  Left 1stDorInt Ulnar C8-T1 Nml Nml Nml Nml Nml 0 Nml Nml   Left Abd Poll Brev Median C8-T1 Nml Nml Nml Nml Nml 0 Nml Nml   Left ExtDigCom   Nml Nml Nml Nml Nml 0 Nml Nml   Left Triceps Radial C6-7-8 Nml Nml Nml Nml Nml 0 Nml Nml   Left Deltoid Axillary C5-6 Nml Nml Nml Nml Nml 0 Nml Nml     Nerve Conduction Studies Anti Sensory Left/Right Comparison   Stim Site L Lat (ms) R Lat (ms) L-R Lat (ms) L Amp (V) R Amp (V) L-R Amp (%) Site1 Site2 L Vel (m/s) R Vel (m/s) L-R Vel (m/s)  Median Acr Palm Anti Sensory (2nd Digit)  32.1C  Wrist *4.1   14.5   Wrist [employer]  Radial Anti Sensory (Base 1st Digit)  31.7C  Wrist 2.2   23.5   Wrist Base 1st Digit     Ulnar Anti Sensory (5th Digit)  33C  Wrist 3.2   25.1   Wrist 5th Digit 44     Motor Left/Right Comparison   Stim Site L Lat (ms) R Lat (ms) L-R Lat (ms) L Amp (mV) R Amp (mV) L-R Amp (%) Site1 Site2 L Vel (m/s) R Vel (m/s) L-R Vel (m/s)  Median Motor (Abd Poll Brev)  32.3C  Wrist *4.6   *3.7   Elbow Wrist 50    Elbow 8.9   4.8         Ulnar Motor (Abd Dig Min)  32.8C  Wrist 2.4   10.5   B Elbow Wrist 63    B Elbow 5.6   8.6   A Elbow B Elbow 71    A Elbow 7.0   7.9            Waveforms:

## 2024-02-09 ENCOUNTER — Encounter: Payer: Self-pay | Admitting: Radiology

## 2024-02-12 ENCOUNTER — Ambulatory Visit: Admitting: Orthopedic Surgery

## 2024-02-13 ENCOUNTER — Encounter: Payer: Self-pay | Admitting: Orthopedic Surgery

## 2024-02-13 ENCOUNTER — Ambulatory Visit: Admitting: Orthopedic Surgery

## 2024-02-13 DIAGNOSIS — G5602 Carpal tunnel syndrome, left upper limb: Secondary | ICD-10-CM

## 2024-02-13 NOTE — H&P (View-Only) (Signed)
 Orthopaedic Clinic Return  Assessment: Kathryn Woods is a 73 y.o. female with the following: Moderate left carpal tunnel syndrome  Plan: Kathryn Woods has symptoms consistent with carpal tunnel syndrome.  She has numbness, tingling and shooting pains in the left hand.  EMG demonstrates moderate compression of the median nerve.  We discussed proceeding with surgery.  She is interested in carpal tunnel release, and we will work to schedule surgery for March 08, 2024.  Risks and benefits of the surgery, including, but not limited to infection, bleeding, persistent pain, need for further surgery, blood clots and more severe complications associated with anesthesia were discussed with the patient.  The patient has elected to proceed.    Follow-up: Return for After surgery.   Subjective:  Chief Complaint  Patient presents with   Results    EMG    History of Present Illness: Kathryn Woods is a 73 y.o. female who returns to clinic for repeat evaluation of left hand pain.  She has numbness and tingling within the median nerve distribution.  This has been ongoing for a while.  It is progressively worsening.  It is causing a lot of discomfort.  She has obtained EMGs, and is here to discuss the findings.  Review of Systems: No fevers or chills + numbness or tingling No chest pain No shortness of breath No bowel or bladder dysfunction No GI distress No headaches   Objective: There were no vitals taken for this visit.  Physical Exam:  Left hand without deformity.  No atrophy.  Decreased sensation within the median nerve distribution.  Positive Tinel's.  Positive Phalen's.  Positive carpal tunnel compression.  Fingers are warm and well-perfused.  IMAGING: I personally ordered and reviewed the following images:  EMG of the left upper extremity  electrodiagnostic study is ABNORMAL and reveals evidence of a moderate left median nerve entrapment at the wrist  (carpal tunnel syndrome) affecting sensory and motor components.    [name], MD 02/13/2024 10:44 AM

## 2024-02-13 NOTE — Patient Instructions (Signed)
 Preoperative Instructions  Your surgery will be at [facility], scheduled with Dr Oneil Horde   The hospital will contact you with a preoperative appointment to discuss Anesthesia. The phone number is [phone]   Please bring your medications with you for the appointment.  They will tell you the arrival time and medication instructions when you have your preoperative evaluation.  Do not wear nail polish the day of your surgery and if you take Phentermine you need to stop this medication ONE WEEK prior to your surgery.    If you take an blood thinning medication, we will need to stop this prior to surgery.  Typically, we stop this medicine at least 5 days prior to surgery.  We will need to confirm this with the doctor who prescribes this medication.  If you are taking medications or an injection for diabetes, or for weight management, this medicine will need to be stopped at least 7 days prior to surgery.     Surgery will be scheduled for 03/08/2024 pending authorization by your insurance company.   Pain medicine policy:  Per [facility] clinic policy, our goal is ensure optimal postoperative pain control with a multimodal pain management strategy.   For all [facility] patients, our goal is to wean post-operative narcotic medications by 6 weeks post-operatively.   If this is not possible due to utilization of pain medication prior to surgery, your [facility] doctor will support your acute post-operative pain control for the first 6 weeks postoperatively, with a plan to transition you back to your primary pain team following that.   [facility] will work to ensure a therapist, occupational.

## 2024-02-13 NOTE — Progress Notes (Signed)
 Orthopaedic Clinic Return  Assessment: Sudiksha Victor is a 73 y.o. female with the following: Moderate left carpal tunnel syndrome  Plan: Mrs. Colon has symptoms consistent with carpal tunnel syndrome.  She has numbness, tingling and shooting pains in the left hand.  EMG demonstrates moderate compression of the median nerve.  We discussed proceeding with surgery.  She is interested in carpal tunnel release, and we will work to schedule surgery for March 08, 2024.  Risks and benefits of the surgery, including, but not limited to infection, bleeding, persistent pain, need for further surgery, blood clots and more severe complications associated with anesthesia were discussed with the patient.  The patient has elected to proceed.    Follow-up: Return for After surgery.   Subjective:  Chief Complaint  Patient presents with   Results    EMG    History of Present Illness: Mareena Cavan is a 73 y.o. female who returns to clinic for repeat evaluation of left hand pain.  She has numbness and tingling within the median nerve distribution.  This has been ongoing for a while.  It is progressively worsening.  It is causing a lot of discomfort.  She has obtained EMGs, and is here to discuss the findings.  Review of Systems: No fevers or chills + numbness or tingling No chest pain No shortness of breath No bowel or bladder dysfunction No GI distress No headaches   Objective: There were no vitals taken for this visit.  Physical Exam:  Left hand without deformity.  No atrophy.  Decreased sensation within the median nerve distribution.  Positive Tinel's.  Positive Phalen's.  Positive carpal tunnel compression.  Fingers are warm and well-perfused.  IMAGING: I personally ordered and reviewed the following images:  EMG of the left upper extremity  electrodiagnostic study is ABNORMAL and reveals evidence of a moderate left median nerve entrapment at the wrist  (carpal tunnel syndrome) affecting sensory and motor components.    [name], MD 02/13/2024 10:44 AM

## 2024-02-16 ENCOUNTER — Ambulatory Visit (INDEPENDENT_AMBULATORY_CARE_PROVIDER_SITE_OTHER): Admitting: Orthopedic Surgery

## 2024-02-16 ENCOUNTER — Encounter: Payer: Self-pay | Admitting: Orthopedic Surgery

## 2024-02-16 ENCOUNTER — Telehealth: Payer: Self-pay | Admitting: Orthopedic Surgery

## 2024-02-16 ENCOUNTER — Other Ambulatory Visit: Payer: Self-pay | Admitting: Orthopedic Surgery

## 2024-02-16 DIAGNOSIS — M1711 Unilateral primary osteoarthritis, right knee: Secondary | ICD-10-CM | POA: Diagnosis not present

## 2024-02-16 DIAGNOSIS — G8929 Other chronic pain: Secondary | ICD-10-CM

## 2024-02-16 MED ORDER — CELECOXIB 200 MG PO CAPS: 200.0000 mg | ORAL_CAPSULE | Freq: Two times a day (BID) | ORAL | 0 refills | Status: DC

## 2024-02-16 NOTE — Telephone Encounter (Signed)
 Dr. Areatha pt - spoke w/the pt, she stated she was here today and Dr. VEAR was going to call something in for her and the pharmacy doesn't have anything.  [facility]

## 2024-02-16 NOTE — Progress Notes (Signed)
  Follow-up visit  02/16/2024   Chief Complaint  Patient presents with   Knee Pain    Right    Results    Here to review MRI right knee    Encounter Diagnoses  Name Primary?   Chronic pain of right knee Yes   Primary osteoarthritis of right knee     Assessment and plan  Progressively worsening disease on x-ray and MRI  Treatment options presented to the patient with the goal of shared decision making  Cortisone injection Physical therapy anti-inflammatories Tylenol  topical medications bracing Total knee arthroplasty  Kathryn Woods has opted not to have total knee arthroplasty at this time she will let us  know when she is ready for surgery  She would like something for pain and indicated that tramadol  does not work and causes her to have upset stomach she has an allergy to aspirin  naproxen  and verapamil  As noted in the orthopedic best practices literature and other literature opioids are not indicated for arthritis pain  This limits as to exercising Tylenol  500 mg Q6 and a different attempt at anti-inflammatory medication  I will discuss this with her via MyChart   History of present illness   73 year old female with valgus osteoarthritis right knee comes in with medial knee pain   Patient is having difficulty with her right knee with severe intermittent not constant nonweightbearing pain seems to bother her the most after she does activity and then sits down.   She said and I I have to manipulate the knee into position to get relief.  It seems like when I sit down after activities when I get the pain  In 2020 she had an arthroscopy of the right knee for torn lateral meniscus and was found to have osteoarthritis  Operative findings included normal medial compartment grade 4 chondral changes on the tibia and femur with a tear of the lateral meniscus and grade II chondromalacia of the patella  The most recent plain film X-ray shows increased valgus alignment with  worsening narrowing of the lateral compartment.  There are osteophyte surrounding all 3 joint areas.  There is a posterior osteophyte on the tibia there is an osteophyte at the superior aspect of the patella there are periarticular osteophytes around the medial and lateral femur.  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 Mild degenerative edema.   Chronic degenerative injury versus prior meniscectomy to a diminutive lateral meniscus.   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 No significant degenerative edema.   Moderate joint effusion.   Focal thickening to the medial plica best seen on the axial sequence.   Electronically signed by: Reyes Frees MD 01/29/2024 01:11 PM EDT RP Workstation: [id number]

## 2024-02-16 NOTE — Progress Notes (Signed)
    02/16/2024   Chief Complaint  Patient presents with   Knee Pain    Right    Results    Here to review MRI right knee    No diagnosis found.  What pharmacy do you use ? ______[facility]__________________  DOI/DOS/ Date:    Did you get better, worse or no change (Answer below)   Unchanged

## 2024-02-17 ENCOUNTER — Other Ambulatory Visit: Payer: Self-pay

## 2024-02-17 DIAGNOSIS — G5602 Carpal tunnel syndrome, left upper limb: Secondary | ICD-10-CM

## 2024-02-25 ENCOUNTER — Other Ambulatory Visit: Payer: Self-pay

## 2024-02-27 NOTE — Patient Instructions (Signed)
 Kathryn Woods  02/27/2024     @PREFPERIOPPHARMACY @   Your procedure is scheduled on 03/08/2024.   Report to [facility] at  1000 A.M.   Call this number if you have problems the morning of surgery:  [phone]  If you experience any cold or flu symptoms such as cough, fever, chills, shortness of breath, etc. between now and your scheduled surgery, please notify us  at the above number.   Remember:  Do not eat after midnight.   You may drink clear liquids until 0800 am on 03/08/2024.     Clear liquids allowed are:                    Water , Carbonated beverages (diabetics please choose diet or no sugar options), Black Coffee Only (No creamer, milk or cream, including half & half and powdered creamer), and Clear Sports drink (No red color; diabetics please choose diet or no sugar options)    Take these medicines the morning of surgery with A SIP OF WATER                                                    amlodipine.    Do not wear jewelry, make-up or nail polish, including gel polish,  artificial nails, or any other type of covering on natural nails (fingers and  toes).  Do not wear lotions, powders, or perfumes, or deodorant.  Do not shave 48 hours prior to surgery.  Men may shave face and neck.  Do not bring valuables to the hospital.  [facility] is not responsible for any belongings or valuables.  Contacts, dentures or bridgework may not be worn into surgery.  Leave your suitcase in the car.  After surgery it may be brought to your room.  For patients admitted to the hospital, discharge time will be determined by your treatment team.  Patients discharged the day of surgery will not be allowed to drive home and must have someone with them for 24 hours.   Name and phone number of your driver:   *** Special instructions:  ***  Please read over the following fact sheets that you were given. {OR PAT FACT SHEETS GIVEN:20460}       Open Carpal  Tunnel Release: What to Know After After open carpal tunnel release surgery, it's common to have pain, swelling, bruising, and stiffness in the wrist. Follow these instructions at home: Medicines Take your medicines only as told. You may need to take steps to help treat or prevent trouble pooping (constipation), such as: Taking medicines to help you poop. Eating foods high in fiber, like beans, whole grains, and fresh fruits and vegetables. Drinking more fluids as told. If you have a splint or brace that can be taken off: Wear the splint or brace as told. Take it off only if your health care provider says you can. Check the skin around it every day. Tell your provider if you see problems. Loosen the splint or brace if your fingers tingle, are numb, or turn cold and blue. Keep the splint or brace clean. If the splint or brace isn't waterproof: Do not let it get wet. Cover it when you take a bath or shower. Use a cover that doesn't let any water  in. Bathing Do not take  baths, swim, or use a hot tub until you're told it's OK. Ask if you can shower. Keep your bandage dry until your provider says it can be removed. Cover it when you take a bath or shower. Use a cover that doesn't let any water  in. Wound care  Take care of your cut from surgery as told. Make sure you: Wash your hands with soap and water  for at least 20 seconds before and after you change your bandage. If you can't use soap and water , use hand sanitizer. Change your bandage. Leave stitches or skin glue alone. Leave tape strips alone unless you're told to take them off. You may trim the edges of the tape strips if they curl up. Check the area around your cut from surgery every day for signs of infection. Check for: Redness, swelling, or pain. Fluid or blood. Warmth. Pus or a bad smell. Pain Management  Use ice or an ice pack as told. If you have a splint or brace that you can take off, remove it only as told. Place a  towel between your skin and the ice. Leave the ice on for 20 minutes, 2-3 times a day. If your skin turns red, take off the ice right away to prevent skin damage. The risk of damage is higher if you can't feel pain, heat, or cold. Move your fingers often to avoid stiffness and swelling. Raise your wrist above the level of your heart while you're sitting or lying down. Use pillows as needed. Activity Ask when it's safe to drive if you have a splint or brace on your hand. Do not lift with your affected hand until you're told it's OK. Avoid pulling and pushing with the injured arm. Do exercises as told. Ask what things are safe for you to do at home. Ask when you can go back to work or school. General instructions Do not smoke, vape or use nicotine or tobacco. Doing this can slow down healing. Keep all follow-up visits. You'll need to have your hand checked after surgery. Your provider may give you more instructions. Make sure you know what you can and can't do. Contact a health care provider if: You have signs of infection. You have a fever. You have pain that doesn't get better with medicine. Your carpal tunnel symptoms don't go away after 2 months. Your carpal tunnel symptoms go away and then come back. You have numbness that's getting worse. Get help right away if: Your fingers or hand turn cool or pale, or they change color. They may turn blue or grey. You aren't able to move your fingers. You lose feeling in your fingers, hand, or arm. These symptoms may be an emergency. Call 911 right away. Do not wait to see if the symptoms will go away. Do not drive yourself to the hospital. This information is not intended to replace advice given to you by your health care provider. Make sure you discuss any questions you have with your health care provider. Document Revised: 12/23/2022 Document Reviewed: 12/23/2022 Elsevier Patient Education  2024 Elsevier Inc.General Anesthesia, Adult, Care  After The following information offers guidance on how to care for yourself after your procedure. Your health care provider may also give you more specific instructions. If you have problems or questions, contact your health care provider. What can I expect after the procedure? After the procedure, it is common for people to: Have pain or discomfort at the IV site. Have nausea or vomiting. Have a sore throat  or hoarseness. Have trouble concentrating. Feel cold or chills. Feel weak, sleepy, or tired (fatigue). Have soreness and body aches. These can affect parts of the body that were not involved in surgery. Follow these instructions at home: For the time period you were told by your health care provider:  Rest. Do not participate in activities where you could fall or become injured. Do not drive or use machinery. Do not drink alcohol. Do not take sleeping pills or medicines that cause drowsiness. Do not make important decisions or sign legal documents. Do not take care of children on your own. General instructions Drink enough fluid to keep your urine pale yellow. If you have sleep apnea, surgery and certain medicines can increase your risk for breathing problems. Follow instructions from your health care provider about wearing your sleep device: Anytime you are sleeping, including during daytime naps. While taking prescription pain medicines, sleeping medicines, or medicines that make you drowsy. Return to your normal activities as told by your health care provider. Ask your health care provider what activities are safe for you. Take over-the-counter and prescription medicines only as told by your health care provider. Do not use any products that contain nicotine or tobacco. These products include cigarettes, chewing tobacco, and vaping devices, such as e-cigarettes. These can delay incision healing after surgery. If you need help quitting, ask your health care provider. Contact a  health care provider if: You have nausea or vomiting that does not get better with medicine. You vomit every time you eat or drink. You have pain that does not get better with medicine. You cannot urinate or have bloody urine. You develop a skin rash. You have a fever. Get help right away if: You have trouble breathing. You have chest pain. You vomit blood. These symptoms may be an emergency. Get help right away. Call 911. Do not wait to see if the symptoms will go away. Do not drive yourself to the hospital. Summary After the procedure, it is common to have a sore throat, hoarseness, nausea, vomiting, or to feel weak, sleepy, or fatigue. For the time period you were told by your health care provider, do not drive or use machinery. Get help right away if you have difficulty breathing, have chest pain, or vomit blood. These symptoms may be an emergency. This information is not intended to replace advice given to you by your health care provider. Make sure you discuss any questions you have with your health care provider. Document Revised: 06/22/2021 Document Reviewed: 06/22/2021 Elsevier Patient Education  2024 Elsevier Inc. How to Use Chlorhexidine  at Home in the Shower Chlorhexidine  gluconate (CHG) is a germ-killing (antiseptic) wash that's used to clean the skin. It can get rid of the germs that normally live on the skin and can keep them away for about 24 hours. If you're having surgery, you may be told to shower with CHG at home the night before surgery. This can help lower your risk for infection. To use CHG wash in the shower, follow the steps below. Supplies needed: CHG body wash. Clean washcloth. Clean towel. How to use CHG in the shower Follow these steps unless you're told to use CHG in a different way: Start the shower. Use your normal soap and shampoo to wash your face and hair. Turn off the shower or move out of the shower stream. Pour CHG onto a clean washcloth. Do  not use any type of brush or rough sponge. Start at your neck, washing your body  down to your toes. Make sure you: Wash the part of your body where the surgery will be done for at least 1 minute. Do not scrub. Do not use CHG on your head or face unless your health care provider tells you to. If it gets into your ears or eyes, rinse them well with water . Do not wash your genitals with CHG. Wash your back and under your arms. Make sure to wash skin folds. Let the CHG sit on your skin for 1-2 minutes or as long as told. Rinse your entire body in the shower, including all body creases and folds. Turn off the shower. Dry off with a clean towel. Do not put anything on your skin afterward, such as powder, lotion, or perfume. Put on clean clothes or pajamas. If it's the night before surgery, sleep in clean sheets. General tips Use CHG only as told, and follow the instructions on the label. Use the full amount of CHG as told. This is often one bottle. Do not smoke and stay away from flames after using CHG. Your skin may feel sticky after using CHG. This is normal. The sticky feeling will go away as the CHG dries. Do not use CHG: If you have a chlorhexidine  allergy or have reacted to chlorhexidine  in the past. On open wounds or areas of skin that have broken skin, cuts, or scrapes. On babies younger than 73 months of age. Contact a health care provider if: You have questions about using CHG. Your skin gets irritated or itchy. You have a rash after using CHG. You swallow any CHG. Call your local poison control center [phone] in the [place] Your eyes itch badly, or they become very red or swollen. Your hearing changes. You have trouble seeing. If you can't reach your provider, go to an urgent care or emergency room. Do not drive yourself. Get help right away if: You have swelling or tingling in your mouth or throat. You make high-pitched whistling sounds when you breathe, most often when you  breathe out (wheeze). You have trouble breathing. These symptoms may be an emergency. Call 911 right away. Do not wait to see if the symptoms will go away. Do not drive yourself to the hospital. This information is not intended to replace advice given to you by your health care provider. Make sure you discuss any questions you have with your health care provider. Document Revised: 10/08/2022 Document Reviewed: 10/04/2021 Elsevier Patient Education  2024 Elsevier Inc.How to Use an Incentive Spirometer An incentive spirometer is a tool that measures how well you are filling your lungs with each breath. Learning to take long, deep breaths using this tool can help you keep your lungs clear and active. This may help to reverse or lessen your chance of developing breathing (pulmonary) problems, especially infection. You may be asked to use a spirometer: After a surgery. If you have a lung problem or a history of smoking. After a long period of time when you have been unable to move or be active. If the spirometer includes an indicator to show the highest number that you have reached, your health care provider or respiratory therapist will help you set a goal. Keep a log of your progress as told by your health care provider. What are the risks? Breathing too quickly may cause dizziness or cause you to pass out. Take your time so you do not get dizzy or light-headed. If you are in pain, you may need to take pain medicine before  doing incentive spirometry. It is harder to take a deep breath if you are having pain. How to use your incentive spirometer  Sit up on the edge of your bed or on a chair. Hold the incentive spirometer so that it is in an upright position. Before you use the spirometer, breathe out normally. Place the mouthpiece in your mouth. Make sure your lips are closed tightly around it. Breathe in slowly and as deeply as you can through your mouth, causing the piston or the ball to rise  toward the top of the chamber. Hold your breath for 3-5 seconds, or for as long as possible. If the spirometer includes a coach indicator, use this to guide you in breathing. Slow down your breathing if the indicator goes above the marked areas. Remove the mouthpiece from your mouth and breathe out normally. The piston or ball will return to the bottom of the chamber. Rest for a few seconds, then repeat the steps 10 or more times. Take your time and take a few normal breaths between deep breaths so that you do not get dizzy or light-headed. Do this every 1-2 hours when you are awake. If the spirometer includes a goal marker to show the highest number you have reached (best effort), use this as a goal to work toward during each repetition. After each set of 10 deep breaths, cough a few times. This will help to make sure that your lungs are clear. If you have an incision on your chest or abdomen from surgery, place a pillow or a rolled-up towel firmly against the incision when you cough. This can help to reduce pain while taking deep breaths and coughing. General tips When you are able to get out of bed: Walk around often. Continue to take deep breaths and cough in order to clear your lungs. Keep using the incentive spirometer until your health care provider says it is okay to stop using it. If you have been in the hospital, you may be told to keep using the spirometer at home. Contact a health care provider if: You are having difficulty using the spirometer. You have trouble using the spirometer as often as instructed. Your pain medicine is not giving enough relief for you to use the spirometer as told. You have a fever. Get help right away if: You develop shortness of breath. You develop a cough with bloody mucus from the lungs. You have fluid or blood coming from an incision site after you cough. Summary An incentive spirometer is a tool that can help you learn to take long, deep breaths to  keep your lungs clear and active. You may be asked to use a spirometer after a surgery, if you have a lung problem or a history of smoking, or if you have been inactive for a long period of time. Use your incentive spirometer as instructed every 1-2 hours while you are awake. If you have an incision on your chest or abdomen, place a pillow or a rolled-up towel firmly against your incision when you cough. This will help to reduce pain. Get help right away if you have shortness of breath, you cough up bloody mucus, or blood comes from your incision when you cough. This information is not intended to replace advice given to you by your health care provider. Make sure you discuss any questions you have with your health care provider. Document Revised: 01/31/2023 Document Reviewed: 01/31/2023 Elsevier Patient Education  2024 [employer].

## 2024-03-02 ENCOUNTER — Encounter (HOSPITAL_COMMUNITY): Payer: Self-pay

## 2024-03-02 ENCOUNTER — Encounter (HOSPITAL_COMMUNITY)
Admission: RE | Admit: 2024-03-02 | Discharge: 2024-03-02 | Disposition: A | Discharge status: Home / Self Care | Source: Ambulatory Visit | Attending: Orthopedic Surgery | Admitting: Orthopedic Surgery

## 2024-03-02 VITALS — BP 140/92 | HR 68 | Temp 98.3°F | Resp 20 | Ht 66.0 in | Wt 162.0 lb

## 2024-03-02 DIAGNOSIS — Z01810 Encounter for preprocedural cardiovascular examination: Secondary | ICD-10-CM | POA: Insufficient documentation

## 2024-03-02 DIAGNOSIS — I10 Essential (primary) hypertension: Secondary | ICD-10-CM | POA: Diagnosis not present

## 2024-03-02 HISTORY — DX: Essential (primary) hypertension: I10

## 2024-03-08 ENCOUNTER — Ambulatory Visit (HOSPITAL_COMMUNITY): Admitting: Certified Registered"

## 2024-03-08 ENCOUNTER — Encounter (HOSPITAL_COMMUNITY): Payer: Self-pay | Admitting: Orthopedic Surgery

## 2024-03-08 ENCOUNTER — Other Ambulatory Visit: Payer: Self-pay

## 2024-03-08 ENCOUNTER — Ambulatory Visit (HOSPITAL_COMMUNITY)
Admission: RE | Admit: 2024-03-08 | Discharge: 2024-03-08 | Disposition: A | Discharge status: Home / Self Care | Attending: Orthopedic Surgery | Admitting: Orthopedic Surgery

## 2024-03-08 ENCOUNTER — Encounter (HOSPITAL_COMMUNITY)
Admission: RE | Disposition: A | Discharge status: Home / Self Care | Payer: Self-pay | Source: Home / Self Care | Attending: Orthopedic Surgery

## 2024-03-08 DIAGNOSIS — G5602 Carpal tunnel syndrome, left upper limb: Secondary | ICD-10-CM

## 2024-03-08 HISTORY — PX: CARPAL TUNNEL RELEASE: SHX101

## 2024-03-08 SURGERY — CARPAL TUNNEL RELEASE
Anesthesia: General | Site: Hand | Laterality: Left

## 2024-03-08 MED ORDER — FENTANYL CITRATE (PF) 100 MCG/2ML IJ SOLN
INTRAMUSCULAR | Status: DC | PRN
  Administered 2024-03-08 (×2): 25 ug via INTRAVENOUS

## 2024-03-08 MED ORDER — ONDANSETRON HCL 4 MG/2ML IJ SOLN
INTRAMUSCULAR | Status: AC
  Filled 2024-03-08: qty 2

## 2024-03-08 MED ORDER — CEFAZOLIN SODIUM-DEXTROSE 2-4 GM/100ML-% IV SOLN
INTRAVENOUS | Status: AC
  Filled 2024-03-08: qty 100

## 2024-03-08 MED ORDER — OXYCODONE HCL 5 MG/5ML PO SOLN: 5.0000 mg | Freq: Once | ORAL | Status: DC | PRN

## 2024-03-08 MED ORDER — BUPIVACAINE HCL (PF) 0.5 % IJ SOLN
INTRAMUSCULAR | Status: AC
  Filled 2024-03-08: qty 30

## 2024-03-08 MED ORDER — LIDOCAINE 2% (20 MG/ML) 5 ML SYRINGE
INTRAMUSCULAR | Status: DC | PRN
  Administered 2024-03-08: 100 mg via INTRAVENOUS

## 2024-03-08 MED ORDER — ONDANSETRON HCL 4 MG PO TABS: 4.0000 mg | ORAL_TABLET | Freq: Three times a day (TID) | ORAL | 0 refills | Status: AC | PRN

## 2024-03-08 MED ORDER — FENTANYL CITRATE (PF) 100 MCG/2ML IJ SOLN
INTRAMUSCULAR | Status: AC
  Filled 2024-03-08: qty 2

## 2024-03-08 MED ORDER — LIDOCAINE HCL 1 % IJ SOLN
INTRAMUSCULAR | Status: DC | PRN
  Administered 2024-03-08: 7 mL via INTRADERMAL
  Administered 2024-03-08: 10 mL via INTRADERMAL

## 2024-03-08 MED ORDER — DEXMEDETOMIDINE HCL IN NACL 80 MCG/20ML IV SOLN
INTRAVENOUS | Status: DC | PRN
  Administered 2024-03-08: 4 ug via INTRAVENOUS

## 2024-03-08 MED ORDER — DEXMEDETOMIDINE HCL IN NACL 80 MCG/20ML IV SOLN
INTRAVENOUS | Status: AC
  Filled 2024-03-08: qty 20

## 2024-03-08 MED ORDER — LIDOCAINE HCL (PF) 1 % IJ SOLN
INTRAMUSCULAR | Status: AC
  Filled 2024-03-08: qty 30

## 2024-03-08 MED ORDER — PROPOFOL 500 MG/50ML IV EMUL
INTRAVENOUS | Status: DC | PRN
  Administered 2024-03-08: 40 mg via INTRAVENOUS
  Administered 2024-03-08: 75 ug/kg/min via INTRAVENOUS

## 2024-03-08 MED ORDER — OXYCODONE HCL 5 MG PO TABS: 5.0000 mg | ORAL_TABLET | Freq: Once | ORAL | Status: DC | PRN

## 2024-03-08 MED ORDER — LACTATED RINGERS IV SOLN: INTRAVENOUS | Status: DC

## 2024-03-08 MED ORDER — ONDANSETRON HCL 4 MG/2ML IJ SOLN
INTRAMUSCULAR | Status: DC | PRN
  Administered 2024-03-08: 4 mg via INTRAVENOUS

## 2024-03-08 MED ORDER — 0.9 % SODIUM CHLORIDE (POUR BTL) OPTIME
TOPICAL | Status: DC | PRN
  Administered 2024-03-08: 1000 mL

## 2024-03-08 MED ORDER — CHLORHEXIDINE GLUCONATE 0.12 % MT SOLN: 15.0000 mL | Freq: Once | OROMUCOSAL | Status: DC

## 2024-03-08 MED ORDER — CEFAZOLIN SODIUM-DEXTROSE 2-4 GM/100ML-% IV SOLN
2.0000 g | INTRAVENOUS | Status: AC
  Administered 2024-03-08: 2 g via INTRAVENOUS

## 2024-03-08 MED ORDER — LACTATED RINGERS IV SOLN: INTRAVENOUS | Status: DC | PRN

## 2024-03-08 MED ORDER — HYDROCODONE-ACETAMINOPHEN 5-325 MG PO TABS: 1.0000 | ORAL_TABLET | Freq: Four times a day (QID) | ORAL | 0 refills | Status: AC | PRN

## 2024-03-08 MED ORDER — ORAL CARE MOUTH RINSE: 15.0000 mL | Freq: Once | OROMUCOSAL | Status: DC

## 2024-03-08 SURGICAL SUPPLY — 30 items
BANDAGE ESMARK 4X12 BL STRL LF (DISPOSABLE) ×1 IMPLANT
BLADE SURG 15 STRL LF DISP TIS (BLADE) ×1 IMPLANT
BNDG COHESIVE 4X5 TAN STRL LF (GAUZE/BANDAGES/DRESSINGS) ×1 IMPLANT
BNDG ELASTIC 3X5.8 VLCR NS LF (GAUZE/BANDAGES/DRESSINGS) ×1 IMPLANT
BNDG GAUZE DERMACEA FLUFF 4 (GAUZE/BANDAGES/DRESSINGS) ×1 IMPLANT
CHLORAPREP W/TINT 26 (MISCELLANEOUS) ×1 IMPLANT
CLOTH BEACON ORANGE TIMEOUT ST (SAFETY) ×1 IMPLANT
CORD BIPOLAR FORCEPS 12FT (ELECTRODE) ×1 IMPLANT
COVER LIGHT HANDLE STERIS (MISCELLANEOUS) ×2 IMPLANT
CUFF TOURN SGL QUICK 18X4 (TOURNIQUET CUFF) ×1 IMPLANT
DRAPE HALF SHEET 40X57 (DRAPES) ×1 IMPLANT
GAUZE SPONGE 4X4 12PLY STRL (GAUZE/BANDAGES/DRESSINGS) ×1 IMPLANT
GAUZE XEROFORM 1X8 LF (GAUZE/BANDAGES/DRESSINGS) ×1 IMPLANT
GLOVE BIO SURGEON STRL SZ8.5 (GLOVE) ×2 IMPLANT
GLOVE BIOGEL PI IND STRL 7.0 (GLOVE) ×2 IMPLANT
GLOVE BIOGEL PI IND STRL 8.5 (GLOVE) ×1 IMPLANT
GOWN STRL REUS W/TWL LRG LVL3 (GOWN DISPOSABLE) ×1 IMPLANT
GOWN STRL REUS W/TWL XL LVL3 (GOWN DISPOSABLE) ×1 IMPLANT
KIT TURNOVER KIT A (KITS) ×1 IMPLANT
MANIFOLD NEPTUNE II (INSTRUMENTS) ×1 IMPLANT
NDL HYPO 21X1.5 SAFETY (NEEDLE) ×1 IMPLANT
PACK BASIC LIMB (CUSTOM PROCEDURE TRAY) ×1 IMPLANT
PAD ARMBOARD POSITIONER FOAM (MISCELLANEOUS) ×1 IMPLANT
POSITIONER HAND ALUMI XLG (MISCELLANEOUS) ×1 IMPLANT
POSITIONER HEAD 8X9X4 ADT (SOFTGOODS) ×1 IMPLANT
SET BASIN LINEN APH (SET/KITS/TRAYS/PACK) ×1 IMPLANT
SOLN 0.9% NACL POUR BTL 1000ML (IV SOLUTION) ×1 IMPLANT
SUT PROLENE NAB BLUE 3-0 30IN (SUTURE) ×1 IMPLANT
SYR CONTROL 10ML LL (SYRINGE) ×1 IMPLANT
UNDERPAD 30X36 HEAVY ABSORB (UNDERPADS AND DIAPERS) ×1 IMPLANT

## 2024-03-08 NOTE — Anesthesia Procedure Notes (Signed)
 Date/Time: 03/08/2024 12:51 PM  Performed by: Para Jerelene CROME, CRNAOxygen Delivery Method: Nasal cannula

## 2024-03-08 NOTE — Anesthesia Postprocedure Evaluation (Signed)
 Anesthesia Post Note  Patient: Kathryn Woods  Procedure(s) Performed: CARPAL TUNNEL RELEASE (Left: Hand)  Patient location during evaluation: PACU Anesthesia Type: General Level of consciousness: awake and alert Pain management: pain level controlled Vital Signs Assessment: post-procedure vital signs reviewed and stable Respiratory status: spontaneous breathing, nonlabored ventilation, respiratory function stable and patient connected to nasal cannula oxygen Cardiovascular status: blood pressure returned to baseline and stable Postop Assessment: no apparent nausea or vomiting Anesthetic complications: no   No notable events documented.   Last Vitals:  Vitals:   03/08/24 1340  BP: 127/86  Resp: 12  Temp: 36.8 C  SpO2: 95%    Last Pain:  Vitals:   03/08/24 1340  PainSc: Asleep                 Andrea Limes

## 2024-03-08 NOTE — Anesthesia Preprocedure Evaluation (Addendum)
 Anesthesia Evaluation  Patient identified by MRN, date of birth, ID band Patient awake    Reviewed: Allergy & Precautions, H&P , NPO status , Patient's Chart, lab work & pertinent test results  Airway Mallampati: II  TM Distance: >3 FB Neck ROM: Full    Dental  (+) Edentulous Upper, Edentulous Lower   Pulmonary former smoker   Pulmonary exam normal breath sounds clear to auscultation       Cardiovascular hypertension, Normal cardiovascular exam Rhythm:Regular Rate:Normal     Neuro/Psych  Headaches  negative psych ROS   GI/Hepatic Neg liver ROS,GERD  ,,  Endo/Other  negative endocrine ROS    Renal/GU negative Renal ROS  negative genitourinary   Musculoskeletal  (+) Arthritis ,    Abdominal   Peds negative pediatric ROS (+)  Hematology negative hematology ROS (+)   Anesthesia Other Findings   Reproductive/Obstetrics negative OB ROS                              Anesthesia Physical Anesthesia Plan  ASA: 1  Anesthesia Plan: General   Post-op Pain Management:    Induction: Intravenous  PONV Risk Score and Plan:   Airway Management Planned: LMA  Additional Equipment:   Intra-op Plan:   Post-operative Plan: Extubation in OR  Informed Consent: I have reviewed the patients History and Physical, chart, labs and discussed the procedure including the risks, benefits and alternatives for the proposed anesthesia with the patient or authorized representative who has indicated his/her understanding and acceptance.     Dental advisory given  Plan Discussed with: CRNA  Anesthesia Plan Comments:          Anesthesia Quick Evaluation

## 2024-03-08 NOTE — Interval H&P Note (Signed)
 History and Physical Interval Note:  03/08/2024 12:23 PM  Kathryn Woods  has presented today for surgery, with the diagnosis of Left carpal tunnel syndrome.  The various methods of treatment have been discussed with the patient and family. After consideration of risks, benefits and other options for treatment, the patient has consented to  Procedure(s): CARPAL TUNNEL RELEASE (Left) as a surgical intervention.  The patient's history has been reviewed, patient examined, no change in status, stable for surgery.  I have reviewed the patient's chart and labs.  Questions were answered to the patient's satisfaction.     [name]

## 2024-03-08 NOTE — Discharge Instructions (Signed)
  Brya Simerly A. Onesimo, MD MS [facility] [address] [place],  [place]  [address] Phone: [phone] Fax: [phone]    POST-OPERATIVE INSTRUCTIONS   WOUND CARE You may remove your bandage on postop day 3 and get the hand wet.  No ointments or lotions to be applied to the incision.  Do not submerge the incision for 1 month.  FOLLOW-UP If you develop a Fever (>101.5), Redness or Drainage from the surgical incision site, please call our office to arrange for an evaluation. Please call the office to schedule a follow-up appointment for your incision check if you do not already have one, 7-10 days post-operatively.  IF YOU HAVE ANY QUESTIONS, PLEASE FEEL FREE TO CALL OUR OFFICE.  HELPFUL INFORMATION  You should wean off your narcotic medicines as soon as you are able.  Most patients will be off or using minimal narcotics before their first postop appointment.   You may be more comfortable sleeping in a semi-seated position the first few nights following surgery.  Keep a pillow propped under the elbow and forearm for comfort.  If you have a recliner type of chair it might be beneficial.    We suggest you use the pain medication the first night prior to going to bed, in order to ease any pain when the anesthesia wears off. You should avoid taking pain medications on an empty stomach as it will make you nauseous.  Do not drink alcoholic beverages or take illicit drugs when taking pain medications.  You may return to work/school in the next couple of days when you feel up to it. Desk work and typing is fine.  Pain medication may make you constipated.  Below are a few solutions to try in this order: Decrease the amount of pain medication if you aren't having pain. Drink lots of decaffeinated fluids. Drink prune juice and/or each dried prunes  If the first 3 don't work start with additional solutions Take Colace - an over-the-counter stool softener Take  Senokot - an over-the-counter laxative Take Miralax  - a stronger over-the-counter laxative   Narcotic Management  Per [facility] clinic policy, our goal is ensure optimal postoperative pain control with a multimodal pain management strategy.   For all [facility] patients, our goal is to wean post-operative narcotic medications by 6 weeks post-operatively.  If this is not possible due to utilization of pain medication prior to surgery, your [facility] doctor will support your acute post-operative pain control for the first 6 weeks postoperatively, with a plan to transition you back to your primary pain team following that.   [facility]e will work to ensure a Therapist, occupational.

## 2024-03-08 NOTE — Op Note (Signed)
 Orthopaedic Surgery Operative Note (CSN: [id number])  Kathryn Woods  1950/11/18 Date of Surgery: 03/08/2024   Diagnoses:  Left carpal tunnel syndrome  Procedure: Left Open Carpal Tunnel Release   Operative Finding Successful completion of the planned procedure.     Post-Op Diagnosis: Same Surgeons:Primary: Onesimo Oneil LABOR, MD Assistants: None Location: [facility] OR ROOM 4 Anesthesia: Sedation plus regional anesthesia Antibiotics: Ancef  2 g Tourniquet time:  Total Tourniquet Time Documented: Upper Arm (Left) - 27 minutes Total: Upper Arm (Left) - 27 minutes  Estimated Blood Loss: 5 cc Complications: None Specimens: None   Implants:   Indications for Surgery:   Kathryn Woods is a 73 y.o. female with symptoms consistent with carpal tunnel syndrome.  Symptoms have been ongoing, and progressively worsening.  They have tried medications and bracing without improvement in symptoms.  EMG results demonstrate moderate carpal tunnel syndrome.  Risks and benefits of operative and nonoperative management were discussed prior to surgery with the patient and informed consent form was completed.  Specific risks including infection, need for additional surgery, bleeding, recurrent symptoms, incomplete resolution of symptoms, persistent pain and more severe complications associated with anesthesia were discussed.  All questions were answered.  Surgical consent was finalized.    Procedure:   The patient was identified properly. Informed consent was finalized and the surgical site was marked. The patient was taken to the OR where she was sedated.  The patient was positioned supine, on a hand table.  The left arm was prepped and draped in the usual sterile fashion.  Timeout was performed before the beginning of the case.  Tourniquet was used for the above duration.  Antibiotics were administered prior to making incision.  Local anesthetic was injected around the incision.  Pinch  test completed. Incision was made in line with the radial border of the ring finger. The carpal tunnel transverse fascia was identified, cleaned, and incised sharply. The common sensory branches were visualized along with the superficial palmar arch and protected.  The median nerve was protected below. Deep retractors were placed underneath the transverse carpal ligament, protecting the nerve. I released the ligament completely, and then released the distal volar forearm fascia. The nerve was identified, and visualized, and protected throughout the case. No masses or abnormalities were identified in ulnar bursa.  The wounds were irrigated copiously, and the wounds injected. Skin closed with interrupted sutures followed by a bulky dressing. Patient  tolerated this well, with no complications.   Post-operative plan:  The patient will be discharged home from the PACU. WBAT on the operative extremity; limit lifting to nothing more than a coffee cup until follow up appointment   DVT prophylaxis not indicated in this ambulatory upper extremity patient without significant risk factors.    Pain control with PRN pain medication preferring oral medicines.   Follow up plan will be scheduled in approximately 7-10 days for incision check

## 2024-03-08 NOTE — Transfer of Care (Addendum)
 Immediate Anesthesia Transfer of Care Note  Patient: Lenora Gomes Koziol  Procedure(s) Performed: CARPAL TUNNEL RELEASE (Left: Hand)  Patient Location: PACU  Anesthesia Type:MAC  Level of Consciousness: drowsy and patient cooperative  Airway & Oxygen Therapy: Patient Spontanous Breathing and Patient connected to nasal cannula oxygen  Post-op Assessment: Report given to RN and Post -op Vital signs reviewed and stable  Post vital signs: Reviewed and stable  Last Vitals:  Vitals Value Taken Time  BP 127/86 03/08/24 13:40  Temp 36.8 C 03/08/24 13:40  Pulse 66 03/08/24   13:40  Resp 12 03/08/24 13:40  SpO2 95 % 03/08/24 13:40    Last Pain:  Vitals:   03/08/24 1340  PainSc: Asleep         Complications: No notable events documented.

## 2024-03-09 ENCOUNTER — Encounter (HOSPITAL_COMMUNITY): Payer: Self-pay | Admitting: Orthopedic Surgery

## 2024-03-17 ENCOUNTER — Ambulatory Visit: Admitting: Orthopedic Surgery

## 2024-03-17 DIAGNOSIS — G5602 Carpal tunnel syndrome, left upper limb: Secondary | ICD-10-CM

## 2024-03-17 MED ORDER — IBUPROFEN 600 MG PO TABS: 600.0000 mg | ORAL_TABLET | Freq: Three times a day (TID) | ORAL | 0 refills | Status: AC | PRN

## 2024-03-17 NOTE — Progress Notes (Signed)
 Orthopaedic Postop Note  Assessment: Kathryn Woods is a 73 y.o. female s/p left open carpal tunnel release  DOS: 03/08/2024  Plan: Kathryn Woods has done well.  Notes almost complete resolution of symptoms.  Denies burning or shooting pains.  Some persistent tingling sensations in the median nerve distribution.  Surgical incision is healing well.  Sutures were removed, and Steri-Strips were placed.  Okay to return to work.  Keep incision covered.  Do not submerge the wound.  Return in 4 weeks.    Follow-up: Return in about 4 weeks (around 04/14/2024). XR at next visit: None  Subjective:  Chief Complaint  Patient presents with   Routine Post Op    L CTR DOS 03/08/24    History of Present Illness: Kathryn Woods is a 73 y.o. female who presents following the above stated procedure.  Surgery was approximately 2 weeks ago.  No issues.  Some pain for the first few days.  Burning and shooting pains have almost completely resolved.  Some numbness in the fingers.   No issues with the incision  Review of Systems: No fevers or chills Some numbness or tingling No Chest Pain No shortness of breath   Objective: There were no vitals taken for this visit.  Physical Exam:  Alert and oriented, no acute distress.  Surgical incision is healing well.  No surrounding erythema or drainage.  Mild tenderness to palpation about surgical site.  Sensation is intact in the median nerve distribution.  Able to make a full fist. No redness. 2+ radial pulse.   IMAGING: I personally ordered and reviewed the following images:  No new imaging obtained today  [name], MD 03/17/2024 3:51 PM

## 2024-03-19 ENCOUNTER — Encounter: Payer: Self-pay | Admitting: Orthopedic Surgery

## 2024-04-14 ENCOUNTER — Encounter: Payer: Self-pay | Admitting: Orthopedic Surgery

## 2024-04-14 ENCOUNTER — Ambulatory Visit: Admitting: Orthopedic Surgery

## 2024-04-14 ENCOUNTER — Telehealth: Payer: Self-pay | Admitting: Orthopedic Surgery

## 2024-04-14 DIAGNOSIS — G5602 Carpal tunnel syndrome, left upper limb: Secondary | ICD-10-CM

## 2024-04-14 DIAGNOSIS — G8929 Other chronic pain: Secondary | ICD-10-CM

## 2024-04-14 DIAGNOSIS — M1711 Unilateral primary osteoarthritis, right knee: Secondary | ICD-10-CM

## 2024-04-14 NOTE — Patient Instructions (Signed)
 Continue to massage around the incision to help with scar tissue buildup, as well as improve the sensitivity

## 2024-04-14 NOTE — Telephone Encounter (Signed)
 Patient was here to see Dr Onesimo for carpal tunnel  She went to a clinic she saw on TV about getting gel injections right knee They can do it but are out of network and is expensive She wants to know if you think gel injections would help her and if so if we can order for her right knee?

## 2024-04-14 NOTE — Progress Notes (Signed)
 Orthopaedic Postop Note  Assessment: Kathryn Woods is a 74 y.o. female s/p left open carpal tunnel release  DOS: 03/08/2024  Plan: Kathryn Woods is doing well following surgery.  She has no numbness and tingling in the median nerve distribution.  She notes some sensitivity around the incision.  She does have some tenderness in this area.  Provided reassurance.  Urged her to work on desensitizing around the incision.  She states understanding.  She will follow-up for her left hand as needed.  Follow-up: Return if symptoms worsen or fail to improve. XR at next visit: None  Subjective:  Chief Complaint  Patient presents with   Routine Post Op    L [facility] DOS: 03/08/2024    History of Present Illness: Kathryn Woods is a 74 y.o. female who presents following the above stated procedure.  Surgery was approximately 6 weeks ago.  She is doing well at this point.  She denies numbness and tingling.  She does have some tenderness around the incision.  The incision is still sensitive to touch.   Review of Systems: No fevers or chills No numbness or tingling No Chest Pain No shortness of breath   Objective: There were no vitals taken for this visit.  Physical Exam:  Alert and oriented, no acute distress.  Surgical incision is healing well.  No bruising in this area.  Some scar tissue is forming.  It is sensitive to touch. No surrounding erythema or drainage.  Sensation is intact in the median nerve distribution.  Able to make a full fist. No redness. 2+ radial pulse.   IMAGING: I personally ordered and reviewed the following images:  No new imaging obtained today  [name], MD 04/14/2024 2:28 PM
